# Patient Record
Sex: Male | Born: 1950 | Race: Black or African American | Hispanic: No | Marital: Married | Smoking: Former smoker
Health system: Southern US, Community
[De-identification: ages and names within clinical notes are randomized; demographics above are authoritative.]

## PROBLEM LIST (undated history)

## (undated) DIAGNOSIS — J849 Interstitial pulmonary disease, unspecified: Secondary | ICD-10-CM

## (undated) DIAGNOSIS — N529 Male erectile dysfunction, unspecified: Secondary | ICD-10-CM

## (undated) DIAGNOSIS — I10 Essential (primary) hypertension: Secondary | ICD-10-CM

## (undated) DIAGNOSIS — I255 Ischemic cardiomyopathy: Secondary | ICD-10-CM

## (undated) DIAGNOSIS — E7800 Pure hypercholesterolemia, unspecified: Secondary | ICD-10-CM

## (undated) DIAGNOSIS — K219 Gastro-esophageal reflux disease without esophagitis: Secondary | ICD-10-CM

## (undated) DIAGNOSIS — Z95810 Presence of automatic (implantable) cardiac defibrillator: Secondary | ICD-10-CM

## (undated) DIAGNOSIS — I5020 Unspecified systolic (congestive) heart failure: Secondary | ICD-10-CM

## (undated) DIAGNOSIS — I82409 Acute embolism and thrombosis of unspecified deep veins of unspecified lower extremity: Secondary | ICD-10-CM

## (undated) DIAGNOSIS — J45909 Unspecified asthma, uncomplicated: Secondary | ICD-10-CM

## (undated) DIAGNOSIS — I241 Dressler's syndrome: Secondary | ICD-10-CM

## (undated) DIAGNOSIS — I639 Cerebral infarction, unspecified: Secondary | ICD-10-CM

## (undated) DIAGNOSIS — I509 Heart failure, unspecified: Secondary | ICD-10-CM

## (undated) DIAGNOSIS — I6521 Occlusion and stenosis of right carotid artery: Secondary | ICD-10-CM

## (undated) DIAGNOSIS — R011 Cardiac murmur, unspecified: Secondary | ICD-10-CM

## (undated) DIAGNOSIS — F17200 Nicotine dependence, unspecified, uncomplicated: Secondary | ICD-10-CM

## (undated) DIAGNOSIS — I219 Acute myocardial infarction, unspecified: Secondary | ICD-10-CM

## (undated) DIAGNOSIS — I4891 Unspecified atrial fibrillation: Secondary | ICD-10-CM

## (undated) DIAGNOSIS — M1990 Unspecified osteoarthritis, unspecified site: Secondary | ICD-10-CM

## (undated) DIAGNOSIS — I739 Peripheral vascular disease, unspecified: Secondary | ICD-10-CM

## (undated) DIAGNOSIS — J189 Pneumonia, unspecified organism: Secondary | ICD-10-CM

## (undated) DIAGNOSIS — M109 Gout, unspecified: Secondary | ICD-10-CM

## (undated) DIAGNOSIS — N189 Chronic kidney disease, unspecified: Secondary | ICD-10-CM

## (undated) DIAGNOSIS — I2510 Atherosclerotic heart disease of native coronary artery without angina pectoris: Secondary | ICD-10-CM

## (undated) HISTORY — DX: Cerebral infarction, unspecified: I63.9

## (undated) HISTORY — DX: Heart failure, unspecified: I50.9

## (undated) HISTORY — DX: Nicotine dependence, unspecified, uncomplicated: F17.200

## (undated) HISTORY — DX: Unspecified atrial fibrillation: I48.91

## (undated) HISTORY — DX: Gastro-esophageal reflux disease without esophagitis: K21.9

## (undated) HISTORY — PX: CORONARY ANGIOPLASTY WITH STENT PLACEMENT: SHX49

## (undated) HISTORY — DX: Male erectile dysfunction, unspecified: N52.9

## (undated) HISTORY — DX: Acute embolism and thrombosis of unspecified deep veins of unspecified lower extremity: I82.409

## (undated) HISTORY — DX: Chronic kidney disease, unspecified: N18.9

## (undated) HISTORY — DX: Essential (primary) hypertension: I10

## (undated) HISTORY — DX: Interstitial pulmonary disease, unspecified: J84.9

## (undated) HISTORY — DX: Ischemic cardiomyopathy: I25.5

## (undated) HISTORY — DX: Atherosclerotic heart disease of native coronary artery without angina pectoris: I25.10

## (undated) HISTORY — DX: Peripheral vascular disease, unspecified: I73.9

---
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
## 1998-02-20 ENCOUNTER — Ambulatory Visit (HOSPITAL_COMMUNITY): Admission: RE | Admit: 1998-02-20 | Discharge: 1998-02-20 | Payer: Self-pay | Admitting: Cardiology

## 2001-01-05 ENCOUNTER — Ambulatory Visit (HOSPITAL_COMMUNITY): Admission: RE | Admit: 2001-01-05 | Discharge: 2001-01-05 | Payer: Self-pay | Admitting: *Deleted

## 2001-01-05 ENCOUNTER — Encounter: Payer: Self-pay | Admitting: *Deleted

## 2003-05-08 ENCOUNTER — Ambulatory Visit (HOSPITAL_COMMUNITY): Admission: RE | Admit: 2003-05-08 | Discharge: 2003-05-08 | Payer: Self-pay | Admitting: Family Medicine

## 2003-05-08 ENCOUNTER — Encounter: Payer: Self-pay | Admitting: Family Medicine

## 2003-05-15 ENCOUNTER — Ambulatory Visit (HOSPITAL_COMMUNITY): Admission: RE | Admit: 2003-05-15 | Discharge: 2003-05-15 | Payer: Self-pay | Admitting: Family Medicine

## 2003-05-15 ENCOUNTER — Encounter: Payer: Self-pay | Admitting: Family Medicine

## 2003-05-23 ENCOUNTER — Ambulatory Visit (HOSPITAL_COMMUNITY): Admission: RE | Admit: 2003-05-23 | Discharge: 2003-05-23 | Payer: Self-pay | Admitting: *Deleted

## 2003-05-30 ENCOUNTER — Encounter: Payer: Self-pay | Admitting: *Deleted

## 2005-09-12 ENCOUNTER — Ambulatory Visit (HOSPITAL_COMMUNITY): Admission: RE | Admit: 2005-09-12 | Discharge: 2005-09-12 | Payer: Self-pay | Admitting: Family Medicine

## 2005-09-15 HISTORY — PX: ANTERIOR CERVICAL DECOMP/DISCECTOMY FUSION: SHX1161

## 2005-09-22 ENCOUNTER — Ambulatory Visit (HOSPITAL_COMMUNITY): Admission: RE | Admit: 2005-09-22 | Discharge: 2005-09-22 | Payer: Self-pay | Admitting: Neurosurgery

## 2005-09-30 ENCOUNTER — Ambulatory Visit: Payer: Self-pay | Admitting: *Deleted

## 2005-10-08 ENCOUNTER — Encounter (HOSPITAL_COMMUNITY): Admission: RE | Admit: 2005-10-08 | Discharge: 2005-11-07 | Payer: Self-pay | Admitting: *Deleted

## 2005-10-08 ENCOUNTER — Ambulatory Visit: Payer: Self-pay | Admitting: *Deleted

## 2005-10-13 ENCOUNTER — Ambulatory Visit: Payer: Self-pay | Admitting: *Deleted

## 2005-10-27 ENCOUNTER — Ambulatory Visit: Payer: Self-pay | Admitting: *Deleted

## 2005-11-04 ENCOUNTER — Inpatient Hospital Stay (HOSPITAL_COMMUNITY): Admission: RE | Admit: 2005-11-04 | Discharge: 2005-11-05 | Payer: Self-pay | Admitting: Neurosurgery

## 2006-03-20 ENCOUNTER — Ambulatory Visit (HOSPITAL_COMMUNITY): Admission: RE | Admit: 2006-03-20 | Discharge: 2006-03-20 | Payer: Self-pay | Admitting: Family Medicine

## 2006-06-08 ENCOUNTER — Ambulatory Visit: Payer: Self-pay | Admitting: Internal Medicine

## 2006-06-30 ENCOUNTER — Ambulatory Visit (HOSPITAL_COMMUNITY): Admission: RE | Admit: 2006-06-30 | Discharge: 2006-06-30 | Payer: Self-pay | Admitting: Neurosurgery

## 2006-07-24 ENCOUNTER — Ambulatory Visit: Payer: Self-pay | Admitting: Internal Medicine

## 2006-08-31 ENCOUNTER — Ambulatory Visit: Payer: Self-pay | Admitting: Internal Medicine

## 2006-10-02 ENCOUNTER — Ambulatory Visit: Payer: Self-pay | Admitting: Internal Medicine

## 2006-11-05 ENCOUNTER — Ambulatory Visit: Payer: Self-pay | Admitting: Internal Medicine

## 2007-02-05 ENCOUNTER — Ambulatory Visit: Payer: Self-pay | Admitting: Internal Medicine

## 2007-04-27 ENCOUNTER — Encounter: Admission: RE | Admit: 2007-04-27 | Discharge: 2007-04-27 | Payer: Self-pay | Admitting: Neurosurgery

## 2007-05-10 ENCOUNTER — Ambulatory Visit: Payer: Self-pay | Admitting: Internal Medicine

## 2008-02-28 ENCOUNTER — Encounter: Payer: Self-pay | Admitting: Cardiology

## 2008-06-09 ENCOUNTER — Ambulatory Visit: Payer: Self-pay | Admitting: Cardiology

## 2008-07-06 ENCOUNTER — Ambulatory Visit: Payer: Self-pay | Admitting: Cardiology

## 2008-07-07 ENCOUNTER — Ambulatory Visit (HOSPITAL_COMMUNITY): Admission: RE | Admit: 2008-07-07 | Discharge: 2008-07-07 | Payer: Self-pay | Admitting: General Surgery

## 2008-08-28 ENCOUNTER — Ambulatory Visit: Payer: Self-pay | Admitting: Cardiology

## 2008-11-27 ENCOUNTER — Ambulatory Visit: Payer: Self-pay | Admitting: Cardiology

## 2009-07-12 ENCOUNTER — Encounter: Payer: Self-pay | Admitting: Cardiology

## 2009-07-12 ENCOUNTER — Telehealth (INDEPENDENT_AMBULATORY_CARE_PROVIDER_SITE_OTHER): Payer: Self-pay

## 2009-07-12 DIAGNOSIS — E785 Hyperlipidemia, unspecified: Secondary | ICD-10-CM

## 2009-07-16 ENCOUNTER — Encounter (INDEPENDENT_AMBULATORY_CARE_PROVIDER_SITE_OTHER): Payer: Self-pay | Admitting: *Deleted

## 2009-07-16 LAB — CONVERTED CEMR LAB
ALT: 16 units/L
AST: 19 units/L
Albumin: 3.3 g/dL
Alkaline Phosphatase: 97 units/L
BUN: 10 mg/dL
CO2: 27 meq/L
Calcium: 9.3 mg/dL
Chloride: 105 meq/L
Creatinine, Ser: 1.25 mg/dL
GFR calc non Af Amer: 59 mL/min
Glomerular Filtration Rate, Af Am: >60 mL/min/{1.73_m2}
Glucose, Bld: 98 mg/dL
Potassium: 3.9 meq/L
Sodium: 139 meq/L
Total Protein: 7.3 g/dL

## 2009-07-18 ENCOUNTER — Encounter (INDEPENDENT_AMBULATORY_CARE_PROVIDER_SITE_OTHER): Payer: Self-pay | Admitting: *Deleted

## 2009-09-15 HISTORY — PX: CARDIAC DEFIBRILLATOR PLACEMENT: SHX171

## 2009-09-28 DIAGNOSIS — N529 Male erectile dysfunction, unspecified: Secondary | ICD-10-CM

## 2009-09-28 DIAGNOSIS — I219 Acute myocardial infarction, unspecified: Secondary | ICD-10-CM | POA: Insufficient documentation

## 2009-09-28 DIAGNOSIS — I519 Heart disease, unspecified: Secondary | ICD-10-CM

## 2009-09-28 DIAGNOSIS — N19 Unspecified kidney failure: Secondary | ICD-10-CM | POA: Insufficient documentation

## 2009-10-01 ENCOUNTER — Ambulatory Visit: Payer: Self-pay | Admitting: Cardiology

## 2009-10-01 DIAGNOSIS — I2510 Atherosclerotic heart disease of native coronary artery without angina pectoris: Secondary | ICD-10-CM | POA: Insufficient documentation

## 2009-10-01 DIAGNOSIS — I10 Essential (primary) hypertension: Secondary | ICD-10-CM

## 2009-10-01 DIAGNOSIS — I714 Abdominal aortic aneurysm, without rupture, unspecified: Secondary | ICD-10-CM | POA: Insufficient documentation

## 2009-10-10 ENCOUNTER — Encounter: Payer: Self-pay | Admitting: Cardiology

## 2009-10-10 ENCOUNTER — Ambulatory Visit: Payer: Self-pay | Admitting: Cardiology

## 2009-10-26 ENCOUNTER — Encounter: Payer: Self-pay | Admitting: Cardiology

## 2009-10-29 ENCOUNTER — Encounter: Payer: Self-pay | Admitting: Cardiology

## 2009-10-30 ENCOUNTER — Encounter: Payer: Self-pay | Admitting: Cardiology

## 2009-11-05 ENCOUNTER — Encounter: Payer: Self-pay | Admitting: Cardiology

## 2009-11-14 ENCOUNTER — Encounter: Payer: Self-pay | Admitting: Cardiology

## 2009-12-03 ENCOUNTER — Ambulatory Visit: Payer: Self-pay | Admitting: Internal Medicine

## 2009-12-03 DIAGNOSIS — I2589 Other forms of chronic ischemic heart disease: Secondary | ICD-10-CM

## 2009-12-03 LAB — CONVERTED CEMR LAB
BUN: 11 mg/dL (ref 6–23)
CO2: 25 meq/L (ref 19–32)
Chloride: 106 meq/L (ref 96–112)
Eosinophils Relative: 2.6 % (ref 0.0–5.0)
Lymphocytes Relative: 41.1 % (ref 12.0–46.0)
Lymphs Abs: 3 10*3/uL (ref 0.7–4.0)
Monocytes Absolute: 1.3 10*3/uL — ABNORMAL HIGH (ref 0.1–1.0)
Monocytes Relative: 18.1 % — ABNORMAL HIGH (ref 3.0–12.0)
Neutro Abs: 2.7 10*3/uL (ref 1.4–7.7)
Neutrophils Relative %: 37.9 % — ABNORMAL LOW (ref 43.0–77.0)
Prothrombin Time: 10.5 s (ref 9.1–11.7)
Sodium: 138 meq/L (ref 135–145)
aPTT: 28.2 s (ref 21.7–28.8)

## 2009-12-10 ENCOUNTER — Ambulatory Visit: Payer: Self-pay | Admitting: Internal Medicine

## 2009-12-10 ENCOUNTER — Telehealth (INDEPENDENT_AMBULATORY_CARE_PROVIDER_SITE_OTHER): Payer: Self-pay | Admitting: Physician Assistant

## 2009-12-10 ENCOUNTER — Inpatient Hospital Stay (HOSPITAL_COMMUNITY): Admission: RE | Admit: 2009-12-10 | Discharge: 2009-12-11 | Payer: Self-pay | Admitting: Internal Medicine

## 2009-12-11 ENCOUNTER — Encounter: Payer: Self-pay | Admitting: Internal Medicine

## 2009-12-17 ENCOUNTER — Telehealth (INDEPENDENT_AMBULATORY_CARE_PROVIDER_SITE_OTHER): Payer: Self-pay | Admitting: *Deleted

## 2009-12-20 ENCOUNTER — Encounter: Payer: Self-pay | Admitting: Internal Medicine

## 2009-12-24 ENCOUNTER — Ambulatory Visit: Payer: Self-pay | Admitting: Internal Medicine

## 2009-12-24 ENCOUNTER — Ambulatory Visit: Payer: Self-pay

## 2009-12-24 ENCOUNTER — Encounter: Payer: Self-pay | Admitting: Internal Medicine

## 2010-01-24 ENCOUNTER — Ambulatory Visit: Payer: Self-pay | Admitting: Cardiology

## 2010-01-24 DIAGNOSIS — Z95810 Presence of automatic (implantable) cardiac defibrillator: Secondary | ICD-10-CM

## 2010-03-15 ENCOUNTER — Ambulatory Visit: Payer: Self-pay | Admitting: Internal Medicine

## 2010-03-15 DIAGNOSIS — F17200 Nicotine dependence, unspecified, uncomplicated: Secondary | ICD-10-CM

## 2010-05-17 ENCOUNTER — Telehealth (INDEPENDENT_AMBULATORY_CARE_PROVIDER_SITE_OTHER): Payer: Self-pay | Admitting: *Deleted

## 2010-07-05 ENCOUNTER — Ambulatory Visit: Payer: Self-pay | Admitting: Internal Medicine

## 2010-08-06 ENCOUNTER — Ambulatory Visit: Payer: Self-pay | Admitting: Cardiology

## 2010-09-04 ENCOUNTER — Encounter: Payer: Self-pay | Admitting: Cardiology

## 2010-09-13 ENCOUNTER — Encounter: Payer: Self-pay | Admitting: Cardiology

## 2010-10-09 ENCOUNTER — Ambulatory Visit
Admission: RE | Admit: 2010-10-09 | Discharge: 2010-10-09 | Payer: Self-pay | Source: Home / Self Care | Attending: Internal Medicine | Admitting: Internal Medicine

## 2010-10-10 ENCOUNTER — Encounter: Payer: Self-pay | Admitting: Internal Medicine

## 2010-10-15 NOTE — Assessment & Plan Note (Signed)
Summary: EPH D/C CONE 12-11-2009   Visit Type:  hospital follow-up Primary Provider:  [name], MD  CC:  hospital follow-up visit.  History of Present Illness: the patient is a 60 year old male with history of ischemic cardiomyopathy. His recent ejection fraction was measured at 30-35%. He was referred for defibrillator implant. He is status post ICD now. He had no complications. Prior to thishe was evaluated for complex renal cyst with MRI MRA of the abdomen. He reports no recurrent chest pain. He has been started on spironolactone and has not developed hyperkalemia. His creatinine is stable. His NYHA class II. He denies any chest pain shortness of breath at rest orthopnea or PND. He is doing quite well from a cardiovascular standpoint. He cut down his smoking to approximate 6 cigarettes a day.  Preventive Screening-Counseling & Management  Alcohol-Tobacco     Smoking Status: current     Smoking Cessation Counseling: yes     Packs/Day: <1PPD  Current Medications (verified): 1)  Toprol Xl 50 Mg Xr24h-Tab (Metoprolol Succinate) .... Take 1 Tablet By Mouth Once A Day 2)  Fish Oil 1000 Mg Caps (Omega-3 Fatty Acids) .... Take 1 Tablet By Mouth Once A Day 3)  Aspir-Low 81 Mg Tbec (Aspirin) .... Take 1 Tablet By Mouth Once A Day 4)  Lisinopril 20 Mg Tabs (Lisinopril) .... Take 1 Tablet By Mouth Once A Day 5)  Simvastatin 40 Mg Tabs (Simvastatin) .... Take One Tablet By Mouth Daily At Bedtime 6)  Pantoprazole Sodium 40 Mg Tbec (Pantoprazole Sodium) .... Take 1 Tablet By Mouth Once A Day 7)  Spironolactone 25 Mg Tabs (Spironolactone) .... Take 1 Tablet By Mouth Daily  Allergies (verified): 1)  ! Naprosyn  Comments:  Nurse/Medical Assistant: The patient's medications and allergies were reviewed with the patient and were updated in the Medication and Allergy Lists. Bottles reviewed.  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  Father died of MI.  Brother with CAD.  He denies FH of sudden death.  Social History: Last updated: 12/31/2009 Retired and lives in [place].  Previously worked for [employer].  Tobacco Use - 1PPD x 40+ years ETOH- rare Drugs- occasional marijuana   Risk Factors: Smoking Status: current (01/24/2010) Packs/Day: <1PPD (01/24/2010)  Social History: Packs/Day:  <1PPD  Review of Systems  The patient denies fatigue, malaise, fever, weight gain/loss, vision loss, decreased hearing, hoarseness, chest pain, palpitations, shortness of breath, prolonged cough, wheezing, sleep apnea, coughing up blood, abdominal pain, blood in stool, nausea, vomiting, diarrhea, heartburn, incontinence, blood in urine, muscle weakness, joint pain, leg swelling, rash, skin lesions, headache, fainting, dizziness, depression, anxiety, enlarged lymph nodes, easy bruising or bleeding, and environmental allergies.    Vital Signs:  Patient profile:   60 year old male Height:      69 inches Weight:      212 pounds O2 Sat:      97 % Pulse rate:   96 / minute BP sitting:   133 / 88  (left arm) Cuff size:   large  Vitals Entered By: Carlye Grippe (Jan 24, 2010 8:28 AM)  Serial Vital Signs/Assessments:  Time      Position  BP       Pulse  Resp  Temp     By 8:58 AM             126/85   98                    Hoover Brunette, LPN  CC: hospital follow-up visit   Physical Exam  Additional Exam:  General: Well-developed, well-nourished in no distress head: Normocephalic and atraumatic eyes PERRLA/EOMI intact, conjunctiva and lids normal nose: No deformity or lesions mouth  normal dentition, normal posterior pharynx neck: Supple, no JVD.  No masses, thyromegaly or abnormal cervical nodes lungs: Normal breath sounds bilaterally without wheezing.  Normal percussion heart: regular rate and rhythm with normal S1 and S2, no S3 or S4.  PMI is normal.  No pathological murmurs abdomen: Normal bowel sounds, abdomen is soft and nontender without masses, organomegaly or hernias noted.  No hepatosplenomegaly. No pulsatile mass.  musculoskeletal: Back normal, normal gait muscle strength and tone normal pulsus: Pulse is normal in all 4 extremities Extremities: No peripheral pitting edema neurologic: Alert and oriented x 3 skin: Intact without lesions or rashes cervical nodes: No significant adenopathy psychologic: Normal affect      ICD Specifications Following MD:  Hillis Range, MD     ICD Vendor:  [employer]     ICD Model Number:  100     ICD Serial Number:  [id number] ICD DOI:  12/10/2009     ICD Implanting MD:  Hillis Range, MD  Lead 1:    Location: RA     DOI: 12/10/2009     Model #: [id number]     Serial #: [id number]     Status: active Lead 2:    Location: RV     DOI: 12/10/2009     Model #: [id number]     Serial #: [id number]     Status: active  Indications::  ICM   ICD Follow Up ICD Dependent:  No      Huston Foley Parameters Mode DDI     Lower Rate Limit:  40      Tachy Zones VF:  250     VT:  200     VT1:  170     Impression & Recommendations:  Problem # 1:  CARDIOMYOPATHY, ISCHEMIC (ICD-414.8) patient has an ischemic myopathy with ejection fraction of 30-35%. He status post prior myocardial infarction. Continue current medical therapy. His updated medication list for this problem includes:    Toprol Xl 50 Mg Xr24h-tab (Metoprolol succinate) .[name]... Take 1 tablet by mouth once a day    Aspir-low 81 Mg Tbec (Aspirin) .[name]... Take 1 tablet by mouth once a day    Lisinopril 20 Mg Tabs (Lisinopril) .[name]... Take 1 tablet by mouth once a day    Spironolactone 25 Mg Tabs  (Spironolactone) .[name]... Take 1 tablet by mouth daily  Problem # 2:  LEFT VENTRICULAR FUNCTION, DECREASED (ICD-429.2) lisinopril will be increased to 20 mg p.o. q. daily  Problem # 3:  CAD (ICD-414.00) Assessment: Comment Only  His updated medication list for this problem includes:    Toprol Xl 50 Mg Xr24h-tab (Metoprolol succinate) .[name]... Take 1 tablet by mouth once a day    Aspir-low 81 Mg Tbec (Aspirin) .[name]... Take 1 tablet by mouth once a day    Lisinopril 20 Mg Tabs (Lisinopril) .[name]... Take 1 tablet by mouth once a day  Problem #  4:  RENAL FAILURE (ICD-586) the patient had an evaluation of complex renal cyst with MRI. No definite evidence of malignancy.  Problem # 5:  IMPLANTATION OF DEFIBRILLATOR, HX OF (ICD-V45.02) the patient had a recent implant of a defibrillator. This was on 4/28. No complications have been noted.  Patient Instructions: 1)  Increase Lisinopril 20mg  daily  2)  Follow up in  6 months Prescriptions: LISINOPRIL 20 MG TABS (LISINOPRIL) Take 1 tablet by mouth once a day  #30 x 6   Entered by:   Hoover Brunette, LPN   Authorized by:   Lewayne Bunting, MD, [facility]   Signed by:   Hoover Brunette, LPN on 40/98/1191   Method used:   Electronically to        [facility]* (retail)       [address]       [place], [place]  [address]       Ph: [phone]       Fax: [phone]   RxID:   [id number]

## 2010-10-15 NOTE — Miscellaneous (Signed)
Summary: Orders Update - EP Referral  Clinical Lists Changes  Orders: Added new Referral order of EP Referral (Cardiology EP Ref ) - Signed

## 2010-10-15 NOTE — Assessment & Plan Note (Signed)
Summary: nep/ discuss possible icd placement/ ef 30-35 % /  pt has med...   Visit Type:  Initial Consult Primary Provider:  [name], MD   History of Present Illness: [name] is a pleasant 60 year old male with a history of coronary artery disease, status post prior myocardial infarction 05/1997.  He reports having PCI of the LAD in 1998.  He has had no further cardiac intervention since that itme.  The patient had a Cardiolite scan done1/2007 as part of a preoperative evaluation. There was no ischemia but his ejection fraction was 33%.  By EKG he has evidence of an old inferolateral wall myocardial infarction.  He reports doing very well recently.  He denies chest pain.  He does reports dypsnea with moderate activity.  He becomes SOB when walking 1 flight of steps or upon incline.  He smokes 15-18 cigarettes per day and has smoked more than 40 years.  He denies orthopnea, PND, edema, palpitations, presyncope, or syncope.   Current Medications (verified): 1)  Toprol Xl 50 Mg Xr24h-Tab (Metoprolol Succinate) .... Take 1 Tablet By Mouth Once A Day 2)  Fish Oil 1000 Mg Caps (Omega-3 Fatty Acids) .... Take 1 Tablet By Mouth Once A Day 3)  Aspir-Low 81 Mg Tbec (Aspirin) .... Take 1 Tablet By Mouth Once A Day 4)  Lisinopril 10 Mg Tabs (Lisinopril) .... Take 1 Tablet By Mouth Once A Day 5)  Viagra 100 Mg Tabs (Sildenafil Citrate) .... Take 1 Tablet By Mouth As Directed, Only One Tab Per Day 6)  Simvastatin 40 Mg Tabs (Simvastatin) .... Take One Tablet By Mouth Daily At Bedtime 7)  Pantoprazole Sodium 40 Mg Tbec (Pantoprazole Sodium) .... Take 1 Tablet By Mouth Once A Day 8)  Spironolactone 25 Mg Tabs (Spironolactone) .... Take 1 Tablet By Mouth Every Othe Day  Allergies (verified): 1)  ! Naprosyn  Past History:  Past Medical History: CAD s/p PCI after MI 1998 NYHA Class II/III CHF Ischemic CM (EF 30-35%) CRI renal cysts, recently evaluated by MRI HTN HL ERECTILE DYSFUNCTION,  ORGANIC (ICD-607.84) benign renal cyst (MRI reviewed from 2011)  Past Surgical History: cath (6/99) cervical surgery 2007  Family History: Father and mother both died in their 60s.  Father died of MI.  Brother with CAD.  He denies FH of sudden death.  Social History: Retired and lives in [place].  Previously worked for [employer].  Tobacco Use - 1PPD x 40+ years ETOH- rare Drugs- occasional marijuana   Review of Systems       All systems are reviewed and negative except as listed in the HPI.   also prior hemorrhoids  Vital Signs:  Patient profile:   60 year old male Height:      69 inches Weight:      217 pounds BMI:     32.16 Pulse rate:   92 / minute BP sitting:   136 / 80  (left arm)  Vitals Entered By: Laurance Flatten CMA (December 03, 2009 11:25 AM)  Physical Exam  General:  Well developed, well nourished, in no acute distress. Head:  normocephalic and atraumatic Eyes:  PERRLA/EOM intact; conjunctiva and lids normal. Mouth:  Teeth, gums and palate normal. Oral mucosa normal. Neck:  Neck supple, no JVD. No masses, thyromegaly or abnormal cervical nodes. Lungs:  Clear bilaterally to auscultation and percussion. Heart:  Non-displaced PMI, chest non-tender; regular rate and rhythm, S1, S2 without murmurs, rubs or gallops. Carotid upstroke normal, no bruit. Normal abdominal aortic  size, no bruits. Femorals normal pulses, no bruits. Pedals normal pulses. No edema, no varicosities. Abdomen:  Bowel sounds positive; abdomen soft and non-tender without masses, organomegaly, or hernias noted. No hepatosplenomegaly. Msk:  Back normal, normal gait. Muscle strength and tone normal. Pulses:  pulses normal in all 4 extremities Extremities:  No clubbing or cyanosis. Neurologic:  Alert and oriented x 3. Skin:  Intact without lesions or rashes. Cervical Nodes:  no significant adenopathy Psych:  Normal affect.   EKG  Procedure date:  12/03/2009  Findings:      sinus rhythm, PR  206, QRS 90, inferior infarct, anterolateral infarct  EKG  Procedure date:  10/10/2009  Findings:      EF 30-35%,  inferior HK, posterior and apical HK.  nl LV size  Comments:      from [facility] office  Impression & Recommendations:  Problem # 1:  CARDIOMYOPATHY, ISCHEMIC (ICD-414.8) The patient has an ischemic CM (EF 30-35%), NYHA Class II/III CHF, and CAD.  He meets MADIT II/ SCD-HeFT criteria for ICD implantation for primary prevention of sudden death.  Risks, benefits, alternatives to ICD implantation were discussed in detail with the patient today.  He understands that the risks include but are not limited to bleeding, infection, pneumothorax, perforation, tamponade, vascular damage, renal failure, MI, stroke, death, inappropriate shocks, and lead dislodgement.  The patient accepts these risks and wishes to proceed.  I have asked our research division to speak with the patient about possible enrollment in MADIT RIT.  Orders: TLB-BMP (Basic Metabolic Panel-BMET) (80048-METABOL) TLB-CBC Platelet - w/Differential (85025-CBCD) TLB-PT (Protime) (85610-PTP) TLB-PTT (85730-PTTL)  Problem # 2:  ESSENTIAL HYPERTENSION, BENIGN (ICD-401.1) stable no changes today  His updated medication list for this problem includes:    Toprol Xl 50 Mg Xr24h-tab (Metoprolol succinate) .[name]... Take 1 tablet by mouth once a day    Aspir-low 81 Mg Tbec (Aspirin) .[name]... Take 1 tablet by mouth once a day    Lisinopril 10 Mg Tabs (Lisinopril) .[name]... Take 1 tablet by mouth once a day    Spironolactone 25 Mg Tabs (Spironolactone) .[name]... Take 1 tablet by mouth every othe day  Problem # 3:  CAD (ICD-414.00) no symtoms of ischemia no changes  Problem # 4:  DYSLIPIDEMIA (ICD-272.4) stable no changes  Other Orders: EKG w/ Interpretation (93000)  Patient Instructions: 1)  Your physician has recommended that you have a defibrillator inserted.  An implantable cardioverter defibrillator (ICD) is a small device that is  placed in your chest or, in rare cases, your abdomen. This device uses electrical pulses or shocks to help control life-threatening, irregular heartbeats that could lead the heart to suddenly stop beating (sudden cardiac arrest). Leads are attached to the ICD that goes into your heart. This is done in the hospital and usually requires an overnight stay. Please see the instruction sheet given to you today for more information.

## 2010-10-15 NOTE — Progress Notes (Signed)
  Phone Note Other Incoming   Caller: Pt daughter Action Taken: Systems developer Completed Summary of Call: Pt dtr wants to shave him, concerned about anticoag. Advised her baby ASA only. Initial call taken by: Park Breed PA-C,  December 10, 2009 6:13 PM

## 2010-10-15 NOTE — Progress Notes (Signed)
  Phone Note Call from Patient   Caller: Patient Reason for Call: Refill Medication Summary of Call: Pt called in to say that he was still having pain and only had 2 pain pills left.  I told pt I would let Dennis Bast, RN Dr Jenel Lucks nurse be aware.  Pt's phone number is [phone] Initial call taken by: Cyndia Diver RN   Follow-up for Phone Call        pt to try Tylenol now and will call back if this doesn't help. Dennis Bast, RN, BSN  December 17, 2009 1:25 PM

## 2010-10-15 NOTE — Procedures (Signed)
Summary: wch   Current Medications (verified): 1)  Toprol Xl 50 Mg Xr24h-Tab (Metoprolol Succinate) .... Take 1 Tablet By Mouth Once A Day 2)  Fish Oil 1000 Mg Caps (Omega-3 Fatty Acids) .... Take 1 Tablet By Mouth Once A Day 3)  Aspir-Low 81 Mg Tbec (Aspirin) .... Take 1 Tablet By Mouth Once A Day 4)  Lisinopril 10 Mg Tabs (Lisinopril) .... Take 1 Tablet By Mouth Once A Day 5)  Viagra 100 Mg Tabs (Sildenafil Citrate) .... Take 1 Tablet By Mouth As Directed, Only One Tab Per Day 6)  Simvastatin 40 Mg Tabs (Simvastatin) .... Take One Tablet By Mouth Daily At Bedtime 7)  Pantoprazole Sodium 40 Mg Tbec (Pantoprazole Sodium) .... Take 1 Tablet By Mouth Once A Day 8)  Spironolactone 25 Mg Tabs (Spironolactone) .... Take 1 Tablet By Mouth Every Othe Day  Allergies (verified): 1)  ! Naprosyn   ICD Specifications Following MD:  Hillis Range, MD     ICD Vendor:  [facility] [employer]     ICD Model Number:  100     ICD Serial Number:  [id number] ICD DOI:  12/10/2009     ICD Implanting MD:  Hillis Range, MD  Lead 1:    Location: RA     DOI: 12/10/2009     Model #: 4136     Serial #: [id number]     Status: active Lead 2:    Location: RV     DOI: 12/10/2009     Model #: 1914     Serial #: [id number]     Status: active  Indications::  ICM   ICD Follow Up Remote Check?  No Battery Voltage:  BOL V     Charge Time:  9.0 seconds     Battery Est. Longevity:  8.5 YEARS Underlying rhythm:  SR ICD Dependent:  No       ICD Device Measurements Atrium:  Amplitude: 8.2 mV, Impedance: 537 ohms, Threshold: 0.7 V at 0.4 msec Right Ventricle:  Amplitude: 25 mV, Impedance: 517 ohms, Threshold: 0.7 V at 0.4 msec Shock Impedance: 53 ohms   Episodes MS Episodes:  0     Shock:  0     ATP:  0     Nonsustained:  2     Atrial Pacing:  0%     Ventricular Pacing:  <1%  Brady Parameters Mode DDI     Lower Rate Limit:  40      Tachy Zones VF:  250     VT:  200     VT1:  170     Next Cardiology Appt Due:  03/15/2010 Tech  Comments:  Wound check appt.  Steri-strips already removed.  Wound without redness or edema.  Slight swelling in upper arm, not significant.  Pt advised if swelling worsened or if any redness occurs to call the office.  Device checked by industry as part of MADIT-RIT protocol.  ROV 3 months JA. Gypsy Balsam RN BSN  December 24, 2009 10:26 AM

## 2010-10-15 NOTE — Miscellaneous (Signed)
Summary: Device preload  Clinical Lists Changes  Observations: Added new observation of ICD INDICATN: ICM (12/20/2009 15:17) Added new observation of ICDLEADSTAT2: active (12/20/2009 15:17) Added new observation of ICDLEADSER2: 161096  (12/20/2009 15:17) Added new observation of ICDLEADMOD2: 0158  (12/20/2009 15:17) Added new observation of ICDLEADLOC2: RV  (12/20/2009 15:17) Added new observation of ICDLEADSTAT1: active  (12/20/2009 15:17) Added new observation of ICDLEADSER1: [id number]  (12/20/2009 15:17) Added new observation of ICDLEADMOD1: 4136  (12/20/2009 15:17) Added new observation of ICDLEADLOC1: RA  (12/20/2009 15:17) Added new observation of ICD IMP MD: Hillis Range, MD  (12/20/2009 15:17) Added new observation of ICDLEADDOI2: 12/10/2009  (12/20/2009 15:17) Added new observation of ICDLEADDOI1: 12/10/2009  (12/20/2009 15:17) Added new observation of ICD IMPL DTE: 12/10/2009  (12/20/2009 15:17) Added new observation of ICD SERL#: 147011  (12/20/2009 15:17) Added new observation of ICD MODL#: 100  (12/20/2009 15:17) Added new observation of ICDMANUFACTR: [employer]  (12/20/2009 15:17) Added new observation of ICD MD: Hillis Range, MD  (12/20/2009 15:17)       ICD Specifications Following MD:  Hillis Range, MD     ICD Vendor:  [facility] [employer]     ICD Model Number:  100     ICD Serial Number:  [id number] ICD DOI:  12/10/2009     ICD Implanting MD:  Hillis Range, MD  Lead 1:    Location: RA     DOI: 12/10/2009     Model #: 4136     Serial #: [id number]     Status: active Lead 2:    Location: RV     DOI: 12/10/2009     Model #: 8657     Serial #: [id number]     Status: active  Indications::  ICM

## 2010-10-15 NOTE — Assessment & Plan Note (Signed)
Summary: 6 mo fu per nov reminder- srs   Visit Type:  Follow-up Primary Provider:  Robbie Lis    History of Present Illness: the patient is a 60 year old male with history of ischemic cardiomyopathy. His recent ejection fraction was measured at 30-35%. He was referred for defibrillator implant. He is status post ICD now. No sob. No scp. ICD no diacharges.  Lipids lfts, cbc,  met.   Preventive Screening-Counseling & Management  Alcohol-Tobacco     Smoking Status: current     Smoking Cessation Counseling: yes     Packs/Day: <1/2 PPD  Current Medications (verified): 1)  Metoprolol Succinate 100 Mg Xr24h-Tab (Metoprolol Succinate) .... Take One Tablet By Mouth Daily 2)  Fish Oil 1000 Mg Caps (Omega-3 Fatty Acids) .... Take 1 Tablet By Mouth Once A Day 3)  Aspirin 325 Mg Tabs (Aspirin) .... Take 1 Tablet By Mouth Once A Day 4)  Lisinopril 20 Mg Tabs (Lisinopril) .... Take 1 Tablet By Mouth Once A Day 5)  Simvastatin 40 Mg Tabs (Simvastatin) .... Take One Tablet By Mouth Daily At Bedtime 6)  Pantoprazole Sodium 40 Mg Tbec (Pantoprazole Sodium) .... Take 1 Tablet By Mouth Once A Day 7)  Spironolactone 25 Mg Tabs (Spironolactone) .... Take 1 Tablet By Mouth Daily  Allergies (verified): 1)  ! Naprosyn  Comments:  Nurse/Medical Assistant: The patient's medication bottles and allergies were reviewed with the patient and were updated in the Medication and Allergy Lists.  Past History:  Past Medical History: Last updated: 23-Dec-2009 CAD s/p PCI after MI 1998 NYHA Class II/III CHF Ischemic CM (EF 30-35%) CRI renal cysts, recently evaluated by MRI HTN HL ERECTILE DYSFUNCTION, ORGANIC (ICD-607.84) benign renal cyst (MRI reviewed from 2011)  Past Surgical History: Last updated: 12-23-2009 cath (6/99) cervical surgery 2007  Family History: Last updated: 23-Dec-2009 Father and mother both died in their 81s.  Father died of MI.  Brother with CAD.  He denies FH of sudden  death.  Social History: Last updated: 03/15/2010 Retired and lives in [place].  Previously worked for [employer].  Tobacco Use - 1PPD x 40+ years,  has quit x 3 days ETOH- rare Drugs- occasional marijuana   Risk Factors: Smoking Status: current (08/06/2010) Packs/Day: <1/2 PPD (08/06/2010)  Social History: Packs/Day:  <1/2 PPD  Vital Signs:  Patient profile:   60 year old male Height:      69 inches Weight:      216 pounds Pulse rate:   79 / minute BP sitting:   130 / 84  (left arm) Cuff size:   regular  Vitals Entered By: Carlye Grippe (August 06, 2010 9:25 AM)  Physical Exam  Additional Exam:  General: Well-developed, well-nourished in no distress head: Normocephalic and atraumatic eyes PERRLA/EOMI intact, conjunctiva and lids normal nose: No deformity or lesions mouth normal dentition, normal posterior pharynx neck: Supple, no JVD.  No masses, thyromegaly or abnormal cervical nodes lungs: Normal breath sounds bilaterally without wheezing.  Normal percussion heart: regular rate and rhythm with normal S1 and S2, no S3 or S4.  PMI is normal.  No pathological murmurs abdomen: Normal bowel sounds, abdomen is soft and nontender without masses, organomegaly or hernias noted.  No hepatosplenomegaly. No pulsatile mass.  musculoskeletal: Back normal, normal gait muscle strength and tone normal pulsus: Pulse is normal in all 4 extremities Extremities: No peripheral pitting edema neurologic: Alert and oriented x 3 skin: Intact without lesions or rashes cervical nodes: No significant adenopathy psychologic: Normal affect  ICD Specifications Following MD:  Hillis Range, MD     ICD Vendor:  [facility] [employer]     ICD Model Number:  100     ICD Serial Number:  [id number] ICD DOI:  12/10/2009     ICD Implanting MD:  Hillis Range, MD  Lead 1:    Location: RA     DOI: 12/10/2009     Model #: 4136     Serial #: [id number]     Status: active Lead 2:    Location: RV     DOI:  12/10/2009     Model #: 1027     Serial #: [id number]     Status: active  Indications::  ICM   ICD Follow Up ICD Dependent:  No      Brady Parameters Mode DDI     Lower Rate Limit:  40      Tachy Zones VF:  250     VT:  200     VT1:  170     Impression & Recommendations:  Problem # 1:  IMPLANTATION OF DEFIBRILLATOR, HX OF (ICD-V45.02) the patient is status-post recent implantation of a defibrillator .  He reports no complications  Problem # 2:  CARDIOMYOPATHY, ISCHEMIC (ICD-414.8) the patient has no recurrent chest pain will continue with medical therapy His updated medication list for this problem includes:    Metoprolol Succinate 100 Mg Xr24h-tab (Metoprolol succinate) .[name]... Take one tablet by mouth daily    Aspirin 325 Mg Tabs (Aspirin) .[name]... Take 1 tablet by mouth once a day    Lisinopril 20 Mg Tabs (Lisinopril) .[name]... Take 1 tablet by mouth once a day    Spironolactone 25 Mg Tabs (Spironolactone) .[name]... Take 1 tablet by mouth daily  Problem # 3:  RENAL FAILURE (ICD-586) followed by the patient's primary care physician  Other Orders: T-Lipid Profile [phone]) T-Hepatic Function [phone]) T-CBC No Diff ([id number]) T-Basic Metabolic Panel ([id number])  Patient Instructions: 1)  Labs  2)  Follow up in  6 months

## 2010-10-15 NOTE — Progress Notes (Signed)
Summary: RX REFILL PROTONIX  Phone Note Call from Patient Call back at Home Phone [phone]   Caller: patient walk in Reason for Call: Refill Medication, Talk to Nurse Summary of Call: pantoprazple  40 mg  once daily        HE HAS 2 PILLS LEFT call in to [facility] pharmacy. (his wife works for [employer] and they get rx)  cell# [phone] if no one at home call this number Initial call taken by: Claudette Laws,  May 17, 2010 1:42 PM    Prescriptions: PANTOPRAZOLE SODIUM 40 MG TBEC (PANTOPRAZOLE SODIUM) Take 1 tablet by mouth once a day  #30 x 6   Entered by:   Carlye Grippe   Authorized by:   Lewayne Bunting, MD, [facility]   Signed by:   Carlye Grippe on 05/17/2010   Method used:   Faxed to ...       [facility] (retail)       [address]       [place], [place]  [address]       Ph: [phone]       Fax: [phone]   RxID:   [id number] PANTOPRAZOLE SODIUM 40 MG TBEC (PANTOPRAZOLE SODIUM) Take 1 tablet by mouth once a day  #30 x 6   Entered by:   Carlye Grippe   Authorized by:   Lewayne Bunting, MD, [facility]   Signed by:   Carlye Grippe on 05/17/2010   Method used:   Print then Give to Patient   RxID:   [id number]

## 2010-10-15 NOTE — Letter (Signed)
Summary: Implantable Device Instructions  Architectural technologist, Main Office  [address]   [place], [place] [address]   Phone: [phone]  Fax: [phone]      Implantable Device Instructions  You are scheduled for:   Implantable Cardioverter Defibrillator   on 12/10/09 with Dr. Johney Frame.  1.  Please arrive at the Short Stay Center at [facility] at 5:30am on the day of your procedure.  2.  Do not eat or drink after midnight  the night before your procedure.  3.  Complete lab work on 12/03/09.  The lab at [facility] is open from 8:30 AM to 1:30 PM and from 2:30 PM to 5:00 PM.  The lab at [facility] is open from 7:30 AM to 5:30 PM.  You do not have to be fasting.  4.  Plan for an overnight stay.  Bring your insurance cards and a list of your medications.  5.  Wash your chest and neck with antibacterial soap (any brand) the evening before and the morning of your procedure.  Rinse well.  6.  Education material received  ICD            *If you have ANY questions after you get home, please call the office [phone].  kelly Lanier,RN  *Every attempt is made to prevent procedures from being rescheduled.  Due to the nauture of Electrophysiology, rescheduling can happen.  The physician is always aware and directs the staff when this occurs.

## 2010-10-15 NOTE — Assessment & Plan Note (Signed)
Summary: 3 MO FU PER PAULA   Visit Type:  Follow-up Primary Provider:  [name], MD  CC:  no cardiology complaints.  History of Present Illness: The patient presents today for routine electrophysiology followup. He reports doing very well since last being seen in our clinic. The patient denies symptoms of palpitations, chest pain, shortness of breath, orthopnea, PND, lower extremity edema, dizziness, presyncope, syncope, or neurologic sequela. The patient is tolerating medications without difficulties and is otherwise without complaint today.   Current Medications (verified): 1)  Toprol Xl 50 Mg Xr24h-Tab (Metoprolol Succinate) .... Take 1 Tablet By Mouth Once A Day 2)  Fish Oil 1000 Mg Caps (Omega-3 Fatty Acids) .... Take 1 Tablet By Mouth Once A Day 3)  Aspir-Low 81 Mg Tbec (Aspirin) .... Take 1 Tablet By Mouth Once A Day 4)  Lisinopril 20 Mg Tabs (Lisinopril) .... Take 1 Tablet By Mouth Once A Day 5)  Simvastatin 40 Mg Tabs (Simvastatin) .... Take One Tablet By Mouth Daily At Bedtime 6)  Pantoprazole Sodium 40 Mg Tbec (Pantoprazole Sodium) .... Take 1 Tablet By Mouth Once A Day 7)  Spironolactone 25 Mg Tabs (Spironolactone) .... Take 1 Tablet By Mouth Daily 8)  Nicotine 21 Mg/24hr Pt24 (Nicotine) .... Apply 1 Patch Daily  Allergies (verified): 1)  ! Naprosyn  Past History:  Past Medical History: Reviewed history from 12/03/2009 and no changes required. 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 cath (6/99) cervical surgery 2007  Social History: Retired and lives in [place].  Previously worked for [employer].  Tobacco Use - 1PPD x 40+ years,  has quit x 3 days ETOH- rare Drugs- occasional marijuana   Vital  Signs:  Patient profile:   60 year old male Weight:      213 pounds Pulse rate:   81 / minute BP sitting:   148 / 87  (right arm)  Vitals Entered By: Dreama Saa, CNA (March 15, 2010 3:34 PM)  Physical Exam  General:  Well developed, well nourished, in no acute distress. Head:  normocephalic and atraumatic Eyes:  PERRLA/EOM intact; conjunctiva and lids normal. Mouth:  Teeth, gums and palate normal. Oral mucosa normal. Neck:  supple Chest Wall:  ICD pocket is well healed Lungs:  Clear bilaterally to auscultation and percussion. Heart:  Non-displaced PMI, chest non-tender; regular rate and rhythm, S1, S2 without murmurs, rubs or gallops. Carotid upstroke normal, no bruit. Normal abdominal aortic size, no bruits. Femorals normal pulses, no bruits. Pedals normal pulses. No edema, no varicosities. Abdomen:  Bowel sounds positive; abdomen soft and non-tender without masses, organomegaly, or hernias noted. No hepatosplenomegaly. Msk:  Back normal, normal gait. Muscle strength and tone normal. Pulses:  pulses normal in all 4 extremities Extremities:  No clubbing or cyanosis. Neurologic:  Alert and oriented x 3. Skin:  Intact without lesions or rashes. Psych:  Normal affect.    ICD Specifications Following MD:  Hillis Range, MD     ICD Vendor:  [facility] [employer]     ICD Model Number:  100     ICD Serial Number:  [id number] ICD DOI:  12/10/2009     ICD Implanting MD:  Hillis Range, MD  Lead 1:    Location: RA     DOI: 12/10/2009  Model #: [id number]     Serial #: [id number]     Status: active Lead 2:    Location: RV     DOI: 12/10/2009     Model #: [id number]     Serial #: [id number]     Status: active  Indications::  ICM   ICD Follow Up Remote Check?  No Battery Voltage:  BOL V     Charge Time:  8.5 seconds     Underlying rhythm:  SR ICD Dependent:  No       ICD Device Measurements Atrium:  Amplitude: 9.8 mV, Impedance: 589 ohms, Threshold: 0.9 V at 0.4 msec Right Ventricle:  Amplitude: 25 mV,  Impedance: 547 ohms, Threshold: 0.6 V at 0.4 msec Shock Impedance: 62 ohms   Episodes MS Episodes:  0     Shock:  0     ATP:  0     Nonsustained:  10     Atrial Pacing:  <1%     Ventricular Pacing:  <1%  Brady Parameters Mode DDI     Lower Rate Limit:  40      Tachy Zones VF:  250     VT:  200     VT1:  170     Next Cardiology Appt Due:  06/15/2010 Tech Comments:  Normal device function.  Checked by [employer].  RA and RV outputs decreased.  ROV 3 months clinic. Gypsy Balsam RN BSN  March 15, 2010 4:22 PM  MD Comments:  NSVT noted  Impression & Recommendations:  Problem # 1:  CARDIOMYOPATHY, ISCHEMIC (ICD-414.8) Chronic systolic dysfunction is stable He has NSVT on ICD interrogation today.  We will increase toprol XL to 100mg  daily. 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       [place], [place]  [address]       Ph: [phone]       Fax: [phone]   RxID:   [id number]

## 2010-10-15 NOTE — Assessment & Plan Note (Signed)
Summary: PACER CHECK   Current Medications (verified): 1)  Metoprolol Succinate 100 Mg Xr24h-Tab (Metoprolol Succinate) .... Take One Tablet By Mouth Daily 2)  Fish Oil 1000 Mg Caps (Omega-3 Fatty Acids) .... Take 1 Tablet By Mouth Once A Day 3)  Aspir-Low 81 Mg Tbec (Aspirin) .... Take 1 Tablet By Mouth Once A Day 4)  Lisinopril 20 Mg Tabs (Lisinopril) .... Take 1 Tablet By Mouth Once A Day 5)  Simvastatin 40 Mg Tabs (Simvastatin) .... Take One Tablet By Mouth Daily At Bedtime 6)  Pantoprazole Sodium 40 Mg Tbec (Pantoprazole Sodium) .... Take 1 Tablet By Mouth Once A Day 7)  Spironolactone 25 Mg Tabs (Spironolactone) .... Take 1 Tablet By Mouth Daily 8)  Nicotine 21 Mg/24hr Pt24 (Nicotine) .... Apply 1 Patch Daily  Allergies (verified): 1)  ! Naprosyn    ICD Specifications Following MD:  Hillis Range, MD     ICD Vendor:  [facility] [employer]     ICD Model Number:  100     ICD Serial Number:  [id number] ICD DOI:  12/10/2009     ICD Implanting MD:  Hillis Range, MD  Lead 1:    Location: RA     DOI: 12/10/2009     Model #: [id number]     Serial #: [id number]     Status: active Lead 2:    Location: RV     DOI: 12/10/2009     Model #: [id number]     Serial #: [id number]     Status: active  Indications::  ICM   ICD Follow Up Battery Voltage:  BOL V     Charge Time:  8.6 seconds     Battery Est. Longevity:  8.5 YRS Underlying rhythm:  SR ICD Dependent:  No       ICD Device Measurements Atrium:  Amplitude: 10.9 mV, Impedance: 575 ohms, Threshold: 0.5 V at 0.4 msec Right Ventricle:  Amplitude: 25.0 mV, Impedance: 530 ohms, Threshold: 0.7 V at 0.4 msec Shock Impedance: 64 ohms   Episodes MS Episodes:  0     Shock:  0     ATP:  0     Nonsustained:  9     Atrial Therapies:  0 Atrial Pacing:  <1%     Ventricular Pacing:  <1%  Brady Parameters Mode DDI     Lower Rate Limit:  40      Tachy Zones VF:  250     VT:  200     VT1:  170     Next Cardiology Appt Due:  09/16/2010 Tech Comments:  RESEARCH PT---9 NST  EPISODES.  PT WALKS ON AVERAGE 4 MILES PER DAY.  NORMAL DEVICE FUNCTION. NO CHANGES MADE. ROV IN 3 MTHS W/DEVICE CLINIC. Vella Kohler  July 05, 2010 10:23 AM

## 2010-10-15 NOTE — Miscellaneous (Signed)
Summary: Orders Update - BMET   Clinical Lists Changes  Orders: Added new Test order of T-Basic Metabolic Panel ([id number]) - Signed 

## 2010-10-15 NOTE — Cardiovascular Report (Signed)
Summary: Office Visit   Office Visit   Imported By: Roderic Ovens 01/03/2010 13:45:08  _____________________________________________________________________  External Attachment:    Type:   Image     Comment:   External Document

## 2010-10-15 NOTE — Miscellaneous (Signed)
Summary: Orders Update- MRI  Clinical Lists Changes  Orders: Added new Referral order of MRI with & without contrast (MRI w/& w/o contrast) - Signed

## 2010-10-15 NOTE — Cardiovascular Report (Signed)
Summary: Card Device Clinic/ [facility]  Card Device Clinic/ [facility]   Imported By: Dorise Hiss 03/20/2010 15:22:13  _____________________________________________________________________  External Attachment:    Type:   Image     Comment:   External Document

## 2010-10-15 NOTE — Cardiovascular Report (Signed)
Summary: Pre Op Orders  Pre Op Orders   Imported By: Roderic Ovens 12/20/2009 13:31:28  _____________________________________________________________________  External Attachment:    Type:   Image     Comment:   External Document

## 2010-10-15 NOTE — Assessment & Plan Note (Signed)
Summary: EST-TRANSFER FROM [place]   Visit Type:  Follow-up Primary Provider:  [name], MD  CC:  follow-up visit.  History of Present Illness: the patient is a pleasant 60 year old male with a history of coronary artery disease, status post prior myocardial infarction. The patient had a Cardiolite scan done several years ago as part of a preoperative evaluation. There was no ischemia but his ejection fraction was 33%. By EKG he has evidence of an old inferolateral wall myocardial infarction. Is currently in NYHA class II. He denies any chest pain he does have shortness of breath on moderate exertion. He has no orthopnea or PND no palpitations or syncope. He does report ongoing erectile dysfunction. 8 days ago he reports an episode of brief palpitations but has had no recurrence. The patient has remained compliance was reduced rise, aspirin and red yeast rice. Unfortunate patient could not tolerate the niacin that was prescribed previously. Of note is that the patient is status post angioplasty and stent placement of the LAD, performed in 2007. He has evidence of an inferolateral wall myocardial infarction as well as anterior wall myocardial infarction by EKG. We have no recent evaluation of ejection fraction. Based on a very abnormal EKG I suspect the patient still has significant LV dysfunction.  Clinical Review Panels:  Lab Work BUN 10 (07/16/2009) Creatinine 1.25 (07/16/2009)  Carotid Studies Carotid Doppler Results Service Report Text  ACCESSION:  [id number]  REPORT:  CLINICAL DATA:  83-YEAR-OLD WITH CAROTID BRUITS.  US CAROTID DUPLEX BILATERAL  STANDARD CAROTID DUPLEX EXAMINATION WAS PERFORMED WITH   GRAY SCALE, COLOR FLOW AND SPECTRAL DOPPLER IMAGING.   THERE IS MODERATE PLAQUE INVOLVING BOTH CAROTID SYSTEMS   BUT THE PEAK SYSTOLIC VELOCITIES ARE WITHIN THE NORMAL   RANGE WITHOUT EVIDENCE OF FLOW LIMITING STENOSIS.  THE   VERTEBRAL ARTERIES DEMONSTRATE NORMAL  ANTEGRADE FLOW.  IMPRESSION:  1.  MODERATE PLAQUE INVOLVING BOTH CAROTID   SYSTEMS BUT NO FLOW-LIMITING STENOSIS.  2.  NORMAL ANTEGRADE FLOW IN BOTH VERTEBRAL ARTERIES.  TRANSCRIBED DATE:  [facility]  AS       05/15/2003 1:51 pm                                           Read By:  Cyndie Chime, M.D.                                           Released By:  Orlene Plum, M.D. (05/15/2003)    Preventive Screening-Counseling & Management  Alcohol-Tobacco     Smoking Status: current     Packs/Day: 0.75     Year Started: 1968  Current Medications (verified): 1)  Toprol Xl 50 Mg Xr24h-Tab (Metoprolol Succinate) .... Take 1 Tablet By Mouth Once A Day 2)  Fish Oil 1000 Mg Caps (Omega-3 Fatty Acids) .... Take 1 Tablet By Mouth Once A Day 3)  Aspir-Low 81 Mg Tbec (Aspirin) .... Take 1 Tablet By Mouth Once A Day 4)  Lisinopril 10 Mg Tabs (Lisinopril) .... Take 1 Tablet By Mouth Once A Day 5)  Viagra 100 Mg Tabs (Sildenafil Citrate) .... Take 1 Tablet By Mouth As Directed, Only One Tab Per Day 6)  Simvastatin 40 Mg Tabs (Simvastatin) .... Take One Tablet By Mouth  Daily At Bedtime 7)  Red Yeast Rice 600 Mg Caps (Red Yeast Rice Extract) .... Take 1 Capsule By Mouth Once A Day 8)  Pantoprazole Sodium 40 Mg Tbec (Pantoprazole Sodium) .... Take 1 Tablet By Mouth Once A Day  Allergies: No Known Drug Allergies  Comments:  Nurse/Medical Assistant: The patient's medications were reviewed with the patient and were updated in the Medication List. Pt ran out of meds. He states he has been without all of his medications for about 8 days now.   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 Retired  Tobacco Use - Yes.  Smoking Status:  current Packs/Day:  0.75  Review of Systems  The patient denies fatigue, malaise, fever, weight gain/loss, vision loss, decreased hearing, hoarseness, chest pain, palpitations, shortness of breath, prolonged cough, wheezing, sleep apnea, coughing up blood, abdominal pain, blood in stool, nausea, vomiting, diarrhea, heartburn, incontinence, blood in urine, muscle weakness, joint pain, leg swelling, rash, skin lesions, headache, fainting, dizziness, depression, anxiety, enlarged lymph nodes, easy bruising or bleeding, and environmental allergies.    Vital Signs:  Patient profile:   60 year old male Height:      69 inches Weight:      217 pounds BMI:     32.16 Pulse rate:   83 / minute BP sitting:   151 / 98  (left arm) Cuff size:   regular  Vitals Entered By: Cyril Loosen, RN, BSN (October 01, 2009 10:07 AM)  Nutrition Counseling: Patient's BMI is greater than 25 and therefore counseled on weight management options. CC: follow-up visit Comments Pt is transferring care from [place]. Pt wants results of labs from [place] office. Pt states about 1 week ago he had an episode of heart fluttering. He took an Aspirin with some relief.   Physical Exam  Additional Exam:  General: Well-developed, well-nourished in no distress head: Normocephalic and atraumatic eyes PERRLA/EOMI intact, conjunctiva and lids normal nose: No deformity or lesions mouth normal dentition, normal posterior pharynx neck: Supple, no JVD.  No masses, thyromegaly or abnormal cervical nodes lungs: Normal breath sounds bilaterally without wheezing.  Normal percussion heart: regular rate and rhythm with normal S1 and S2, no S3 or S4.  PMI is normal.  No pathological murmurs abdomen: Normal bowel sounds, abdomen is soft and nontender without masses, organomegaly  or hernias noted.  No hepatosplenomegaly. No pulsatile mass.  musculoskeletal: Back normal, normal gait muscle strength and tone normal pulsus: Pulse is normal in all 4 extremities Extremities: No peripheral pitting edema neurologic: Alert and oriented x 3 skin: Intact without lesions or rashes cervical nodes: No significant adenopathy psychologic: Normal affect     Impression & Recommendations:  Problem # 1:  CAD (ICD-414.00) the patient has a history of coronary artery disease status post prior angioplasty. He is in NYHA class II. He denies any chest pain but has a very abnormal electrocardiogram with evidence of a prior inferolateral as well as anterior wall myocardial infarction. His last ejection fraction was measured at 33%. The patient will be scheduled for an echocardiogram to evaluate his LV function. Consideration may need to be given to ICD placement if ejection fraction less than 35%. His updated medication  list for this problem includes:    Toprol Xl 50 Mg Xr24h-tab (Metoprolol succinate) .[name]... Take 1 tablet by mouth once a day    Aspir-low 81 Mg Tbec (Aspirin) .[name]... Take 1 tablet by mouth once a day    Lisinopril 10 Mg Tabs (Lisinopril) .[name]... Take 1 tablet by mouth once a day  Problem # 2:  AAA (ICD-441.4) the patient qualifies for a screening ultrasound of the abdomen: His greater than age 86 and has a long-standing tobacco history is well-documented coronary artery disease.  Problem # 3:  ERECTILE DYSFUNCTION, ORGANIC (ICD-607.84) I have refilled the patient's Viagra prescription. He takes 100 mg daily as needed  Problem # 4:  DYSLIPIDEMIA (ICD-272.4) patient is unable to tolerate niacin. Given the lack of convincing data an HDL cholesterol, at this point I've asked the patient to continue simvastatin only his cholesterol levels are followed by his primary care physician. The following medications were removed from the medication list:    Vytorin 10-40 Mg Tabs  (Ezetimibe-simvastatin) .[name]... Take 1 tablet by mouth once a day His updated medication list for this problem includes:    Simvastatin 40 Mg Tabs (Simvastatin) .[name]... Take one tablet by mouth daily at bedtime  Problem # 5:  ESSENTIAL HYPERTENSION, BENIGN (ICD-401.1) the patient ran out of his medications. We have refilled his Toprol, and lisinopril. We'll reassess his blood pressure after he is back on his medications. He also has white coat hypertension. The following medications were removed from the medication list:    Atacand 32 Mg Tabs (Candesartan cilexetil) .[name]... Take 1 tablet by mouth once a day His updated medication list for this problem includes:    Toprol Xl 50 Mg Xr24h-tab (Metoprolol succinate) .[name]... Take 1 tablet by mouth once a day    Aspir-low 81 Mg Tbec (Aspirin) .[name]... Take 1 tablet by mouth once a day    Lisinopril 10 Mg Tabs (Lisinopril) .[name]... Take 1 tablet by mouth once a day  Problem # 6:  LEFT VENTRICULAR FUNCTION, DECREASED (ICD-429.2) the patient has a prior ejection fraction of 33%. This was by Cardiolite several years ago. Will reassess his ejection fraction with an echocardiogram. His ejection fraction is less than 30% in addition to ACE inhibitor and beta blocker the patient qualified for spironolactone, provided his creatinine is stable and his GFR is greater than 30 mL as per minute. He is an NYHA classII  Other Orders: EKG w/ Interpretation (93000) Ultrasound (Ultrasound) 2-D Echocardiogram (2D [place])  Patient Instructions: 1)  Viagra 100mg  - one dose per day  2)  [place] 3)  Abdominal ultrasound 4)  Follow up in 6 months. Prescriptions: SIMVASTATIN 40 MG TABS (SIMVASTATIN) Take one tablet by mouth daily at bedtime  #30 x 6   Entered by:   Hoover Brunette, LPN   Authorized by:   Lewayne Bunting, MD, [facility]   Signed by:   Hoover Brunette, LPN on 16/06/9603   Method used:   Electronically to        [facility]* (retail)       [address]       [place], [place]  [address]       Ph: [phone]       Fax: [phone]   RxID:   [id number] LISINOPRIL 10 MG TABS (LISINOPRIL) Take 1 tablet by mouth once a day  #30 x 6   Entered by:   Hoover Brunette, LPN  Authorized by:   Lewayne Bunting, MD, [facility]   Signed by:   Hoover Brunette, LPN on 60/45/4098   Method used:   Electronically to        [facility]* (retail)       [address]       [place], [place]  [address]       Ph: [phone]       Fax: [phone]   RxID:   [id number] TOPROL XL 50 MG XR24H-TAB (METOPROLOL SUCCINATE) Take 1 tablet by mouth once a day  #30 x 6   Entered by:   Hoover Brunette, LPN   Authorized by:   Lewayne Bunting, MD, [facility]   Signed by:   Hoover Brunette, LPN on 53/66/4403   Method used:   Electronically to        [facility]* (retail)       [address]       [place], [place]  [address]       Ph: [phone]       Fax: [phone]   RxID:   [id number] VIAGRA 100 MG TABS (SILDENAFIL CITRATE) Take 1 tablet by mouth as directed, only one tab per day  #30 x 1   Entered by:   Hoover Brunette, LPN   Authorized by:   Lewayne Bunting, MD, [facility]   Signed by:   Hoover Brunette, LPN on 73/22/0254   Method used:   Electronically to        [facility]* (retail)       [address]       [place], [place]  [address]       Ph: [phone]       Fax: [phone]   RxID:   [id number]   Appended Document: EST-TRANSFER FROM [place] Need recent BMET, if creatinine normal should be started on spironolactone with f/u BMET in one week and d/c all K- supplements while on spironolactone. If no recent BMET will have to draw first. Spironolactone would improve functional class and improve mortality eith EF= 30-35%  Appended Document: EST-TRANSFER FROM [place] Wife and pt. notified.  Will send order to [facility] for Monday.    Appended Document: EST-TRANSFER FROM [place] BMET back (see  scanned report).  Notified Dr. Andee Lineman of creatinine of 1.20.  Okay to start Spironolactone 25mg  daily 3-4 days after MRI and okay to move forward with test.    Appended Document: EST-TRANSFER FROM [place] Patient notified.   Will send med to [facility].    Clinical Lists Changes  Medications: Added new medication of SPIRONOLACTONE 25 MG TABS (SPIRONOLACTONE) Take 1 tablet by mouth once a day  (start about 3-4 days after MRI) - Signed Rx of SPIRONOLACTONE 25 MG TABS (SPIRONOLACTONE) Take 1 tablet by mouth once a day  (start about 3-4 days after MRI);  #30 x 6;  Signed;  Entered by: Hoover Brunette, LPN;  Authorized by: Lewayne Bunting, MD, [facility];  Method used: Electronically to [facility]*, [address] [place], [place], [place]  [address], Ph: [phone], Fax: [phone]    Prescriptions: SPIRONOLACTONE 25 MG TABS (SPIRONOLACTONE) Take 1 tablet by mouth once a day  (start about 3-4 days after MRI)  #30 x 6   Entered by:   Hoover Brunette, LPN   Authorized by:   Michelle Piper  Andee Lineman, MD, [facility]   Signed by:   Hoover Brunette, LPN on 16/06/9603   Method used:   Electronically to        [facility]* (retail)       [address]       [place], [place]  [address]       Ph: [phone]       Fax: [phone]   RxID:   [id number]

## 2010-10-17 NOTE — Miscellaneous (Signed)
Summary: Corrected device information  Clinical Lists Changes  Observations: Added new observation of ICD MODL#: E110 (10/10/2010 7:57)       ICD Specifications Following MD:  Hillis Range, MD     ICD Vendor:  [facility] [employer]     ICD Model Number:  E110     ICD Serial Number:  [id number] ICD DOI:  12/10/2009     ICD Implanting MD:  Hillis Range, MD  Lead 1:    Location: RA     DOI: 12/10/2009     Model #: [id number]     Serial #: [id number]     Status: active Lead 2:    Location: RV     DOI: 12/10/2009     Model #: [id number]     Serial #: [id number]     Status: active  Indications::  ICM   ICD Follow Up ICD Dependent:  No      Brady Parameters Mode DDI     Lower Rate Limit:  40      Tachy Zones VF:  250     VT:  200     VT1:  170

## 2010-10-17 NOTE — Miscellaneous (Signed)
Summary: rx - change simvastatin to lipitor  Clinical Lists Changes  Medications: Changed medication from SIMVASTATIN 40 MG TABS (SIMVASTATIN) Take one tablet by mouth daily at bedtime to LIPITOR 80 MG TABS (ATORVASTATIN CALCIUM) Take 1 tab by mouth at bedtime - Signed Rx of LIPITOR 80 MG TABS (ATORVASTATIN CALCIUM) Take 1 tab by mouth at bedtime;  #30 x 6;  Signed;  Entered by: Hoover Brunette, LPN;  Authorized by: Lewayne Bunting, MD, [facility];  Method used: Electronically to [facility]*, [address], [place], [place], [place]  [address], Ph: [phone], Fax: [phone]    Prescriptions: LIPITOR 80 MG TABS (ATORVASTATIN CALCIUM) Take 1 tab by mouth at bedtime  #30 x 6   Entered by:   Hoover Brunette, LPN   Authorized by:   Lewayne Bunting, MD, [facility]   Signed by:   Hoover Brunette, LPN on 13/04/6577   Method used:   Electronically to        [facility]* (retail)       [address]       [place], [place]  [address]       Ph: [phone]       Fax: [phone]   RxID:   [id number]

## 2010-10-23 NOTE — Assessment & Plan Note (Signed)
Summary: Research patient   Current Medications (verified): 1)  Metoprolol Succinate 100 Mg Xr24h-Tab (Metoprolol Succinate) .... Take One Tablet By Mouth Daily 2)  Fish Oil 1000 Mg Caps (Omega-3 Fatty Acids) .... Take 1 Tablet By Mouth Once A Day 3)  Aspirin 325 Mg Tabs (Aspirin) .... Take 1 Tablet By Mouth Once A Day 4)  Lisinopril 20 Mg Tabs (Lisinopril) .... Take 1 Tablet By Mouth Once A Day 5)  Lipitor 80 Mg Tabs (Atorvastatin Calcium) .... Take 1 Tab By Mouth At Bedtime 6)  Pantoprazole Sodium 40 Mg Tbec (Pantoprazole Sodium) .... Take 1 Tablet By Mouth Once A Day 7)  Spironolactone 25 Mg Tabs (Spironolactone) .... Take 1 Tablet By Mouth Daily  Allergies (verified): No Known Drug Allergies    ICD Specifications Following MD:  Hillis Range, MD     ICD Vendor:  [facility] [employer]     ICD Model Number:  100     ICD Serial Number:  [id number] ICD DOI:  12/10/2009     ICD Implanting MD:  Hillis Range, MD  Lead 1:    Location: RA     DOI: 12/10/2009     Model #: [id number]     Serial #: [id number]     Status: active Lead 2:    Location: RV     DOI: 12/10/2009     Model #: [id number]     Serial #: [id number]     Status: active  Indications::  ICM   ICD Follow Up Battery Voltage:  GOOD V     Charge Time:  8.7 seconds     Battery Est. Longevity:  10.5 YRS Underlying rhythm:  SR ICD Dependent:  No       ICD Device Measurements Atrium:  Amplitude: 8.9 mV, Impedance: 570 ohms, Threshold: 1.0 V at 0.4 msec Right Ventricle:  Amplitude: 25 mV, Impedance: 517 ohms, Threshold: 1.0 V at 0.4 msec Shock Impedance: 60 ohms   Episodes MS Episodes:  0     Shock:  0     ATP:  0     Nonsustained:  19     Atrial Therapies:  0 Atrial Pacing:  <1%     Ventricular Pacing:  <1%  Brady Parameters Mode DDI     Lower Rate Limit:  40      Tachy Zones VF:  250     VT:  200     VT1:  170     Next Cardiology Appt Due:  12/16/2010 Tech Comments:  RESEARCH PT---19 NST EPISODES--LONGEST WAS 16 SECONDS--10 BEATS LONG.  NORMAL  DEVICE FUNCTION.  NO CHANGES MADE.  WT 22.4 BP 124/78 AND HR 69.  ROV IN 3 MTHS W/DEVICE CLINIC IN [place]. Vella Kohler  October 09, 2010 10:18 AM

## 2010-11-14 DIAGNOSIS — I639 Cerebral infarction, unspecified: Secondary | ICD-10-CM

## 2010-11-14 HISTORY — DX: Cerebral infarction, unspecified: I63.9

## 2010-11-18 ENCOUNTER — Encounter: Payer: Self-pay | Admitting: Cardiology

## 2010-11-18 ENCOUNTER — Encounter (INDEPENDENT_AMBULATORY_CARE_PROVIDER_SITE_OTHER): Payer: Self-pay | Admitting: *Deleted

## 2010-11-24 DIAGNOSIS — I252 Old myocardial infarction: Secondary | ICD-10-CM

## 2010-11-24 DIAGNOSIS — I2510 Atherosclerotic heart disease of native coronary artery without angina pectoris: Secondary | ICD-10-CM

## 2010-11-24 DIAGNOSIS — I119 Hypertensive heart disease without heart failure: Secondary | ICD-10-CM

## 2010-11-25 DIAGNOSIS — I2510 Atherosclerotic heart disease of native coronary artery without angina pectoris: Secondary | ICD-10-CM

## 2010-11-25 DIAGNOSIS — I428 Other cardiomyopathies: Secondary | ICD-10-CM

## 2010-11-26 NOTE — Miscellaneous (Signed)
Summary: Orders Update- BMET,FLP,LFT  Clinical Lists Changes  Orders: Added new Test order of T-Basic Metabolic Panel [phone]) - Signed Added new Test order of T-Lipid Profile [phone]) - Signed Added new Test order of T-Hepatic Function [phone]) - Signed

## 2010-11-26 NOTE — Letter (Signed)
Summary: Generic Engineer, agricultural at [facility] [address]   [place], [place] [address]   Phone: [phone]  Fax: [phone]        November 18, 2010 MRN: [id number]    [facility] [address] [place], [place]  [address]    Dear [name],  According to our records, it is now time for your follow up lab work.  Please take the enclosed order to the [facility] at your earliest convenience.   Reminder:  Nothing to eat or drink after 12 midnight prior to labs.         Sincerely,  Hoover Brunette, LPN  This letter has been electronically signed by your physician.

## 2010-12-04 ENCOUNTER — Other Ambulatory Visit: Payer: Self-pay | Admitting: Cardiology

## 2010-12-04 DIAGNOSIS — N19 Unspecified kidney failure: Secondary | ICD-10-CM

## 2010-12-12 NOTE — Telephone Encounter (Signed)
[place] 

## 2010-12-13 ENCOUNTER — Other Ambulatory Visit: Payer: Self-pay | Admitting: Cardiology

## 2010-12-17 ENCOUNTER — Other Ambulatory Visit: Payer: Self-pay | Admitting: Cardiology

## 2011-01-13 ENCOUNTER — Encounter: Payer: Self-pay | Admitting: Cardiology

## 2011-01-13 ENCOUNTER — Telehealth: Payer: Self-pay | Admitting: *Deleted

## 2011-01-13 NOTE — Telephone Encounter (Signed)
Patient received reminder letter in mail for f/u cholesterol labs.  States he had recently been in the hospital & wants to know if those labs can be used for what he needed.  See labs scanned into EPIC from 11/25/10.  Has scheduled OV for 6/4 with GD.

## 2011-01-17 ENCOUNTER — Ambulatory Visit (INDEPENDENT_AMBULATORY_CARE_PROVIDER_SITE_OTHER): Payer: PRIVATE HEALTH INSURANCE | Admitting: Internal Medicine

## 2011-01-17 ENCOUNTER — Encounter: Payer: Self-pay | Admitting: Internal Medicine

## 2011-01-17 ENCOUNTER — Encounter (INDEPENDENT_AMBULATORY_CARE_PROVIDER_SITE_OTHER): Payer: PRIVATE HEALTH INSURANCE

## 2011-01-17 DIAGNOSIS — I2589 Other forms of chronic ischemic heart disease: Secondary | ICD-10-CM

## 2011-01-17 DIAGNOSIS — Z95810 Presence of automatic (implantable) cardiac defibrillator: Secondary | ICD-10-CM

## 2011-01-17 DIAGNOSIS — I428 Other cardiomyopathies: Secondary | ICD-10-CM

## 2011-01-17 DIAGNOSIS — I10 Essential (primary) hypertension: Secondary | ICD-10-CM

## 2011-01-17 DIAGNOSIS — R0989 Other specified symptoms and signs involving the circulatory and respiratory systems: Secondary | ICD-10-CM

## 2011-01-17 DIAGNOSIS — F17200 Nicotine dependence, unspecified, uncomplicated: Secondary | ICD-10-CM

## 2011-01-17 DIAGNOSIS — I2510 Atherosclerotic heart disease of native coronary artery without angina pectoris: Secondary | ICD-10-CM
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
## 2011-01-17 NOTE — Patient Instructions (Signed)
Follow up as scheduled. Your physician recommends that you continue on your current medications as directed. Please refer to the Current Medication list given to you today. 

## 2011-01-17 NOTE — Assessment & Plan Note (Signed)
Stable without CHF on exam Normal ICD function, no changes today See PACEART.

## 2011-01-17 NOTE — Assessment & Plan Note (Signed)
Cessation advised. 

## 2011-01-17 NOTE — Assessment & Plan Note (Signed)
Stable Salt restriction advised 

## 2011-01-17 NOTE — Assessment & Plan Note (Signed)
Muscle ache with lipitor We will consider switching to crestor.  He will discuss this further with Dr Andee Lineman upon follow-up next month.

## 2011-01-28 ENCOUNTER — Telehealth: Payer: Self-pay | Admitting: *Deleted

## 2011-01-28 DIAGNOSIS — E785 Hyperlipidemia, unspecified: Secondary | ICD-10-CM

## 2011-01-28 MED ORDER — ROSUVASTATIN CALCIUM 10 MG PO TABS: 10.0000 mg | ORAL_TABLET | Freq: Every day | ORAL | Status: DC

## 2011-01-28 NOTE — H&P (Signed)
NAME:  Adam House, Adam House            ACCOUNT NO.:  [id number]   MEDICAL RECORD NO.:  [id number]          PATIENT TYPE:  AMB   LOCATION:  DAY                           FACILITY:  [facility]   PHYSICIAN:  Dalia Heading, M.D.  DATE OF BIRTH:  1950-11-30   DATE OF ADMISSION:  DATE OF DISCHARGE:  LH                              HISTORY & PHYSICAL   CHIEF COMPLAINT:  Need for screening colonoscopy.   HISTORY OF PRESENT ILLNESS:  The patient is a 60 year old black male who  is referred for endoscopic evaluation.  He needs colonoscopy for  screening purposes.  No abdominal pain, weight loss, nausea, vomiting,  diarrhea, constipation, melena, or hematochezia have been noted.  He has  never had a colonoscopy.  There is no family history of colon carcinoma.   PAST MEDICAL HISTORY:  1. Hypertension.  2. Reflux disease.  3. Hyperlipidemia.   PAST SURGICAL HISTORY:  Unremarkable.   CURRENT MEDICATIONS:  Ciprofloxacin, Protonix, simvastatin, metoprolol,  baby aspirin.   ALLERGIES:  No known drug allergies.   REVIEW OF SYSTEMS:  The patient smokes a pack of cigarettes a day.  He  denies any significant alcohol use.  He denies any other cardiopulmonary  difficulties or bleeding disorders.   PHYSICAL EXAMINATION:  GENERAL:  The patient is a well-developed, well-  nourished black male in no acute distress.  LUNGS:  Clear to auscultation with equal breath sounds bilaterally.  HEART:  Regular rate and rhythm without S3, S4, or murmurs.  ABDOMEN:  Soft, nontender, nondistended.  No hepatosplenomegaly or  masses are noted.  RECTAL:  Deferred due to the procedure.   IMPRESSION:  Need for screening colonoscopy.   PLAN:  The patient is scheduled for colonoscopy on July 07, 2008.  The risks and benefits of the procedure including bleeding and  perforation were fully explained to the patient, gave informed consent.      Dalia Heading, M.D.     MAJ/MEDQ  D:  06/27/2008  T:  06/28/2008  Job:   [id number]   cc:   [name], M.D.  Fax: [phone]

## 2011-01-28 NOTE — H&P (Signed)
NAME:  Adam House, Adam House            ACCOUNT NO.:  [id number]   MEDICAL RECORD NO.:  [id number]           PATIENT TYPE:  AMB   LOCATION:  DAY                           FACILITY:  [facility]   PHYSICIAN:  Dalia Heading, M.D.  DATE OF BIRTH:  21-Aug-1951   DATE OF ADMISSION:  DATE OF DISCHARGE:  LH                              HISTORY & PHYSICAL   CHIEF COMPLAINT:  Need for screening colonoscopy.   HISTORY OF PRESENT ILLNESS:  The patient is a 60 year old black male who  is referred for endoscopic evaluation.  He needs colonoscopy for  screening purposes.  No abdominal pain, weight loss, nausea, vomiting,  diarrhea, constipation, melena, or hematochezia have been noted.  He has  never had a colonoscopy.  There is no family history of colon carcinoma.   PAST MEDICAL HISTORY:  1. Hypertension.  2. Reflux disease.  3. Hyperlipidemia.   PAST SURGICAL HISTORY:  Unremarkable.   CURRENT MEDICATIONS:  Ciprofloxacin, Protonix, simvastatin, metoprolol,  baby aspirin.   ALLERGIES:  No known drug allergies.   REVIEW OF SYSTEMS:  The patient smokes a pack of cigarettes a day.  He  denies any significant alcohol use.  He denies any other cardiopulmonary  difficulties or bleeding disorders.   PHYSICAL EXAMINATION:  GENERAL:  The patient is a well-developed, well-  nourished black male in no acute distress.  LUNGS:  Clear to auscultation with equal breath sounds bilaterally.  HEART:  Regular rate and rhythm without S3, S4, or murmurs.  ABDOMEN:  Soft, nontender, nondistended.  No hepatosplenomegaly or  masses are noted.  RECTAL:  Deferred due to the procedure.   IMPRESSION:  Need for screening colonoscopy.   PLAN:  The patient is scheduled for colonoscopy on July 07, 2008.  The risks and benefits of the procedure including bleeding and  perforation were fully explained to the patient, gave informed consent.      Dalia Heading, M.D.  Electronically Signed     MAJ/MEDQ  D:   06/27/2008  T:  06/28/2008  Job:  [id number]   cc:   [name], M.D.  Fax: [phone]

## 2011-01-28 NOTE — Assessment & Plan Note (Signed)
[facility]                       [place] CARDIOLOGY OFFICE NOTE   NAME:Heesch, KAVEN CUMBIE                   MRN:          [id number]  DATE:05/10/2007                            DOB:          1950-11-07    IDENTIFICATION:  Mr. Fancher is a 61 year old gentleman with a history  of coronary artery disease, LV dysfunction, dyslipidemia. I last saw him  in the clinic back in May.   At that time, I switched him to Vytorin 10/40 and he is taking this. I  also thought I increased his beta-blocker, but he has kept on the same  dose.   In the interval, he has done okay. He [place] where he got a little sweaty and had to sit down for a while.  Does not know if his blood pressure was off at that time.   He denies chest pain. Breathing  is okay.   CURRENT MEDICATIONS:  1. Co-enzyme Q10.  2. Toprol XL 50.  3. Altace 5.  4. Fish oil one gram daily.  5. Vytorin 10/40 daily.  6. Aspirin 81 mg daily.  7. Atacand, question, four daily.   PHYSICAL EXAMINATION:  The patient is in no distress. Blood pressure  132/88, pulse 88 and regular. Weight is 210.  NECK: No bruits.  LUNGS:  Clear.  CARDIAC: Regular rate and rhythm, S1, S2. No S3. No significant murmurs.  ABDOMEN: Benign.  EXTREMITIES: No edema.   IMPRESSION:  1. Coronary artery disease, status post percutaneous transluminal      coronary angioplasty (PTCA) to the left anterior descending artery      (LAD). Circumflex normal, right coronary artery (RCA) with 30-40%      stenosis (cath in 1999). Continue on medical therapy. Note, Myoview      scan done in 2007, showed a small fixed lateral defect and a      moderate fixed anterior and apical defect, ejection fraction of      33%.  2. Left ventricular dysfunction by Myoview was 33%, cath in 1999 was      40%. Would continue on current regimen and not always clear to me      if he is taking his medications all the  time. I would not push for      now.  3. Dyslipidemia. Reports he is taking the 10/40 Vytorin. Will check a      fasting lipid panel. I have given him a prescription to have this      done in [place].   I will set to see him back in about five months, sooner if problems  develop.     Pricilla Riffle, MD, [facility]  Electronically Signed    PVR/MedQ  DD: 05/10/2007  DT: 05/10/2007  Job #: [id number]   cc:   [name], M.D.

## 2011-01-28 NOTE — Telephone Encounter (Signed)
Message copied by Hoover Brunette on Tue Jan 28, 2011  4:11 PM ------      Message from: Lewayne Bunting      Created: Sat Jan 18, 2011  6:29 AM       Reviewed lipid panel obtained during hospitalization. Needs to continue statin but reported muscle pain to Dr. Johney Frame. Can switch to crestor 10mg  Po qdaily in meanwhile and recheck lipid panel in 6-8 weeks. Has appointment with me 6/4 we can discuss further therapy at that time.

## 2011-01-28 NOTE — Assessment & Plan Note (Signed)
[facility]                        CARDIOLOGY OFFICE NOTE   NAME:Cordova, JOSEHUA HAMMAR                   MRN:          [id number]  DATE:02/05/2007                            DOB:          05/02/1951    IDENTIFICATION:  Mr. Rodino is a 60 year old gentleman with a history  of a cardiomyopathy and dyslipidemia.  I last saw the patient back in  February.   When I saw him, it was unclear if he was taking his medicines fully, but  I kept him on the same regimen.   In the interval, he says he has run out of the Toprol a few weeks ago.  He is taking his other medicines.   In talking to him about his diet, he says he eats a lot of fried foods  and shrimp salads he likes a lot.   CURRENT MEDICATIONS INCLUDE:  1. Flexeril p.r.n.  2. Aspirin 81.  3. K-Dur 10 mEq every other day.  4. Fish oil daily.  5. Vytorin 10/20 daily.  6. Atacand 8 daily.   PHYSICAL EXAM:  Patient is in no distress.  Blood pressure is 140/80,  pulse is 88, weight 212, relatively stable.  LUNGS:  Clear.  NECK:  Jugular venous pressure is normal.  CARDIAC EXAM:  Regular rate and rhythm, S1, S2, no S3, no murmurs.  ABDOMEN:  Benign.   IMPRESSION:  1. Cardiomyopathy:  Volume status looks good.  I would increase his      beta blockade to 50 Lopressor b.i.d.  Continue on the Atacand.  2. Dyslipidemia:  Last lipid panel, LDL was worse at 169, HDL was 11,      triglycerides of 226.  For now, I will switch him to Vytorin 10/40.      We will need to repeat the lipids in eight weeks.  3. Coronary artery disease, again status post PTCA to LAD, EF worse      than this.  No evidence of ongoing ischemia.   I will set to see him in July, sooner if problems develop.    Pricilla Riffle, MD, [facility]  Electronically Signed   PVR/MedQ  DD: 02/05/2007  DT: 02/05/2007  Job #: [id number]

## 2011-01-28 NOTE — Telephone Encounter (Signed)
Patient notified of results and verbalized understanding. Will change to Crestor. New rx sent to [facility] at pt request.

## 2011-01-28 NOTE — Letter (Signed)
June 09, 2008    [name], M.D.  [address]  [place], [place] [address]   RE:  Adam, House  MRN:  [id number]  /  DOB:  [birth year]   Dear Loraine Leriche:   It is my pleasure reevaluating Adam House in the office today at your  request.  As you know, he has a remote history of coronary artery  disease and has done quite well over the past decade since percutaneous  intervention.  He has had mild left ventricular dysfunction with no  symptoms.  He is to have colonoscopy in the near future, and he  recommended cardiology reevaluation prior to that.  Adam House has  discontinued all of his cardiac medications except for aspirin  suggesting that they are too expensive.  He continues to smoke  cigarettes 3 to one-half pack per day.   Current medications include only aspirin 325 mg daily, Ciprofloxacin  been taken for urinary tract infection, and Protonix 40 mg b.i.d.   PHYSICAL EXAMINATION:  GENERAL:  A pleasant proportionate gentleman in  no acute distress.  VITAL SIGNS:  The weight is 211.  Blood pressure 150/95, heart rate 100  and regular, and respirations 14.  NECK:  No jugular venous distention; no carotid bruits.  LUNGS:  Few inspiratory and expiratory rhonchi.  CARDIAC:  Normal first and second heart sounds.  ABDOMEN:  Soft and nontender; no organomegaly.  EXTREMITIES:  No edema; distal pulses are intact.   EKG:  Normal sinus rhythm; left atrial abnormality; prior anteroseptal  and inferolateral myocardial infarctions.  When compared to a previous  tracing of May 15, 2003, there has been no significant change.   IMPRESSION:  Adam House continues to do well symptomatically.  A  colonoscopy represents minimal risk for him.  Of much greater concern is  the lack of appropriate treatment.  He will utilize over-the-counter  nicotine patches in attempt to discontinue cigarette smoking.  He will  start simvastatin 40 mg daily and metoprolol ER 50 mg  daily.  We will  check a lipid profile in 1 month and see him back in the office in 3  months.  He will monitor blood pressure at home.    Sincerely,      Gerrit Friends. Dietrich Pates, MD, [facility]  Electronically Signed    RMR/MedQ  DD: 06/09/2008  DT: 06/10/2008  Job #: [id number]

## 2011-01-28 NOTE — Letter (Signed)
August 28, 2008    [name], MD  [address]  [place], [place] [address]   RE:  Adam House, Adam House  MRN:  [id number]  /  DOB:  10/10/1950   Dear Loraine Leriche,   Adam House returns to the office for continued management of coronary  disease and cardiovascular risk factors.  Since his last visit, his  brother has become greatly ill.  He has spent most of this time  assisting with his needs.  As a result, he has neglected exercise and  has not been taking his medicine routinely.  He did smoke 1 cigarette a  few weeks ago, but otherwise has completely refrained from cigarette  use.  He is using chewing tobacco.   Current medications include aspirin 325 mg daily, nicotine patch p.r.n.,  simvastatin 40 mg daily, metoprolol ER 50 mg daily, fish oil daily.   PHYSICAL EXAMINATION:  GENERAL:  Pleasant gentleman in no acute  distress.  VITAL SIGNS:  The weight is 210, stable.  Blood pressure 140/90, heart  rate 68 and regular, respirations 12 and unlabored.  NECK:  No jugular venous distention; normal carotid upstrokes without  bruits.  LUNGS:  Clear.  CARDIAC:  Normal first and second heart sounds; fourth heart sound  present.  ABDOMEN:  Soft and nontender; no organomegaly.  EXTREMITIES:  No edema.   IMPRESSION:  Adam House continues to be asymptomatic, now 11 years  following acute myocardial infarction with percutaneous intervention for  single-vessel left anterior descending  disease.  I have suggested that he reduce aspirin to 81 mg daily.  We  will add lisinopril 10 mg daily because of his significant left  ventricular dysfunction.  He is encouraged to take his medications  routinely, to refrain from use of cigarettes, and to return to see me in  5 months.  A chemistry profile and lipid profile will be obtained in 1  month.    Sincerely,      Gerrit Friends. Dietrich Pates, MD, [facility]  Electronically Signed    RMR/MedQ  DD: 08/28/2008  DT: 08/29/2008  Job #:  [id number]

## 2011-01-28 NOTE — Letter (Signed)
November 27, 2008    [name], MD  [address]  [place], [place] [address]   RE:  Adam House, Adam House  MRN:  [id number]  /  DOB:  1951-03-07   Dear Adam House,   Adam House returns to the office for continued assessment and  treatment of coronary disease with previous myocardial infarction and  multiple cardiovascular risk factors.  Since his last visit, he has done  quite well.  He has increased his exercise, walks 3-4 miles in length.  He has reduced cigarette consumption to 1/5th pack per day (for  cigarettes).  His last lipid profile was suboptimal with an HDL of 10  and an LDL of 130.  He continues to experience erectile dysfunction.  He  had good results from Viagra 100 mg, but less good results from Cialis,  the most recent drug he has utilized.  His recent chemistry profile  shows mild chronic kidney disease with a creatinine of 1.4.   Current medications include aspirin 81 mg daily, simvastatin 40 mg  daily, metoprolol 50 mg daily, fish oil 1 daily, red yeast rice 1 daily.   On exam, vigorous trim-appearing gentleman in no acute distress.  The  weight is 214, 4 pounds more than last December.  Blood pressure 130/80,  heart rate 70 and regular, respirations 12 and unlabored.  Neck:  No  jugular venous distention; no carotid bruits.  Lungs:  Clear.  Cardiac:  Normal first and second heart sounds; fourth heart sound present.  Abdomen:  Soft and nontender; no bruits.  Extremities:  No edema.   IMPRESSION:  Adam House is doing generally well except for suboptimal  control of dyslipidemia.  We will add niacin and titrate to 500 mg  t.i.d.  A lipid profile and chemistry profile will be obtained in 2  months.  I will see this nice gentleman again in 9 months.  If he does  not tolerate niacin, we will consider use of Niaspan.  Blood pressure  control is good.  He has no symptoms to suggest recurrent myocardial  ischemia.    Sincerely,      Adam House.  Adam Pates, MD, [facility] [place]  Electronically Signed    RMR/MedQ  DD: 11/27/2008  DT: 11/28/2008  Job #: [id number]

## 2011-01-31 NOTE — Op Note (Signed)
NAMEYOSEPH, HAILE            ACCOUNT NO.:  [id number]   MEDICAL RECORD NO.:  [id number]          PATIENT TYPE:  INP   LOCATION:  3013                         FACILITY:  [facility]   PHYSICIAN:  Hilda Lias, M.D.   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   POSTOPERATIVE DIAGNOSIS:  C5-C6 herniated disk with a right C6  radiculopathy.   OPERATION PERFORMED:  Anterior 5-6 diskectomy, interbody fusion with  allograft and autograft, plate, microscope.   SURGEON:  Hilda Lias, M.D.   ASSISTANT:  Stefani Dama, M.D.   ANESTHESIA:  General.   INDICATIONS FOR PROCEDURE:  The patient had been complaining of neck pain  with radiation to the right upper extremity.  X-ray showed that he has a  herniated disk compromising the right side.  Surgery was advised and the  risks were explained in the history and physical.   DESCRIPTION OF PROCEDURE:  The patient was taken to the operating room and  the neck was prepped.  A transverse incision was made and dissection was  carried down to the cervical spine.  We found a large anterior osteophyte  and we did the x-ray which showed that we were at the level 4-5.  From then  on, we went down one space where the osteophyte was removed.  The patient  had quite a bit of calcification of the anterior ligament.  Resection of  anterior ligament was achieved.  Then we brought the microscope into the  area and with a curet we did a total diskectomy.  We opened the posterior  ligament and it was a combination of herniated disk and spondylosis  compromising the right side of the spinal cord as well as the C6 nerve root.  Decompression was achieved.  Same procedure was done at the level in the  left side.  At the end having a good decompression of the spinal cord on  both C6 nerve roots,  the end plate was drilled.  An allograft of 8 mm with  autograft inside was inserted followed by a plate using four screws.  Because of the size of the patient we were unable to see the upper part of  the plate which was at the level C5.  From then on, the area was irrigated.  We waited five minutes to be sure that we had accomplished good hemostasis.  Once we realized that everything was normal and dry, the wound was closed  with Vicryl and Steri-Strips.           ______________________________  Hilda Lias, M.D.     EB/MEDQ  D:  11/04/2005  T:  11/04/2005  Job:  [id number]

## 2011-01-31 NOTE — H&P (Signed)
NAMEJERRIAN, MELLS NO.:  [id number]   MEDICAL RECORD NO.:  [id number]          PATIENT TYPE:  INP   LOCATION:  3172                         FACILITY:  [facility]   PHYSICIAN:  Hilda Lias, M.D.   DATE OF BIRTH:  [birth year]   DATE OF ADMISSION:  11/04/2005  DATE OF DISCHARGE:                                HISTORY & PHYSICAL   Mr. Caspers is a patient who came to see me in my office with his wife  because of neck pain with radiation to the right upper extremity for several  months which is getting worse. At nighttime he has difficulty falling  asleep. He denies any pain in the left upper extremity and sometimes he has  difficulty with his neck. The patient had an MRI and sent to [place] for further  evaluation.   PAST MEDICAL HISTORY:  Stent in his heart.   SOCIAL HISTORY:  He drinks socially. He smokes a half a pack a day.   FAMILY HISTORY:  Unremarkable.   REVIEW OF SYSTEMS:  Positive for high cholesterol, neck pain, __________,  indigestion.   PHYSICAL EXAMINATION:  HEENT:  Normal.  NECK:  He has some difficulty with pain moving the neck. He has pain mostly  going to the right shoulder.  LUNGS:  Clear.  HEART SOUNDS:  Normal.  ABDOMEN:  Normal.  EXTREMITIES:  Normal pulses.  NEUROLOGIC:  He has weakness of both biceps under resistance. Sensation is  normal. Coordination normal. Cervical spine showed lack of the normal  lordosis. The MRI showed that he has a large herniated disc at the level 5-6  central and to the right. There is a small one seen at the level 3-4.   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   RECOMMENDATIONS:  The patient is being admitted for surgery. The procedure  will be anterior cervical discectomy with fusion. He knows about the risks  such as infection, CSF leak, stroke, damage to the vocal cords, damage to  the esophagus.           ______________________________  Hilda Lias, M.D.     EB/MEDQ  D:  11/04/2005  T:  11/04/2005  Job:  [id number]

## 2011-01-31 NOTE — Procedures (Signed)
NAME:  Adam House, WOLF NO.:  [id number]   MEDICAL RECORD NO.:  [id number]         PATIENT TYPE:  OUT   LOCATION:                                FACILITY:  [facility]   PHYSICIAN:  Vida Roller, M.D.   DATE OF BIRTH:  1951-07-21   DATE OF PROCEDURE:  DATE OF DISCHARGE:                                    STRESS TEST   HISTORY OF PRESENT ILLNESS:  Mr. Stineman is a 60 year old gentleman with a  history of anterior wall myocardial infarction status post percutaneous  coronary angioplasty in 1998 to his LAD most recently evaluated with a rest-  stress Myoview in September 2004 revealing no ischemia, positive scar and an  EF of 35%. The patient is now scheduled for back surgery secondary to  herniated cervical disk.   BASELINE DATA:  Electrocardiogram reveals a sinus rhythm at 75 beats per  minute with anterior Q-waves and inverted T-waves anterolaterally. Blood  pressure is 160/90. He does have some inferior inverted T-waves as well and  small inferior Q-waves.   Patient exercised for a total of 7 minutes 48 seconds Bruce protocol stage  III and 10.1 METS. Maximum heart rate is 144 beats per minute, 87% of  predicted maximum. Maximum blood pressure is 164/100, resolved down to  158/72, did not really have much of an increase in his blood pressure with  exercise.   Mr. Klaiber denied any chest discomfort or shortness of breath with  exercise. EKG revealed few PVCs.  Again, he had abnormal electrocardiogram  at baseline.   Final images and results are pending MD review.      Jae Dire, P.A. [facility]      Vida Roller, M.D.  Electronically Signed    AB/MEDQ  D:  10/08/2005  T:  10/08/2005  Job:  [id number]

## 2011-01-31 NOTE — Assessment & Plan Note (Signed)
[facility]                       [place] CARDIOLOGY OFFICE NOTE   NAME:House, Adam HAGMANN                   MRN:          [id number]  DATE:11/05/2006                            DOB:          1951/01/31    IDENTIFICATION:  [name] is a 60 year old gentleman. I last saw  him back in January.   When he was last seen, he had some chest discomfort. I was not sure what  it represented. It was not clear it was cardiac. Volume status looked  okay, though he had some dry crackles at the bases. I went ahead and  increased his Atacand to 8 daily.   In the interval, the patient comes back. He actually is recovering from  the flu. He has only been on his medicines for a week. Had to stop it  during the flu season. Says he is taking everything now. Is finishing a  course of antibiotics.   CURRENT MEDICATIONS:  1. Atacand reported 8 mg daily.  2. Flexeril p.r.n.  3. Aspirin 81 mg daily.  4. K-dur 10 mEq every other day.  5. Fish oil.  6. Vytorin 10/20 daily.  7. Toprol XL 50 daily.  8. Z-pak.   PHYSICAL EXAMINATION:  The patient is in no distress. Blood pressure is  144/80, pulse is 88. Weight is 210.  NECK: JVP is normal. No bruits.  LUNGS:  Clear.  CARDIAC: Regular rate and rhythm, S1, S2. No S3 and no murmurs.  EXTREMITIES: No edema.   IMPRESSION:  1. Cardiomyopathy. Volume status looked pretty good. Again, he has      only been on his medicines now about a week. I always wonder too if      he is taking everything fully, though he says he is. I would keep      him on the current regimen for now and not push things. Blood      pressure may not have reached an optimal as he has only been on it      for a short time.  2. Dyslipidemia. Would followup with a fasting lipid panel. He had an      AST/ALT done that was within normal limits. Continue Vytorin for      now.   I will set to see him back in three months, sooner if problems  develop.    Pricilla Riffle, MD, [facility]  Electronically Signed   PVR/MedQ  DD: 11/05/2006  DT: 11/05/2006  Job #: [id number]

## 2011-01-31 NOTE — Assessment & Plan Note (Signed)
[facility]                              CARDIOLOGY OFFICE NOTE   NAME:Adam House, Adam House                   MRN:          [id number]  DATE:06/08/2006                            DOB:          10-19-1950    IDENTIFICATION:  Mr. Pattison is a 60 year old gentleman with a history of  hypertension, CAD (status post PTCA of his LAD), left ventricular  dysfunction with an left ventricular function of 35%.  He was last seen in  the cardiology clinic back in January.  He comes in for return today.   On talking to him, he notes occasional fluttering of his heart that lasts  about 5 minutes.  He has not had any in the past couple weeks.  He did note  that around that time he had significant caffeine intake.   Notes occasional shortness of breath, more with fluttering, not at other  times.  Denies chest pain.   CURRENT MEDICATIONS:  1. Flexeril p.r.n.  2. K-Dur 20 mEq daily.  3. Aspirin 81 mg daily.   REVIEW OF SYSTEMS:  The patient was seen in the medicine clinic and had  lower extremity aching.  Potassium was noted to be low.  Also taken off  Lipitor.   Also of note, the patient had a Myoview scan done in January of 2007.  He  exercised 7 minutes.  The Myoview scan showed moderate fixed defect in the  anterior wall, small fixed defect in the inferior wall.  An left ventricular  function of 33%.   PHYSICAL EXAMINATION:  GENERAL:  The patient is in no distress.  VITAL SIGNS:  Blood pressure 130/96 right arm, 138/100 left arm.  Pulse 88.  Weight 214.  NECK:  JVP is normal.  LUNGS:  Without wheezes.  CARDIAC:  Regular rate and rhythm.  S1 and S2.  No S3.  No significant  murmurs.  ABDOMEN:  Benign.  EXTREMITIES:  No edema.   IMPRESSION:  1. Coronary artery disease.  Appears clinically stable.  His blood      pressure is not controlled, and he is not on an optimal regimen, given      his left ventricular dysfunction.  I would add lisinopril 10  daily to      his regimen, continue on aspirin, continue on K-Dur, check a BMET in 1      weeks time.  Will followup in 4 weeks time.  Would recommend adding a      low-dose beta blocker to his therapy.  2. Dyslipidemia.  Will add Crestor 10 mg.  I am not sure what type of      reaction he had from the Lipitor or if it was related to the potassium,      but will followup then on Crestor.  3. Palpitations.  Not sure what this represents.  If he has any      recurrence, we will set him up for an event monitor.  Question if      related to any potassium problems in the past.  Pricilla Riffle, MD, [facility]    PVR/MedQ  DD:  06/08/2006  DT:  06/10/2006  Job #:  [id number]

## 2011-01-31 NOTE — Assessment & Plan Note (Signed)
[facility]                       [place] CARDIOLOGY OFFICE NOTE   NAME:House, [name]                   MRN:          [id number]  DATE:10/02/2006                            DOB:          Apr 06, 1951    IDENTIFICATION:  [name] is a 60 year old gentleman with  hypertension, CAD, LV dysfunction with an LVF of 33% by Myoview scan.  I  last saw him in December.   When I saw him last, I added Atacand to his regimen.  The patient says  he is taking this.  Denies dizziness.   Note:  Sunday he was lying in bed when he developed some tingling in his  chest.  He took an aspirin.  The episode eased off in about 45 minutes.  Denies chest pain at other times.  This may have been similar to before  his angioplasty.   CURRENT MEDICATIONS:  By report the patient says he is taking Flexeril  p.r.n., aspirin 81 mg daily, K-Dur 10 mEq q.o.d., fish oil daily,  Vytorin 10/20 daily, Toprol XL 50 daily, Atacand 4.   PHYSICAL EXAMINATION:  On exam, the patient is in no distress.  Blood  pressure is 140/80, pulse is 78, weight 216.  LUNGS:  Clear.  NECK:  JVP is normal.  LUNGS:  Mild crackles at the bases.  CARDIAC:  Regular rate and rhythm, S1, S2, no S3, no murmurs.  ABDOMEN:  Benign.  EXTREMITIES:  No edema.   IMPRESSION:  1. Coronary artery disease, episode of discomfort, tingling.  I am not      sure what it represented.  He is not having any other spells.  I      told him to take a nitroglycerine if he ever has a recurrence.  If      it does not go away, take 2 and call 911.  2. On exam, he has some dry crackles at the bases but otherwise lungs      are clear.  I think his volume status appears pretty good, appetite      is good, breathing is okay.  Would continue on regimen and increase      Atacand to 8 and follow up in one month's time.  3. Dyslipidemia.  Given him more samples of Vytorin.  Blood work in 3      weeks, again with followup in  4.   LABORATORY DATA:  Done December 27:  Potassium of 3.9, glucose of 103.     Pricilla Riffle, MD, [facility]  Electronically Signed    PVR/MedQ  DD: 10/02/2006  DT: 10/02/2006  Job #: [id number]

## 2011-01-31 NOTE — Assessment & Plan Note (Signed)
[facility]                              CARDIOLOGY OFFICE NOTE   NAME:Marciel, RODERICK CALO                   MRN:          [id number]  DATE:07/24/2006                            DOB:          Apr 14, 1951    IDENTIFICATION:  Mr. Tanori is a 61 year old gentleman with hypertension,  CAD (status post PTCA to LAD), left ventricular function  dysfunction with  an LVF of 33% by Myoview scan.  I last saw him back in September.   The patient has not been compliant with medicines, also continues to smoke,  and when I saw him last I added lisinopril to his regimen and come back for  a BMET in one week's time.   He came for the blood work but did not start the lisinopril.   Otherwise his breathing is unchanged.  He gets some shortness of breath but  again no increased frequency, tries to stay active.  Note:  He is no longer  working because of his back, looking for a job now, applying for disability.   Continues to smoke about 10 cigarettes per day.   MEDICATIONS:  1. Flexeril p.r.n.  2. Aspirin 81 mg daily.  3. K-Dur 10 mEq q.o.d.  4. Fish oil 500 daily.  5. Crestor 10 daily.   PHYSICAL EXAMINATION:  The patient is in no distress.  Blood pressure 122/82  on arrival.  On my check 150/102.  Pulse is 76, weight 218.  NECK:  JVP is normal.  LUNGS:  Clear to auscultation.  HEART:  Regular rate and rhythm, S1, S2 no S3.  No murmurs.  ABDOMEN:  No hepatomegaly.  EXTREMITIES:  No edema.   IMPRESSION:  1. Coronary artery disease with LV dysfunction.  I discussed with him the      rationale regarding use of ACE inhibitors and beta blockers, would      strongly urge him to try lisinopril 10 mg daily.  He is agreeable to do      this.  We will give him an prescription for 20 he can cut in half to      save money.  Continue on aspirin.  2. Dyslipidemia.  Crestor 10, seems to tolerate.  We will check labs.      Note:  He should some      in in about a week  since he is on potassium, for a BMET.  I will set      followup for about 6 weeks' time.     Pricilla Riffle, MD, [facility]  Electronically Signed    PVR/MedQ  DD: 07/24/2006  DT: 07/24/2006  Job #: [id number]

## 2011-01-31 NOTE — Assessment & Plan Note (Signed)
[facility]                       [place] CARDIOLOGY OFFICE NOTE   NAME:Adam House, Adam House                   MRN:          [id number]  DATE:08/31/2006                            DOB:          12/15/1950    IDENTIFICATION:  [name] is a 60 year old gentleman with  hypertension, CAD (status post PTCA to the LAD), LV dysfunction with an  LV function of 33% by Myoview scan.  I saw him back in early November.   When I saw him last, I told him he should be on an ACE inhibitor and a  beta blocker, and recommended lisinopril 10 daily.  He was agreeable to  doing this, but wrote him a prescription of 20 he could cut in half, but  he has not filled it.  He said he just did not get to it.  It is not  clear if he wanted to pay for it.   Otherwise, he has been doing about the same.  No chest pain.  Breathing  is okay except when he walks the dogs fast, he gives out.  Denies PND.   CURRENT MEDICATIONS:  Flexeril p.r.n., aspirin 81 mg daily, K-Dur 10 mg  every other day, fish oil 500 daily, and Vytorin 10/40 daily.  Had been  on Crestor.  I recommended trying Vytorin 10/40 based on lipids from  November 20.   PHYSICAL EXAM:  The patient is in no distress.  Blood pressure 144/90, pulse 88, weight 217.  LUNGS:  Clear.  NECK:  JVP is normal.  CARDIAC:  Regular rate and rhythm.  S1, S2.  No S3.  ABDOMEN:  Benign.  EXTREMITIES:  No edema.   IMPRESSION:  1. Coronary artery disease.  I have given him samples for Atacand 4.      He will try this.  Come back for samples.  I will see him back in      about a month.  Continue on other meds.  2. Dyslipidemia.  We had samples for Vytorin 10/20, probably in 6      weeks time.  We will need to get a fasting lipid panel again.  I      wonder if he is taking this.  He says he is.  He could be on Zocor,      and that would be generic, as well as lisinopril, and we will need      to discuss this on return.  Overall,  his compliance has not been      the best.     Pricilla Riffle, MD, [facility]  Electronically Signed    PVR/MedQ  DD: 08/31/2006  DT: 08/31/2006  Job #: [id number]

## 2011-02-17 ENCOUNTER — Ambulatory Visit (INDEPENDENT_AMBULATORY_CARE_PROVIDER_SITE_OTHER): Payer: PRIVATE HEALTH INSURANCE | Admitting: Cardiology

## 2011-02-17 ENCOUNTER — Encounter: Payer: Self-pay | Admitting: Cardiology

## 2011-02-17 VITALS — BP 138/84 | HR 88 | Ht 69.0 in | Wt 215.0 lb

## 2011-02-17 DIAGNOSIS — I2589 Other forms of chronic ischemic heart disease: Secondary | ICD-10-CM

## 2011-02-17 DIAGNOSIS — I10 Essential (primary) hypertension: Secondary | ICD-10-CM

## 2011-02-17 DIAGNOSIS — I714 Abdominal aortic aneurysm, without rupture, unspecified: Secondary | ICD-10-CM

## 2011-02-17 DIAGNOSIS — E785 Hyperlipidemia, unspecified: Secondary | ICD-10-CM

## 2011-02-17 DIAGNOSIS — I2510 Atherosclerotic heart disease of native coronary artery without angina pectoris: Secondary | ICD-10-CM

## 2011-02-17 DIAGNOSIS — I219 Acute myocardial infarction, unspecified: Secondary | ICD-10-CM

## 2011-02-17 MED ORDER — CHLORTHALIDONE 25 MG PO TABS: 25.0000 mg | ORAL_TABLET | Freq: Every day | ORAL | Status: DC

## 2011-02-17 NOTE — Assessment & Plan Note (Signed)
Ischemic cardiomyopathy. NYHA class IIb. Stable symptoms. Ejection fraction reevaluated your recent hospitalization approximately 35% with significant wall motion abnormality secondary to prior myocardial infarction. Patient is on good medical therapy including ACE inhibitor, beta blocker and Aldactone.
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
## 2011-02-17 NOTE — Patient Instructions (Addendum)
   Begin Chlorthalidone 25mg  daily Your physician wants you to follow up in: 6 months.  You will receive a reminder letter in the mail one-two months in advance.  If you don't receive a letter, please call our office to schedule the follow up appointment

## 2011-02-17 NOTE — Assessment & Plan Note (Signed)
Myalgias secondary to high-dose Lipitor. Patient was changed to Crestor which seems to be tolerating. I've asked him to followup his laboratory work with his primary care physician.

## 2011-02-17 NOTE — Assessment & Plan Note (Signed)
Blood pressure poorly controlled and we will have chlorthalidone 25 mg by mouth daily to his medical regimen. The patient will followup his blood pressure readings at [facility]. Have also ordered an electrolyte panel to followup on his renal function and potassium after the addition of a diuretic. I also instructed to reduce his salt intake.

## 2011-02-17 NOTE — Assessment & Plan Note (Signed)
Ejection fraction stable at 35%.

## 2011-02-17 NOTE — Assessment & Plan Note (Signed)
No evidence of AAA by screening ultrasound.

## 2011-02-18 ENCOUNTER — Telehealth: Payer: Self-pay | Admitting: *Deleted

## 2011-02-18 ENCOUNTER — Encounter: Payer: Self-pay | Admitting: *Deleted

## 2011-02-18 DIAGNOSIS — I10 Essential (primary) hypertension: Secondary | ICD-10-CM

## 2011-02-18 DIAGNOSIS — Z79899 Other long term (current) drug therapy: Secondary | ICD-10-CM

## 2011-02-18 NOTE — Telephone Encounter (Signed)
Pt mailed letter to notify him of need for BMET in 10 days from OV.

## 2011-02-21 ENCOUNTER — Other Ambulatory Visit: Payer: Self-pay | Admitting: *Deleted

## 2011-02-21 DIAGNOSIS — I2589 Other forms of chronic ischemic heart disease: Secondary | ICD-10-CM

## 2011-02-21 DIAGNOSIS — F17200 Nicotine dependence, unspecified, uncomplicated: Secondary | ICD-10-CM

## 2011-02-21 DIAGNOSIS — I10 Essential (primary) hypertension: Secondary | ICD-10-CM

## 2011-02-21 DIAGNOSIS — I2510 Atherosclerotic heart disease of native coronary artery without angina pectoris: Secondary | ICD-10-CM

## 2011-02-21 MED ORDER — PANTOPRAZOLE SODIUM 40 MG PO TBEC: 40.0000 mg | DELAYED_RELEASE_TABLET | Freq: Every day | ORAL | Status: DC

## 2011-03-05 ENCOUNTER — Telehealth: Payer: Self-pay | Admitting: *Deleted

## 2011-03-05 NOTE — Telephone Encounter (Signed)
Message copied by Arlyss Gandy on Wed Mar 05, 2011 10:11 AM ------      Message from: Learta Codding      Created: Tue Mar 04, 2011  5:11 PM       Renal function slightly worsened. Followup labs in 3 months patient also need to discuss this with primary care physician.

## 2011-03-05 NOTE — Telephone Encounter (Signed)
Received request for chantix rx from [facility]. MD informed and advised patient to request this from PCP. Patient informed and verbalized understanding of plan.

## 2011-03-05 NOTE — Telephone Encounter (Signed)
Left message to call back on voicemail regarding lab results. (Will need reminder placed for repeat labs in 3 months and will need current lab report faxed to primary MD.)

## 2011-03-18 NOTE — Telephone Encounter (Signed)
Patient notified of below.  States he will schedule OV with [facility] for next week & will discuss with them at that time.  Will have them follow up on this.  Lab faxed to PMD.

## 2011-04-10 ENCOUNTER — Ambulatory Visit (INDEPENDENT_AMBULATORY_CARE_PROVIDER_SITE_OTHER): Payer: PRIVATE HEALTH INSURANCE | Admitting: Internal Medicine

## 2011-04-10 DIAGNOSIS — I2589 Other forms of chronic ischemic heart disease: Secondary | ICD-10-CM

## 2011-04-10 DIAGNOSIS — I519 Heart disease, unspecified: Secondary | ICD-10-CM
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
## 2011-04-10 NOTE — Assessment & Plan Note (Signed)
Normal ICD function See Arita Miss Art report No changes today  [place] checks every 3 months Return to device clinic in 12 months

## 2011-05-15 ENCOUNTER — Encounter: Payer: PRIVATE HEALTH INSURANCE | Admitting: *Deleted

## 2011-05-28 ENCOUNTER — Other Ambulatory Visit: Payer: Self-pay | Admitting: Cardiology

## 2011-07-10 ENCOUNTER — Other Ambulatory Visit: Payer: Self-pay

## 2011-07-10 ENCOUNTER — Ambulatory Visit (INDEPENDENT_AMBULATORY_CARE_PROVIDER_SITE_OTHER): Payer: PRIVATE HEALTH INSURANCE | Admitting: *Deleted

## 2011-07-10 ENCOUNTER — Encounter: Payer: Self-pay | Admitting: Internal Medicine

## 2011-07-10 DIAGNOSIS — I428 Other cardiomyopathies: Secondary | ICD-10-CM

## 2011-07-10 DIAGNOSIS — Z95810 Presence of automatic (implantable) cardiac defibrillator: Secondary | ICD-10-CM

## 2011-07-13 LAB — REMOTE ICD DEVICE
AL IMPEDENCE ICD: 633 Ohm
ATRIAL PACING ICD: 0 pct
CHARGE TIME: 8.8 s
DEVICE MODEL ICD: 147011
RV LEAD AMPLITUDE: 25 mv
RV LEAD IMPEDENCE ICD: 558 Ohm
TOT-0006: 20120726000000
TZAT-0001FASTVT: 2
TZAT-0001SLOWVT: 2
TZAT-0013FASTVT: 4
TZAT-0013SLOWVT: 4
TZAT-0018FASTVT: NEGATIVE
TZON-0003FASTVT: 300 ms
TZON-0003SLOWVT: 352.9 ms
TZST-0001FASTVT: 5
TZST-0001FASTVT: 7
TZST-0001FASTVT: 8
TZST-0001SLOWVT: 4
TZST-0001SLOWVT: 5
TZST-0001SLOWVT: 6
TZST-0003FASTVT: 41 J
TZST-0003FASTVT: 41 J
TZST-0003FASTVT: 41 J
TZST-0003SLOWVT: 41 J
TZST-0003SLOWVT: 41 J
TZST-0003SLOWVT: 41 J

## 2011-07-18 NOTE — Progress Notes (Signed)
icd remote check  

## 2011-08-25 ENCOUNTER — Other Ambulatory Visit: Payer: Self-pay | Admitting: *Deleted

## 2011-08-25 DIAGNOSIS — F17200 Nicotine dependence, unspecified, uncomplicated: Secondary | ICD-10-CM

## 2011-08-25 DIAGNOSIS — I2510 Atherosclerotic heart disease of native coronary artery without angina pectoris: Secondary | ICD-10-CM

## 2011-08-25 DIAGNOSIS — I2589 Other forms of chronic ischemic heart disease: Secondary | ICD-10-CM

## 2011-08-25 DIAGNOSIS — I10 Essential (primary) hypertension: Secondary | ICD-10-CM

## 2011-08-25 MED ORDER — LISINOPRIL 20 MG PO TABS: 20.0000 mg | ORAL_TABLET | Freq: Every day | ORAL | Status: DC

## 2011-08-25 MED ORDER — METOPROLOL SUCCINATE ER 100 MG PO TB24: 100.0000 mg | ORAL_TABLET | Freq: Every day | ORAL | Status: DC

## 2011-09-01 ENCOUNTER — Ambulatory Visit (INDEPENDENT_AMBULATORY_CARE_PROVIDER_SITE_OTHER): Payer: PRIVATE HEALTH INSURANCE | Admitting: Cardiology

## 2011-09-01 ENCOUNTER — Encounter: Payer: Self-pay | Admitting: Cardiology

## 2011-09-01 VITALS — BP 137/82 | HR 79 | Ht 69.0 in | Wt 210.0 lb

## 2011-09-01 DIAGNOSIS — Z95810 Presence of automatic (implantable) cardiac defibrillator: Secondary | ICD-10-CM

## 2011-09-01 DIAGNOSIS — F17200 Nicotine dependence, unspecified, uncomplicated: Secondary | ICD-10-CM

## 2011-09-01 DIAGNOSIS — I2589 Other forms of chronic ischemic heart disease: Secondary | ICD-10-CM

## 2011-09-01 DIAGNOSIS — I714 Abdominal aortic aneurysm, without rupture: Secondary | ICD-10-CM

## 2011-09-01 DIAGNOSIS — E785 Hyperlipidemia, unspecified: Secondary | ICD-10-CM

## 2011-09-01 DIAGNOSIS — N19 Unspecified kidney failure: Secondary | ICD-10-CM

## 2011-09-01 NOTE — Progress Notes (Signed)
Adam Bottoms, MD, [facility] ABIM Board Certified in Adult Cardiovascular Medicine,Internal Medicine and Critical Care Medicine    CC: Routine followup patient coronary artery disease  HPI:  The patient is a 60 year old male with a history of ischemic cardiomyopathy, ejection fraction 30-35% status post dual-chamber ICD implantation. The patient status post prior myocardial infarction in 1998. He had a CVA in March of 2012 with carotid Dopplers that showed moderate disease. He is  on maximal heart failure therapy. He is clinically doing quite well. He reports a relatively normal quality of life. He denies any chest pain or shortness of breath orthopnea PND. He also has no further myalgias all statins since he has been switched to Crestor. He doesn't renal dysfunction which is followed by his nephrologist Reportedly his potassium has remained stable spironolactone. He is just recovering from a bout of gout for which he received steroids. He has no further cardiovascular complaints.      PMH: reviewed and listed in Problem List in Electronic Records (and see below) Past Medical History  Diagnosis Date  . CAD (coronary artery disease)     s/p PCI after MI 1998  . Congestive heart failure, unspecified     NYHA Class II/III  . Ischemic cardiomyopathy     CM (EF 30-35%)  . CRI (chronic renal insufficiency)   . Unspecified essential hypertension   . Impotence of organic origin   . 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 Esophageal reflux   . 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 Tobacco use disorder   . Automatic implantable cardiac defibrillator in situ    Past Surgical History  Procedure Date  . Cervical disc surgery 2007    DUE TO ACCIDENT  . Icd implantation 2011    [employer]  he is a MADIT RIT study patient  . Coronary stenting      Allergies/SH/FHX :  available in Electronic Records for review  Medications: Current Outpatient Prescriptions  Medication Sig Dispense Refill  . amLODipine (NORVASC) 10 MG tablet Take 10 mg by mouth daily.        [name] aspirin 325 MG tablet Take 325 mg by mouth daily.        . chlorthalidone (HYGROTON) 25 MG tablet Take 1 tablet (25 mg total) by mouth daily.  30 tablet  6  . colchicine 0.6 MG tablet Take 0.6 mg by mouth daily.        [name] lisinopril (PRINIVIL,ZESTRIL) 20 MG tablet Take 1 tablet (20 mg total) by mouth daily.  30 tablet  6  . metoprolol (TOPROL XL) 100 MG 24 hr tablet Take 1 tablet (100 mg total) by mouth daily.  30 tablet  6  . Omega-3 Fatty Acids (FISH OIL) 1200 MG CAPS Take 1 capsule by mouth daily.       . pantoprazole (PROTONIX) 40 MG tablet Take 1 tablet (40 mg total) by mouth daily.  90 tablet  3  . predniSONE (DELTASONE) 10 MG tablet Take 10 mg by mouth as directed.        . rosuvastatin (CRESTOR) 10 MG tablet Take 1 tablet (10 mg total) by mouth at bedtime.  30 tablet  6  . spironolactone (ALDACTONE) 25 MG tablet Take 25 mg by mouth daily.         ROS: No nausea or vomiting. No fever or chills.No melena or hematochezia.No  bleeding.No claudication  Physical Exam: BP 137/82  Pulse 79  Ht 5\' 9"  (1.753 m)  Wt 210 lb (95.255 kg)  BMI 31.01 kg/m2 General:well-nourished African American male in no distress Neck: Normal carotid upstroke and no carotid bruits. No thyromegaly nonnodular thyroid. JVP is 5 cm Lungs: Clear breath sounds bilaterally no wheezing Cardiac: Regular rate and rhythm with normal S1-S2. Vascular: No edema. Normal distal pulses Skin: Warm and dry  12lead ECG: Not obtained Limited bedside ECHO:N/A   Assessment and Plan

## 2011-09-01 NOTE — Assessment & Plan Note (Signed)
Patient was again counseled during this clinic visit to stop smoking

## 2011-09-01 NOTE — Assessment & Plan Note (Signed)
Status post ICD placement with no discharges.

## 2011-09-01 NOTE — Assessment & Plan Note (Signed)
Ejection fraction stable at 30-35%. The patient is on optimal heart failure therapy with an ACE inhibitor beta blocker and spironolactone. He is in NYHA class II heart failure. He appears euvolemic. No further adjustments in medications are required.

## 2011-09-01 NOTE — Patient Instructions (Signed)
Continue all current medications. Your physician wants you to follow up in: 6 months.  You will receive a reminder letter in the mail one-two months in advance.  If you don't receive a letter, please call our office to schedule the follow up appointment   

## 2011-09-01 NOTE — Assessment & Plan Note (Signed)
Followed by nephrology with stable potassium levels on spironolactone

## 2011-09-01 NOTE — Assessment & Plan Note (Signed)
Previously screened for AAA. Within normal limits.

## 2011-09-01 NOTE — Assessment & Plan Note (Signed)
Patient tolerating Crestor.

## 2011-10-16 ENCOUNTER — Ambulatory Visit (INDEPENDENT_AMBULATORY_CARE_PROVIDER_SITE_OTHER): Payer: PRIVATE HEALTH INSURANCE | Admitting: *Deleted

## 2011-10-16 ENCOUNTER — Encounter: Payer: Self-pay | Admitting: Internal Medicine

## 2011-10-16 DIAGNOSIS — I2589 Other forms of chronic ischemic heart disease: Secondary | ICD-10-CM

## 2011-10-16 DIAGNOSIS — Z95810 Presence of automatic (implantable) cardiac defibrillator: Secondary | ICD-10-CM

## 2011-10-18 LAB — REMOTE ICD DEVICE
DEVICE MODEL ICD: 147011
FVT: 0
HV IMPEDENCE: 59 Ohm
PACEART VT: 0
RV LEAD IMPEDENCE ICD: 511 Ohm
TZAT-0001FASTVT: 1
TZAT-0001SLOWVT: 1
TZAT-0013FASTVT: 4
TZAT-0013SLOWVT: 4
TZAT-0013SLOWVT: 4
TZAT-0018FASTVT: NEGATIVE
TZAT-0018SLOWVT: NEGATIVE
TZON-0003FASTVT: 300 ms
TZON-0003SLOWVT: 352.9 ms
TZST-0001FASTVT: 5
TZST-0001FASTVT: 6
TZST-0001SLOWVT: 3
TZST-0001SLOWVT: 4
TZST-0001SLOWVT: 6
TZST-0003FASTVT: 41 J
TZST-0003FASTVT: 41 J
TZST-0003SLOWVT: 41 J
TZST-0003SLOWVT: 41 J

## 2011-10-22 ENCOUNTER — Encounter: Payer: Self-pay | Admitting: *Deleted

## 2011-10-22 NOTE — Progress Notes (Signed)
Remote defib check  

## 2012-01-15 ENCOUNTER — Encounter: Payer: Self-pay | Admitting: Internal Medicine

## 2012-01-15 ENCOUNTER — Ambulatory Visit (INDEPENDENT_AMBULATORY_CARE_PROVIDER_SITE_OTHER): Payer: PRIVATE HEALTH INSURANCE | Admitting: *Deleted

## 2012-01-15 DIAGNOSIS — I519 Heart disease, unspecified: Secondary | ICD-10-CM

## 2012-01-15 DIAGNOSIS — I2589 Other forms of chronic ischemic heart disease: Secondary | ICD-10-CM

## 2012-01-20 LAB — REMOTE ICD DEVICE
AL AMPLITUDE: 6.9 mv
ATRIAL PACING ICD: 0 pct
CHARGE TIME: 8.8 s
DEV-0020ICD: NEGATIVE
PACEART VT: 0
RV LEAD AMPLITUDE: 25 mv
TZAT-0001SLOWVT: 2
TZAT-0013FASTVT: 4
TZAT-0013SLOWVT: 4
TZAT-0018FASTVT: NEGATIVE
TZAT-0018SLOWVT: NEGATIVE
TZST-0001FASTVT: 3
TZST-0001FASTVT: 4
TZST-0001FASTVT: 5
TZST-0001FASTVT: 8
TZST-0001SLOWVT: 3
TZST-0001SLOWVT: 4
TZST-0001SLOWVT: 7
TZST-0003FASTVT: 41 J
TZST-0003FASTVT: 41 J
TZST-0003FASTVT: 41 J
TZST-0003SLOWVT: 41 J
TZST-0003SLOWVT: 41 J
TZST-0003SLOWVT: 41 J
TZST-0003SLOWVT: 41 J
TZST-0003SLOWVT: 41 J
VENTRICULAR PACING ICD: 0 pct

## 2012-01-23 NOTE — Progress Notes (Signed)
ICD remote 

## 2012-01-30 ENCOUNTER — Encounter: Payer: Self-pay | Admitting: *Deleted

## 2012-03-10 ENCOUNTER — Ambulatory Visit (INDEPENDENT_AMBULATORY_CARE_PROVIDER_SITE_OTHER): Payer: PRIVATE HEALTH INSURANCE | Admitting: Cardiology

## 2012-03-10 ENCOUNTER — Encounter: Payer: Self-pay | Admitting: Cardiology

## 2012-03-10 VITALS — BP 144/88 | HR 79 | Resp 16 | Ht 69.0 in | Wt 211.0 lb

## 2012-03-10 DIAGNOSIS — I714 Abdominal aortic aneurysm, without rupture: Secondary | ICD-10-CM

## 2012-03-10 DIAGNOSIS — N19 Unspecified kidney failure: Secondary | ICD-10-CM

## 2012-03-10 DIAGNOSIS — I2510 Atherosclerotic heart disease of native coronary artery without angina pectoris: Secondary | ICD-10-CM

## 2012-03-10 DIAGNOSIS — I509 Heart failure, unspecified: Secondary | ICD-10-CM

## 2012-03-10 DIAGNOSIS — N289 Disorder of kidney and ureter, unspecified: Secondary | ICD-10-CM

## 2012-03-10 DIAGNOSIS — Z95810 Presence of automatic (implantable) cardiac defibrillator: Secondary | ICD-10-CM

## 2012-03-10 DIAGNOSIS — I519 Heart disease, unspecified: Secondary | ICD-10-CM

## 2012-03-10 DIAGNOSIS — I2589 Other forms of chronic ischemic heart disease: Secondary | ICD-10-CM

## 2012-03-10 DIAGNOSIS — N529 Male erectile dysfunction, unspecified: Secondary | ICD-10-CM

## 2012-03-10 MED ORDER — SILDENAFIL CITRATE 25 MG PO TABS: ORAL_TABLET | ORAL | Status: DC

## 2012-03-10 NOTE — Assessment & Plan Note (Signed)
No evidence on prior imaging.

## 2012-03-10 NOTE — Assessment & Plan Note (Signed)
I gave the patient a prescription of Viagra. He is aware not to use nitrates in conjunction

## 2012-03-10 NOTE — Patient Instructions (Signed)
   Labs:  CBC, BMET, & BNP  Office will contact with results Your physician wants you to follow up in: 6 months.  You will receive a reminder letter in the mail one-two months in advance.  If you don't receive a letter, please call our office to schedule the follow up appointment

## 2012-03-10 NOTE — Assessment & Plan Note (Signed)
Followed by Dr. Allred. 

## 2012-03-10 NOTE — Progress Notes (Signed)
Adam Bottoms, MD, [facility] ABIM Board Certified in Adult Cardiovascular Medicine,Internal Medicine and Critical Care Medicine    CC: Followup patient ischemic cardiomyopathy.  HPI:  The patient is a 61 year old male with ischemic cardiomyopathy, ejection fraction 30-35% status post ICD implantation for primary prevention of sudden death. From a cardiac standpoint has been doing well. He required no admissions for heart failure since his last office visit. He denies any chest pain or shortness of breath. Is able to walk up a flight is stairs without too much difficulty. His in NYHA class II. He reports no orthopnea PND. He has no palpitations. He reports no defibrillator discharges  PMH: reviewed and listed in Problem List in Electronic Records (and see below) Past Medical History  Diagnosis Date  . CAD (coronary artery disease)     s/p PCI after MI 1998  . Congestive heart failure, unspecified     NYHA Class II/III  . Ischemic cardiomyopathy     CM (EF 30-35%)  . CRI (chronic renal insufficiency)   . Unspecified essential hypertension   . Impotence of organic origin   . 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 Esophageal reflux   . 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 Tobacco use disorder   . Automatic implantable cardiac defibrillator in situ    Past Surgical History  Procedure Date  . Cervical disc surgery 2007    DUE TO ACCIDENT  . Icd implantation 2011    [employer]  he is a MADIT RIT study patient  . Coronary stenting     Allergies/SH/FHX : available in Electronic Records for review  Allergies  Allergen Reactions  . Naproxen    History   Social History  . Marital Status: Married    Spouse Name: N/A    Number of Children: N/A  . Years of Education: N/A   Occupational History  . RETIRED    Social History Main Topics  . Smoking status:  Current Everyday Smoker -- 0.8 packs/day for 40 years    Types: Cigarettes  . Smokeless tobacco: Never Used   Comment: cessation advised, he will consider stopping.  . Alcohol Use: Yes     RARE  . Drug Use: Yes      occasional marijuana   . Sexually Active: Not on file   Other Topics Concern  . Not on file   Social History Narrative   Last updated: 07/01/2011Retired and lives in [place].  Previously worked for [employer]. Tobacco Use - 1PPD x 40+ years,  has quit x 3 daysETOH- rareDrugs- occasional marijuana    No family history on file.  Medications: Current Outpatient Prescriptions  Medication Sig Dispense Refill  . albuterol (PROVENTIL HFA;VENTOLIN HFA) 108 (90 BASE) MCG/ACT inhaler Inhale 2 puffs into the lungs every 6 (six) hours as needed.      [name] allopurinol (ZYLOPRIM) 100 MG tablet Take 100 mg by mouth daily.      [name] amLODipine (NORVASC) 10 MG tablet Take 10 mg by mouth daily.        [name] aspirin 325 MG tablet Take 325 mg by mouth daily.        . chlorthalidone (HYGROTON) 25 MG tablet Take 25 mg by mouth daily.      . colchicine 0.6 MG tablet Take 0.6 mg by mouth daily.        [name] lisinopril (  PRINIVIL,ZESTRIL) 20 MG tablet Take 1 tablet (20 mg total) by mouth daily.  30 tablet  6  . metoprolol (TOPROL XL) 100 MG 24 hr tablet Take 1 tablet (100 mg total) by mouth daily.  30 tablet  6  . NON FORMULARY Nicotine lozenges      . Omega-3 Fatty Acids (FISH OIL) 1200 MG CAPS Take 1 capsule by mouth daily.       . pantoprazole (PROTONIX) 40 MG tablet Take 1 tablet (40 mg total) by mouth daily.  90 tablet  3  . predniSONE (DELTASONE) 10 MG tablet Take 10 mg by mouth as directed.        . rosuvastatin (CRESTOR) 10 MG tablet Take 10 mg by mouth daily.      [name] spironolactone (ALDACTONE) 25 MG tablet Take 25 mg by mouth daily.       . sildenafil (VIAGRA) 25 MG tablet May take one tab as needed in 24 hours (max)  10 tablet  2    ROS: No nausea or vomiting. No fever or chills.No melena or  hematochezia.No bleeding.No claudication  Physical Exam: BP 144/88  Pulse 79  Resp 16  Ht 5\' 9"  (1.753 m)  Wt 211 lb (95.709 kg)  BMI 31.16 kg/m2 General: Well-nourished African American male in no distress. Neck: Normal carotid upstroke no carotid bruits. No thyromegaly nonnodular thyroid. JVP is 6-7 cm Lungs: Clear breath sounds bilaterally no wheezing. Cardiac: Regular rate and rhythm with normal S1-S2 no murmurs rubs or gallops. Vascular: No edema. Normal distal pulses Skin: Warm and dry Physcologic: Normal affect  12lead ECG: Normal sinus rhythm inferolateral Q waves no changes compared to prior EKGs. Limited bedside ECHO:N/A No images are attached to the encounter.   Assessment and Plan  AAA No evidence on prior imaging.  CARDIOMYOPATHY, ISCHEMIC Stable ischemic cardiomyopathy. The patient reports no chest pain. His in NYHA class II.  ERECTILE DYSFUNCTION, ORGANIC I gave the patient a prescription of Viagra. He is aware not to use nitrates in conjunction  Chronic systolic dysfunction of left ventricle The patient has remained euvolemic. He has had no hospitalizations since his last visit for congestive heart failure.  IMPLANTATION OF DEFIBRILLATOR, HX OF Followed by Dr. Johney Frame.  RENAL FAILURE Patient needs followup blood work. I ordered a CBC, BMET and BNP level. His weight has remained stable.   Patient Active Problem List  Diagnosis  . Other and unspecified hyperlipidemia  . NICOTINE ADDICTION  . ESSENTIAL HYPERTENSION, BENIGN  . AMI  . CAD  . CARDIOMYOPATHY, ISCHEMIC  . LEFT VENTRICULAR FUNCTION, DECREASED  . AAA  . RENAL FAILURE  . ERECTILE DYSFUNCTION, ORGANIC  . Chronic systolic dysfunction of left ventricle  . IMPLANTATION OF DEFIBRILLATOR, HX OF  . Hypertension

## 2012-03-10 NOTE — Assessment & Plan Note (Signed)
The patient has remained euvolemic. He has had no hospitalizations since his last visit for congestive heart failure.

## 2012-03-10 NOTE — Assessment & Plan Note (Signed)
Patient needs followup blood work. I ordered a CBC, BMET and BNP level. His weight has remained stable.

## 2012-03-10 NOTE — Assessment & Plan Note (Signed)
Stable ischemic cardiomyopathy. The patient reports no chest pain. His in NYHA class II.

## 2012-03-15 ENCOUNTER — Ambulatory Visit (INDEPENDENT_AMBULATORY_CARE_PROVIDER_SITE_OTHER): Payer: PRIVATE HEALTH INSURANCE | Admitting: Internal Medicine

## 2012-03-15 ENCOUNTER — Encounter: Payer: Self-pay | Admitting: Internal Medicine

## 2012-03-15 VITALS — BP 133/84 | HR 93 | Ht 69.5 in | Wt 211.8 lb

## 2012-03-15 DIAGNOSIS — I428 Other cardiomyopathies: Secondary | ICD-10-CM

## 2012-03-15 DIAGNOSIS — I2589 Other forms of chronic ischemic heart disease: Secondary | ICD-10-CM

## 2012-03-15 DIAGNOSIS — I2510 Atherosclerotic heart disease of native coronary artery without angina pectoris: Secondary | ICD-10-CM

## 2012-03-15 LAB — ICD DEVICE OBSERVATION
AL AMPLITUDE: 10.1 mv
DEVICE MODEL ICD: 147011
HV IMPEDENCE: 62 Ohm
RV LEAD AMPLITUDE: 25 mv
RV LEAD IMPEDENCE ICD: 534 Ohm
TZAT-0001FASTVT: 2
TZAT-0001SLOWVT: 1
TZAT-0013FASTVT: 4
TZAT-0013SLOWVT: 4
TZAT-0013SLOWVT: 4
TZAT-0018FASTVT: NEGATIVE
TZAT-0018SLOWVT: NEGATIVE
TZON-0003SLOWVT: 352.9 ms
TZST-0001FASTVT: 3
TZST-0001FASTVT: 6
TZST-0001FASTVT: 7
TZST-0001SLOWVT: 3
TZST-0001SLOWVT: 4
TZST-0001SLOWVT: 5
TZST-0001SLOWVT: 6
TZST-0001SLOWVT: 7
TZST-0003FASTVT: 41 J
TZST-0003FASTVT: 41 J
TZST-0003FASTVT: 41 J
TZST-0003FASTVT: 41 J
TZST-0003SLOWVT: 41 J
TZST-0003SLOWVT: 41 J
TZST-0003SLOWVT: 41 J
VENTRICULAR PACING ICD: 1 pct

## 2012-03-15 NOTE — Assessment & Plan Note (Signed)
Normal ICD function See Arita Miss Art report No changes today   [place] checks every 3 months Return to see me in 1 year

## 2012-03-15 NOTE — Assessment & Plan Note (Signed)
No ischemic symptoms No medicine changes today

## 2012-03-15 NOTE — Patient Instructions (Signed)
Continue all current medications. Your physician wants you to follow up in:  1 year.  You will receive a reminder letter in the mail one-two months in advance.  If you don't receive a letter, please call our office to schedule the follow up appointment   

## 2012-03-15 NOTE — Progress Notes (Signed)
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  Since last being seen in our clinic, the patient reports doing very well.   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  He is very pleased with this. Today, he denies symptoms of palpitations, chest pain, shortness of breath,  lower extremity edema, dizziness, presyncope, syncope, or ICD shocks.  The patient is otherwise without complaint today.   Past Medical History  Diagnosis Date  . CAD (coronary artery disease)     s/p PCI after MI 1998  . Congestive heart failure, unspecified     NYHA Class II/III  . Ischemic cardiomyopathy     CM (EF 30-35%)  . CRI (chronic renal insufficiency)   . Unspecified essential hypertension   . Impotence of organic origin   . 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 Esophageal reflux   . 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 Tobacco use disorder   . Automatic implantable cardiac defibrillator in situ    Past Surgical History  Procedure Date  . Cervical disc surgery 2007    DUE TO ACCIDENT  . Icd implantation 2011    [employer]  he is a MADIT RIT study patient  . Coronary stenting     Current Outpatient Prescriptions  Medication Sig Dispense Refill  . albuterol (PROVENTIL HFA;VENTOLIN HFA) 108 (90 BASE) MCG/ACT inhaler Inhale 2 puffs into the lungs every 6 (six) hours as needed.      [name] allopurinol (ZYLOPRIM) 100 MG tablet Take 100 mg by mouth daily.      [name] amLODipine (NORVASC) 10 MG tablet Take 10 mg by mouth daily.        [name] aspirin 325 MG tablet Take 325 mg by mouth daily.        . chlorthalidone (HYGROTON) 25 MG tablet Take 25 mg by mouth daily.      . colchicine 0.6 MG tablet Take  0.6 mg by mouth daily.        [name] lisinopril (PRINIVIL,ZESTRIL) 20 MG tablet Take 1 tablet (20 mg total) by mouth daily.  30 tablet  6  . metoprolol (TOPROL XL) 100 MG 24 hr tablet Take 1 tablet (100 mg total) by mouth daily.  30 tablet  6  . NON FORMULARY Nicotine lozenges      . Omega-3 Fatty Acids (FISH OIL) 1200 MG CAPS Take 1 capsule by mouth daily.       . pantoprazole (PROTONIX) 40 MG tablet Take 1 tablet (40 mg total) by mouth daily.  90 tablet  3  . predniSONE (DELTASONE) 10 MG tablet Take 10 mg by mouth as directed.        . rosuvastatin (CRESTOR) 10 MG tablet Take 10 mg by mouth daily.      . sildenafil (VIAGRA) 25 MG tablet May take one tab as needed in 24 hours (max)  10 tablet  2  . spironolactone (ALDACTONE) 25 MG tablet Take 25 mg by mouth daily.         Physical Exam: Filed Vitals:   03/15/12 0934  BP: 133/84  Pulse: 93  Height: 5' 9.5" (1.765 m)  Weight: 211 lb 12.8 oz (96.072 kg)  GEN- The patient is well appearing, alert and oriented x 3 today.   Head- normocephalic, atraumatic Eyes-  Sclera clear, conjunctiva pink Ears- hearing intact Oropharynx- clear Lungs- Clear to ausculation bilaterally, normal work of breathing Chest- ICD pocket is well healed Heart- Regular rate and rhythm, no murmurs, rubs or gallops, PMI not laterally displaced GI- soft, NT, ND, + BS Extremities- no clubbing, cyanosis, or edema  ICD interrogation- reviewed in detail today,  See PACEART report  Assessment and Plan:

## 2012-04-12 ENCOUNTER — Telehealth: Payer: Self-pay | Admitting: *Deleted

## 2012-04-12 NOTE — Telephone Encounter (Signed)
Message copied by Lesle Chris on Mon Apr 12, 2012  3:14 PM ------      Message from: Prescott Parma C      Created: Mon Apr 12, 2012 12:15 PM       Stable labs

## 2012-04-12 NOTE — Telephone Encounter (Signed)
Notes Recorded by Lesle Chris, LPN on 1/61/0960 at 3:14 PM Patient notified via voice mail.

## 2012-04-21 DIAGNOSIS — R0602 Shortness of breath: Secondary | ICD-10-CM

## 2012-04-22 ENCOUNTER — Other Ambulatory Visit: Payer: Self-pay

## 2012-04-22 ENCOUNTER — Encounter (HOSPITAL_COMMUNITY)
Admission: AD | Disposition: A | Discharge status: Home / Self Care | Payer: Self-pay | Source: Other Acute Inpatient Hospital | Attending: Cardiology

## 2012-04-22 ENCOUNTER — Encounter: Payer: Self-pay | Admitting: Cardiology

## 2012-04-22 ENCOUNTER — Ambulatory Visit (HOSPITAL_COMMUNITY): Admit: 2012-04-22 | Payer: Self-pay | Admitting: Cardiology

## 2012-04-22 ENCOUNTER — Inpatient Hospital Stay (HOSPITAL_COMMUNITY)
Admission: AD | Admit: 2012-04-22 | Discharge: 2012-04-27 | DRG: 246 | Disposition: A | Discharge status: Home / Self Care | Payer: PRIVATE HEALTH INSURANCE | Source: Other Acute Inpatient Hospital | Attending: Cardiology | Admitting: Cardiology

## 2012-04-22 DIAGNOSIS — F17200 Nicotine dependence, unspecified, uncomplicated: Secondary | ICD-10-CM

## 2012-04-22 DIAGNOSIS — I2589 Other forms of chronic ischemic heart disease: Secondary | ICD-10-CM | POA: Diagnosis present

## 2012-04-22 DIAGNOSIS — I5043 Acute on chronic combined systolic (congestive) and diastolic (congestive) heart failure: Secondary | ICD-10-CM | POA: Diagnosis present

## 2012-04-22 DIAGNOSIS — Y9210 Unspecified residential institution as the place of occurrence of the external cause: Secondary | ICD-10-CM | POA: Diagnosis not present

## 2012-04-22 DIAGNOSIS — N182 Chronic kidney disease, stage 2 (mild): Secondary | ICD-10-CM | POA: Diagnosis present

## 2012-04-22 DIAGNOSIS — R220 Localized swelling, mass and lump, head: Secondary | ICD-10-CM | POA: Diagnosis not present

## 2012-04-22 DIAGNOSIS — I5023 Acute on chronic systolic (congestive) heart failure: Secondary | ICD-10-CM

## 2012-04-22 DIAGNOSIS — I509 Heart failure, unspecified: Secondary | ICD-10-CM | POA: Diagnosis present

## 2012-04-22 DIAGNOSIS — Z955 Presence of coronary angioplasty implant and graft: Secondary | ICD-10-CM

## 2012-04-22 DIAGNOSIS — Z4502 Encounter for adjustment and management of automatic implantable cardiac defibrillator: Secondary | ICD-10-CM

## 2012-04-22 DIAGNOSIS — I319 Disease of pericardium, unspecified: Secondary | ICD-10-CM | POA: Diagnosis present

## 2012-04-22 DIAGNOSIS — I219 Acute myocardial infarction, unspecified: Secondary | ICD-10-CM

## 2012-04-22 DIAGNOSIS — I10 Essential (primary) hypertension: Secondary | ICD-10-CM | POA: Diagnosis present

## 2012-04-22 DIAGNOSIS — I129 Hypertensive chronic kidney disease with stage 1 through stage 4 chronic kidney disease, or unspecified chronic kidney disease: Secondary | ICD-10-CM | POA: Diagnosis present

## 2012-04-22 DIAGNOSIS — T46905A Adverse effect of unspecified agents primarily affecting the cardiovascular system, initial encounter: Secondary | ICD-10-CM | POA: Diagnosis not present

## 2012-04-22 DIAGNOSIS — K219 Gastro-esophageal reflux disease without esophagitis: Secondary | ICD-10-CM | POA: Diagnosis present

## 2012-04-22 DIAGNOSIS — I213 ST elevation (STEMI) myocardial infarction of unspecified site: Secondary | ICD-10-CM

## 2012-04-22 DIAGNOSIS — I517 Cardiomegaly: Secondary | ICD-10-CM

## 2012-04-22 DIAGNOSIS — I2510 Atherosclerotic heart disease of native coronary artery without angina pectoris: Secondary | ICD-10-CM | POA: Diagnosis present

## 2012-04-22 DIAGNOSIS — Z9861 Coronary angioplasty status: Secondary | ICD-10-CM

## 2012-04-22 DIAGNOSIS — N179 Acute kidney failure, unspecified: Secondary | ICD-10-CM | POA: Diagnosis present

## 2012-04-22 DIAGNOSIS — I2109 ST elevation (STEMI) myocardial infarction involving other coronary artery of anterior wall: Principal | ICD-10-CM | POA: Diagnosis present

## 2012-04-22 DIAGNOSIS — Z95810 Presence of automatic (implantable) cardiac defibrillator: Secondary | ICD-10-CM

## 2012-04-22 HISTORY — PX: PERCUTANEOUS CORONARY STENT INTERVENTION (PCI-S): SHX5485

## 2012-04-22 HISTORY — PX: LEFT HEART CATHETERIZATION WITH CORONARY ANGIOGRAM: SHX5451

## 2012-04-22 LAB — CBC
Hemoglobin: 16.5 g/dL (ref 13.0–17.0)
MCH: 28.4 pg (ref 26.0–34.0)
MCV: 78.9 fL (ref 78.0–100.0)
RBC: 5.82 MIL/uL — ABNORMAL HIGH (ref 4.22–5.81)

## 2012-04-22 LAB — BASIC METABOLIC PANEL
BUN: 9 mg/dL (ref 6–23)
CO2: 24 mEq/L (ref 19–32)
Chloride: 98 mEq/L (ref 96–112)
Creatinine, Ser: 1.13 mg/dL (ref 0.50–1.35)
Glucose, Bld: 122 mg/dL — ABNORMAL HIGH (ref 70–99)
Potassium: 3.6 mEq/L (ref 3.5–5.1)

## 2012-04-22 LAB — CARDIAC PANEL(CRET KIN+CKTOT+MB+TROPI)
Relative Index: 15.3 — ABNORMAL HIGH (ref 0.0–2.5)
Relative Index: 14.1 — ABNORMAL HIGH (ref 0.0–2.5)
Relative Index: 12.9 — ABNORMAL HIGH (ref 0.0–2.5)
Total CK: 2702 U/L — ABNORMAL HIGH (ref 7–232)
Troponin I: >20 ng/mL (ref <0.30)
Troponin I: >20 ng/mL (ref <0.30)

## 2012-04-22 SURGERY — LEFT HEART CATHETERIZATION WITH CORONARY ANGIOGRAM
Anesthesia: LOCAL

## 2012-04-22 MED ORDER — ACETAMINOPHEN 325 MG PO TABS: 650.0000 mg | ORAL_TABLET | ORAL | Status: DC | PRN

## 2012-04-22 MED ORDER — MIDAZOLAM HCL 2 MG/2ML IJ SOLN
INTRAMUSCULAR | Status: AC
  Filled 2012-04-22: qty 2

## 2012-04-22 MED ORDER — METOPROLOL TARTRATE 25 MG PO TABS
25.0000 mg | ORAL_TABLET | Freq: Once | ORAL | Status: AC
  Administered 2012-04-22: 25 mg via ORAL
  Filled 2012-04-22: qty 1

## 2012-04-22 MED ORDER — COLCHICINE 0.6 MG PO TABS
0.6000 mg | ORAL_TABLET | Freq: Every day | ORAL | Status: DC
  Administered 2012-04-22 – 2012-04-27 (×6): 0.6 mg via ORAL
  Filled 2012-04-22 (×6): qty 1

## 2012-04-22 MED ORDER — SODIUM CHLORIDE 0.9 % IJ SOLN
3.0000 mL | INTRAMUSCULAR | Status: DC | PRN
  Administered 2012-04-23: 3 mL via INTRAVENOUS

## 2012-04-22 MED ORDER — SPIRONOLACTONE 25 MG PO TABS
25.0000 mg | ORAL_TABLET | Freq: Every day | ORAL | Status: DC
  Administered 2012-04-22 – 2012-04-25 (×4): 25 mg via ORAL
  Filled 2012-04-22 (×5): qty 1

## 2012-04-22 MED ORDER — NICOTINE 7 MG/24HR TD PT24
7.0000 mg | MEDICATED_PATCH | Freq: Every day | TRANSDERMAL | Status: DC
  Administered 2012-04-22 – 2012-04-27 (×6): 7 mg via TRANSDERMAL
  Filled 2012-04-22 (×6): qty 1

## 2012-04-22 MED ORDER — NITROGLYCERIN 0.2 MG/ML ON CALL CATH LAB
INTRAVENOUS | Status: AC
  Filled 2012-04-22: qty 1

## 2012-04-22 MED ORDER — PANTOPRAZOLE SODIUM 40 MG PO TBEC
40.0000 mg | DELAYED_RELEASE_TABLET | Freq: Every day | ORAL | Status: DC
  Administered 2012-04-22 – 2012-04-26 (×5): 40 mg via ORAL
  Filled 2012-04-22 (×5): qty 1

## 2012-04-22 MED ORDER — ATORVASTATIN CALCIUM 40 MG PO TABS
40.0000 mg | ORAL_TABLET | Freq: Every day | ORAL | Status: DC
  Administered 2012-04-22 – 2012-04-26 (×5): 40 mg via ORAL
  Filled 2012-04-22 (×6): qty 1

## 2012-04-22 MED ORDER — BIVALIRUDIN 250 MG IV SOLR
INTRAVENOUS | Status: AC
  Filled 2012-04-22: qty 250

## 2012-04-22 MED ORDER — FUROSEMIDE 10 MG/ML IJ SOLN
INTRAMUSCULAR | Status: AC
  Filled 2012-04-22: qty 4

## 2012-04-22 MED ORDER — ALLOPURINOL 100 MG PO TABS
100.0000 mg | ORAL_TABLET | Freq: Every day | ORAL | Status: DC
  Administered 2012-04-22 – 2012-04-27 (×6): 100 mg via ORAL
  Filled 2012-04-22 (×6): qty 1

## 2012-04-22 MED ORDER — FENTANYL CITRATE 0.05 MG/ML IJ SOLN
INTRAMUSCULAR | Status: AC
  Filled 2012-04-22: qty 2

## 2012-04-22 MED ORDER — CLOPIDOGREL BISULFATE 75 MG PO TABS
75.0000 mg | ORAL_TABLET | Freq: Every day | ORAL | Status: DC
  Administered 2012-04-22 – 2012-04-27 (×6): 75 mg via ORAL
  Filled 2012-04-22 (×7): qty 1

## 2012-04-22 MED ORDER — SODIUM CHLORIDE 0.9 % IV SOLN: INTRAVENOUS | Status: AC

## 2012-04-22 MED ORDER — LIDOCAINE HCL (PF) 1 % IJ SOLN
INTRAMUSCULAR | Status: AC
  Filled 2012-04-22: qty 30

## 2012-04-22 MED ORDER — POTASSIUM CHLORIDE CRYS ER 20 MEQ PO TBCR
20.0000 meq | EXTENDED_RELEASE_TABLET | Freq: Once | ORAL | Status: AC
  Administered 2012-04-22: 20 meq via ORAL
  Filled 2012-04-22: qty 1

## 2012-04-22 MED ORDER — AMLODIPINE BESYLATE 10 MG PO TABS
10.0000 mg | ORAL_TABLET | Freq: Every day | ORAL | Status: DC
  Filled 2012-04-22: qty 1

## 2012-04-22 MED ORDER — CHLORTHALIDONE 25 MG PO TABS
25.0000 mg | ORAL_TABLET | Freq: Every day | ORAL | Status: DC
  Administered 2012-04-23 – 2012-04-25 (×3): 25 mg via ORAL
  Filled 2012-04-22 (×4): qty 1

## 2012-04-22 MED ORDER — SODIUM CHLORIDE 0.9 % IV SOLN: 250.0000 mL | INTRAVENOUS | Status: DC | PRN

## 2012-04-22 MED ORDER — ASPIRIN 325 MG PO TABS
325.0000 mg | ORAL_TABLET | Freq: Every day | ORAL | Status: DC
  Administered 2012-04-22 – 2012-04-27 (×6): 325 mg via ORAL
  Filled 2012-04-22 (×6): qty 1

## 2012-04-22 MED ORDER — SODIUM CHLORIDE 0.9 % IJ SOLN
3.0000 mL | Freq: Two times a day (BID) | INTRAMUSCULAR | Status: DC
  Administered 2012-04-22 – 2012-04-26 (×11): 3 mL via INTRAVENOUS

## 2012-04-22 MED ORDER — HEPARIN (PORCINE) IN NACL 2-0.9 UNIT/ML-% IJ SOLN
INTRAMUSCULAR | Status: AC
  Filled 2012-04-22: qty 2000

## 2012-04-22 MED ORDER — METOPROLOL SUCCINATE ER 100 MG PO TB24
100.0000 mg | ORAL_TABLET | Freq: Every day | ORAL | Status: DC
  Administered 2012-04-22 – 2012-04-27 (×6): 100 mg via ORAL
  Filled 2012-04-22 (×6): qty 1

## 2012-04-22 MED ORDER — ONDANSETRON HCL 4 MG/2ML IJ SOLN: 4.0000 mg | Freq: Four times a day (QID) | INTRAMUSCULAR | Status: DC | PRN

## 2012-04-22 MED ORDER — MORPHINE SULFATE 2 MG/ML IJ SOLN
2.0000 mg | INTRAMUSCULAR | Status: DC | PRN
  Administered 2012-04-23 (×3): 2 mg via INTRAVENOUS
  Filled 2012-04-22 (×3): qty 1

## 2012-04-22 MED ORDER — LISINOPRIL 20 MG PO TABS
20.0000 mg | ORAL_TABLET | Freq: Every day | ORAL | Status: DC
  Administered 2012-04-22 – 2012-04-25 (×4): 20 mg via ORAL
  Filled 2012-04-22 (×5): qty 1

## 2012-04-22 MED ORDER — ALBUTEROL SULFATE HFA 108 (90 BASE) MCG/ACT IN AERS: 2.0000 | INHALATION_SPRAY | Freq: Four times a day (QID) | RESPIRATORY_TRACT | Status: DC | PRN

## 2012-04-22 MED ORDER — SODIUM CHLORIDE 0.9 % IV SOLN
0.2500 mg/kg/h | INTRAVENOUS | Status: DC
  Administered 2012-04-22: 0.25 mg/kg/h via INTRAVENOUS
  Filled 2012-04-22: qty 250

## 2012-04-22 MED FILL — Perflutren Lipid Microsphere IV Susp 6.52 MG/ML: INTRAVENOUS | Qty: 2 | Status: AC

## 2012-04-22 NOTE — Progress Notes (Signed)
34fr sheath aspirated and removed from rfa, manual pressure applied for 20 minutes, bp 101/60 hr 82. No s+s of hematoma , groin level 0. Bedrest instructions given. Distal pulses-bilateral dp palpable. Gauze/tape dressing applied.

## 2012-04-22 NOTE — H&P (Signed)
History and Physical Interval Note:  NAME:  Adam House   MRN: [id number] DOB:  [birth year]   ADMIT DATE: 04/22/2012   04/22/2012 2:24 AM  Adam House is a 61 y.o. male with a PHM of Anterior MI in 1998 - reported stent vs. PTCA of LAD at that time, Ischemic CM s/p AICD in 2011, who continues to smoke.  He is a patient of Dr. Andee Lineman of [facility].   He was in his USOH until ~1PM on 8/7 when he began having substernal chest pressure with shortness of breath.  It initially resolved, but recurred off & on until it came on & persisted up to ~8-10/10 by ~10-11PM.  He went to [facility] ER where his ECG was noted to have Anterior-Lateral STE (~100mm in V3-V5 and I, ~30mm in aVL and V6).  He has known diffuse Anterior and Inferior Q waves.  Code STEMI was called by the ED MD, and he was emergently transported to [facility] for Emergent cardiac catheterization.  He was bolused with IV Heparin and loaded with 600 mg Plavix.  No NTG was administered as he has taken Viagra within ~24 hrs of arrival.   Past Medical History  Diagnosis Date  . CAD (coronary artery disease)     s/p PCI after MI 1998  . Congestive heart failure, unspecified     NYHA Class II/III  . Ischemic cardiomyopathy     CM (EF 30-35%); s/p AICD 2011  . CRI (chronic renal insufficiency)   . Unspecified essential hypertension   . Impotence of organic origin   . 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 Esophageal reflux   . 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 Tobacco use disorder   . Automatic implantable cardiac defibrillator in situ    Past Surgical History  Procedure Date  . Cervical disc surgery 2007    DUE TO ACCIDENT  . Icd implantation 2011    [employer]  he is a MADIT RIT study patient  . Coronary stenting 05/1997    PCI of LAD    FAMHx: No family history on  file.  SOCHx:  reports that he has been smoking Cigarettes.  He has a 16 pack-year smoking history. He has never used smokeless tobacco. He reports that he drinks alcohol. He reports that he uses illicit drugs.  ALLERGIES: Allergies  Allergen Reactions  . Naproxen     HOME MEDICATIONS: Prescriptions prior to admission  Medication Sig Dispense Refill  . albuterol (PROVENTIL HFA;VENTOLIN HFA) 108 (90 BASE) MCG/ACT inhaler Inhale 2 puffs into the lungs every 6 (six) hours as needed.      [name] allopurinol (ZYLOPRIM) 100 MG tablet Take 100 mg by mouth daily.      [name] amLODipine (NORVASC) 10 MG tablet Take 10 mg by mouth daily.        [name] aspirin 325 MG tablet Take 325 mg by mouth daily.        . chlorthalidone (HYGROTON) 25 MG tablet Take 25 mg by mouth daily.      . colchicine 0.6 MG tablet Take 0.6 mg by mouth daily.        [name] lisinopril (PRINIVIL,ZESTRIL) 20 MG tablet Take 1 tablet (20 mg total) by mouth daily.  30 tablet  6  . metoprolol (TOPROL XL) 100 MG 24 hr tablet Take 1 tablet (100 mg total)  by mouth daily.  30 tablet  6  . NON FORMULARY Nicotine lozenges      . Omega-3 Fatty Acids (FISH OIL) 1200 MG CAPS Take 1 capsule by mouth daily.       . pantoprazole (PROTONIX) 40 MG tablet Take 1 tablet (40 mg total) by mouth daily.  90 tablet  3  . predniSONE (DELTASONE) 10 MG tablet Take 10 mg by mouth as directed.        . rosuvastatin (CRESTOR) 10 MG tablet Take 10 mg by mouth daily.      . sildenafil (VIAGRA) 25 MG tablet May take one tab as needed in 24 hours (max)  10 tablet  2  . spironolactone (ALDACTONE) 25 MG tablet Take 25 mg by mouth daily.         PHYSICAL EXAM:There were no vitals taken for this visit. 117/70 mmHg with HR ~70 on arrival to cath lab.  PE performed by admitting Fellow for [place] -- please see his note.  IMPRESSION & PLAN [name] has presented for Emergent Cardiac Catheterization with the diagnosis of Anterior STEMI.  I have reviewed the patients' chart  and labs. The various methods of treatment have been discussed with the patient and family. Questions were answered to the patient's satisfaction  Risks / Complications include, but not limited to: Death, MI, CVA/TIA, VF/VT (with defibrillation), Bradycardia (need for temporary pacer placement), contrast induced nephropathy, bleeding / bruising / hematoma / pseudoaneurysm, vascular or coronary injury (with possible emergent CT or Vascular Surgery), adverse medication reactions, infection.     After consideration of risks, benefits and other options for treatment, the patient has consented to Procedure(s):  LEFT HEART CATHETERIZATION AND CORONARY ANGIOGRAPHY +/- AD HOC PERCUTANEOUS CORONARY INTERVENTION  This procedure has been fully reviewed with the patient and verbal informed consent has been obtained due the Emergent Procedure.  This was witness by Cath Lab staff.   We will proceed with the planned procedure.   HARDING,DAVID W [facility] [address] [place], [place]  [address]  [phone]  04/22/2012 2:24 AM

## 2012-04-22 NOTE — Progress Notes (Signed)
Echocardiogram 2D Echocardiogram with Definity has been performed.  Adam House 04/22/2012, 11:26 AM

## 2012-04-22 NOTE — Care Management Note (Unsigned)
    Page 1 of 1   04/22/2012     2:17:06 PM   CARE MANAGEMENT NOTE 04/22/2012  Patient:  Adam House, Adam House   Account Number:  [id number]  Date Initiated:  04/22/2012  Documentation initiated by:  Flor Whitacre  Subjective/Objective Assessment:   PT S/P ANTERIOR MI WITH URGENT PCI; PTA, PT INDEPENDENT, LIVES WITH SPOUSE.     Action/Plan:   MET WITH PT TO DISCUSS DC PLANS.  WIFE TO PROVIDE CARE AT DC.  WILL FOLLOW FOR HOME NEEDS AS PT PROGRESSES.   Anticipated DC Date:  04/26/2012   Anticipated DC Plan:  HOME W HOME HEALTH SERVICES      DC Planning Services  CM consult      Choice offered to / List presented to:             Status of service:  In process, will continue to follow Medicare Important Message given?   (If response is "NO", the following Medicare IM given date fields will be blank) Date Medicare IM given:   Date Additional Medicare IM given:    Discharge Disposition:    Per UR Regulation:    If discussed at Long Length of Stay Meetings, dates discussed:    Comments:

## 2012-04-22 NOTE — Progress Notes (Signed)
   SUBJECTIVE:  Admitted early this am with anterior MI with urgent PCI.   PHYSICAL EXAM Filed Vitals:   04/22/12 0500 04/22/12 0600 04/22/12 0700 04/22/12 0739  BP: 150/88 137/84 113/71   Pulse: 98 88 85   Temp:    98.8 F (37.1 C)  TempSrc:    Oral  Resp: 30 22 25    Height:      Weight:      SpO2: 98% 96% 93%    General:  No distress Lungs:  Clear Heart:  RRR, no rub, no murmur Abdomen:  Positive bowel sounds, no rebound no guarding Extremities:  Sheath in place, good distal pulses.  LABS: Lab Results  Component Value Date   CKTOTAL 3074* 04/22/2012   CKMB 469.0* 04/22/2012   TROPONINI >20.00* 04/22/2012   Results for orders placed during the hospital encounter of 04/22/12 (from the past 24 hour(s))  MRSA PCR SCREENING     Status: Normal   Collection Time   04/22/12  2:36 AM      Component Value Range   MRSA by PCR NEGATIVE  NEGATIVE  CBC     Status: Abnormal   Collection Time   04/22/12  2:51 AM      Component Value Range   WBC 8.5  4.0 - 10.5 K/uL   RBC 5.82 (*) 4.22 - 5.81 MIL/uL   Hemoglobin 16.5  13.0 - 17.0 g/dL   HCT 40.3  47.4 - 25.9 %   MCV 78.9  78.0 - 100.0 fL   MCH 28.4  26.0 - 34.0 pg   MCHC 35.9  30.0 - 36.0 g/dL   RDW 56.3  87.5 - 64.3 %   Platelets 282  150 - 400 K/uL  BASIC METABOLIC PANEL     Status: Abnormal   Collection Time   04/22/12  2:51 AM      Component Value Range   Sodium 134 (*) 135 - 145 mEq/L   Potassium 3.6  3.5 - 5.1 mEq/L   Chloride 98  96 - 112 mEq/L   CO2 24  19 - 32 mEq/L   Glucose, Bld 122 (*) 70 - 99 mg/dL   BUN 9  6 - 23 mg/dL   Creatinine, Ser 3.29  0.50 - 1.35 mg/dL   Calcium 9.2  8.4 - 51.8 mg/dL   GFR calc non Af Amer 68 (*) >90 mL/min   GFR calc Af Amer 79 (*) >90 mL/min  CARDIAC PANEL(CRET KIN+CKTOT+MB+TROPI)     Status: Abnormal   Collection Time   04/22/12  3:05 AM      Component Value Range   Total CK 3074 (*) 7 - 232 U/L   CK, MB 469.0 (*) 0.3 - 4.0 ng/mL   Troponin I >20.00 (*) <0.30 ng/mL   Relative Index  15.3 (*) 0.0 - 2.5    Intake/Output Summary (Last 24 hours) at 04/22/12 0838 Last data filed at 04/22/12 0700  Gross per 24 hour  Intake  266.8 ml  Output      0 ml  Net  266.8 ml    EKG:  NSR, recent anterior MI with persistent ST elevation.  ASSESSMENT AND PLAN:  Acute anterior MI:  Sheath out this am.  Check echocardiogram.  Up in chair later today  Tobacco abuse:  Educate  HTN:  BP is somewhat labile. I will hold the Norvasc for now.  Rollene Rotunda 04/22/2012 8:38 AM

## 2012-04-22 NOTE — CV Procedure (Signed)
[facility] PERCUTANEOUS CORONARY INTERVENTION REPORT  NAME:  Adam House   MRN: [id number] DOB:  09/19/1950   ADMIT DATE: 04/22/2012  INTERVENTIONAL CARDIOLOGIST: Marykay Lex, M.D., MS PRIMARY CARE PROVIDER: Avon Gully, MD PRIMARY CARDIOLOGIST: Dr. Andee Lineman  PATIENT:  Adam House is a 61 y.o. male with a PHM of Anterior MI in 1998 - reported stent vs. PTCA of LAD at that time, Ischemic CM s/p AICD in 2011, who continues to smoke.  He was in his USOH until ~1PM on 8/7 when he began having substernal chest pressure with shortness of breath. It initially resolved, but recurred off & on until it came on & persisted up to ~8-10/10 by ~10-11PM. He went to [facility] ER (arriving at roughly 2324 hours on 04/21/12) where his ECG was noted to have Anterior-Lateral STE (~57mm in V3-V5 and I, ~41mm in aVL and V6). He has known diffuse Anterior and Inferior Q waves.  Code STEMI was called by the ED MD, and he was emergently transported to [facility] for Emergent cardiac catheterization. He was bolused with IV Heparin and loaded with 600 mg Plavix. No NTG was administered as he has taken Viagra within ~24 hrs of arrival -- his chest pain was rated as 5/10 upon arrival to the Cath Lab at 0056 hours 04/22/12.   PRE-OPERATIVE DIAGNOSIS:  Anterior ST elevation MI; Known Ischemic Cardiomyopathy since 1998 following anterior MI with LAD PCI; status/post AICD in 2011  PROCEDURES PERFORMED:  Left Heart Catheterization via 6 Fr Right Common Femoral Artery access  Native Coronary Angiography  Percutaneous Coronary Artery Intervention on the Mid and Distal LAD 100% occlusion with 2 overlapping Promus Elememt DES 2.47mm x 12 mm (distal) and a 2.75 mm x 28 mm (proximal); final diameter 2.85 mm distal, 2.9 mm at the stent overlap and 3.0 mm in the proximal stent.  PROCEDURE: Consent: The procedure with Risks/Benefits/Alternatives and Indications was reviewed with the patient.   All questions were answered.    Risks / Complications include, but not limited to: Death, MI, CVA/TIA, VF/VT (with defibrillation), Bradycardia (need for temporary pacer placement), contrast induced nephropathy, bleeding / bruising / hematoma / pseudoaneurysm, vascular or coronary injury (with possible emergent CT or Vascular Surgery), adverse medication reactions, infection.    The patient voiced understanding and agree to proceed with Cath Lab RN witnessing.  Risks of procedure as well as the alternatives and risks of each were explained to the (patient/caregiver).  Verbal Consent for procedure obtained.  Unable to obtain written consent because of emergent medical necessity.  PROCEDURE: The patient was brought to the 2nd Floor [facility] in the fasting state and prepped and draped in the usual sterile fashion for Right groin access. Sterile technique was used including antiseptics, cap, gloves, gown, hand hygiene, mask and sheet.  Skin prep: Chlorhexidine;  Time Out: Verified patient identification, verified procedure, site/side was marked, verified correct patient position, special equipment/implants available, medications/allergies/relevent history reviewed, required imaging and test results available.  Performed. The patient was sedated with IV Versed 2mg  & Fentanyl .  Access: Rigth Common Femoral Artery; 6 Fr Sheath. Fluroscopically Guided, Modified Seldinger technique.  On the initial attempt at access, the patient had a severe coughing spell at the point of wire insertion causing the wire to kink, and access was lost.  Manual pressure was held for 4 minutes to ensure hemostasis and to allow the coughing episode to subside.  The artery was then successfully  cannulated using the Modified Seldinger technique.   Diagnostic Angiography:  Catheters were advanced and exchanged over the standard J wire with the sheath aspirated an flushed with each exchange.  The  Pigtail catheter was used following completion of PCI.  Right Coronary Artery Angiography: 6Fr JR4.  Left Coronary Artery Angiogrpaphy: 6Fr XBLAD 3.5  LV Hemodynamics (LV Gram): 6Fr Angled Pigtail.  Hemodynamics:  Central Aortic / Mean Pressures: 140/86 mmHg; 107 mmHg  Left Ventricular Pressures / EDP: 138/32 mmHg; 46 mmHg  Left Ventriculography: Not performed due to elevated LVEDP, frequent catheter related ectopy and known diffuse wall motion abnormalities.  Coronary Anatomy:  Left Main: Large-caliber vessel that trifurcates into the LAD, Ramus Intermedius, Left Circumflex. There is mild distal tapering, but otherwise angiographically normal LAD: Initially begins as a large caliber vessel the very large first septal perforator trunk repaired with a small-caliber first diagonal branch. The arteries in 100% occluded at the next paired Septal trunk and very small second diagonal.  First Diagonal: Small-caliber vessel, diffuse mild luminal irregularities  Following PCI of the initial lesion in the mid LAD a second 70-80% lesion was found following placement of the initial stent. This was considered Lesion #2. Following this there is a more distal 40% lesion in the apical roughly 50% lesion just before the vessel gives off apical diagonal branches and wraps around the LAD covering about one third in to the RPDA territory. The vessel tapers into a relatively small caliber vessel around the apex with diffuse mild luminal irregularities. Left Circumflex: This begins as a moderate to large caliber vessel that bifurcates in the AV groove into 2 small caliber Left Posterior Lateral Branches each bifurcating. Ramus intermedius: Large caliber vessel covering essentially a high OM distribution although was appears him of the LAD it bifurcates distally into a 2 obtuse marginal distributions; minimal luminal irregularites  RCA: The vessel begins a large caliber vessel but has a proximal 40-50% lesion at  the first bend and then continues on as a moderate caliber vessel with several RV marginal branches and is diffusely diseased at 20-30% lesions throughout the mid to distal portion. Then bifurcates into a very small Right Posterior Lateral System and a small-caliber/short Right Posterior Descending Artery both of which are diffusely diseased with mild to moderate luminal irregularities.  Percutaneous Coronary Intervention:  The mid LAD occlusion was identified as the culprit lesion for the Anterior ST Elevation on ECG. Decision was made to proceed with percutaneous intervention.  Please note the PCI was somewhat delayed do to access difficulty resulting from the patient's coughing spell. A weight based bolus of IV Angiomax was administered and the drip was continued until completion of the procedure -- with the plan to continue for 4 hours at the reduced rate of 0.25 mg/Kg per hour.  An ACT of >200 sec was assured prior to advancing the Guidewire.  Guide: 6 Fr   XB LAD 3.5  Guidewire:  BMW  Predilation Balloon:  Trek 2.5 mm x  12 mm -- 8 Atm x  46 Sec -- at the initial occlusion,  0126 hours; Door-to-Balloon time of 30 minutes  8 Atm x  30 Sec - downstream from the initial occlusion,   8 Atm x 30 Sec - mid lesion  Following post inflation angiography, the initially restored TIMI-3 flow was lost resulting in TIMI 0 flow initial occlusion site which was then redilated at 8 Atm x 30 Sec resulting in restoration of TIMI-3 flow distally.  Stent 1:  Promus Element DES 2.75 mm x  28 mm --  12 Atm x  45 Sec After the initial stent was deployed, what appeared to be a 50% lesion just distal to the normal segment where the distal stent was landed now appeared to be a 70-80% lesion that required intervention.  Stent 2: Promus Element DES 2.5 mm x 12 mm -- 14 Atm x 45 Sec,   The balloon was then pulled back to the overlap segment and inflated to 18 Atm x 45 Sec resulting in a diameter of 2.8 mm at the  overlap of stents.   Post-dilation Balloon:  [place] Quantum Apex 3.0 mm x  20 mm;    10 Atm x  60 Sec at the overlap segment going into the distal stent -- final diameter 2.9 mm,   12 Atm x  60 Sec in the proximal stent -- final diameter 3.0 mm  8 Atm x 30 Sec in the distal stent -- final diameter 2.85 mm  Post-deployment cineangiography with and without guidewire in place was performed in multiple orthogonal views demonstrating excellent stent deployment and did not reveal evidence of dissection or perforation.  The patient was transported to the 2300 surgical ICU in a chest pain-free, hemodynamically stable condition.   The patient  was stable before, during and following the procedure.   Patient did tolerate procedure well.  ANESTHESIA:   Local Lidocaine 16 ml SEDATION:  2 mg IV Versed, 50 mcg IV fentanyl ;  Premedication: IV Heparin bolus and drip performed at [facility] continued by EMS, 600 mg oral Plavix at [facility] ER (chosen due to the patient's history of TIA) MEDICATIONS:  Angiomax bolus and drip -- 0.75 mg/kg bolus, 1.75 mg/kg/hour drip reduced to 0.25 mg/kg/hour to continue for post PCI  EBL:   < 20 ml  PATIENT DISPOSITION:    The patient was transferred to the [facility] Surgical ICU in a hemodynamicaly stable, chest pain free condition.  The patient tolerated the procedure well, and there were no complications.  The patient was stable before, during, and after the procedure.  POST-OPERATIVE DIAGNOSIS:    Severe single-vessel disease with 100% mid LAD occlusion followed by a 70% lesion.  Successful to site PCI of the mid and distal LAD with 2 overlapping Promus drug-eluting stents as described resulting in restoration of TIMI-3 flow downstream and resolution of chest pain an ECG changes.  Significantly elevated LVEDP with notable catheter related ectopy.  PLAN OF CARE:  Admit to the ICU under [facility] Cardiology care. Dr. Charm Barges, Cardiology Fellow on call is aware and seen  the patient.  Continue Angiomax for 4 hours post PCI to allow for full effect of of Plavix.  He was given 40 mg IV Lasix in the Cath Lab for his elevated LV EDP.  I have ordered a T. echocardiogram to better assess his cardiac function and wall motion abnormalities as well as left ventricular pressures. I did dissipate he may need oral Lasix and therefore did not start his chlorthalidone.  Continue most other medications, but have switched from Crestor to Atorvastatin for formulary compliance with plan to revert back to Crestor discharge.  Smoking Cessation Counseling recommended and provided and the cath    Marykay Lex, M.D., M.S. [facility] [address] [place], [place]  [address]  [phone]  04/22/2012 3:37 AM

## 2012-04-22 NOTE — Progress Notes (Signed)
CRITICAL VALUE ALERT  Critical value received:  CKMB/Trop 469/>20 Date of notification:  8/8  Time of notification: 0415  Critical value read back:yes  Nurse who received alert:  Sharlet Salina  MD notified (1st page):  Dr Charm Barges  Time of first page: 0545 MD notified (2nd page):n/a  Time of second page:n/a  Responding MD: Theodore Demark PA  Time MD responded:  [phone]
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
## 2012-04-23 DIAGNOSIS — I219 Acute myocardial infarction, unspecified: Secondary | ICD-10-CM

## 2012-04-23 DIAGNOSIS — I519 Heart disease, unspecified: Secondary | ICD-10-CM

## 2012-04-23 LAB — CBC
Platelets: 224 10*3/uL (ref 150–400)
RDW: 15.6 % — ABNORMAL HIGH (ref 11.5–15.5)
WBC: 8.9 10*3/uL (ref 4.0–10.5)

## 2012-04-23 LAB — BASIC METABOLIC PANEL
BUN: 15 mg/dL (ref 6–23)
Chloride: 105 mEq/L (ref 96–112)
GFR calc Af Amer: 62 mL/min — ABNORMAL LOW (ref >90)
GFR calc non Af Amer: 53 mL/min — ABNORMAL LOW (ref >90)
Glucose, Bld: 101 mg/dL — ABNORMAL HIGH (ref 70–99)
Potassium: 3.6 mEq/L (ref 3.5–5.1)
Sodium: 139 mEq/L (ref 135–145)

## 2012-04-23 LAB — POCT I-STAT, CHEM 8
Chloride: 102 mEq/L (ref 96–112)
Creatinine, Ser: 1.1 mg/dL (ref 0.50–1.35)
Glucose, Bld: 121 mg/dL — ABNORMAL HIGH (ref 70–99)
HCT: 44 % (ref 39.0–52.0)
Potassium: 3.4 mEq/L — ABNORMAL LOW (ref 3.5–5.1)
Sodium: 138 mEq/L (ref 135–145)

## 2012-04-23 LAB — CARDIAC PANEL(CRET KIN+CKTOT+MB+TROPI)
CK, MB: 30.3 ng/mL (ref 0.3–4.0)
Total CK: 659 U/L — ABNORMAL HIGH (ref 7–232)
Troponin I: >20 ng/mL (ref <0.30)

## 2012-04-23 MED FILL — Dextrose Inj 5%: INTRAVENOUS | Qty: 50 | Status: AC

## 2012-04-23 NOTE — Progress Notes (Signed)
Cardiology Progress Note Patient Name: Adam House Date of Encounter: 04/23/2012, 7:56 AM     Subjective  Patient complains of 6/10 chest pain this morning, initially resolved with morphine, but now returned. Able to ambulate yesterday without chest pain.    Objective   Telemetry: Sinus rhythm 1st degree AVB, 80s  Medications: . allopurinol  100 mg Oral Daily  . aspirin  325 mg Oral Daily  . atorvastatin  40 mg Oral q1800  . chlorthalidone  25 mg Oral Daily  . clopidogrel  75 mg Oral Q breakfast  . colchicine  0.6 mg Oral Daily  . lisinopril  20 mg Oral Daily  . metoprolol succinate  100 mg Oral Daily  . nicotine  7 mg Transdermal Daily  . pantoprazole  40 mg Oral Q1200  . sodium chloride  3 mL Intravenous Q12H  . spironolactone  25 mg Oral Daily  . DISCONTD: amLODipine  10 mg Oral Daily   . sodium chloride 50 mL/hr at 04/22/12 0700  . DISCONTD: bivalirudin (ANGIOMAX) infusion 5 mg/mL (Cath Lab,ACS,PCI indication) Stopped (04/22/12 0630)    Physical Exam: Temp:  [97.8 F (36.6 C)-99.7 F (37.6 C)] 98.8 F (37.1 C) (08/09 0743) Pulse Rate:  [72-89] 81  (08/09 0700) Resp:  [15-29] 23  (08/09 0700) BP: (80-149)/(44-72) 99/64 mmHg (08/09 0700) SpO2:  [91 %-100 %] 99 % (08/09 0700)  General: Pleasant black male, in no acute distress. Head: Normocephalic, atraumatic, sclera non-icteric, nares are without discharge.  Neck: Supple. No JVD Lungs: Bibasilar rales. No wheezes or rhonchi. Breathing is unlabored. Heart: RRR S1 S2. 2/6 systolic murmur LLSB. No rubs or gallops.  Abdomen: Soft, non-tender, non-distended with normoactive bowel sounds. No rebound/guarding. No obvious abdominal masses. Msk:  Strength and tone appear normal for age. Extremities: Right groin without hematoma. No edema. No clubbing or cyanosis. Distal pedal pulses are intact and equal bilaterally. Neuro: Alert and oriented X 3. Moves all extremities spontaneously. Psych:  Responds to questions  appropriately with a normal affect.   Intake/Output Summary (Last 24 hours) at 04/23/12 0756 Last data filed at 04/23/12 0600  Gross per 24 hour  Intake   2200 ml  Output      0 ml  Net   2200 ml    Labs:  [facility] 04/23/12 0550 04/22/12 0251  NA 139 134*  K 3.6 3.6  CL 105 98  CO2 24 24  GLUCOSE 101* 122*  BUN 15 9  CREATININE 1.39* 1.13  CALCIUM 8.6 9.2   Basename 04/23/12 0550 04/22/12 0251  WBC 8.9 8.5  HGB 13.7 16.5  HCT 39.4 45.9  MCV 79.8 78.9  PLT 224 282   Basename 04/22/12 1344 04/22/12 0848 04/22/12 0305  CKTOTAL 1919* 2702* 3074*  CKMB 248.5* 380.7* 469.0*  TROPONINI >20.00* >20.00* >20.00*   Radiology/Studies:   04/22/12 - 2D Echo Study Conclusions: - Left ventricle: The cavity size was normal. Wall thickness was increased in a pattern of moderate LVH. Systolic function was severely reduced. The estimated ejection fraction was in the range of 25% to 30%. There is akinesis of the apical myocardium. There is akinesis of the mid-distalanteroseptal myocardium. There is akinesis of the mid-distalinferior myocardium. Features are consistent with a pseudonormal left ventricular filling pattern, with concomitant abnormal relaxation and increased filling pressure (grade 2 diastolic dysfunction). - Aortic valve: Trivial regurgitation. - Left atrium: The atrium was moderately dilated.  04/22/12 - Cardiac Cath Hemodynamics:  Central Aortic / Mean  Pressures: 140/86 mmHg; 107 mmHg  Left Ventricular Pressures / EDP: 138/32 mmHg; 46 mmHg Left Ventriculography: Not performed due to elevated LVEDP, frequent catheter related ectopy and known diffuse wall motion abnormalities.  Coronary Anatomy:  Left Main: Large-caliber vessel that trifurcates into the LAD, Ramus Intermedius, Left Circumflex. There is mild distal tapering, but otherwise angiographically normal LAD: Initially begins as a large caliber vessel the very large first septal perforator trunk repaired with a  small-caliber first diagonal branch. The arteries in 100% occluded at the next paired Septal trunk and very small second diagonal.  First Diagonal: Small-caliber vessel, diffuse mild luminal irregularities  Following PCI of the initial lesion in the mid LAD a second 70-80% lesion was found following placement of the initial stent. This was considered Lesion #2. Following this there is a more distal 40% lesion in the apical roughly 50% lesion just before the vessel gives off apical diagonal branches and wraps around the LAD covering about one third in to the RPDA territory. The vessel tapers into a relatively small caliber vessel around the apex with diffuse mild luminal irregularities. Left Circumflex: This begins as a moderate to large caliber vessel that bifurcates in the AV groove into 2 small caliber Left Posterior Lateral Branches each bifurcating.  Ramus intermedius: Large caliber vessel covering essentially a high OM distribution although was appears him of the LAD it bifurcates distally into a 2 obtuse marginal distributions; minimal luminal irregularites  RCA: The vessel begins a large caliber vessel but has a proximal 40-50% lesion at the first bend and then continues on as a moderate caliber vessel with several RV marginal branches and is diffusely diseased at 20-30% lesions throughout the mid to distal portion. Then bifurcates into a very small Right Posterior Lateral System and a small-caliber/short Right Posterior Descending Artery both of which are diffusely diseased with mild to moderate luminal irregularities  POST-OPERATIVE DIAGNOSIS:  Severe single-vessel disease with 100% mid LAD occlusion followed by a 70% lesion.  Successful to site PCI of the mid and distal LAD with 2 overlapping Promus drug-eluting stents as described resulting in restoration of TIMI-3 flow downstream and resolution of chest pain an ECG changes.  Significantly elevated LVEDP with notable catheter related  ectopy.    Assessment and Plan  61 y.o. male w/ PMHx significant for CAD (MI s/p PCI to LAD '98), Chronic Systolic/Diastolic CHF, ICM (s/p ICD '11), HTN, tobacco abuse, CVA, and CKD, Stage 2 who presented to [facility] with complaints of chest pain and subsequently transferred to [facility] on 04/22/12 with Anterolateral ST elevation.  1. Acute Anterior STEMI: s/p 2 overlapping DES to LAD. EKG with persistent ST elevation, slight improvement. Patient reports chest pain this morning initially relieved with Morphine, but reoccurred. Will check cardiac enzymes. Cont ASA (?Change to 81mg ), Plavix, BB, ACEI, and statin.  2. Acute on Chronic systolic/diastolic CHF: EF 16-10% (previously 30-35%) by echo. Elevated LV EDP during cath, received IV lasix. I/Os (+) 2466. Weight not yet recorded this am. Chlorthalidone, Spironolactone, Lisinopril continued after cath. Bibasilar rales on exam, but no overt volume overload or respiratory distress. Further diuresis per MD recs.  3. 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 A couple episodes of 3-4 beats NSVT on tele. Cont BB.   4. Hypertension: SBPs 80s-100s. Norvasc on hold.   5. CKD Stage 2: Crt 1.13 --> 1.39. Cont to monitor  6. Tobacco Abuse: Patient reports desire to quit smoking.   Signed, HOPE, JESSICA PA-C  History  and all data above reviewed.  Chest pain this am as recorded.  No acute change in EKG.   Patient examined.  I agree with the findings as above.  The patient exam reveals COR:RRR, loud rub  ,  Lungs: Bilateral basilar crackles  ,  Abd: Positive bowel sounds, no rebound no guarding, Ext No edema  .  All available labs, radiology testing, previous records reviewed. Agree with documented assessment and plan. I agree with cycling enzymes.  I will hold off on further diuresis given increased creat.  Check BMET in am.  He has a new rub and obvious pericarditis.  I am concerned about this and I will give colchicine (which  he is already on) but avoid steroids and NSAIDs with his recent MI.  I will repeat a limited echo to rule out significant effusion.      Fayrene Fearing [facility]  9:26 AM  04/23/2012

## 2012-04-23 NOTE — Progress Notes (Signed)
  Echocardiogram 2D Echocardiogram (Limited) has been performed.  Adam House 04/23/2012, 4:55 PM

## 2012-04-24 DIAGNOSIS — I213 ST elevation (STEMI) myocardial infarction of unspecified site: Secondary | ICD-10-CM | POA: Diagnosis present

## 2012-04-24 DIAGNOSIS — N179 Acute kidney failure, unspecified: Secondary | ICD-10-CM

## 2012-04-24 DIAGNOSIS — I5023 Acute on chronic systolic (congestive) heart failure: Secondary | ICD-10-CM | POA: Diagnosis present

## 2012-04-24 DIAGNOSIS — N182 Chronic kidney disease, stage 2 (mild): Secondary | ICD-10-CM | POA: Diagnosis present

## 2012-04-24 DIAGNOSIS — Z4502 Encounter for adjustment and management of automatic implantable cardiac defibrillator: Secondary | ICD-10-CM

## 2012-04-24 DIAGNOSIS — I319 Disease of pericardium, unspecified: Secondary | ICD-10-CM | POA: Diagnosis not present

## 2012-04-24 LAB — BASIC METABOLIC PANEL
BUN: 18 mg/dL (ref 6–23)
Chloride: 101 mEq/L (ref 96–112)
Glucose, Bld: 92 mg/dL (ref 70–99)
Potassium: 3.8 mEq/L (ref 3.5–5.1)

## 2012-04-24 NOTE — Progress Notes (Addendum)
Patient ID: Adam House, male   DOB: 1951/06/23, 61 y.o.   MRN: [id number]   SUBJECTIVE:  The patient is feeling better. He was felt that he has pericarditis as an additional issue. He was started on colchicine. He did not have any further pain later in the day yesterday her during the night. The I/O recorded so far the computer is not complete. He had 900cc in and 3 L out.  He had 2D echo to be sure that he did not have a significant effusion. It was felt that his EF was 25-30%. There is moderate right ventricular dysfunction. There is no significant pericardial effusion.  This morning I have spent greater than 30 minutes reassessing all this data in making decisions. Filed Vitals:   04/24/12 0600 04/24/12 0700 04/24/12 0800 04/24/12 0802  BP: 98/58 94/41 101/65   Pulse: 77 79 75   Temp:    97.7 F (36.5 C)  TempSrc:    Oral  Resp: 26 22 22    Height:      Weight:      SpO2: 100% 98% 99%     Intake/Output Summary (Last 24 hours) at 04/24/12 1016 Last data filed at 04/24/12 0800  Gross per 24 hour  Intake    720 ml  Output      0 ml  Net    720 ml    LABS: Basic Metabolic Panel:  Basename 04/24/12 0415 04/23/12 0550  NA 138 139  K 3.8 3.6  CL 101 105  CO2 26 24  GLUCOSE 92 101*  BUN 18 15  CREATININE 1.40* 1.39*  CALCIUM 9.2 8.6  MG -- --  PHOS -- --   Liver Function Tests: No results found for this basename: AST:2,ALT:2,ALKPHOS:2,BILITOT:2,PROT:2,ALBUMIN:2 in the last 72 hours No results found for this basename: LIPASE:2,AMYLASE:2 in the last 72 hours CBC:  Basename 04/23/12 0550 04/22/12 0251  WBC 8.9 8.5  NEUTROABS -- --  HGB 13.7 16.5  HCT 39.4 45.9  MCV 79.8 78.9  PLT 224 282   Cardiac Enzymes:  Basename 04/23/12 1708 04/23/12 0831 04/22/12 1344  CKTOTAL 454* 659* 1919*  CKMB 16.1* 30.3* 248.5*  CKMBINDEX -- -- --  TROPONINI 10.13* >20.00* >20.00*   BNP: No components found with this basename: POCBNP:3 D-Dimer: No results found for this  basename: DDIMER:2 in the last 72 hours Hemoglobin A1C: No results found for this basename: HGBA1C in the last 72 hours Fasting Lipid Panel: No results found for this basename: CHOL,HDL,LDLCALC,TRIG,CHOLHDL,LDLDIRECT in the last 72 hours Thyroid Function Tests: No results found for this basename: TSH,T4TOTAL,FREET3,T3FREE,THYROIDAB in the last 72 hours  RADIOLOGY: No results found.  PHYSICAL EXAM  patient is oriented to person time and place. Affect is normal. There is no jugular venous distention. Lungs reveal bilateral basilar rales. Cardiac exam reveals S1-S2. I do not hear significant rub at this time. The abdomen is soft. There is no significant peripheral edema.   TELEMETRY:    I have personally reviewed telemetry. There is normal sinus rhythm.  ASSESSMENT AND PLAN:   CARDIOMYOPATHY, ISCHEMIC      Hypertension    Blood pressure is on the low side. Medications are being adjusted carefully.   STEMI (ST elevation myocardial infarction)    Patient continues to recover from his acute infarct. Enzymes drawn yesterday showed continuing decrease in his troponins and MB. This is post MI. This suggest that his recurring pain did not represent further ischemia.   Acute on chronic systolic CHF (congestive  heart failure)    The patient did diurese yesterday. He still has rales. We will need to diurese further. His creatinine is 1.4 today. I am hopeful that with careful gentle diuresis this will in fact improve. The patient did not receive Lasix yesterday. He diuresis on chlorthalidone and spironolactone. I will leave these to medicines in place.   ICD (implantable cardiac defibrillator) battery depletion      Chronic kidney disease (CKD), stage II (mild)    Creatinine is 1.4. This is higher than his baseline. We'll follow this carefully.   Acute renal failure    Creatinine did go up from 1.1-1.4. I am hopeful that this will improve with careful diuresis.   Pericarditis    The patient  is receiving colchicine. He feels better. Echo yesterday showed no significant effusion. We will continue colchicine.   Willa Rough 04/24/2012 10:16 AM

## 2012-04-25 LAB — BASIC METABOLIC PANEL
BUN: 23 mg/dL (ref 6–23)
CO2: 23 mEq/L (ref 19–32)
Calcium: 9.5 mg/dL (ref 8.4–10.5)
Chloride: 98 mEq/L (ref 96–112)
Creatinine, Ser: 1.42 mg/dL — ABNORMAL HIGH (ref 0.50–1.35)
GFR calc Af Amer: 60 mL/min — ABNORMAL LOW (ref >90)
GFR calc non Af Amer: 52 mL/min — ABNORMAL LOW (ref >90)
Glucose, Bld: 96 mg/dL (ref 70–99)
Potassium: 3.8 mEq/L (ref 3.5–5.1)
Sodium: 134 mEq/L — ABNORMAL LOW (ref 135–145)

## 2012-04-25 MED ORDER — FUROSEMIDE 10 MG/ML IJ SOLN
40.0000 mg | Freq: Once | INTRAMUSCULAR | Status: AC
  Administered 2012-04-25: 40 mg via INTRAVENOUS
  Filled 2012-04-25: qty 4

## 2012-04-25 MED ORDER — POTASSIUM CHLORIDE CRYS ER 20 MEQ PO TBCR
40.0000 meq | EXTENDED_RELEASE_TABLET | Freq: Once | ORAL | Status: AC
  Administered 2012-04-25: 40 meq via ORAL
  Filled 2012-04-25: qty 2

## 2012-04-25 NOTE — Progress Notes (Signed)
Patient ID: Adam House, male   DOB: 06-21-1951, 61 y.o.   MRN: [id number]   SUBJECTIVE: Patient is feeling better. His input and output were equal yesterday. He still has rales. He does not feel short of breath.   Filed Vitals:   04/25/12 0626 04/25/12 0700 04/25/12 0745 04/25/12 1040  BP: 98/61   96/65  Pulse:    90  Temp:   97.8 F (36.6 C)   TempSrc:   Oral   Resp: 19     Height:      Weight:  199 lb 4.7 oz (90.4 kg)    SpO2: 98%  98%     Intake/Output Summary (Last 24 hours) at 04/25/12 1055 Last data filed at 04/25/12 0400  Gross per 24 hour  Intake   1179 ml  Output    950 ml  Net    229 ml    LABS: Basic Metabolic Panel:  Basename 04/25/12 0521 04/24/12 0415  NA 134* 138  K 3.8 3.8  CL 98 101  CO2 23 26  GLUCOSE 96 92  BUN 23 18  CREATININE 1.42* 1.40*  CALCIUM 9.5 9.2  MG -- --  PHOS -- --   Liver Function Tests: No results found for this basename: AST:2,ALT:2,ALKPHOS:2,BILITOT:2,PROT:2,ALBUMIN:2 in the last 72 hours No results found for this basename: LIPASE:2,AMYLASE:2 in the last 72 hours CBC:  Basename 04/23/12 0550  WBC 8.9  NEUTROABS --  HGB 13.7  HCT 39.4  MCV 79.8  PLT 224   Cardiac Enzymes:  Basename 04/23/12 1708 04/23/12 0831 04/22/12 1344  CKTOTAL 454* 659* 1919*  CKMB 16.1* 30.3* 248.5*  CKMBINDEX -- -- --  TROPONINI 10.13* >20.00* >20.00*   BNP: No components found with this basename: POCBNP:3 D-Dimer: No results found for this basename: DDIMER:2 in the last 72 hours Hemoglobin A1C: No results found for this basename: HGBA1C in the last 72 hours Fasting Lipid Panel: No results found for this basename: CHOL,HDL,LDLCALC,TRIG,CHOLHDL,LDLDIRECT in the last 72 hours Thyroid Function Tests: No results found for this basename: TSH,T4TOTAL,FREET3,T3FREE,THYROIDAB in the last 72 hours  RADIOLOGY: No results found.  PHYSICAL EXAM  Patient is oriented to person time and place. Affect is normal. There is no jugulovenous  distention. Lungs reveal bilateral basilar rales. Cardiac exam reveals S1 and S2. There no clicks or significant murmurs. The abdomen is soft. There is no significant peripheral edema.   TELEMETRY: I have reviewed telemetry today April 25, 2012. There is sinus rhythm. There has not been any significant ectopy.   ASSESSMENT AND PLAN:    CARDIOMYOPATHY, ISCHEMIC      Hypertension    Blood pressure remains on the relatively low side. I will not be changing his medications.   STEMI (ST elevation myocardial infarction)      Patient continues to improve post MI. Continue therapy.   Acute on chronic systolic CHF (congestive heart failure)   There is no significant diuresis yesterday. I have not given him Lasix as he was on some other medications in addition. I will give Lasix today.   ICD (implantable cardiac defibrillator) battery depletion      Chronic kidney disease (CKD), stage II (mild)    Creatinine remains at 1.4.. This is higher than his baseline. We'll follow this carefully.   Acute renal failure    Creatinine did go up from 1.1-1.4. I am hopeful that this will improve with careful diuresis.   Pericarditis    The patient is receiving colchicine. He feels better. Echo  showed no significant effusion. We will continue colchicine.  The patient is ready to be transferred to telemetry.   Willa Rough 04/25/2012 10:55 AM

## 2012-04-25 NOTE — Progress Notes (Signed)
Pt ambulated around the unit HR 94 B/P 98/61 RR18. Pt tolerated activity well. Pt denies CP, or SOB.

## 2012-04-25 NOTE — Progress Notes (Signed)
Report given to [facility] on 2000, to transfer to 2004 via wheelchair, Berle Mull RN

## 2012-04-25 NOTE — Progress Notes (Signed)
Patient ambulated independently 200 ft with a steady gait and steady pace. Patient tolerated ambulation well. Patient returned to room and sitting in chair. Will continue to monitor.

## 2012-04-26 ENCOUNTER — Inpatient Hospital Stay (HOSPITAL_COMMUNITY): Payer: PRIVATE HEALTH INSURANCE

## 2012-04-26 ENCOUNTER — Encounter (HOSPITAL_COMMUNITY): Payer: Self-pay | Admitting: Family Medicine

## 2012-04-26 DIAGNOSIS — R220 Localized swelling, mass and lump, head: Secondary | ICD-10-CM | POA: Diagnosis not present

## 2012-04-26 LAB — BASIC METABOLIC PANEL
CO2: 23 mEq/L (ref 19–32)
Calcium: 9.3 mg/dL (ref 8.4–10.5)
Chloride: 96 mEq/L (ref 96–112)
Glucose, Bld: 115 mg/dL — ABNORMAL HIGH (ref 70–99)
Sodium: 132 mEq/L — ABNORMAL LOW (ref 135–145)

## 2012-04-26 MED ORDER — DIPHENHYDRAMINE HCL 50 MG PO CAPS
50.0000 mg | ORAL_CAPSULE | Freq: Once | ORAL | Status: AC
  Administered 2012-04-26: 50 mg via ORAL
  Filled 2012-04-26: qty 1

## 2012-04-26 MED ORDER — DIPHENHYDRAMINE HCL 50 MG/ML IJ SOLN
50.0000 mg | Freq: Once | INTRAMUSCULAR | Status: AC
  Administered 2012-04-26: 50 mg via INTRAVENOUS
  Filled 2012-04-26: qty 1

## 2012-04-26 NOTE — Progress Notes (Signed)
UR Completed.  Adam House [name] [phone] 04/26/2012  

## 2012-04-26 NOTE — Progress Notes (Signed)
Patient ID: Adam House, male   DOB: 03-12-1951, 61 y.o.   MRN: [id number]   SUBJECTIVE: Patient feels great today. I have increased his diuretics yesterday. He did not have any significant diuresis. In addition his renal function is slightly worse. He is not short of breath. He continues to have fine basilar crackles. This may not be heart failure.  In addition he is just noticed some swelling of his lower lip this morning. There is no injury. This is probably perioral edema.   Filed Vitals:   04/25/12 1300 04/25/12 1449 04/25/12 1942 04/26/12 0500  BP: 101/63 98/58 108/68 99/64  Pulse:  75 61 77  Temp:  97.8 F (36.6 C) 96.4 F (35.8 C) 97.8 F (36.6 C)  TempSrc:   Oral Oral  Resp: 25 18 20 20   Height:      Weight:    197 lb 12.8 oz (89.721 kg)  SpO2:  97% 95% 100%    Intake/Output Summary (Last 24 hours) at 04/26/12 0830 Last data filed at 04/25/12 1700  Gross per 24 hour  Intake    730 ml  Output    925 ml  Net   -195 ml    LABS: Basic Metabolic Panel:  Basename 04/26/12 0630 04/25/12 0521  NA 132* 134*  K 3.7 3.8  CL 96 98  CO2 23 23  GLUCOSE 115* 96  BUN 32* 23  CREATININE 1.73* 1.42*  CALCIUM 9.3 9.5  MG -- --  PHOS -- --   Liver Function Tests: No results found for this basename: AST:2,ALT:2,ALKPHOS:2,BILITOT:2,PROT:2,ALBUMIN:2 in the last 72 hours No results found for this basename: LIPASE:2,AMYLASE:2 in the last 72 hours CBC: No results found for this basename: WBC:2,NEUTROABS:2,HGB:2,HCT:2,MCV:2,PLT:2 in the last 72 hours Cardiac Enzymes:  Basename 04/23/12 1708 04/23/12 0831  CKTOTAL 454* 659*  CKMB 16.1* 30.3*  CKMBINDEX -- --  TROPONINI 10.13* >20.00*   BNP: No components found with this basename: POCBNP:3 D-Dimer: No results found for this basename: DDIMER:2 in the last 72 hours Hemoglobin A1C: No results found for this basename: HGBA1C in the last 72 hours Fasting Lipid Panel: No results found for this basename:  CHOL,HDL,LDLCALC,TRIG,CHOLHDL,LDLDIRECT in the last 72 hours Thyroid Function Tests: No results found for this basename: TSH,T4TOTAL,FREET3,T3FREE,THYROIDAB in the last 72 hours  RADIOLOGY: No results found.  PHYSICAL EXAM Patient is oriented to person time and place. Affect is normal. There is no jugulovenous distention. There are fine crackles at both bases. Cardiac exam reveals S1 and S2. There no clicks or significant murmurs. The abdomen is soft. There is no peripheral edema.   TELEMETRY: I have reviewed telemetry today April 26, 2012. There is normal sinus rhythm.   ASSESSMENT AND PLAN:   CARDIOMYOPATHY, ISCHEMIC    The patient has lip swelling this morning. His ACE inhibitor must be stopped. He did not receive at this morning.   Hypertension    Patient continues to have blood pressure on the low side. His ACE inhibitors being stopped today for his lip swelling.   STEMI (ST elevation myocardial infarction)    No recurrent chest pain.   Acute on chronic systolic CHF (congestive heart failure)     The patient seemed to have congestive heart failure after his MI. He continues to have fine crackles at the bases. However, with attempts to diuresis him his renal function is worse. His creatinine is now up to 1.7. I had added Lasix to his other oral medications yesterday. Today I will stop his chlorthalidone,  spironolactone,. Also I will not give any Lasix. Chemistry will be checked tomorrow. Chest x-ray will be done to get a further idea of the basis of his fine crackles at his bases.   ICD (implantable cardiac defibrillator) battery depletion   Chronic kidney disease (CKD), stage II (mild)    Renal function is worse today. As noted above his diuretics will be stopped. Also ACE inhibitor is being held for his lip swelling.   Acute renal failure     Plans are outlined above.   Pericarditis    Clinically he has pericarditis that is now stable on colchicine. Continue  colchicine.  Lip swelling    Lip swelling is new in concerning. It is a localized finding at this point. He has no difficulty breathing. He will not receive an ACE inhibitor this morning. He is being given Benadryl 50 mg orally at this time. We'll follow him carefully over the next few hours.  Willa Rough 04/26/2012 8:30 AM

## 2012-04-26 NOTE — Progress Notes (Signed)
CARDIAC REHAB PHASE I   PRE:  Rate/Rhythm: 80SR  BP:  Supine:   Sitting: 98/60  Standing:    SaO2: 96%RA  MODE:  Ambulation: 550 ft   POST:  Rate/Rhythem: 85  BP:  Supine:   Sitting: 102/60  Standing:    SaO2: 99%RA [id number] Pt walked 550 ft independently without SOB or CP. Tolerated well. Education completed. Gave pt CHF booklet and discussed zones and importance of daily weights and watching sodium. Discussed smoking cessation and gave handouts. Pt states he is ready to stop smoking. Discussed CRP 2 and permission given to refer to [facility] Phase 2.  Adam House

## 2012-04-26 NOTE — Progress Notes (Signed)
Patient ambulated independently 200 ft with a steady gait and steady pace. Patient tolerated ambulation well. Patient returned to bed. Will continue to monitor.

## 2012-04-27 LAB — BASIC METABOLIC PANEL
BUN: 32 mg/dL — ABNORMAL HIGH (ref 6–23)
CO2: 23 mEq/L (ref 19–32)
GFR calc non Af Amer: 46 mL/min — ABNORMAL LOW (ref >90)
Glucose, Bld: 90 mg/dL (ref 70–99)
Potassium: 4.5 mEq/L (ref 3.5–5.1)
Sodium: 135 mEq/L (ref 135–145)

## 2012-04-27 MED ORDER — FUROSEMIDE 20 MG PO TABS: 20.0000 mg | ORAL_TABLET | Freq: Two times a day (BID) | ORAL | Status: DC

## 2012-04-27 MED ORDER — NICOTINE 7 MG/24HR TD PT24: 1.0000 | MEDICATED_PATCH | Freq: Every day | TRANSDERMAL | Status: DC

## 2012-04-27 MED ORDER — COLCHICINE 0.6 MG PO TABS: 0.6000 mg | ORAL_TABLET | Freq: Every day | ORAL | Status: DC | PRN

## 2012-04-27 MED ORDER — FUROSEMIDE 20 MG PO TABS: 20.0000 mg | ORAL_TABLET | Freq: Every day | ORAL | Status: DC

## 2012-04-27 MED ORDER — CLOPIDOGREL BISULFATE 75 MG PO TABS: 75.0000 mg | ORAL_TABLET | Freq: Every day | ORAL | Status: DC

## 2012-04-27 MED ORDER — NICOTINE 7 MG/24HR TD PT24: 1.0000 | MEDICATED_PATCH | Freq: Every day | TRANSDERMAL | Status: AC

## 2012-04-27 NOTE — Discharge Summary (Signed)
CARDIOLOGY DISCHARGE SUMMARY   Patient ID: [name] MRN: [id number] DOB/AGE: 1951-08-27 61 y.o.  Admit date: 04/22/2012 Discharge date: 04/27/2012  Primary Discharge Diagnosis:  STEMI - s/p 2 overlapping Promus Elememt DES 2.68mm x 12 mm (distal LAD) and a 2.75 mm x 28 mm (proximal LAD); final diameter 2.85 mm distal, 2.9 mm at the stent overlap and 3.0 mm in the proximal stent; Post-MI Pericarditis  Secondary Discharge Diagnosis:    Diagnosis Date    Lip swelling - felt secondary to lisinopril      Acute on chronic systolic CHF (congestive heart failure) - improved at discharge      Hypertension   . CAD (coronary artery disease)     s/p PCI after MI 1998  . Congestive heart failure, unspecified     NYHA Class II/III  . Ischemic cardiomyopathy     CM (EF 30-35%); s/p AICD 2011  . CRI (chronic renal insufficiency)   . Unspecified essential hypertension   . Impotence of organic origin   . 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 Esophageal reflux   . 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 Tobacco use disorder   . Automatic implantable cardiac defibrillator in situ    Procedures: 2D Echocardiogram, Left Heart Catheterization via 6 Fr Right Common Femoral Artery access, Native Coronary Angiography,Percutaneous Coronary Artery Intervention on the Mid and Distal LAD 100% occlusion with 2 overlapping Promus Elememt DES 2.68mm x 12 mm (distal) and a 2.75 mm x 28 mm (proximal); final diameter 2.85 mm distal, 2.9 mm at the stent overlap and 3.0 mm in the proximal stent.  Hospital Course: Mr. Robitaille is a 61 year old male with a history of coronary artery disease. He went to [facility] and he and with chest pain and shortness of breath. His ECG was consistent with an anterolateral ST elevation MI. He was transferred emergently to [facility] and taken directly to the  cath lab.  The full cardiac catheterization results are listed below. He had drug-eluting stents to his LAD and his symptoms improved. He was admitted to the CCU. The cath lab, he was given IV Lasix because of elevated left ventricular end-diastolic pressure. He also had some pulmonary edema on his chest x-ray. He required some additional Lasix during his hospital stay, so his chlorthalidone was discontinued and he will be on Lasix at discharge. Initially, he was on Aldactone as well but his blood pressure would not tolerate both. The Aldactone is discontinued at this time. Consideration can be given to resuming it as an outpatient. He lost a total of 15 pounds during his hospital stay. His weight at discharge was 196 pounds. By discharge, his O2 saturation was 97% or greater on room air.  He was counseled on smoking cessation and seen by cardiac rehabilitation. His cardiac enzymes were cycled and his initial CK-MB was 469. It trended down after that. A 2-D echocardiogram showed an EF of 25-30% with wall motion abnormalities and grade 2 diastolic dysfunction. His Norvasc was held because his blood pressure was low at times. He had been on a beta blocker prior to admission and this was continued. He was on a statin as well. His ACE inhibitor was initially continued but he developed perioral edema and the lisinopril was discontinued permanently. The edema responded to Benadryl and did not return.  On 04/23/2012, he developed chest pain. He  had a loud pericardial rub on exam. A repeat limited echocardiogram did not show a pericardial effusion. He was already on allopurinol for gout and was started on colchicine daily. His chest pain improved. His cardiac enzymes continued to trend down and his ECG did not show anterolateral ST elevation so repeat cath was not indicated. He was felt to have post MI pericarditis. 04/25/2012, he was stable enough for transfer to telemetry. He continued to improve and was gradually  able to increase his activity. His chest pain remained controlled by the colchicine.  04/27/2012, Mr. Rembold is much improved. He was evaluated by Dr. Eden Emms and considered stable for discharge, to follow up in [place].  Labs:  Lab Results  Component Value Date   WBC 8.9 04/23/2012   HGB 13.7 04/23/2012   HCT 39.4 04/23/2012   MCV 79.8 04/23/2012   PLT 224 04/23/2012    Lab 04/27/12 0630  NA 135  K 4.5  CL 99  CO2 23  BUN 32*  CREATININE 1.57*  CALCIUM 9.8  PROT --  BILITOT --  ALKPHOS --  ALT --  AST --  GLUCOSE 90   Lab Results  Component Value Date   CKTOTAL 454* 04/23/2012   CKMB 16.1* 04/23/2012   TROPONINI 10.13* 04/23/2012    Chest 2 View 04/26/2012  *RADIOLOGY REPORT*  Clinical Data: Recent myocardial infarction.  Continued abnormal breath sounds.  CHEST - 2 VIEW  Comparison: 04/21/2012.  Findings: Trachea is midline.  Heart size normal.  Pacemaker lead tip projects over the right atrium and AICD lead tip projects over the right ventricle.  There has been interval improvement in bibasilar dependent air space disease, without complete resolution. Question mild coarsening of the lung bases.  No pleural fluid.  IMPRESSION: Improving pulmonary edema.  Question underlying fibrotic changes at the lung bases.  Original Report Authenticated By: Reyes Ivan, M.D.   Cardiac Cath: 04/22/2012 Coronary Anatomy:  Left Main: Large-caliber vessel that trifurcates into the LAD, Ramus Intermedius, Left Circumflex. There is mild distal tapering, but otherwise angiographically normal LAD: Initially begins as a large caliber vessel the very large first septal perforator trunk repaired with a small-caliber first diagonal branch. The arteries in 100% occluded at the next paired Septal trunk and very small second diagonal.  First Diagonal: Small-caliber vessel, diffuse mild luminal irregularities  Following PCI of the initial lesion in the mid LAD a second 70-80% lesion was found following placement of  the initial stent. This was considered Lesion #2. Following this there is a more distal 40% lesion in the apical roughly 50% lesion just before the vessel gives off apical diagonal branches and wraps around the LAD covering about one third in to the RPDA territory. The vessel tapers into a relatively small caliber vessel around the apex with diffuse mild luminal irregularities. Left Circumflex: This begins as a moderate to large caliber vessel that bifurcates in the AV groove into 2 small caliber Left Posterior Lateral Branches each bifurcating.  Ramus intermedius: Large caliber vessel covering essentially a high OM distribution although was appears him of the LAD it bifurcates distally into a 2 obtuse marginal distributions; minimal luminal irregularites  RCA: The vessel begins a large caliber vessel but has a proximal 40-50% lesion at the first bend and then continues on as a moderate caliber vessel with several RV marginal branches and is diffusely diseased at 20-30% lesions throughout the mid to distal portion. Then bifurcates into a very small Right Posterior Lateral System and  a small-caliber/short Right Posterior Descending Artery both of which are diffusely diseased with mild to moderate luminal irregularities. Stent 1: Promus Element DES 2.75 mm x 28 mm -- 12 Atm x 45 Sec After the initial stent was deployed, what appeared to be a 50% lesion just distal to the normal segment where the distal stent was landed now appeared to be a 70-80% lesion that required intervention.  Stent 2: Promus Element DES 2.5 mm x 12 mm -- 14 Atm x 45 Sec,  The balloon was then pulled back to the overlap segment and inflated to 18 Atm x 45 Sec resulting in a diameter of 2.8 mm at the overlap of stents.    EKG: 23-Apr-2012 08:10:41 Sinus rhythm with 1st degree A-V block Inferior infarct , age undetermined Anterolateral infarct , age undetermined No significant change since last tracing Vent. rate 82 BPM PR interval  232 ms QRS duration 94 ms QT/QTc 378/441 ms P-R-T axes 32 175 126  Echo: 04/22/2012 Study Conclusions - Left ventricle: The cavity size was normal. Wall thickness was increased in a pattern of moderate LVH. Systolic function was severely reduced. The estimated ejection fraction was in the range of 25% to 30%. There is akinesis of the apical myocardium. There is akinesis of the mid-distalanteroseptal myocardium. There is akinesis of the mid-distalinferior myocardium. Features are consistent with a pseudonormal left ventricular filling pattern, with concomitant abnormal relaxation and increased filling pressure (grade 2 diastolic dysfunction). - Aortic valve: Trivial regurgitation. - Left atrium: The atrium was moderately dilated.   04/23/2012 Study Conclusions - Left ventricle: The cavity size was mildly dilated. Wall thickness was normal. Systolic function was severely reduced. The estimated ejection fraction was in the range of 25% to 30%. Diffuse hypokinesis. There is akinesis of the apical myocardium. - Left atrium: The atrium was mildly dilated. - Right ventricle: Systolic function was moderately reduced. Impressions: - Limited study to R/O pericardial effusion; full doppler study not performed.   FOLLOW UP PLANS AND APPOINTMENTS Allergies  Allergen Reactions  . Lisinopril Swelling    angioedema  . Naproxen Hives   Medication List  As of 04/27/2012  9:38 AM   STOP taking these medications         amLODipine 10 MG tablet      lisinopril 20 MG tablet      NON FORMULARY      predniSONE 10 MG tablet      spironolactone 25 MG tablet         TAKE these medications         albuterol 108 (90 BASE) MCG/ACT inhaler   Commonly known as: PROVENTIL HFA;VENTOLIN HFA   Inhale 2 puffs into the lungs every 6 (six) hours as needed. For shortness of breath      allopurinol 100 MG tablet   Commonly known as: ZYLOPRIM   Take 100 mg by mouth daily.      aspirin 325 MG  tablet   Take 325 mg by mouth daily.      chlorthalidone 25 MG tablet   Commonly known as: HYGROTON   Take 25 mg by mouth daily.      colchicine 0.6 MG tablet   Take 1 tablet (0.6 mg total) by mouth daily as needed. For gout      Fish Oil 1200 MG Caps   Take 1 capsule by mouth daily.      furosemide 20 MG tablet   Commonly known as: LASIX   Take 1 tablet (20  mg total) by mouth 2 (two) times daily.      metoprolol succinate 100 MG 24 hr tablet   Commonly known as: TOPROL-XL   Take 1 tablet (100 mg total) by mouth daily.      nicotine 7 mg/24hr patch   Commonly known as: NICODERM CQ - dosed in mg/24 hr   Place 1 patch onto the skin daily. OR use lozenges      OVER THE COUNTER MEDICATION   Take 1 lozenge by mouth every 4 (four) hours as needed. To curb smoking cravings - ONLY if not using patch      pantoprazole 40 MG tablet   Commonly known as: PROTONIX   Take 1 tablet (40 mg total) by mouth daily.      rosuvastatin 10 MG tablet   Commonly known as: CRESTOR   Take 10 mg by mouth daily.      sildenafil 50 MG tablet   Commonly known as: VIAGRA   Take 25 mg by mouth daily as needed. For erectile dysfunction           Discharge Orders    Future Appointments: Provider: Department: Dept Phone: Center:   05/13/2012 1:40 PM Prescott Parma, PA [facility] [phone] [email or web address]   06/17/2012 8:20 AM [facility] [phone] [email or web address]     Future Orders Please Complete By Expires   Amb Referral to Cardiac Rehabilitation      Comments:   Referring to [facility] Phase 2      BRING ALL MEDICATIONS WITH YOU TO FOLLOW UP APPOINTMENTS  Time spent with patient to include physician time: 46 min Signed: Theodore Demark 04/27/2012, 9:01 AM Co-Sign MD

## 2012-04-27 NOTE — Progress Notes (Signed)
Discharge orders received. Pt stable and education completed. Pt to be discharged home with home health orders.

## 2012-04-27 NOTE — Progress Notes (Signed)
  Patient ID: Adam House, male   DOB: 1951-06-06, 61 y.o.   MRN: [id number]   SUBJECTIVE: no chest pain dyspnea or palpitations  Walked this am No lip swelling ACE held   Filed Vitals:   04/26/12 0500 04/26/12 1327 04/26/12 2107 04/27/12 0425  BP: 99/64 103/62 110/74 96/65  Pulse: 77 74 80 78  Temp: 97.8 F (36.6 C) 97.9 F (36.6 C) 98.1 F (36.7 C) 97.8 F (36.6 C)  TempSrc: Oral Oral Oral Oral  Resp: 20 20 20 20   Height:      Weight: 197 lb 12.8 oz (89.721 kg)   196 lb 3.4 oz (89 kg)  SpO2: 100% 97% 99% 100%   No intake or output data in the 24 hours ending 04/27/12 0818  LABS: Basic Metabolic Panel:  Basename 04/27/12 0630 04/26/12 0630  NA 135 132*  K 4.5 3.7  CL 99 96  CO2 23 23  GLUCOSE 90 115*  BUN 32* 32*  CREATININE 1.57* 1.73*  CALCIUM 9.8 9.3  MG -- --  PHOS -- --    RADIOLOGY: No results found.  PHYSICAL EXAM Patient is oriented to person time and place. Affect is normal. There is no jugulovenous distention. There are fine crackles at both bases. Cardiac exam reveals S1 and S2. There no clicks or significant murmurs. The abdomen is soft. There is no peripheral edema.   TELEMETRY: I have reviewed telemetry today April 27, 2012. There is normal sinus rhythm. No VT   ASSESSMENT AND PLAN:   CARDIOMYOPATHY, ISCHEMIC S/P anterior MI no ACE due to angioedema.  Low dose beta blocker   Hypertension Stable on low side post MI   STEMI (ST elevation myocardial infarction)    No recurrent chest pain. DES x2 to mid LAD   Acute on chronic systolic CHF (congestive heart failure)    Stable lungs clear and Cr stable   ICD (implantable cardiac defibrillator) battery depletion   Chronic kidney disease (CKD), stage II (mild) STable ACE held     Pericarditis    Clinically he has pericarditis no rub on exam that is now stable on colchicine. Continue colchicine.  D/C home with Toprol Lasix 20mg  Statin Colchicine Rechallenge with ARB as  outpatient D/C calcium blocker No aldactone F/U [facility] in [place]??  Adam House 04/27/2012 8:18 AM

## 2012-05-03 ENCOUNTER — Other Ambulatory Visit: Payer: Self-pay | Admitting: *Deleted

## 2012-05-03 DIAGNOSIS — I2510 Atherosclerotic heart disease of native coronary artery without angina pectoris: Secondary | ICD-10-CM

## 2012-05-03 DIAGNOSIS — I10 Essential (primary) hypertension: Secondary | ICD-10-CM

## 2012-05-03 DIAGNOSIS — I2589 Other forms of chronic ischemic heart disease: Secondary | ICD-10-CM

## 2012-05-03 DIAGNOSIS — F17200 Nicotine dependence, unspecified, uncomplicated: Secondary | ICD-10-CM

## 2012-05-03 MED ORDER — PANTOPRAZOLE SODIUM 40 MG PO TBEC: 40.0000 mg | DELAYED_RELEASE_TABLET | Freq: Every day | ORAL | Status: DC

## 2012-05-13 ENCOUNTER — Encounter: Payer: Self-pay | Admitting: Physician Assistant

## 2012-05-13 ENCOUNTER — Ambulatory Visit (INDEPENDENT_AMBULATORY_CARE_PROVIDER_SITE_OTHER): Payer: PRIVATE HEALTH INSURANCE | Admitting: Physician Assistant

## 2012-05-13 VITALS — BP 131/85 | HR 104 | Ht 69.5 in | Wt 206.0 lb

## 2012-05-13 DIAGNOSIS — Z79899 Other long term (current) drug therapy: Secondary | ICD-10-CM

## 2012-05-13 DIAGNOSIS — I2510 Atherosclerotic heart disease of native coronary artery without angina pectoris: Secondary | ICD-10-CM

## 2012-05-13 DIAGNOSIS — N182 Chronic kidney disease, stage 2 (mild): Secondary | ICD-10-CM

## 2012-05-13 DIAGNOSIS — I10 Essential (primary) hypertension: Secondary | ICD-10-CM

## 2012-05-13 DIAGNOSIS — Z95810 Presence of automatic (implantable) cardiac defibrillator: Secondary | ICD-10-CM

## 2012-05-13 DIAGNOSIS — I2589 Other forms of chronic ischemic heart disease: Secondary | ICD-10-CM

## 2012-05-13 DIAGNOSIS — E785 Hyperlipidemia, unspecified: Secondary | ICD-10-CM | POA: Insufficient documentation

## 2012-05-13 MED ORDER — NITROGLYCERIN 0.4 MG SL SUBL: 0.4000 mg | SUBLINGUAL_TABLET | SUBLINGUAL | Status: DC | PRN

## 2012-05-13 MED ORDER — ASPIRIN EC 81 MG PO TBEC: 81.0000 mg | DELAYED_RELEASE_TABLET | Freq: Every day | ORAL | Status: AC

## 2012-05-13 NOTE — Assessment & Plan Note (Signed)
Patient has been doing extremely well, following recent acute STEMI. He is ambulating on his own, with no associated symptoms. He also denies any symptoms suggestive of decompensated CHF. He is compliant with his medications, refrains from added salt, and weighs himself daily. Regarding current medication regimen, I will decide as to whether or not to resume of the ACE inhibitor and Aldactone, pending review of a followup metabolic profile from today. I will also schedule him for an RN visit here in one week for reassessment of  vital signs. If he remains tachycardic, then I will increase his current beta blocker dose. Patient is to remain on Plavix for at least one year, and I will decrease ASA to 81 mg daily. Will schedule a return visit with Dr. Andee Lineman in 3 months.
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
## 2012-05-13 NOTE — Assessment & Plan Note (Signed)
We'll order followup metabolic profile, as well as a BNP level. If renal function is stable, then we'll consider resuming ACE inhibitor and/or Aldactone. Of note, he was on both these medications, when last seen here in clinic back in June.

## 2012-05-13 NOTE — Assessment & Plan Note (Signed)
Continue current dose Crestor. Reassess lipid status in 12 weeks. Target LDL 70 or less, if feasible.

## 2012-05-13 NOTE — Assessment & Plan Note (Signed)
Followed by Dr. James Allred 

## 2012-05-13 NOTE — Patient Instructions (Addendum)
   Decrease Aspirin to 81mg  daily  Labs:  BMET, BNP  Labs:  Due in 3 months for fasting lipid and liver panel  Office will contact with results  Nitroglycerin as needed for severe chest pain  Nurse visit in 7-10 days for vitals

## 2012-05-18 ENCOUNTER — Telehealth: Payer: Self-pay | Admitting: *Deleted

## 2012-05-18 DIAGNOSIS — Z79899 Other long term (current) drug therapy: Secondary | ICD-10-CM

## 2012-05-18 DIAGNOSIS — I2510 Atherosclerotic heart disease of native coronary artery without angina pectoris: Secondary | ICD-10-CM

## 2012-05-18 DIAGNOSIS — I10 Essential (primary) hypertension: Secondary | ICD-10-CM

## 2012-05-18 MED ORDER — SPIRONOLACTONE 25 MG PO TABS: 25.0000 mg | ORAL_TABLET | Freq: Every day | ORAL | Status: DC

## 2012-05-18 NOTE — Telephone Encounter (Signed)
Message copied by Lesle Chris on Tue May 18, 2012  3:23 PM ------      Message from: Rande Brunt      Created: Fri May 14, 2012  4:09 PM       Resume Aldactone 25 mg daily. Repeat BMET in 1 week.

## 2012-05-18 NOTE — Telephone Encounter (Signed)
Notes Recorded by Lesle Chris, LPN on 10/19/4008 at 3:22 PM Patient notified. Will give patient lab order tomorrow at nurse visit for blood pressure check. Notes Recorded by Lesle Chris, LPN on 10/22/2534 at 2:20 PM Left message to return call.

## 2012-05-19 ENCOUNTER — Ambulatory Visit (INDEPENDENT_AMBULATORY_CARE_PROVIDER_SITE_OTHER): Payer: PRIVATE HEALTH INSURANCE | Admitting: *Deleted

## 2012-05-19 VITALS — BP 143/81 | HR 96 | Ht 69.0 in | Wt 205.0 lb

## 2012-05-19 DIAGNOSIS — I10 Essential (primary) hypertension: Secondary | ICD-10-CM

## 2012-05-19 NOTE — Progress Notes (Signed)
Recent OV reviewed. Will increase Toprol XL to total of 150 mg daily, secondary to elevated resting basal HR.

## 2012-05-19 NOTE — Progress Notes (Signed)
Presents to office for nurse visit for vitals. Patient has taken all doses of medications without side effects noted. Patient denies dizziness, chest pain or shortness of breath.

## 2012-05-20 NOTE — Progress Notes (Signed)
Left message to return call 

## 2012-05-21 MED ORDER — METOPROLOL SUCCINATE ER 100 MG PO TB24: ORAL_TABLET | ORAL | Status: DC

## 2012-05-21 MED ORDER — NITROGLYCERIN 0.4 MG SL SUBL: 0.4000 mg | SUBLINGUAL_TABLET | SUBLINGUAL | Status: DC | PRN

## 2012-05-21 NOTE — Progress Notes (Signed)
Patient informed of below.  New rx sent to [facility] at pt request along with refill for Nitroglycerin.

## 2012-06-01 ENCOUNTER — Encounter: Payer: Self-pay | Admitting: Cardiology

## 2012-06-10 ENCOUNTER — Encounter: Payer: Self-pay | Admitting: *Deleted

## 2012-06-10 ENCOUNTER — Telehealth: Payer: Self-pay | Admitting: *Deleted

## 2012-06-10 NOTE — Telephone Encounter (Signed)
Labs stable. Continue current medication regimen.   ----- Message -----  From: Lesle Chris, LPN  Sent: 10/29/863 11:29 AM To: Prescott Parma, PA  Patient notified of results by letter.

## 2012-06-17 ENCOUNTER — Ambulatory Visit (INDEPENDENT_AMBULATORY_CARE_PROVIDER_SITE_OTHER): Payer: PRIVATE HEALTH INSURANCE | Admitting: *Deleted

## 2012-06-17 DIAGNOSIS — I519 Heart disease, unspecified: Secondary | ICD-10-CM

## 2012-06-17 DIAGNOSIS — Z95810 Presence of automatic (implantable) cardiac defibrillator: Secondary | ICD-10-CM

## 2012-06-24 ENCOUNTER — Encounter: Payer: Self-pay | Admitting: Internal Medicine

## 2012-06-24 ENCOUNTER — Other Ambulatory Visit: Payer: Self-pay | Admitting: Internal Medicine

## 2012-06-24 DIAGNOSIS — I428 Other cardiomyopathies: Secondary | ICD-10-CM

## 2012-06-24 LAB — REMOTE ICD DEVICE
ATRIAL PACING ICD: 0 pct
DEV-0020ICD: NEGATIVE
DEVICE MODEL ICD: 147011
FVT: 0
PACEART VT: 0
TZAT-0001SLOWVT: 2
TZAT-0013FASTVT: 4
TZAT-0013SLOWVT: 4
TZAT-0018FASTVT: NEGATIVE
TZAT-0018FASTVT: NEGATIVE
TZAT-0018SLOWVT: NEGATIVE
TZON-0003SLOWVT: 352.9 ms
TZST-0001FASTVT: 3
TZST-0001FASTVT: 4
TZST-0001FASTVT: 7
TZST-0001FASTVT: 8
TZST-0001SLOWVT: 3
TZST-0001SLOWVT: 6
TZST-0001SLOWVT: 7
TZST-0003FASTVT: 41 J
TZST-0003FASTVT: 41 J
TZST-0003FASTVT: 41 J
TZST-0003SLOWVT: 41 J
TZST-0003SLOWVT: 41 J
TZST-0003SLOWVT: 41 J
TZST-0003SLOWVT: 41 J
VENTRICULAR PACING ICD: 0 pct
VF: 0

## 2012-07-01 ENCOUNTER — Encounter: Payer: Self-pay | Admitting: *Deleted

## 2012-07-05 ENCOUNTER — Telehealth: Payer: Self-pay | Admitting: *Deleted

## 2012-07-05 MED ORDER — ROSUVASTATIN CALCIUM 20 MG PO TABS: 20.0000 mg | ORAL_TABLET | Freq: Every day | ORAL | Status: DC

## 2012-07-05 NOTE — Telephone Encounter (Signed)
Message copied by Eustace Moore on Mon Jul 05, 2012  8:51 AM ------      Message from: Rollene Rotunda      Created: Sun Jul 04, 2012  8:11 PM       Hypertensive BP response.  Schedule follow up at next available appt.

## 2012-07-05 NOTE — Telephone Encounter (Signed)
Notes Recorded by Lesle Chris, LPN on 04/54/0981 at 2:34 PM Patient notified. Will send 90 day supply to [facility] at pt request. Will send reminder in mail when time to repeat labs.

## 2012-07-05 NOTE — Telephone Encounter (Signed)
Message copied by Lesle Chris on Mon Jul 05, 2012  2:34 PM ------      Message from: Adam House      Created: Thu Jul 01, 2012  1:04 PM       LDL 125. Increase Crestor to 20 mg daily. FLP/LFT profile in 12 weeks

## 2012-07-05 NOTE — Telephone Encounter (Signed)
Patient informed and scheduled 07/09/12 w/[facility].

## 2012-07-09 ENCOUNTER — Ambulatory Visit (INDEPENDENT_AMBULATORY_CARE_PROVIDER_SITE_OTHER): Payer: PRIVATE HEALTH INSURANCE | Admitting: Cardiology

## 2012-07-09 ENCOUNTER — Encounter: Payer: Self-pay | Admitting: Cardiology

## 2012-07-09 VITALS — BP 128/80 | HR 83 | Ht 69.0 in | Wt 208.8 lb

## 2012-07-09 DIAGNOSIS — I2510 Atherosclerotic heart disease of native coronary artery without angina pectoris: Secondary | ICD-10-CM

## 2012-07-09 DIAGNOSIS — N189 Chronic kidney disease, unspecified: Secondary | ICD-10-CM

## 2012-07-09 DIAGNOSIS — I10 Essential (primary) hypertension: Secondary | ICD-10-CM

## 2012-07-09 DIAGNOSIS — I519 Heart disease, unspecified: Secondary | ICD-10-CM

## 2012-07-09 DIAGNOSIS — E785 Hyperlipidemia, unspecified: Secondary | ICD-10-CM

## 2012-07-09 DIAGNOSIS — F17200 Nicotine dependence, unspecified, uncomplicated: Secondary | ICD-10-CM

## 2012-07-09 MED ORDER — ISOSORB DINITRATE-HYDRALAZINE 20-37.5 MG PO TABS: ORAL_TABLET | ORAL | Status: DC

## 2012-07-09 NOTE — Patient Instructions (Addendum)
   Begin Bidil 20/37.5mg  - 1/2 tab three times per day  Continue all other current medications.  Labs - BMET, FLP, LFT - do a few days prior to next office visit Follow up in  1 month

## 2012-07-11 NOTE — Progress Notes (Signed)
Patient ID: Adam House, male   DOB: 1951/04/11, 61 y.o.   MRN: [id number] PCP: Dr. Felecia Shelling  61 yo with history of CAD and ischemic cardiomyopathy presents for cardiology followup.  He had an anterolateral MI in 8/13 treated with overlapping Promus stents in the LAD.  EF 25-30% on last echo.  He had post-MI pericarditis as well as acute on chronic systolic CHF during 8/13 admission and was diuresed 15 lbs.  He is now doing cardiac rehab at [facility].  He has some dyspnea: this will occur after he walks about 15 minutes on the treadmill or goes up a flight of steps.  No lightheadedness, no chest pain.  No orthopnea or PND.   He is still smoking 4-6 cigs/day.  He is not taking an ACEI due to angioedema.  Crestor was recently increased to 20 mg daily.  He is not on Lasix.   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 CKD 3. GERD 4. CAD: Anterior MI in 1998 with PCI to LAD.  Anterolateral MI in 8/13 with 100% mLAD, 70% dLAD treated with overlapping Promus DES.   5. Post-MI pericarditis in 8/13.  6. Ischemic cardiomyopathy: Last echo (8/13) with EF 25-30%, grade II diastolic dysfunction, apical akinesis, mildly dilated RV and moderately dilated LV, no significant valvular abnormalities.  He has a Environmental manager ICD.  7. Hyperlipidemia 8. Active smoker.  9. H/o angioedema with ACEI.   SH: Married, retired, lives in [place].  Rare ETOH, 4-6 cigarettes/day.   FH: CAD  ROS: All systems reviewed and negative except as per HPI.   Current Outpatient Prescriptions  Medication Sig Dispense Refill  . albuterol (PROVENTIL HFA;VENTOLIN HFA) 108 (90 BASE) MCG/ACT inhaler Inhale 2 puffs into the lungs every 6 (six) hours as needed. For shortness of breath      . allopurinol (ZYLOPRIM) 100 MG tablet Take 100 mg by mouth daily.      [name] aspirin EC 81 MG tablet Take 1 tablet (81 mg total) by mouth daily.      . Cholecalciferol (VITAMIN D-3) 5000 UNITS TABS Take  5,000 Units by mouth daily.      . clopidogrel (PLAVIX) 75 MG tablet Take 1 tablet (75 mg total) by mouth daily with breakfast.  30 tablet  11  . colchicine 0.6 MG tablet Take 1 tablet (0.6 mg total) by mouth daily as needed. For gout  30 tablet  11  . metoprolol succinate (TOPROL-XL) 100 MG 24 hr tablet Take 1 1/2 tabs (150mg ) by mouth daily  45 tablet  6  . nitroGLYCERIN (NITROSTAT) 0.4 MG SL tablet Place 1 tablet (0.4 mg total) under the tongue every 5 (five) minutes as needed for chest pain.  25 tablet  3  . pantoprazole (PROTONIX) 40 MG tablet Take 1 tablet (40 mg total) by mouth daily.  90 tablet  3  . rosuvastatin (CRESTOR) 20 MG tablet Take 1 tablet (20 mg total) by mouth daily.  90 tablet  3  . sildenafil (VIAGRA) 50 MG tablet Take 25 mg by mouth daily as needed. For erectile dysfunction      . spironolactone (ALDACTONE) 25 MG tablet Take 25 mg by mouth daily.      . isosorbide-hydrALAZINE (BIDIL) 20-37.5 MG per tablet Take 1/2 tab by mouth three times a day  45 tablet  6    BP 128/80  Pulse 83  Ht 5\' 9"  (1.753 m)  Wt 208  lb 12.8 oz (94.711 kg)  BMI 30.83 kg/m2  SpO2 99% General: NAD Neck: No JVD, no thyromegaly or thyroid nodule.  Lungs: Clear to auscultation bilaterally with normal respiratory effort. CV: Lateral PMI.  Heart regular S1/S2, no S3/S4, no murmur.  No peripheral edema.  No carotid bruit.  Normal pedal pulses.  Abdomen: Soft, nontender, no hepatosplenomegaly, no distention.  Neurologic: Alert and oriented x 3.  Psych: Normal affect. Extremities: No clubbing or cyanosis.   Assessment/Plan:  1. CAD: Recent anterolateral STEMI with overlapping DES to LAD.  No chest pain.  He is doing cardiac rehab.  Continue ASA 81, Plavix, statin, Toprol XL.  2. Chronic systolic CHF: Mr Abel looks euvolemic on exam.  He has NYHA class II-III symptoms.  He is not on ACEI due to history of angioedema.   - Continue Toprol XL 150 mg daily and spironolactone 25.  - I do not think  he needs Lasix at this time.  - Add Bidil 1/2 tab tid to be titrated up as tolerated.  - BMET in 1 month.  3. Hyperlipidemia: Crestor recently increased (LDL goal < 70, he was above goal and HDL also very low).  Lipids/LFTs in 1 month.  4. Smoking: I encouraged him to try nicotine patches to quit.   [name] 07/11/2012 10:39 AM

## 2012-07-13 ENCOUNTER — Telehealth: Payer: Self-pay | Admitting: Physician Assistant

## 2012-07-13 NOTE — Telephone Encounter (Signed)
Cant make rehab due to having symptoms from flu shot

## 2012-07-13 NOTE — Telephone Encounter (Signed)
Noted.  Left message on voice mail that he should notify [facility] - cardiac rehab.

## 2012-07-17 ENCOUNTER — Other Ambulatory Visit: Payer: Self-pay | Admitting: Cardiology

## 2012-08-11 ENCOUNTER — Encounter: Payer: Self-pay | Admitting: Cardiology

## 2012-08-11 ENCOUNTER — Ambulatory Visit (INDEPENDENT_AMBULATORY_CARE_PROVIDER_SITE_OTHER): Payer: PRIVATE HEALTH INSURANCE | Admitting: Cardiology

## 2012-08-11 VITALS — BP 128/84 | HR 68 | Ht 69.5 in | Wt 209.0 lb

## 2012-08-11 DIAGNOSIS — I10 Essential (primary) hypertension: Secondary | ICD-10-CM

## 2012-08-11 DIAGNOSIS — I519 Heart disease, unspecified: Secondary | ICD-10-CM

## 2012-08-11 DIAGNOSIS — E785 Hyperlipidemia, unspecified: Secondary | ICD-10-CM

## 2012-08-11 DIAGNOSIS — Z79899 Other long term (current) drug therapy: Secondary | ICD-10-CM

## 2012-08-11 DIAGNOSIS — I2510 Atherosclerotic heart disease of native coronary artery without angina pectoris: Secondary | ICD-10-CM

## 2012-08-11 MED ORDER — ROSUVASTATIN CALCIUM 40 MG PO TABS: 40.0000 mg | ORAL_TABLET | Freq: Every day | ORAL | Status: DC

## 2012-08-11 MED ORDER — ISOSORB DINITRATE-HYDRALAZINE 20-37.5 MG PO TABS: 1.0000 | ORAL_TABLET | Freq: Three times a day (TID) | ORAL | Status: DC

## 2012-08-11 NOTE — Patient Instructions (Signed)
   Increase Bidil to whole tablet three times per day (new prescription sent to pharmacy)  Increase Crestor to 40mg  every evening (may take 2 of your 20mg  tabs every evening till finish current supply) - new prescription sent to pharmacy Continue all other current medications.  Labs - due in 2 months (around October 11, 2012) for fasting lipid and liver panel, BMET  Office will contact with results Follow up in  3 months

## 2012-08-11 NOTE — Progress Notes (Signed)
Patient ID: Adam House, male   DOB: Jan 29, 1951, 61 y.o.   MRN: [id number] PCP: Dr. Felecia Shelling  60 yo with history of CAD and ischemic cardiomyopathy presents for cardiology followup.  He had an anterolateral MI in 8/13 treated with overlapping Promus stents in the LAD.  EF 25-30% on last echo.  He had post-MI pericarditis as well as acute on chronic systolic CHF during 8/13 admission and was diuresed 15 lbs.  He is now doing cardiac rehab at [facility].  He has some dyspnea: this will occur when walking up a hill.  He is doing ok walking on flat ground and using the exercise machines at cardiac rehab.  No lightheadedness, no chest pain.  No orthopnea or PND.   He is still smoking 5-6 cigs/day.  He is not taking an ACEI due to angioedema.    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 CKD 3. GERD 4. CAD: Anterior MI in 1998 with PCI to LAD.  Anterolateral MI in 8/13 with 100% mLAD, 70% dLAD treated with overlapping Promus DES.   5. Post-MI pericarditis in 8/13.  6. Ischemic cardiomyopathy: Last echo (8/13) with EF 25-30%, grade II diastolic dysfunction, apical akinesis, mildly dilated RV and moderately dilated LV, no significant valvular abnormalities.  He has a Environmental manager ICD.  7. Hyperlipidemia 8. Active smoker.  9. H/o angioedema with ACEI.   SH: Married, retired, lives in [place].  Rare ETOH, 4-6 cigarettes/day.   FH: CAD  ROS: All systems reviewed and negative except as per HPI.   Current Outpatient Prescriptions  Medication Sig Dispense Refill  . albuterol (PROVENTIL HFA;VENTOLIN HFA) 108 (90 BASE) MCG/ACT inhaler Inhale 2 puffs into the lungs every 6 (six) hours as needed. For shortness of breath      . ALDACTONE 25 MG tablet TAKE (1) TABLET BY MOUTH ONCE DAILY.  30 tablet  3  . allopurinol (ZYLOPRIM) 100 MG tablet Take 100 mg by mouth daily.      [name] aspirin EC 81 MG tablet Take 1  tablet (81 mg total) by mouth daily.      . clopidogrel (PLAVIX) 75 MG tablet Take 1 tablet (75 mg total) by mouth daily with breakfast.  30 tablet  11  . colchicine 0.6 MG tablet Take 1 tablet (0.6 mg total) by mouth daily as needed. For gout  30 tablet  11  . fish oil-omega-3 fatty acids 1000 MG capsule Take 2 g by mouth daily.      . metoprolol succinate (TOPROL-XL) 100 MG 24 hr tablet Take 1 1/2 tabs (150mg ) by mouth daily  45 tablet  6  . nitroGLYCERIN (NITROSTAT) 0.4 MG SL tablet Place 1 tablet (0.4 mg total) under the tongue every 5 (five) minutes as needed for chest pain.  25 tablet  3  . pantoprazole (PROTONIX) 40 MG tablet Take 1 tablet (40 mg total) by mouth daily.  90 tablet  3  . rosuvastatin (CRESTOR) 40 MG tablet Take 1 tablet (40 mg total) by mouth daily.  30 tablet  6  . isosorbide-hydrALAZINE (BIDIL) 20-37.5 MG per tablet Take 1 tablet by mouth 3 (three) times daily. Dose increased 08/11/2012.  90 tablet  6  . sildenafil (VIAGRA) 50 MG tablet Take 25 mg by mouth daily as needed. For erectile dysfunction        BP 128/84  Pulse 68  Ht  5' 9.5" (1.765 m)  Wt 209 lb (94.802 kg)  BMI 30.42 kg/m2  SpO2 92% General: NAD Neck: No JVD, no thyromegaly or thyroid nodule.  Lungs: Clear to auscultation bilaterally with normal respiratory effort. CV: Lateral PMI.  Heart regular S1/S2, no S3/S4, no murmur.  No peripheral edema.  No carotid bruit.  Normal pedal pulses.  Abdomen: Soft, nontender, no hepatosplenomegaly, no distention.  Neurologic: Alert and oriented x 3.  Psych: Normal affect. Extremities: No clubbing or cyanosis.   Assessment/Plan:  1. CAD: Recent anterolateral STEMI with overlapping DES to LAD.  No chest pain.  He is doing cardiac rehab.  Continue ASA 81, Plavix, statin, Toprol XL.  2. Chronic systolic CHF: [name] looks euvolemic on exam.  He has NYHA class II-III symptoms.  He is not on ACEI due to history of angioedema.   - Continue Toprol XL 150 mg daily and  spironolactone 25.  - I do not think he needs Lasix at this time.  - Increase Bidil to 1 tab tid.  - BMET/BNP in 2 months.  3. Hyperlipidemia: With LDL still 89 and HDL low, increase Crestor to 40 mg daily.  Will get lipids/LFTs in 2 mos. 4. Smoking: I again encouraged him to try nicotine patches to quit.   [name] 08/11/2012 2:21 PM

## 2012-08-18 ENCOUNTER — Ambulatory Visit: Payer: PRIVATE HEALTH INSURANCE | Admitting: Physician Assistant

## 2012-08-24 ENCOUNTER — Inpatient Hospital Stay (HOSPITAL_COMMUNITY)
Admission: AD | Admit: 2012-08-24 | Discharge: 2012-08-27 | DRG: 280 | Disposition: A | Discharge status: Home / Self Care | Payer: PRIVATE HEALTH INSURANCE | Source: Other Acute Inpatient Hospital | Attending: Cardiology | Admitting: Cardiology

## 2012-08-24 ENCOUNTER — Encounter (HOSPITAL_COMMUNITY): Payer: Self-pay | Admitting: *Deleted

## 2012-08-24 ENCOUNTER — Other Ambulatory Visit: Payer: Self-pay | Admitting: Physician Assistant

## 2012-08-24 ENCOUNTER — Inpatient Hospital Stay (HOSPITAL_COMMUNITY): Payer: PRIVATE HEALTH INSURANCE

## 2012-08-24 DIAGNOSIS — R0989 Other specified symptoms and signs involving the circulatory and respiratory systems: Secondary | ICD-10-CM | POA: Diagnosis present

## 2012-08-24 DIAGNOSIS — N189 Chronic kidney disease, unspecified: Secondary | ICD-10-CM | POA: Diagnosis present

## 2012-08-24 DIAGNOSIS — E876 Hypokalemia: Secondary | ICD-10-CM | POA: Diagnosis present

## 2012-08-24 DIAGNOSIS — K219 Gastro-esophageal reflux disease without esophagitis: Secondary | ICD-10-CM | POA: Diagnosis present

## 2012-08-24 DIAGNOSIS — I714 Abdominal aortic aneurysm, without rupture, unspecified: Secondary | ICD-10-CM | POA: Diagnosis present

## 2012-08-24 DIAGNOSIS — Z7982 Long term (current) use of aspirin: Secondary | ICD-10-CM

## 2012-08-24 DIAGNOSIS — I214 Non-ST elevation (NSTEMI) myocardial infarction: Secondary | ICD-10-CM

## 2012-08-24 DIAGNOSIS — E785 Hyperlipidemia, unspecified: Secondary | ICD-10-CM | POA: Diagnosis present

## 2012-08-24 DIAGNOSIS — Z9861 Coronary angioplasty status: Secondary | ICD-10-CM

## 2012-08-24 DIAGNOSIS — I10 Essential (primary) hypertension: Secondary | ICD-10-CM | POA: Diagnosis present

## 2012-08-24 DIAGNOSIS — F17200 Nicotine dependence, unspecified, uncomplicated: Secondary | ICD-10-CM | POA: Diagnosis present

## 2012-08-24 DIAGNOSIS — N182 Chronic kidney disease, stage 2 (mild): Secondary | ICD-10-CM | POA: Diagnosis present

## 2012-08-24 DIAGNOSIS — I2589 Other forms of chronic ischemic heart disease: Secondary | ICD-10-CM | POA: Diagnosis present

## 2012-08-24 DIAGNOSIS — I5023 Acute on chronic systolic (congestive) heart failure: Secondary | ICD-10-CM | POA: Diagnosis present

## 2012-08-24 DIAGNOSIS — Z79899 Other long term (current) drug therapy: Secondary | ICD-10-CM

## 2012-08-24 DIAGNOSIS — I2510 Atherosclerotic heart disease of native coronary artery without angina pectoris: Secondary | ICD-10-CM | POA: Diagnosis present

## 2012-08-24 DIAGNOSIS — Z7902 Long term (current) use of antithrombotics/antiplatelets: Secondary | ICD-10-CM

## 2012-08-24 DIAGNOSIS — I252 Old myocardial infarction: Secondary | ICD-10-CM

## 2012-08-24 DIAGNOSIS — I5022 Chronic systolic (congestive) heart failure: Secondary | ICD-10-CM

## 2012-08-24 HISTORY — DX: Dressler's syndrome: I24.1

## 2012-08-24 HISTORY — DX: Pure hypercholesterolemia, unspecified: E78.00

## 2012-08-24 HISTORY — DX: Unspecified systolic (congestive) heart failure: I50.20

## 2012-08-24 HISTORY — DX: Presence of automatic (implantable) cardiac defibrillator: Z95.810

## 2012-08-24 HISTORY — DX: Acute myocardial infarction, unspecified: I21.9

## 2012-08-24 HISTORY — DX: Cardiac murmur, unspecified: R01.1

## 2012-08-24 HISTORY — DX: Cerebral infarction, unspecified: I63.9

## 2012-08-24 HISTORY — DX: Gout, unspecified: M10.9

## 2012-08-24 LAB — MRSA PCR SCREENING: MRSA by PCR: NEGATIVE

## 2012-08-24 LAB — TROPONIN I
Troponin I: 1.45 ng/mL (ref <0.30)
Troponin I: 1.63 ng/mL (ref <0.30)

## 2012-08-24 LAB — HEPARIN LEVEL (UNFRACTIONATED): Heparin Unfractionated: <0.1 IU/mL — ABNORMAL LOW (ref 0.30–0.70)

## 2012-08-24 MED ORDER — SODIUM CHLORIDE 0.9 % IJ SOLN
3.0000 mL | Freq: Two times a day (BID) | INTRAMUSCULAR | Status: DC
  Administered 2012-08-24 – 2012-08-26 (×3): 3 mL via INTRAVENOUS

## 2012-08-24 MED ORDER — SODIUM CHLORIDE 0.9 % IJ SOLN
3.0000 mL | Freq: Two times a day (BID) | INTRAMUSCULAR | Status: DC
  Administered 2012-08-24 – 2012-08-26 (×5): 3 mL via INTRAVENOUS

## 2012-08-24 MED ORDER — HEPARIN (PORCINE) IN NACL 100-0.45 UNIT/ML-% IJ SOLN
2000.0000 [IU]/h | INTRAMUSCULAR | Status: DC
  Administered 2012-08-24: 1000 [IU]/h via INTRAVENOUS
  Administered 2012-08-25 (×2): 1600 [IU]/h via INTRAVENOUS
  Administered 2012-08-25: 1750 [IU]/h via INTRAVENOUS
  Filled 2012-08-24 (×6): qty 250

## 2012-08-24 MED ORDER — ONDANSETRON HCL 4 MG/2ML IJ SOLN: 4.0000 mg | Freq: Four times a day (QID) | INTRAMUSCULAR | Status: DC | PRN

## 2012-08-24 MED ORDER — SODIUM CHLORIDE 0.9 % IV SOLN: 250.0000 mL | INTRAVENOUS | Status: DC | PRN

## 2012-08-24 MED ORDER — SODIUM CHLORIDE 0.9 % IV SOLN
250.0000 mL | INTRAVENOUS | Status: DC | PRN
  Administered 2012-08-25: 250 mL via INTRAVENOUS

## 2012-08-24 MED ORDER — XENON XE 133 GAS
19.0000 | GAS_FOR_INHALATION | Freq: Once | RESPIRATORY_TRACT | Status: AC | PRN
  Administered 2012-08-24: 19 via RESPIRATORY_TRACT

## 2012-08-24 MED ORDER — SPIRONOLACTONE 25 MG PO TABS
25.0000 mg | ORAL_TABLET | Freq: Every day | ORAL | Status: DC
  Administered 2012-08-24 – 2012-08-27 (×4): 25 mg via ORAL
  Filled 2012-08-24 (×4): qty 1

## 2012-08-24 MED ORDER — ASPIRIN EC 81 MG PO TBEC
81.0000 mg | DELAYED_RELEASE_TABLET | Freq: Every day | ORAL | Status: DC
  Administered 2012-08-24 – 2012-08-27 (×4): 81 mg via ORAL
  Filled 2012-08-24 (×4): qty 1

## 2012-08-24 MED ORDER — METOPROLOL SUCCINATE ER 100 MG PO TB24
100.0000 mg | ORAL_TABLET | Freq: Every day | ORAL | Status: DC
  Administered 2012-08-24: 100 mg via ORAL
  Filled 2012-08-24 (×2): qty 1

## 2012-08-24 MED ORDER — SODIUM CHLORIDE 0.9 % IJ SOLN: 3.0000 mL | INTRAMUSCULAR | Status: DC | PRN

## 2012-08-24 MED ORDER — ATORVASTATIN CALCIUM 80 MG PO TABS
80.0000 mg | ORAL_TABLET | Freq: Every day | ORAL | Status: DC
  Administered 2012-08-24 – 2012-08-26 (×3): 80 mg via ORAL
  Filled 2012-08-24 (×4): qty 1

## 2012-08-24 MED ORDER — ASPIRIN 81 MG PO CHEW: 324.0000 mg | CHEWABLE_TABLET | ORAL | Status: DC

## 2012-08-24 MED ORDER — FUROSEMIDE 10 MG/ML IJ SOLN
40.0000 mg | Freq: Three times a day (TID) | INTRAMUSCULAR | Status: DC
  Administered 2012-08-24 (×2): 40 mg via INTRAVENOUS
  Filled 2012-08-24 (×5): qty 4

## 2012-08-24 MED ORDER — ALLOPURINOL 100 MG PO TABS
100.0000 mg | ORAL_TABLET | Freq: Every day | ORAL | Status: DC
  Administered 2012-08-24 – 2012-08-27 (×4): 100 mg via ORAL
  Filled 2012-08-24 (×4): qty 1

## 2012-08-24 MED ORDER — ACETAMINOPHEN 325 MG PO TABS: 650.0000 mg | ORAL_TABLET | ORAL | Status: DC | PRN

## 2012-08-24 MED ORDER — TECHNETIUM TO 99M ALBUMIN AGGREGATED
3.0000 | Freq: Once | INTRAVENOUS | Status: AC | PRN
  Administered 2012-08-24: 3 via INTRAVENOUS

## 2012-08-24 MED ORDER — COLCHICINE 0.6 MG PO TABS
0.6000 mg | ORAL_TABLET | ORAL | Status: DC | PRN
  Filled 2012-08-24: qty 1

## 2012-08-24 MED ORDER — HEPARIN BOLUS VIA INFUSION
3000.0000 [IU] | Freq: Once | INTRAVENOUS | Status: AC
  Administered 2012-08-24: 3000 [IU] via INTRAVENOUS
  Filled 2012-08-24: qty 3000

## 2012-08-24 MED ORDER — PANTOPRAZOLE SODIUM 40 MG PO TBEC
40.0000 mg | DELAYED_RELEASE_TABLET | Freq: Every day | ORAL | Status: DC
  Administered 2012-08-24 – 2012-08-26 (×3): 40 mg via ORAL
  Filled 2012-08-24 (×3): qty 1

## 2012-08-24 MED ORDER — CLOPIDOGREL BISULFATE 75 MG PO TABS
75.0000 mg | ORAL_TABLET | Freq: Every day | ORAL | Status: DC
  Administered 2012-08-25 – 2012-08-27 (×3): 75 mg via ORAL
  Filled 2012-08-24 (×4): qty 1

## 2012-08-24 MED ORDER — NITROGLYCERIN 0.4 MG SL SUBL: 0.4000 mg | SUBLINGUAL_TABLET | SUBLINGUAL | Status: DC | PRN

## 2012-08-24 MED ORDER — ISOSORB DINITRATE-HYDRALAZINE 20-37.5 MG PO TABS
1.0000 | ORAL_TABLET | Freq: Three times a day (TID) | ORAL | Status: DC
  Administered 2012-08-24 (×2): 1 via ORAL
  Filled 2012-08-24 (×5): qty 1

## 2012-08-24 NOTE — Progress Notes (Signed)
ANTICOAGULATION CONSULT NOTE - Follow-Up Consult  Pharmacy Consult for  Heparin Indication: ACS/STEMI  Allergies  Allergen Reactions  . Lisinopril Anaphylaxis and Swelling    angioedema  . Naproxen Hives   Patient Measurements: Height: 5' 9.5" (176.5 cm) Weight: 211 lb 6.7 oz (95.9 kg) IBW/kg (Calculated) : 71.85  Heparin Dosing Weight: 79 kg  Labs:  Basename 08/24/12 1740 08/24/12 1643  HGB -- --  HCT -- --  PLT -- --  APTT -- --  LABPROT -- --  INR -- --  HEPARINUNFRC <0.10* --  CREATININE -- --  CKTOTAL -- --  CKMB -- --  TROPONINI -- 1.45*    Estimated Creatinine Clearance: 57 ml/min (by C-G formula based on Cr of 1.57).  Medical History: Past Medical History  Diagnosis Date  . CAD (coronary artery disease)     s/p PCI after MI 1998  . Congestive heart failure, unspecified     NYHA Class II/III  . Ischemic cardiomyopathy     CM (EF 30-35%); s/p AICD 2011  . CRI (chronic renal insufficiency)   . Unspecified essential hypertension   . Impotence of organic origin   . 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 Esophageal reflux   . 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 Tobacco use disorder   . Automatic implantable cardiac defibrillator in situ    Medications:  Scheduled:     . allopurinol  100 mg Oral Daily  . aspirin  324 mg Oral Pre-Cath  . aspirin EC  81 mg Oral Daily  . atorvastatin  80 mg Oral q1800  . clopidogrel  75 mg Oral Q breakfast  . furosemide  40 mg Intravenous TID  . isosorbide-hydrALAZINE  1 tablet Oral TID  . metoprolol succinate  100 mg Oral Daily  . pantoprazole  40 mg Oral Q1200  . sodium chloride  3 mL Intravenous Q12H  . sodium chloride  3 mL Intravenous Q12H  . spironolactone  25 mg Oral Daily    Assessment: 61yo male admitted as a transfer from [place].  He has a history of CAD with MI, PCI with stent  to the LAD.  He also has some cardiomyopathy with EF of 25-30% on his last Echo.  He was started on IV Heparin with a 4000 unit bolus and an infusion at 1000 units/hr at ~ 11AM today.  Heparin undetectable at < 0.10  Goal of Therapy:  Heparin level 0.3-0.7 units/ml Monitor platelets by anticoagulation protocol: Yes   Plan:  1) Heparin 3000 units iv bolus x 1 2) Increase heparin to 1300 units / hr 3) 6 hour heparin level  Thank you. Okey Regal, PharmD [phone]  08/24/2012,6:28 PM

## 2012-08-24 NOTE — Progress Notes (Signed)
ANTICOAGULATION CONSULT NOTE - Initial Consult  Pharmacy Consult for  Heparin Indication: ACS/STEMI  Allergies  Allergen Reactions  . Lisinopril Swelling    angioedema  . Naproxen Hives   Patient Measurements: Height: 5' 9.5" (176.5 cm) Weight: 211 lb 6.7 oz (95.9 kg) IBW/kg (Calculated) : 71.85  Heparin Dosing Weight: 79 kg  Vital Signs: Temp: 98.1 F (36.7 C) (12/10 1415) Temp src: Oral (12/10 1415) BP: 100/64 mmHg (12/10 1415) Pulse Rate: 80  (12/10 1415)  Labs: No results found for this basename: HGB:2,HCT:3,PLT:3,APTT:3,LABPROT:3,INR:3,HEPARINUNFRC:3,CREATININE:3,CKTOTAL:3,CKMB:3,TROPONINI:3 in the last 72 hours  Estimated Creatinine Clearance: 57 ml/min (by C-G formula based on Cr of 1.57).  Medical History: Past Medical History  Diagnosis Date  . CAD (coronary artery disease)     s/p PCI after MI 1998  . Congestive heart failure, unspecified     NYHA Class II/III  . Ischemic cardiomyopathy     CM (EF 30-35%); s/p AICD 2011  . CRI (chronic renal insufficiency)   . Unspecified essential hypertension   . Impotence of organic origin   . 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 Esophageal reflux   . 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 Tobacco use disorder   . Automatic implantable cardiac defibrillator in situ    Medications:  Scheduled:    . allopurinol  100 mg Oral Daily  . aspirin  324 mg Oral Pre-Cath  . aspirin EC  81 mg Oral Daily  . atorvastatin  80 mg Oral q1800  . clopidogrel  75 mg Oral Q breakfast  . furosemide  40 mg Intravenous TID  . isosorbide-hydrALAZINE  1 tablet Oral TID  . metoprolol succinate  100 mg Oral Daily  . pantoprazole  40 mg Oral Q1200  . sodium chloride  3 mL Intravenous Q12H  . sodium chloride  3 mL Intravenous Q12H  . spironolactone  25 mg Oral Daily    Assessment: 61yo male admitted as a transfer  from [place].  He has a history of CAD with MI, PCI with stent to the LAD.  He also has some cardiomyopathy with EF of 25-30% on his last Echo.  He was started on IV Heparin with a 4000 unit bolus and an infusion at 1000 units/hr at ~ 11AM today.  Goal of Therapy:  Heparin level 0.3-0.7 units/ml Monitor platelets by anticoagulation protocol: Yes   Plan:   Will continue current Heparin rate of 1000 units/hr  Check a heparin level in 6 hours and adjust therapy as needed.  Nadara Mustard, PharmD., MS Clinical Pharmacist Pager:  [phone] Thank you for allowing pharmacy to be part of this patients care team. 08/24/2012,2:47 PM

## 2012-08-25 ENCOUNTER — Ambulatory Visit (HOSPITAL_COMMUNITY): Admit: 2012-08-25 | Payer: Self-pay | Admitting: Cardiovascular Disease

## 2012-08-25 ENCOUNTER — Encounter (HOSPITAL_COMMUNITY)
Admission: AD | Disposition: A | Discharge status: Home / Self Care | Payer: Self-pay | Source: Other Acute Inpatient Hospital | Attending: Cardiology

## 2012-08-25 DIAGNOSIS — I509 Heart failure, unspecified: Secondary | ICD-10-CM

## 2012-08-25 DIAGNOSIS — I5023 Acute on chronic systolic (congestive) heart failure: Secondary | ICD-10-CM

## 2012-08-25 DIAGNOSIS — I5022 Chronic systolic (congestive) heart failure: Secondary | ICD-10-CM

## 2012-08-25 DIAGNOSIS — I214 Non-ST elevation (NSTEMI) myocardial infarction: Secondary | ICD-10-CM

## 2012-08-25 LAB — CBC
HCT: 36.5 % — ABNORMAL LOW (ref 39.0–52.0)
Hemoglobin: 12.3 g/dL — ABNORMAL LOW (ref 13.0–17.0)
MCH: 28.3 pg (ref 26.0–34.0)
MCHC: 33.7 g/dL (ref 30.0–36.0)
MCV: 83.9 fL (ref 78.0–100.0)

## 2012-08-25 LAB — BASIC METABOLIC PANEL
BUN: 15 mg/dL (ref 6–23)
CO2: 23 mEq/L (ref 19–32)
Calcium: 9.1 mg/dL (ref 8.4–10.5)
Chloride: 103 mEq/L (ref 96–112)
Creatinine, Ser: 1.69 mg/dL — ABNORMAL HIGH (ref 0.50–1.35)
GFR calc Af Amer: 49 mL/min — ABNORMAL LOW (ref >90)

## 2012-08-25 LAB — LIPID PANEL
HDL: 23 mg/dL — ABNORMAL LOW (ref >39)
LDL Cholesterol: 103 mg/dL — ABNORMAL HIGH (ref 0–99)
Triglycerides: 112 mg/dL (ref <150)
VLDL: 22 mg/dL (ref 0–40)

## 2012-08-25 SURGERY — LEFT AND RIGHT HEART CATHETERIZATION WITH CORONARY ANGIOGRAM
Anesthesia: LOCAL

## 2012-08-25 MED ORDER — ISOSORB DINITRATE-HYDRALAZINE 20-37.5 MG PO TABS
0.5000 | ORAL_TABLET | Freq: Three times a day (TID) | ORAL | Status: DC
  Administered 2012-08-25 (×3): 0.5 via ORAL
  Filled 2012-08-25 (×6): qty 0.5

## 2012-08-25 MED ORDER — FUROSEMIDE 10 MG/ML IJ SOLN
40.0000 mg | Freq: Three times a day (TID) | INTRAMUSCULAR | Status: DC
  Administered 2012-08-25 – 2012-08-26 (×4): 40 mg via INTRAVENOUS
  Filled 2012-08-25 (×4): qty 4

## 2012-08-25 MED ORDER — METOPROLOL SUCCINATE ER 50 MG PO TB24: 50.0000 mg | ORAL_TABLET | Freq: Every day | ORAL | Status: DC

## 2012-08-25 MED ORDER — METOPROLOL SUCCINATE ER 50 MG PO TB24
75.0000 mg | ORAL_TABLET | Freq: Every day | ORAL | Status: DC
  Administered 2012-08-25 – 2012-08-26 (×2): 75 mg via ORAL
  Filled 2012-08-25 (×3): qty 1

## 2012-08-25 MED ORDER — HEPARIN BOLUS VIA INFUSION
2000.0000 [IU] | Freq: Once | INTRAVENOUS | Status: AC
  Administered 2012-08-25: 2000 [IU] via INTRAVENOUS
  Filled 2012-08-25: qty 2000

## 2012-08-25 NOTE — Progress Notes (Signed)
ANTICOAGULATION CONSULT NOTE - Follow-Up Consult  Pharmacy Consult for  Heparin Indication: ACS/STEMI  Allergies  Allergen Reactions  . Lisinopril Anaphylaxis and Swelling    angioedema  . Naproxen Hives   Patient Measurements: Height: 5' 9.5" (176.5 cm) Weight: 211 lb 6.7 oz (95.9 kg) IBW/kg (Calculated) : 71.85   Labs:  Basename 08/25/12 0207 08/24/12 2011 08/24/12 1740 08/24/12 1643  HGB -- -- -- --  HCT -- -- -- --  PLT -- -- -- --  APTT -- -- -- --  LABPROT -- -- -- --  INR -- -- -- --  HEPARINUNFRC <0.10* -- <0.10* --  CREATININE -- -- -- --  CKTOTAL -- -- -- --  CKMB -- -- -- --  TROPONINI -- 1.63* -- 1.45*    Estimated Creatinine Clearance: 57 ml/min (by C-G formula based on Cr of 1.57).  Medical History: Past Medical History  Diagnosis Date  . CAD (coronary artery disease)     s/p PCI after MI 1998  . Congestive heart failure, unspecified     NYHA Class II/III  . Ischemic cardiomyopathy     CM (EF 30-35%); s/p AICD 2011  . CRI (chronic renal insufficiency)   . Unspecified essential hypertension   . Impotence of organic origin   . Esophageal reflux   . 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 Tobacco use disorder   . ICD (implantable cardiac defibrillator) in place   . Hypercholesteremia   . Heart murmur   . Myocardial infarction 1998; 04/2012  . Exertional dyspnea   . Unspecified cerebral artery occlusion with cerebral infarction 11/2010    denies residual (08/24/2012)  . Gout    Medications:  Scheduled:     . allopurinol  100 mg Oral Daily  . aspirin  324 mg Oral Pre-Cath  . aspirin EC  81 mg Oral Daily  . atorvastatin  80 mg Oral q1800  . clopidogrel  75 mg Oral Q breakfast  . furosemide  40 mg Intravenous TID  . [COMPLETED] heparin  3,000 Units Intravenous Once  . isosorbide-hydrALAZINE  1 tablet Oral TID  . metoprolol succinate  100 mg Oral Daily  .  pantoprazole  40 mg Oral Q1200  . sodium chloride  3 mL Intravenous Q12H  . sodium chloride  3 mL Intravenous Q12H  . spironolactone  25 mg Oral Daily    Assessment: 61yo male admitted as a transfer from [place].  He has a history of CAD with MI, PCI with stent to the LAD on IV heparin. Plan for possible cath today. Heparin level (<0.1) is below-goal on 1300 units/hr. No problem with line / infusion per RN.  Goal of Therapy:  Heparin level 0.3-0.7 units/ml Monitor platelets by anticoagulation protocol: Yes   Plan:  1. Heparin IV bolus of 2000 units x 1, then increase IV heparin to 1600 units/hr. 2. Heparin level in 6 hours.   Lorre Munroe, PharmD 08/25/2012,2:54 AM

## 2012-08-25 NOTE — Progress Notes (Addendum)
ANTICOAGULATION CONSULT NOTE - Follow-Up Consult  Pharmacy Consult for  Heparin Indication: ACS/STEMI  Allergies  Allergen Reactions  . Lisinopril Anaphylaxis and Swelling    angioedema  . Naproxen Hives   Patient Measurements: Height: 5' 9.5" (176.5 cm) Weight: 204 lb 2.3 oz (92.6 kg) IBW/kg (Calculated) : 71.85  Heparin Dosing Weight: 79 kg  Labs:  Basename 08/25/12 0843 08/25/12 0207 08/25/12 0204 08/25/12 0200 08/24/12 2011 08/24/12 1740 08/24/12 1643  HGB 12.3* -- -- -- -- -- --  HCT 36.5* -- -- -- -- -- --  PLT 274 -- -- -- -- -- --  APTT -- -- -- -- -- -- --  LABPROT -- -- -- -- -- -- --  INR -- -- -- -- -- -- --  HEPARINUNFRC 0.19* <0.10* -- -- -- <0.10* --  CREATININE -- -- 1.69* -- -- -- --  CKTOTAL -- -- -- -- -- -- --  CKMB -- -- -- -- -- -- --  TROPONINI -- -- -- 1.27* 1.63* -- 1.45*    Estimated Creatinine Clearance: 52.1 ml/min (by C-G formula based on Cr of 1.69).  Medical History: Past Medical History  Diagnosis Date  . CAD (coronary artery disease)     s/p PCI after MI 1998  . Congestive heart failure, unspecified     NYHA Class II/III  . Ischemic cardiomyopathy     CM (EF 30-35%); s/p AICD 2011  . CRI (chronic renal insufficiency)   . Unspecified essential hypertension   . Impotence of organic origin   . Esophageal reflux   . 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 Tobacco use disorder   . ICD (implantable cardiac defibrillator) in place   . Hypercholesteremia   . Heart murmur   . Myocardial infarction 1998; 04/2012  . Exertional dyspnea   . Unspecified cerebral artery occlusion with cerebral infarction 11/2010    denies residual (08/24/2012)  . Gout    Medications:  Scheduled:     . allopurinol  100 mg Oral Daily  . aspirin  324 mg Oral Pre-Cath  . aspirin EC  81 mg Oral Daily  . atorvastatin  80 mg Oral q1800  . clopidogrel  75 mg Oral Q breakfast  .  furosemide  40 mg Intravenous Q8H  . [COMPLETED] heparin  2,000 Units Intravenous Once  . [COMPLETED] heparin  3,000 Units Intravenous Once  . isosorbide-hydrALAZINE  0.5 tablet Oral TID  . metoprolol succinate  75 mg Oral Daily  . pantoprazole  40 mg Oral Q1200  . sodium chloride  3 mL Intravenous Q12H  . sodium chloride  3 mL Intravenous Q12H  . spironolactone  25 mg Oral Daily  . [DISCONTINUED] furosemide  40 mg Intravenous TID  . [DISCONTINUED] isosorbide-hydrALAZINE  1 tablet Oral TID  . [DISCONTINUED] metoprolol succinate  100 mg Oral Daily  . [DISCONTINUED] metoprolol succinate  50 mg Oral Daily    Assessment: 61yo male admitted as a transfer from [place].  He has a history of CAD with MI, PCI with stent to the LAD.  He also has some cardiomyopathy with EF of 25-30% on his last Echo.  He was started on IV Heparin with initial level undetectable.  Rate increased to 1600 units last evening.  Heparin level at  0.19 after rate increase.  He is without  Goal of Therapy:  Heparin level 0.3-0.7 units/ml Monitor platelets by anticoagulation protocol: Yes   Plan:  1)  Increase heparin to 1750 units / hr 3) Recheck 8 hour heparin level  Nadara Mustard, PharmD., MS Clinical Pharmacist Pager:  [phone] Thank you for allowing pharmacy to be part of this patients care team. 08/25/2012,10:23 AM

## 2012-08-25 NOTE — Progress Notes (Signed)
Pt ordered 0.5 tab of Bidil at 2200. RN called pharmacy to verify that breaking the whole tab on hand in half and giving to the pt was acceptable. Other half tab wasted in sharps box.

## 2012-08-25 NOTE — Progress Notes (Signed)
Pt ambulated in hallway x2 lapsPt denies chest pain but was short of breath. Pt placed back on 2L [place] and sats were 95-96. Pt resting in chair

## 2012-08-25 NOTE — Progress Notes (Signed)
Patient ID: Adam House, male   DOB: 05/15/1951, 61 y.o.   MRN: [id number]    SUBJECTIVE: Patient is breathing better this morning.  He has not had chest pain.  States that dyspnea significantly worsened 2 days ago but there has been some gradual onset to exertional dyspnea.  TnI peaked at 1.63.      [name] allopurinol  100 mg Oral Daily  . aspirin  324 mg Oral Pre-Cath  . aspirin EC  81 mg Oral Daily  . atorvastatin  80 mg Oral q1800  . clopidogrel  75 mg Oral Q breakfast  . furosemide  40 mg Intravenous Q8H  . [COMPLETED] heparin  2,000 Units Intravenous Once  . [COMPLETED] heparin  3,000 Units Intravenous Once  . isosorbide-hydrALAZINE  0.5 tablet Oral TID  . metoprolol succinate  75 mg Oral Daily  . pantoprazole  40 mg Oral Q1200  . sodium chloride  3 mL Intravenous Q12H  . sodium chloride  3 mL Intravenous Q12H  . spironolactone  25 mg Oral Daily  . [DISCONTINUED] furosemide  40 mg Intravenous TID  . [DISCONTINUED] isosorbide-hydrALAZINE  1 tablet Oral TID  . [DISCONTINUED] metoprolol succinate  100 mg Oral Daily  . [DISCONTINUED] metoprolol succinate  50 mg Oral Daily      Filed Vitals:   08/25/12 0000 08/25/12 0030 08/25/12 0349 08/25/12 0623  BP:   98/61   Pulse: 85 90 83   Temp:   98.8 F (37.1 C)   TempSrc:   Oral   Resp: 26 25 13    Height:      Weight:    204 lb 2.3 oz (92.6 kg)  SpO2: 96% 97% 94%     Intake/Output Summary (Last 24 hours) at 08/25/12 0656 Last data filed at 08/25/12 0500  Gross per 24 hour  Intake    840 ml  Output   2220 ml  Net  -1380 ml    LABS: Basic Metabolic Panel:  Basename 08/25/12 0204  NA 139  K 3.8  CL 103  CO2 23  GLUCOSE 97  BUN 15  CREATININE 1.69*  CALCIUM 9.1  MG 1.7  PHOS --   Liver Function Tests: No results found for this basename: AST:2,ALT:2,ALKPHOS:2,BILITOT:2,PROT:2,ALBUMIN:2 in the last 72 hours No results found for this basename: LIPASE:2,AMYLASE:2 in the last 72 hours CBC: No results found for  this basename: WBC:2,NEUTROABS:2,HGB:2,HCT:2,MCV:2,PLT:2 in the last 72 hours Cardiac Enzymes:  Basename 08/25/12 0200 08/24/12 2011 08/24/12 1643  CKTOTAL -- -- --  CKMB -- -- --  CKMBINDEX -- -- --  TROPONINI 1.27* 1.63* 1.45*   BNP: No components found with this basename: POCBNP:3 D-Dimer: No results found for this basename: DDIMER:2 in the last 72 hours Hemoglobin A1C: No results found for this basename: HGBA1C in the last 72 hours Fasting Lipid Panel:  Basename 08/25/12 0204  CHOL 148  HDL 23*  LDLCALC 103*  TRIG 112  CHOLHDL 6.4  LDLDIRECT --   Thyroid Function Tests: No results found for this basename: TSH,T4TOTAL,FREET3,T3FREE,THYROIDAB in the last 72 hours Anemia Panel: No results found for this basename: VITAMINB12,FOLATE,FERRITIN,TIBC,IRON,RETICCTPCT in the last 72 hours  RADIOLOGY: Nm Pulmonary Perf And Vent  08/24/2012  *RADIOLOGY REPORT*  Clinical Data: Shortness of breath, elevated D-dimer, chronic renal insufficiency.  Companion chest radiograph shows bilateral alveolar pulmonary edema.  VENTILATION PERFUSION SCINTIGRAPHY  Findings: Ventilation imaging with Xe-133 inhaled shows homogeneous distribution throughout both lungs with no focal retention on the washout images. Perfusion imaging with 3.40mCi  Tc71m MAA IV shows a defect from the overlying left subclavian AICD device.  There is a somewhat heterogeneous distribution throughout the lungs with no discrete segmental or subsegmental perfusion defects.  IMPRESSION 1.  Low likelihood ratio for pulmonary embolism.   Original Report Authenticated By: D. Andria Rhein, MD     PHYSICAL EXAM General: NAD Neck: JVP 8-9 cm, +HJR, no thyromegaly or thyroid nodule.  Lungs: Crackles at bases bilaterally CV: Nondisplaced PMI.  Heart regular S1/S2, no S3/S4, no murmur.  No peripheral edema.  No carotid bruit.  Normal pedal pulses.  Abdomen: Soft, nontender, no hepatosplenomegaly, no distention.  Neurologic: Alert and  oriented x 3.  Psych: Normal affect. Extremities: No clubbing or cyanosis.   TELEMETRY: Reviewed telemetry pt in NSR  ASSESSMENT AND PLAN:  61 yo with history of CAD s/p anterior MI this summer treated with DES as well as ischemic cardiomyopathy and CKD.  1. CHF: Suspect acute on chronic systolic CHF with volume retention.  CXR in ER with pulmonary edema, lungs wet on exam.  JVP more difficult to assess.  Subjectively, he feels better with diuresis.  Good urine output.  Creatinine is around his baseline which seems like about 1.6.  V/Q scan was low probability.  - BP on the low side so will cut Toprol XL back to 1/2 his home dose while treated acute CHF (75 mg qd) and will decrease Bidil to 1/2 tab tid.  - Add digoxin 0.125 mg daily. - Continue current IV Lasix.   - h/o angioedema with ACEI.  2. CAD: No chest pain, presented with dyspnea.  ECG showed old inferior and old anterior MIs which is likely consistent with prior disease.  TnI peaked at 1.6.  Cardiac enzyme elevation may be due to demand ischemia.  He had 2 Promus DES to LAD this summer, if had stent thrombosis would certainly expect more marked enzyme rise.  For now, continue heparin gtt, ASA, Plavix, statin but would hold off on cath.  Will see how he feels symptomatically with diuresis.   3. CKD: Follow closely with diuresis.  Creatinine was 1.6 in 11/13.   [name] 08/25/2012 7:03 AM

## 2012-08-26 LAB — BASIC METABOLIC PANEL
BUN: 25 mg/dL — ABNORMAL HIGH (ref 6–23)
CO2: 27 mEq/L (ref 19–32)
Calcium: 9.6 mg/dL (ref 8.4–10.5)
Calcium: 9.9 mg/dL (ref 8.4–10.5)
Chloride: 95 mEq/L — ABNORMAL LOW (ref 96–112)
Creatinine, Ser: 1.73 mg/dL — ABNORMAL HIGH (ref 0.50–1.35)
Creatinine, Ser: 1.78 mg/dL — ABNORMAL HIGH (ref 0.50–1.35)
GFR calc Af Amer: 47 mL/min — ABNORMAL LOW (ref >90)

## 2012-08-26 LAB — CBC
HCT: 37.6 % — ABNORMAL LOW (ref 39.0–52.0)
MCH: 28.4 pg (ref 26.0–34.0)
MCHC: 34.3 g/dL (ref 30.0–36.0)
MCV: 82.8 fL (ref 78.0–100.0)
RDW: 15.4 % (ref 11.5–15.5)
WBC: 6.6 10*3/uL (ref 4.0–10.5)

## 2012-08-26 MED ORDER — FUROSEMIDE 40 MG PO TABS
40.0000 mg | ORAL_TABLET | Freq: Every day | ORAL | Status: DC
  Administered 2012-08-27: 40 mg via ORAL
  Filled 2012-08-26: qty 1

## 2012-08-26 MED ORDER — POTASSIUM CHLORIDE 20 MEQ PO PACK: 40.0000 meq | PACK | Freq: Once | ORAL | Status: DC

## 2012-08-26 MED ORDER — ISOSORB DINITRATE-HYDRALAZINE 20-37.5 MG PO TABS
1.0000 | ORAL_TABLET | Freq: Three times a day (TID) | ORAL | Status: DC
  Administered 2012-08-26 – 2012-08-27 (×4): 1 via ORAL
  Filled 2012-08-26 (×6): qty 1

## 2012-08-26 MED ORDER — DIGOXIN 125 MCG PO TABS: 0.1250 mg | ORAL_TABLET | Freq: Every day | ORAL | Status: DC

## 2012-08-26 MED ORDER — POTASSIUM CHLORIDE CRYS ER 20 MEQ PO TBCR
40.0000 meq | EXTENDED_RELEASE_TABLET | Freq: Once | ORAL | Status: AC
  Administered 2012-08-26: 40 meq via ORAL
  Filled 2012-08-26: qty 2

## 2012-08-26 MED ORDER — DIGOXIN 125 MCG PO TABS
0.1250 mg | ORAL_TABLET | Freq: Every day | ORAL | Status: DC
  Administered 2012-08-26 – 2012-08-27 (×2): 0.125 mg via ORAL
  Filled 2012-08-26 (×2): qty 1

## 2012-08-26 MED ORDER — FUROSEMIDE 40 MG PO TABS: 40.0000 mg | ORAL_TABLET | Freq: Every day | ORAL | Status: DC

## 2012-08-26 NOTE — Progress Notes (Signed)
ANTICOAGULATION CONSULT NOTE - Follow Up Consult  Pharmacy Consult for heparin Indication: chest pain/ACS  Labs:  Basename 08/26/12 0502 08/25/12 0843 08/25/12 0207 08/25/12 0204 08/25/12 0200 08/24/12 2011 08/24/12 1643  HGB 12.9* 12.3* -- -- -- -- --  HCT 37.6* 36.5* -- -- -- -- --  PLT 285 274 -- -- -- -- --  APTT -- -- -- -- -- -- --  LABPROT -- -- -- -- -- -- --  INR -- -- -- -- -- -- --  HEPARINUNFRC 0.20* 0.19* <0.10* -- -- -- --  CREATININE -- -- -- 1.69* -- -- --  CKTOTAL -- -- -- -- -- -- --  CKMB -- -- -- -- -- -- --  TROPONINI -- -- -- -- 1.27* 1.63* 1.45*    Assessment: 61yo male remains subtherapeutic on heparin with very little change in level despite rate increase; per RN no gtt issues.  Goal of Therapy:  Heparin level 0.3-0.7 units/ml   Plan:  Will increase heparin gtt by 2-3 units/kg/hr to 2000 units/hr and check level in 6-8hr.  Colleen Can PharmD BCPS 08/26/2012,6:27 AM

## 2012-08-26 NOTE — Progress Notes (Signed)
Patient ID: Adam House, male   DOB: 03-31-1951, 61 y.o.   MRN: [id number]     SUBJECTIVE: Doing much better.  He has walked laps around the unit and dyspnea is gone.  Weight is down 11 lbs.  He has not had chest pain. TnI peaked at 1.63.      [name] allopurinol  100 mg Oral Daily  . [EXPIRED] aspirin  324 mg Oral Pre-Cath  . aspirin EC  81 mg Oral Daily  . atorvastatin  80 mg Oral q1800  . clopidogrel  75 mg Oral Q breakfast  . digoxin  0.125 mg Oral Daily  . furosemide  40 mg Oral Daily  . isosorbide-hydrALAZINE  1 tablet Oral TID  . metoprolol succinate  75 mg Oral Daily  . pantoprazole  40 mg Oral Q1200  . potassium chloride  40 mEq Oral Once  . sodium chloride  3 mL Intravenous Q12H  . sodium chloride  3 mL Intravenous Q12H  . spironolactone  25 mg Oral Daily  . [DISCONTINUED] furosemide  40 mg Intravenous Q8H  . [DISCONTINUED] isosorbide-hydrALAZINE  0.5 tablet Oral TID  . [DISCONTINUED] potassium chloride  40 mEq Oral Once      Filed Vitals:   08/25/12 2211 08/26/12 0000 08/26/12 0400 08/26/12 0500  BP: 109/73 107/67 110/71   Pulse:  88 98   Temp:  97.9 F (36.6 C) 97.4 F (36.3 C)   TempSrc:  Oral Oral   Resp:  16 18   Height:      Weight:    200 lb 2.8 oz (90.8 kg)  SpO2:  95% 94%     Intake/Output Summary (Last 24 hours) at 08/26/12 0727 Last data filed at 08/26/12 0700  Gross per 24 hour  Intake 1180.5 ml  Output   3300 ml  Net -2119.5 ml    LABS: Basic Metabolic Panel:  Basename 08/26/12 0502 08/25/12 0204  NA 136 139  K 3.5 3.8  CL 95* 103  CO2 27 23  GLUCOSE 106* 97  BUN 25* 15  CREATININE 1.73* 1.69*  CALCIUM 9.6 9.1  MG -- 1.7  PHOS -- --   Liver Function Tests: No results found for this basename: AST:2,ALT:2,ALKPHOS:2,BILITOT:2,PROT:2,ALBUMIN:2 in the last 72 hours No results found for this basename: LIPASE:2,AMYLASE:2 in the last 72 hours CBC:  Basename 08/26/12 0502 08/25/12 0843  WBC 6.6 8.7  NEUTROABS -- --  HGB 12.9*  12.3*  HCT 37.6* 36.5*  MCV 82.8 83.9  PLT 285 274   Cardiac Enzymes:  Basename 08/25/12 0200 08/24/12 2011 08/24/12 1643  CKTOTAL -- -- --  CKMB -- -- --  CKMBINDEX -- -- --  TROPONINI 1.27* 1.63* 1.45*   BNP: No components found with this basename: POCBNP:3 D-Dimer: No results found for this basename: DDIMER:2 in the last 72 hours Hemoglobin A1C: No results found for this basename: HGBA1C in the last 72 hours Fasting Lipid Panel:  Basename 08/25/12 0204  CHOL 148  HDL 23*  LDLCALC 103*  TRIG 112  CHOLHDL 6.4  LDLDIRECT --   Thyroid Function Tests: No results found for this basename: TSH,T4TOTAL,FREET3,T3FREE,THYROIDAB in the last 72 hours Anemia Panel: No results found for this basename: VITAMINB12,FOLATE,FERRITIN,TIBC,IRON,RETICCTPCT in the last 72 hours  RADIOLOGY: Nm Pulmonary Perf And Vent  08/24/2012  *RADIOLOGY REPORT*  Clinical Data: Shortness of breath, elevated D-dimer, chronic renal insufficiency.  Companion chest radiograph shows bilateral alveolar pulmonary edema.  VENTILATION PERFUSION SCINTIGRAPHY  Findings: Ventilation imaging with Xe-133 inhaled shows  homogeneous distribution throughout both lungs with no focal retention on the washout images. Perfusion imaging with 3. Tc46m MAA IV shows a defect from the overlying left subclavian AICD device.  There is a somewhat heterogeneous distribution throughout the lungs with no discrete segmental or subsegmental perfusion defects.  IMPRESSION 1.  Low likelihood ratio for pulmonary embolism.   Original Report Authenticated By: D. Andria Rhein, MD     PHYSICAL EXAM General: NAD Neck: JVP not elevated, no thyromegaly or thyroid nodule.  Lungs: Slight crackles at bases bilaterally CV: Nondisplaced PMI.  Heart regular S1/S2, no S3/S4, no murmur.  No peripheral edema.  No carotid bruit.  Normal pedal pulses.  Abdomen: Soft, nontender, no hepatosplenomegaly, no distention.  Neurologic: Alert and oriented x 3.    Psych: Normal affect. Extremities: No clubbing or cyanosis.   TELEMETRY: Reviewed telemetry pt in NSR  ASSESSMENT AND PLAN:  61 yo with history of CAD s/p anterior MI this summer treated with DES as well as ischemic cardiomyopathy and CKD.  1. CHF: Suspect acute on chronic systolic CHF with volume retention.  CXR in ER with pulmonary edema, lungs wet on exam.  Subjectively, he feels much better with diuresis and weight is down 11 lbs.  He feels like he had gradually gained about 10 lbs at home.  Good urine output.  V/Q scan was low probability.  - Increase Bidil back to 1 tab tid and continue current Toprol XL dose. Will increase Toprol XL back to home dose tomorrow if BP stable.   - Add digoxin 0.125 mg daily. - Change Lasix to 40 mg po daily.    - h/o angioedema with ACEI.  2. CAD: No chest pain, presented with dyspnea/weight gain.  Different than symptoms with MI this summer.  ECG showed old inferior and old anterior MIs which is likely consistent with prior disease.  TnI peaked at 1.6.  I think that cardiac enzyme elevation may be due to demand ischemia.  He had 2 Promus DES to LAD this summer, if had stent thrombosis would certainly expect more marked enzyme rise.  Continue ASA, Plavix, statin but would hold off on cath.  Much better symptomatically with diuresis.  Can stop IV heparin.  3. CKD: Creatinine up to 1.7, changing Lasix over to po.  4. Transfer to floor, ambulate, likely home in am.   Marca House 08/26/2012 7:27 AM

## 2012-08-26 NOTE — Progress Notes (Signed)
Report called to 4700 RN. Pt and family aware of new room assignment. Pt denies SOB, CP, or any other complaints.

## 2012-08-26 NOTE — Care Management Note (Signed)
    Page 1 of 1   08/26/2012     11:05:26 AM   CARE MANAGEMENT NOTE 08/26/2012  Patient:  GAREN, WOOLBRIGHT   Account Number:  [id number]  Date Initiated:  08/24/2012  Documentation initiated by:  Junius Creamer  Subjective/Objective Assessment:   adm w mi     Action/Plan:   lives w wife, pcp dr Cristine Polio f[place]   Anticipated DC Date:     Anticipated DC Plan:  HOME/SELF CARE      DC Planning Services  CM consult      Choice offered to / List presented to:             Status of service:   Medicare Important Message given?   (If response is "NO", the following Medicare IM given date fields will be blank) Date Medicare IM given:   Date Additional Medicare IM given:    Discharge Disposition:  HOME/SELF CARE  Per UR Regulation:  Reviewed for med. necessity/level of care/duration of stay  If discussed at Long Length of Stay Meetings, dates discussed:    Comments:  12/12 11:00 a debbie Haisley Arens rn,bsn pt act w outpt card rehab. he has visitis. pt not homebound and loves his card rehab program. he will follow up w card rehab and md in office.  12/10 15:15P DEBBIE Dayleen Beske RN,BNS [phone]

## 2012-08-27 ENCOUNTER — Encounter (HOSPITAL_COMMUNITY): Payer: Self-pay | Admitting: Physician Assistant

## 2012-08-27 LAB — CBC
MCH: 28.1 pg (ref 26.0–34.0)
MCV: 80.3 fL (ref 78.0–100.0)
Platelets: 337 10*3/uL (ref 150–400)
RDW: 14.9 % (ref 11.5–15.5)
WBC: 7.4 10*3/uL (ref 4.0–10.5)

## 2012-08-27 LAB — BASIC METABOLIC PANEL
Calcium: 10 mg/dL (ref 8.4–10.5)
Creatinine, Ser: 1.63 mg/dL — ABNORMAL HIGH (ref 0.50–1.35)
GFR calc Af Amer: 51 mL/min — ABNORMAL LOW (ref >90)

## 2012-08-27 MED ORDER — ATORVASTATIN CALCIUM 80 MG PO TABS: 80.0000 mg | ORAL_TABLET | Freq: Every evening | ORAL | Status: DC

## 2012-08-27 MED ORDER — FUROSEMIDE 40 MG PO TABS: 40.0000 mg | ORAL_TABLET | Freq: Every day | ORAL | Status: DC

## 2012-08-27 MED ORDER — NITROGLYCERIN 0.4 MG SL SUBL: 0.4000 mg | SUBLINGUAL_TABLET | SUBLINGUAL | Status: DC | PRN

## 2012-08-27 MED ORDER — METOPROLOL SUCCINATE ER 50 MG PO TB24
150.0000 mg | ORAL_TABLET | Freq: Every day | ORAL | Status: DC
  Administered 2012-08-27: 150 mg via ORAL
  Filled 2012-08-27 (×2): qty 1

## 2012-08-27 MED ORDER — DIGOXIN 125 MCG PO TABS: 0.1250 mg | ORAL_TABLET | Freq: Every day | ORAL | Status: DC

## 2012-08-27 MED ORDER — SILDENAFIL CITRATE 50 MG PO TABS: 25.0000 mg | ORAL_TABLET | Freq: Every day | ORAL | Status: DC | PRN

## 2012-08-27 NOTE — Progress Notes (Signed)
Patient is being discharged home. Lorretta Harp RN

## 2012-08-27 NOTE — Progress Notes (Signed)
Patient ID: Adam House, male   DOB: 11-Jan-1951, 61 y.o.   MRN: [id number]    SUBJECTIVE: No dyspnea, feels well.  Weight is down 13 lbs.  He has not had chest pain. TnI peaked at 1.63. Creatinine trending back down.      . allopurinol  100 mg Oral Daily  . aspirin EC  81 mg Oral Daily  . atorvastatin  80 mg Oral q1800  . clopidogrel  75 mg Oral Q breakfast  . digoxin  0.125 mg Oral Daily  . furosemide  40 mg Oral Daily  . isosorbide-hydrALAZINE  1 tablet Oral TID  . metoprolol succinate  150 mg Oral Daily  . pantoprazole  40 mg Oral Q1200  . spironolactone  25 mg Oral Daily      Filed Vitals:   08/26/12 1722 08/26/12 2020 08/27/12 0146 08/27/12 0631  BP: 125/77 106/65 103/47 105/53  Pulse: 91 91 83 80  Temp:  97.8 F (36.6 C) 98.1 F (36.7 C) 97.5 F (36.4 C)  TempSrc:  Oral Oral Oral  Resp: 18 20 18 18   Height:      Weight:    198 lb 11.2 oz (90.13 kg)  SpO2: 100% 100% 97% 97%    Intake/Output Summary (Last 24 hours) at 08/27/12 0726 Last data filed at 08/27/12 4098  Gross per 24 hour  Intake    600 ml  Output    150 ml  Net    450 ml    LABS: Basic Metabolic Panel:  Basename 08/27/12 0558 08/26/12 1613 08/25/12 0204  NA 136 137 --  K 4.2 3.7 --  CL 98 96 --  CO2 24 27 --  GLUCOSE 111* 96 --  BUN 27* 26* --  CREATININE 1.63* 1.78* --  CALCIUM 10.0 9.9 --  MG -- -- 1.7  PHOS -- -- --   Liver Function Tests: No results found for this basename: AST:2,ALT:2,ALKPHOS:2,BILITOT:2,PROT:2,ALBUMIN:2 in the last 72 hours No results found for this basename: LIPASE:2,AMYLASE:2 in the last 72 hours CBC:  Basename 08/27/12 0558 08/26/12 0502  WBC 7.4 6.6  NEUTROABS -- --  HGB 13.7 12.9*  HCT 39.1 37.6*  MCV 80.3 82.8  PLT 337 285   Cardiac Enzymes:  Basename 08/25/12 0200 08/24/12 2011 08/24/12 1643  CKTOTAL -- -- --  CKMB -- -- --  CKMBINDEX -- -- --  TROPONINI 1.27* 1.63* 1.45*   BNP: No components found with this basename:  POCBNP:3 D-Dimer: No results found for this basename: DDIMER:2 in the last 72 hours Hemoglobin A1C: No results found for this basename: HGBA1C in the last 72 hours Fasting Lipid Panel:  Basename 08/25/12 0204  CHOL 148  HDL 23*  LDLCALC 103*  TRIG 112  CHOLHDL 6.4  LDLDIRECT --   Thyroid Function Tests: No results found for this basename: TSH,T4TOTAL,FREET3,T3FREE,THYROIDAB in the last 72 hours Anemia Panel: No results found for this basename: VITAMINB12,FOLATE,FERRITIN,TIBC,IRON,RETICCTPCT in the last 72 hours  RADIOLOGY: Nm Pulmonary Perf And Vent  08/24/2012  *RADIOLOGY REPORT*  Clinical Data: Shortness of breath, elevated D-dimer, chronic renal insufficiency.  Companion chest radiograph shows bilateral alveolar pulmonary edema.  VENTILATION PERFUSION SCINTIGRAPHY  Findings: Ventilation imaging with Xe-133 inhaled shows homogeneous distribution throughout both lungs with no focal retention on the washout images. Perfusion imaging with 3. Tc64m MAA IV shows a defect from the overlying left subclavian AICD device.  There is a somewhat heterogeneous distribution throughout the lungs with no discrete segmental or subsegmental perfusion defects.  IMPRESSION  1.  Low likelihood ratio for pulmonary embolism.   Original Report Authenticated By: D. Andria Rhein, MD     PHYSICAL EXAM General: NAD Neck: JVP not elevated, no thyromegaly or thyroid nodule.  Lungs: Slight crackles at bases bilaterally CV: Nondisplaced PMI.  Heart regular S1/S2, no S3/S4, no murmur.  No peripheral edema.  No carotid bruit.  Normal pedal pulses.  Abdomen: Soft, nontender, no hepatosplenomegaly, no distention.  Neurologic: Alert and oriented x 3.  Psych: Normal affect. Extremities: No clubbing or cyanosis.   TELEMETRY: Reviewed telemetry pt in NSR  ASSESSMENT AND PLAN:  61 yo with history of CAD s/p anterior MI this summer treated with DES as well as ischemic cardiomyopathy and CKD.  1. CHF:  Suspect acute on chronic systolic CHF with volume retention.  CXR in ER with pulmonary edema, lungs wet on exam.  Subjectively, he feels much better with diuresis and weight is down 13 lbs.  He feels like he had gradually gained about 10 lbs at home.  Good urine output.  V/Q scan was low probability.  - Stable for discharge today.  Will increase his Toprol XL back to home dose 150 mg daily.   - h/o angioedema with ACEI.  2. CAD: No chest pain, presented with dyspnea/weight gain.  Different than symptoms with MI this summer.  ECG showed old inferior and old anterior MIs which is likely consistent with prior disease.  TnI peaked at 1.6.  I think that cardiac enzyme elevation may be due to demand ischemia.  He had 2 Promus DES to LAD this summer, if had stent thrombosis would certainly expect more marked enzyme rise.  Continue ASA, Plavix, statin but would hold off on cath.  Much better symptomatically with diuresis.  .  3. CKD: Creatinine down to 1.6.  4. Home today.  Can followup with Adam House in [place] in 10 days or with me in [place].  Needs BMET/BNP/digoxin level at [facility] office in 1 week.  Meds for home: digoxin 0.125 mg daily, Lasix 40 mg po daily, Bidil 1 tab tid, Toprol XL 150 mg daily, spironolactone 25 mg daily, atorvastatin 80, ASA 81, Plavix 75.   [name] 08/27/2012 7:26 AM

## 2012-08-27 NOTE — Progress Notes (Signed)
Patient does not want keep telemetry or IV in; patient is upset because discharge papers are not ready; MD and PA made aware and discharge papers will be available in 1-2 hours because of other patient emergencies; patient made aware and is not happy and does not want to keep telemetry on; will continue to monitor patient.  Lorretta Harp RN

## 2012-08-27 NOTE — Discharge Summary (Signed)
Discharge Summary   Patient ID: Adam House MRN: [id number], DOB/AGE: 61-Jul-1952 61 y.o. Admit date: 08/24/2012 D/C date:     08/27/2012  Primary Cardiologist: Shirlee Latch  Primary Discharge Diagnoses:  1. Acute on chronic systolic CHF - digoxin added - h/o angioedema with ACEI - maintained on BiDil - ICM by echo 04/2012 -EF 25-30%, grade II diastolic dysfunction, apical akinesis, mildly dilated RV and moderately dilated LV, no significant valvular abnormalities, history of [employer] ICD 2. CAD  - NSTEMI this admission felt secondary to demand ischemia in setting of #1 - history: Anterior MI in 1998 with PCI to LAD, anterolateral MI in 8/13 with 100% mLAD, 70% dLAD tx with overlapping Promus DES  3. Chronic kidney disease stage III  Secondary Discharge Diagnoses:  1. 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 GERD  3. Post-MI pericarditis in 8/13 4. Hyperlipidemia  5. Tobacco abuse 6. Gout   Hospital Course: 61 yo with history of CAD with ant-lat MI 04/2012 treated with overlapping Promus stents to LAD, ICM EF 25-30%, post-MI pericarditis, and h/o CVA. He presented to [facility] with 3-day history of significant exertional dyspnea with minimal exertion, persistent cough and PND. He denied any exertional chest discomfort, orthopnea, but did note LL chest discomfort with inspiration. He also had had weight gain. He denied any ICD firing or tachy-palpitations. Admit labs showed elevated troponin of 1.6 and CXR suggestion of pulm edema on CXR. His symptoms were felt concerning for NSTEMI/CHF so he was transferred to [facility] for evaluation. He was placed on heparin. VQ scan showed low likelihood ratio for pulmonary embolism. He was diuresed with IV Lasix and medications were adjusted including addition of digoxin. Troponin peaked at 1.63 here. Since his symptoms improved with diuresis and this was a different presentation than his prior MI, his NSTEMI was felt secondary to demand ischemia in the  setting of heart failure. One would expect a more marked enzyme rise if he had stent thrombosis. Thus cath was not undertaken. He was able to ambulate without symptoms. Pulse ox today is 99% on RA. Dr. Shirlee Latch has seen/examined him today and feels he is stable for discharge. The schedulers have been made aware he will be a transition of care patient. He will f/u in [place] next Friday 12/20 with plans for BMET/BNP/digoxin level just prior to this appointment.  Discharge Vitals: Blood pressure 113/71, pulse 88, temperature 97.8 F (36.6 C), temperature source Oral, resp. rate 18, height 5' 9.5" (1.765 m), weight 198 lb 11.2 oz (90.13 kg), SpO2 99.00%.  Labs: Lab Results  Component Value Date   WBC 7.4 08/27/2012   HGB 13.7 08/27/2012   HCT 39.1 08/27/2012   MCV 80.3 08/27/2012   PLT 337 08/27/2012     Lab 08/27/12 0558  NA 136  K 4.2  CL 98  CO2 24  BUN 27*  CREATININE 1.63*  CALCIUM 10.0  PROT --  BILITOT --  ALKPHOS --  ALT --  AST --  GLUCOSE 111*    Basename 08/25/12 0200 08/24/12 2011 08/24/12 1643  CKTOTAL -- -- --  CKMB -- -- --  TROPONINI 1.27* 1.63* 1.45*   Lab Results  Component Value Date   CHOL 148 08/25/2012   HDL 23* 08/25/2012   LDLCALC 103* 08/25/2012   TRIG 112 08/25/2012   D-dimer 2.31  Diagnostic Studies/Procedures   Nm Pulmonary Perf And Vent 08/24/2012  *RADIOLOGY REPORT*  Clinical Data: Shortness of breath, elevated D-dimer, chronic renal insufficiency.  Companion chest  radiograph shows bilateral alveolar pulmonary edema.  VENTILATION PERFUSION SCINTIGRAPHY  Findings: Ventilation imaging with Xe-133 inhaled shows homogeneous distribution throughout both lungs with no focal retention on the washout images. Perfusion imaging with 3. Tc76m MAA IV shows a defect from the overlying left subclavian AICD device.  There is a somewhat heterogeneous distribution throughout the lungs with no discrete segmental or subsegmental perfusion defects.   IMPRESSION 1.  Low likelihood ratio for pulmonary embolism.   Original Report Authenticated By: D. Andria Rhein, MD    12/10 [facility] CXR: overall suggestive of alveolar pulm edema, similar to 04/2012 exam.  Discharge Medications   Current Discharge Medication List    START taking these medications   Details  atorvastatin (LIPITOR) 80 MG tablet Take 1 tablet (80 mg total) by mouth every evening. Qty: 30 tablet, Refills: 3   Associated Diagnoses: NSTEMI (non-ST elevated myocardial infarction)    digoxin (LANOXIN) 0.125 MG tablet Take 1 tablet (0.125 mg total) by mouth daily. Qty: 30 tablet, Refills: 3      CONTINUE these medications which have CHANGED   Details  furosemide (LASIX) 40 MG tablet Take 1 tablet (40 mg total) by mouth daily. Qty: 30 tablet, Refills: 3    nitroGLYCERIN (NITROSTAT) 0.4 MG SL tablet Place 1 tablet (0.4 mg total) under the tongue every 5 (five) minutes as needed for chest pain (up to 3 doses).      CONTINUE these medications which have NOT CHANGED   Details  albuterol (PROVENTIL HFA;VENTOLIN HFA) 108 (90 BASE) MCG/ACT inhaler Inhale 2 puffs into the lungs every 6 (six) hours as needed. For shortness of breath    allopurinol (ZYLOPRIM) 100 MG tablet Take 100 mg by mouth daily.    aspirin EC 81 MG tablet Take 1 tablet (81 mg total) by mouth daily.    clopidogrel (PLAVIX) 75 MG tablet Take 1 tablet (75 mg total) by mouth daily with breakfast.     colchicine 0.6 MG tablet Take 1 tablet (0.6 mg total) by mouth daily as needed. For gout     fish oil-omega-3 fatty acids 1000 MG capsule Take 2 g by mouth daily.    isosorbide-hydrALAZINE (BIDIL) 20-37.5 MG per tablet Take 1 tablet by mouth 3 (three) times daily. Dose increased 08/11/2012.     metoprolol succinate (TOPROL-XL) 100 MG 24 hr tablet Take 1 1/2 tabs (150mg ) by mouth daily    Comments: Dose increased 05/21/2012.    pantoprazole (PROTONIX) 40 MG tablet Take 1 tablet (40 mg total) by mouth daily.      Associated Diagnoses: Tobacco use disorder; Essential hypertension, benign; Other specified forms of chronic ischemic heart disease; Coronary atherosclerosis of unspecified type of vessel, native or graft    spironolactone (ALDACTONE) 25 MG tablet Take 25 mg by mouth daily.      STOP taking these medications     Aspirin-Acetaminophen-Caffeine (EXCEDRIN EXTRA STRENGTH PO) Comments:  Reason for Stopping:       rosuvastatin (CRESTOR) 40 MG tablet Comments:  Reason for Stopping: changed to lipitor      sildenafil (VIAGRA) 50 MG tablet Comments:  Reason for Stopping: contraindicated with nitrates         Disposition   The patient will be discharged in stable condition to home. Discharge Orders    Future Appointments: Provider: Department: Dept Phone: Center:   09/03/2012 2:20 PM Prescott Parma, PA [address] [place] [phone] [facility]   09/23/2012 8:20 AM [facility] ([place])  [phone] [facility]   11/01/2012 8:30 AM Laurey Morale, MD [phone] [facility]     Future Orders Please Complete By Expires   Diet - low sodium heart healthy      Increase activity slowly      Comments:   For patients with heart failure, we give them these special instructions:  1. Follow a low-salt diet and watch your fluid intake. In general, you should not be taking in more than 2 liters of fluid per day. Some patients are restricted to less than 1.5 liters. This includes sources of water in foods like soup. 2. Weigh yourself on the same scale at same time of day and keep a log. 3. Call your doctor: (Anytime you feel any of the following symptoms)  - 3-4 pound weight gain in 1-2 days or 2 pounds overnight  - Shortness of breath, with or without a dry hacking cough  - Swelling in the hands, feet or stomach  - If you have to sleep on extra pillows at night in order to breathe   Discharge  instructions      Comments:   Your Viagra was discontinued because you are currently taking nitrates (Bidil) for your heart failure (Viagra can cause an unsafe drop in blood pressure).     Follow-up Information    Follow up with [facility], MD. (As scheduled)    Contact information:   [address] [place] [address] [phone]       Follow up with Labwork at [facility]. (Please take lab slip to the lab at [facility] on 09/02/12 to have bloodwork.)       Follow up with Prescott Parma, PA. (09/03/12 at 2:20pm)    Contact information:   [facility] [address] [place], [address] [place] [address] [phone]            Duration of Discharge Encounter: Greater than 30 minutes including physician and PA time.  Signed, Ronie Spies PA-C 08/27/2012, 12:20 PM

## 2012-08-27 NOTE — Progress Notes (Signed)
Pt had 5 beats of vtach. Pt asymptomatic . Dr Shirlee Latch made aware no orders received. Continued to monitor pt closely.

## 2012-08-30 NOTE — H&P (Signed)
NAME:  Adam House, Adam House ROOM:   UNIT NUMBER:  [id number] LOCATION: ER ADM/VISIT DATE:  08/24/2012   ADM Adam House:  [id number] DOB: 1951/06/30   PRIMARY CARDIOLOGY:  [name], M.D.  PRIMARY PHYSICIAN:  Avon Gully, M.D.  REFERRING PHYSICIAN:  Su Monks, M.D. [facility] ED.  REASON FOR CONSULTATION:  Adam House is a 61 year old male, well known to [place], with ischemic cardiomyopathy status post anterolateral MI in August of this year, treated with overlapping drug-eluting stents of the LAD.  Hospital course complicated by post MI pericarditis as well as acute/chronic CHF.  Ejection fraction assessed at 25/30%, by most recent echocardiogram.  He was most recently seen in follow up here in our [facility], by Dr. Shirlee Latch, on November 27.  At that time, medications were adjusted with up titration of Bidil to 1 whole tablet t.i.d., and increasing Crestor to 40 mg daily.  The patient presented to the ED earlier this morning with complaint of a 3 day history of significant exertional dyspnea with minimal exertion, persistent cough, as well as PND.  Of note, however, he denies any exertional chest discomfort or orthopnea, but did note some left lower chest discomfort with inspiration.  The patient also denied any firing of his defibrillator or tachy-palpitations.  Admission EKG indicated no significant change since previous study in August.  Laboratory data, however, notable for elevated CPK/MB and troponin of 1.6.  BNP is mildly elevated at 340, and there was evidence of mild pulmonary edema on chest x-ray.  ALLERGIES:  ACE INHIBITORS (angioedema).  HOME MEDICATIONS: 1. Proventil 2 puffs q. 6 hours p.r.n. 2. Aldactone 25 mg daily. 3. Allopurinol 100 mg daily. 4. Aspirin 81 mg daily. 5. Plavix 75 mg daily. 6. Colchicine 0.6 mg p.r.n. 7. Omega-3 fish oil 2 grams daily. 8. Metoprolol XL 150 mg daily. 9. Protonix 40 mg daily. 10. Crestor 40 mg daily. 11. Bidil 20/37.5  t.i.d. 12. Nitrostat 0.4 mg p.r.n. 13. Viagra 25 mg p.r.n.  PAST MEDICAL HISTORY: 1. Ischemic cardiomyopathy. a. Status post-acute anterolateral MI, 08/13, treated with DES LAD b. Status post anterior MI/PCI LAD, 1998. c. Post MI pericarditis, 04/2012. d. EF 25-30%, grade 2 diastolic dysfunction, apical AK, 2D echocardiogram, 04/2012. e. Status post ICD implantation [facility]). 2. History of angioedema (ACE inhibitor). 3. Status post stroke, 2012. 4. CKD. 5. HLD. 6. GERD. 7. Tobacco.  SOCIAL HISTORY: The patient is married, retired and on total disability.  He quit smoking 4 days ago, but was previously smoking half a pack a day.  Denies alcohol use.  FAMILY HISTORY:  Noncontributory.  REVIEW OF SYSTEMS:  As outlined per HPI.  The remaining systems reviewed are negative.  PHYSICAL EXAMINATION:  Vital signs:  Blood pressure currently 115/72, pulse 90, regular, temperature afebrile, sats 95% on room air, weight 208 pounds.  General:  A 61 year old male lying supine in no distress.  HEENT:  Normocephalic and atraumatic.  PERRLA.  EOMI.  Neck:  Palpable bilateral carotid pulses with left-sided bruit; dilated right external jugular vein.  Lungs:  Bibasilar crackles, no wheezes.  Heart:  Regular rate and rhythm.  No significant murmurs.  No rubs or gallops.  Abdomen:  Soft, intact bowel sounds.  Extremities:  Palpable bilateral femoral pulses without bruits; no peripheral edema, intact distal pulses.  Skin: Warm and dry.  Musculoskeletal:  No deformity.  Neurological:  No focal deficits.  LABORATORY AND RADIOLOGICAL DATA:  Admission chest x-ray:  Pulse edema.  Admission EKG:  Normal sinus  rhythm at 90 BPM with first degree AV block; no acute changes with persistent slight J point elevation in the anterolateral leads.  Laboratory data:  CPK 275/10, troponin 1.7.  INR 1.1.  D-dimer 2.3.  BNP 340.  Sodium 134, potassium 3.3, BUN 10, creatinine 1.3 (GFR greater than 60), glucose 124.   WBC 10,500, hemoglobin 12, hematocrit 37 and platelets 272,000.  IMPRESSION: 1. Non-ST elevation myocardial infarction. 2. Ischemic cardiomyopathy. a. Ejection fraction 25-30%. b. Status post-acute anterolateral myocardial infarction/drug-eluting stents left anterior descending, 04/2012. c. Acute/chronic SHF. 3. Chronic kidney disease. 4. Hypokalemia. 5. Left carotid bruits.  PLAN:  Recommendations are to transfer the patient directly to [facility] for continued close monitoring and management.  The plan is to then proceed with cardiac catheterization either later today, if troponins trend upward or in the morning.  In the meanwhile, the patient will be treated with IV heparin, which has been started here in the ED, in addition to aspirin, Plavix, beta blockers and statin.  Regarding his beta blocker therapy, we will decrease Toprol XL to 100 mg daily, in light of low normal blood pressure.  The patient will also be treated aggressively with IV Lasix at 40 mg IV q. 8 hours, with close monitoring of volume status with strict I/O, daily weights.  We will check follow up labs in the a.m., and replete mild hypokalemia prior to transfer.  Also, the patient presents with elevated D-dimer and we will further evaluate with a ventilation/perfusion scan at [facility].  In summary, the patient's salient complaint is that of sudden onset dyspnea over the past 3 days, with no associated chest pain.  Although the troponin is significantly elevated, and the patient has known underlying coronary artery disease, his presentation is most consistent with mild congestive heart failure.  This is noted by chest x-ray, mildly elevated BNP level, although it was difficult to assess jugular venous distension.  The patient was seen and examined in conjunction with Dr. Shirlee Latch.  An extensive review of the patients most recent office records was done, as well.   __________________________    Adam House,  P.A. Adam House D: 08/24/2012 1136 T: 08/24/2012 1235 P: Jefm Petty

## 2012-09-03 ENCOUNTER — Ambulatory Visit (INDEPENDENT_AMBULATORY_CARE_PROVIDER_SITE_OTHER): Payer: PRIVATE HEALTH INSURANCE | Admitting: Physician Assistant

## 2012-09-03 ENCOUNTER — Encounter: Payer: Self-pay | Admitting: Physician Assistant

## 2012-09-03 VITALS — BP 142/85 | HR 92 | Ht 69.0 in | Wt 207.1 lb

## 2012-09-03 DIAGNOSIS — E785 Hyperlipidemia, unspecified: Secondary | ICD-10-CM

## 2012-09-03 DIAGNOSIS — N182 Chronic kidney disease, stage 2 (mild): Secondary | ICD-10-CM

## 2012-09-03 DIAGNOSIS — Z720 Tobacco use: Secondary | ICD-10-CM | POA: Insufficient documentation

## 2012-09-03 DIAGNOSIS — I5022 Chronic systolic (congestive) heart failure: Secondary | ICD-10-CM

## 2012-09-03 DIAGNOSIS — R0989 Other specified symptoms and signs involving the circulatory and respiratory systems: Secondary | ICD-10-CM | POA: Insufficient documentation

## 2012-09-03 DIAGNOSIS — I509 Heart failure, unspecified: Secondary | ICD-10-CM

## 2012-09-03 DIAGNOSIS — I2589 Other forms of chronic ischemic heart disease: Secondary | ICD-10-CM

## 2012-09-03 DIAGNOSIS — F17200 Nicotine dependence, unspecified, uncomplicated: Secondary | ICD-10-CM

## 2012-09-03 MED ORDER — FUROSEMIDE 40 MG PO TABS: 40.0000 mg | ORAL_TABLET | Freq: Two times a day (BID) | ORAL | Status: DC

## 2012-09-03 NOTE — Patient Instructions (Signed)
   Increase Lasix to 40mg  twice a day (new sent to pharm)  Carotid Doppler  Labs - BMET, BNP - due in 5 days  Office will contact with results Continue all other current medications. Follow up in  1 month

## 2012-09-03 NOTE — Assessment & Plan Note (Signed)
Has cut back significantly to current level of 3-4 cigarettes daily, and appears motivated to stop completely on his own.

## 2012-09-03 NOTE — Assessment & Plan Note (Addendum)
Recent post hospital labs showed stable renal function with creatinine 1.5. We'll continue to closely monitor with followup labs in the next several days, particularly in light of recent addition of digoxin.

## 2012-09-03 NOTE — Assessment & Plan Note (Signed)
Quiescent on current medication regimen, which includes Plavix, status post recent NST EMI type II, secondary to demand ischemia in setting of A/C SHF.

## 2012-09-03 NOTE — Progress Notes (Signed)
Primary Cardiologist: [name], MD   HPI: Post hospital followup from [facility] (12/10-12/13/2013), following direct transfer from [facility] ED to [facility] for further evaluation and management of A/C SHF and NSTEMI.  Initial troponin 1.7, with subsequent downward trending at [facility]. Patient's clinical presentation was most consistent with CHF, and his abnormal troponins were attributed to demand ischemia. He was treated medically, and digoxin was added to his previous regimen. VQ scan low probability for PE. Discharge creatinine level I.6. Post hospital followup labs, as follows:   - BUN 17, creatinine 1.5, potassium 4.0. Digoxin < 0.2. BNP 190  Clinically, he reports significant improvement since his recent hospitalization. He has been weighing himself daily, noting an increase of perhaps 2 pounds since his release. He denies orthopnea, PND, and did not have any peripheral edema when he initially presented. He also denies any CP or firing of his ICD.  Allergies  Allergen Reactions  . Lisinopril Anaphylaxis and Swelling    angioedema  . Naproxen Hives    Current Outpatient Prescriptions  Medication Sig Dispense Refill  . albuterol (PROVENTIL HFA;VENTOLIN HFA) 108 (90 BASE) MCG/ACT inhaler Inhale 2 puffs into the lungs every 6 (six) hours as needed. For shortness of breath      . allopurinol (ZYLOPRIM) 100 MG tablet Take 100 mg by mouth daily.      [name] aspirin EC 81 MG tablet Take 1 tablet (81 mg total) by mouth daily.      [name] atorvastatin (LIPITOR) 80 MG tablet Take 1 tablet (80 mg total) by mouth every evening.  30 tablet  3  . clopidogrel (PLAVIX) 75 MG tablet Take 1 tablet (75 mg total) by mouth daily with breakfast.  30 tablet  11  . colchicine 0.6 MG tablet Take 1 tablet (0.6 mg total) by mouth daily as needed. For gout  30 tablet  11  . digoxin (LANOXIN) 0.125 MG tablet Take 1 tablet (0.125 mg total) by mouth daily.  30 tablet  3  . fish oil-omega-3 fatty acids 1000 MG capsule Take 2 g by mouth  daily.      . furosemide (LASIX) 40 MG tablet Take 1 tablet (40 mg total) by mouth daily.  30 tablet  3  . isosorbide-hydrALAZINE (BIDIL) 20-37.5 MG per tablet Take 1 tablet by mouth 3 (three) times daily. Dose increased 08/11/2012.  90 tablet  6  . metoprolol succinate (TOPROL-XL) 100 MG 24 hr tablet Take 1 1/2 tabs (150mg ) by mouth daily  45 tablet  6  . nitroGLYCERIN (NITROSTAT) 0.4 MG SL tablet Place 1 tablet (0.4 mg total) under the tongue every 5 (five) minutes as needed for chest pain (up to 3 doses).      . pantoprazole (PROTONIX) 40 MG tablet Take 1 tablet (40 mg total) by mouth daily.  90 tablet  3  . spironolactone (ALDACTONE) 25 MG tablet Take 25 mg by mouth daily.        Past Medical History  Diagnosis Date  . CAD (coronary artery disease)     a. ant MI 1998 with PCI to LAD. b. Ant-lat MI 04/2012 tx with overlapping DES to LAD.  [name] Systolic CHF     NYHA Class II/III  . Ischemic cardiomyopathy     EF 25-30% by echo 04/2012; [employer] ICD  . CRI (chronic renal insufficiency)   . Unspecified essential hypertension   . Impotence of organic origin   . Esophageal reflux   . Tobacco use disorder   . ICD (implantable  cardiac defibrillator) in place   . Hypercholesteremia   . Heart murmur   . CVA (cerebral infarction) 11/2010  . Gout   . Post-MI pericarditis     04/2012    Past Surgical History  Procedure Date  . Anterior cervical decomp/discectomy fusion 2007    "put in 4 screws to hold my head on; ruptured C5; job related injury" (08/24/2012)  . Cardiac defibrillator placement 2011    [employer]  he is a MADIT RIT study patient  . Coronary angioplasty with stent placement 05/1997; 04/2012    "1 + 2; total of 3" (08/24/2012)    History   Social History  . Marital Status: Married    Spouse Name: N/A    Number of Children: N/A  . Years of Education: N/A   Occupational History  . RETIRED    Social History Main Topics  . Smoking status: Former Smoker --  0.5 packs/day for 40 years    Types: Cigarettes    Quit date: 08/19/2012  . Smokeless tobacco: Never Used     Comment: may smoke one here or there depends on stress  . Alcohol Use: Yes     Comment: 08/24/2012 "used to drink; not anymore; stopped > 63yrs ago; never had any problems w/it"  . Drug Use: Yes    Special: Marijuana     Comment: 08/24/2012 "last drug use >4-5 months ago"  . Sexually Active: Yes   Other Topics Concern  . Not on file   Social History Narrative   Last updated: 07/01/2011Retired and lives in [place].  Previously worked for [employer]. Tobacco Use - 1PPD x 40+ years,  has quit x 3 daysETOH- rareDrugs- occasional marijuana     No family history on file.  ROS: no nausea, vomiting; no fever, chills; no melena, hematochezia; no claudication  PHYSICAL EXAM: BP 142/85  Pulse 92  Ht 5\' 9"  (1.753 m)  Wt 207 lb 1.9 oz (93.949 kg)  BMI 30.59 kg/m2  SpO2 99% GENERAL: 61 year old male; NAD HEENT: NCAT, PERRLA, EOMI; sclera clear; no xanthelasma NECK: palpable bilateral carotid pulses, no bruits; no JVD; no TM LUNGS: Bibasilar crackles, no wheezes CARDIAC: RRR (S1, S2); no significant murmurs; no rubs or gallops ABDOMEN: soft, non-tender; intact BS EXTREMETIES: no significant peripheral edema SKIN: warm/dry; no obvious rash/lesions MUSCULOSKELETAL: no joint deformity NEURO: no focal deficit; NL affect   EKG:    ASSESSMENT & PLAN:  Chronic systolic CHF (congestive heart failure) Patient is down 2 pounds since last OV in late November, but still appears mildly volume overloaded, by exam. Therefore, will increase Lasix to 40 twice a day and check followup BMET/BNP level in 5 days. Will schedule early followup in one month with Dr. Shirlee Latch.  CARDIOMYOPATHY, ISCHEMIC Quiescent on current medication regimen, which includes Plavix, status post recent NST EMI type II, secondary to demand ischemia in setting of A/C SHF.  Chronic kidney disease (CKD), stage II  (mild) Recent post hospital labs showed stable renal function with creatinine 1.5. We'll continue to closely monitor with followup labs in the next several days, particularly in light of recent addition of digoxin.  Tobacco abuse Has cut back significantly to current level of 3-4 cigarettes daily, and appears motivated to stop completely on his own.  Carotid bruit We'll order carotid Dopplers to rule out significant ICA disease, following recent finding of left sided bruit. Previous study, 11/2010, indicated no significant bilateral ICA disease.  Hyperlipidemia Continue high dose Lipitor. Recent FLP notable  for LDL 103. Recommended reassessment in 3 months, with LDL target 70 or less, if feasible.    Gene Margarett Viti, PAC

## 2012-09-03 NOTE — Assessment & Plan Note (Signed)
We'll order carotid Dopplers to rule out significant ICA disease, following recent finding of left sided bruit. Previous study, 11/2010, indicated no significant bilateral ICA disease.

## 2012-09-03 NOTE — Assessment & Plan Note (Signed)
Patient is down 2 pounds since last OV in late November, but still appears mildly volume overloaded, by exam. Therefore, will increase Lasix to 40 twice a day and check followup BMET/BNP level in 5 days. Will schedule early followup in one month with Dr. Shirlee Latch.

## 2012-09-03 NOTE — Assessment & Plan Note (Signed)
Continue high dose Lipitor. Recent FLP notable for LDL 103. Recommended reassessment in 3 months, with LDL target 70 or less, if feasible.

## 2012-09-16 ENCOUNTER — Encounter (INDEPENDENT_AMBULATORY_CARE_PROVIDER_SITE_OTHER): Payer: PRIVATE HEALTH INSURANCE

## 2012-09-16 DIAGNOSIS — I6509 Occlusion and stenosis of unspecified vertebral artery: Secondary | ICD-10-CM

## 2012-09-16 DIAGNOSIS — R0989 Other specified symptoms and signs involving the circulatory and respiratory systems: Secondary | ICD-10-CM

## 2012-09-16 DIAGNOSIS — I6529 Occlusion and stenosis of unspecified carotid artery: Secondary | ICD-10-CM

## 2012-09-17 ENCOUNTER — Telehealth: Payer: Self-pay | Admitting: Physician Assistant

## 2012-09-17 DIAGNOSIS — I10 Essential (primary) hypertension: Secondary | ICD-10-CM

## 2012-09-17 DIAGNOSIS — I2510 Atherosclerotic heart disease of native coronary artery without angina pectoris: Secondary | ICD-10-CM

## 2012-09-17 DIAGNOSIS — I2589 Other forms of chronic ischemic heart disease: Secondary | ICD-10-CM

## 2012-09-17 DIAGNOSIS — F17200 Nicotine dependence, unspecified, uncomplicated: Secondary | ICD-10-CM

## 2012-09-17 MED ORDER — PANTOPRAZOLE SODIUM 40 MG PO TBEC: 40.0000 mg | DELAYED_RELEASE_TABLET | Freq: Every day | ORAL | Status: DC

## 2012-09-17 NOTE — Telephone Encounter (Signed)
SEND TO [facility] PLEASE PANTOPRAZOLE 40 MG I TABLET DAILY  PATIENT SAID HE IS OUT

## 2012-09-23 ENCOUNTER — Encounter: Payer: Self-pay | Admitting: Internal Medicine

## 2012-09-23 ENCOUNTER — Ambulatory Visit (INDEPENDENT_AMBULATORY_CARE_PROVIDER_SITE_OTHER): Payer: PRIVATE HEALTH INSURANCE | Admitting: *Deleted

## 2012-09-23 DIAGNOSIS — I428 Other cardiomyopathies: Secondary | ICD-10-CM

## 2012-09-23 DIAGNOSIS — Z95810 Presence of automatic (implantable) cardiac defibrillator: Secondary | ICD-10-CM

## 2012-09-27 LAB — REMOTE ICD DEVICE
DEV-0020ICD: NEGATIVE
DEVICE MODEL ICD: 147011
FVT: 0
HV IMPEDENCE: 56 Ohm
PACEART VT: 0
RV LEAD IMPEDENCE ICD: 490 Ohm
TZAT-0001FASTVT: 1
TZAT-0001SLOWVT: 1
TZAT-0013FASTVT: 4
TZAT-0013FASTVT: 4
TZAT-0013SLOWVT: 4
TZAT-0018FASTVT: NEGATIVE
TZAT-0018SLOWVT: NEGATIVE
TZON-0003FASTVT: 300 ms
TZST-0001FASTVT: 3
TZST-0001FASTVT: 5
TZST-0001FASTVT: 6
TZST-0001FASTVT: 7
TZST-0001SLOWVT: 3
TZST-0001SLOWVT: 4
TZST-0003FASTVT: 41 J
TZST-0003FASTVT: 41 J
TZST-0003FASTVT: 41 J
TZST-0003SLOWVT: 41 J
TZST-0003SLOWVT: 41 J
TZST-0003SLOWVT: 41 J
VENTRICULAR PACING ICD: 0 pct

## 2012-09-28 ENCOUNTER — Telehealth: Payer: Self-pay | Admitting: *Deleted

## 2012-09-28 DIAGNOSIS — Z79899 Other long term (current) drug therapy: Secondary | ICD-10-CM

## 2012-09-28 DIAGNOSIS — I10 Essential (primary) hypertension: Secondary | ICD-10-CM

## 2012-09-28 DIAGNOSIS — R7989 Other specified abnormal findings of blood chemistry: Secondary | ICD-10-CM

## 2012-09-28 MED ORDER — FUROSEMIDE 40 MG PO TABS: 60.0000 mg | ORAL_TABLET | Freq: Two times a day (BID) | ORAL | Status: DC

## 2012-09-28 NOTE — Telephone Encounter (Signed)
Notes Recorded by Lesle Chris, LPN on 1/61/0960 at 10:05 AM Patient notified. Patient has OV scheduled for 1/22 with Dr. Shirlee Latch. He will do labs on 1/20. Will fax lab order now to [facility]. Will do new rx for Lasix if no further changes at upcoming OV. States he just had them filled and verbalized understanding of med change.

## 2012-09-28 NOTE — Telephone Encounter (Signed)
Message copied by Lesle Chris on Tue Sep 28, 2012 10:05 AM ------      Message from: Rande Brunt      Created: Fri Sep 24, 2012 11:48 AM       BNP remains persistently elevated at 210. Recommend further increasing Lasix to 60 bid, and check f/u BMET/BNP in 5 days.

## 2012-09-28 NOTE — Telephone Encounter (Signed)
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 Recommend referral to [facility] for further w/u and recommendations.

## 2012-09-28 NOTE — Telephone Encounter (Signed)
Notes Recorded by Lesle Chris, LPN on 1/61/0960 at 10:02 AM Patient notified. Will hold off on referral to [facility] until see Dr. Shirlee Latch on 1/22. He would like to discuss further with MD before moving forward with appointment. ------  Notes Recorded by Lesle Chris, LPN on 12/18/4096 at 10:47 AM Left message to return call.  ------  Notes Recorded by Lesle Chris, LPN on 09/15/9145 at 4:18 PM Left message to return call.

## 2012-10-06 ENCOUNTER — Encounter: Payer: Self-pay | Admitting: Cardiology

## 2012-10-06 ENCOUNTER — Ambulatory Visit (INDEPENDENT_AMBULATORY_CARE_PROVIDER_SITE_OTHER): Payer: PRIVATE HEALTH INSURANCE | Admitting: Cardiology

## 2012-10-06 VITALS — BP 118/79 | HR 75 | Ht 69.5 in | Wt 214.4 lb

## 2012-10-06 DIAGNOSIS — I5022 Chronic systolic (congestive) heart failure: Secondary | ICD-10-CM

## 2012-10-06 DIAGNOSIS — I509 Heart failure, unspecified: Secondary | ICD-10-CM

## 2012-10-06 DIAGNOSIS — I2510 Atherosclerotic heart disease of native coronary artery without angina pectoris: Secondary | ICD-10-CM

## 2012-10-06 DIAGNOSIS — E785 Hyperlipidemia, unspecified: Secondary | ICD-10-CM

## 2012-10-06 DIAGNOSIS — I10 Essential (primary) hypertension: Secondary | ICD-10-CM

## 2012-10-06 DIAGNOSIS — Z720 Tobacco use: Secondary | ICD-10-CM

## 2012-10-06 DIAGNOSIS — R0989 Other specified symptoms and signs involving the circulatory and respiratory systems: Secondary | ICD-10-CM

## 2012-10-06 DIAGNOSIS — F17200 Nicotine dependence, unspecified, uncomplicated: Secondary | ICD-10-CM

## 2012-10-06 DIAGNOSIS — Z79899 Other long term (current) drug therapy: Secondary | ICD-10-CM

## 2012-10-06 MED ORDER — POTASSIUM CHLORIDE CRYS ER 20 MEQ PO TBCR: 40.0000 meq | EXTENDED_RELEASE_TABLET | Freq: Every day | ORAL | Status: DC

## 2012-10-06 MED ORDER — ISOSORBIDE DINITRATE ER 40 MG PO CPCR: 40.0000 mg | ORAL_CAPSULE | Freq: Three times a day (TID) | ORAL | Status: DC

## 2012-10-06 MED ORDER — HYDRALAZINE HCL 25 MG PO TABS: 75.0000 mg | ORAL_TABLET | Freq: Three times a day (TID) | ORAL | Status: DC

## 2012-10-06 NOTE — Progress Notes (Signed)
Patient ID: Adam House, male   DOB: September 12, 1951, 62 y.o.   MRN: [id number] PCP: Dr. Felecia Shelling  62 yo with history of CAD and ischemic cardiomyopathy presents for cardiology followup.  He had an anterolateral MI in 8/13 treated with overlapping Promus stents in the LAD.  EF 25-30% on last echo.  He had post-MI pericarditis as well as acute on chronic systolic CHF during 8/13 admission and was diuresed 15 lbs.  He was re-admitted in 12/13 with acute on chronic systolic CHF and diuresed.  He felt much better with diuresis.  Of note, TnI was 1.7 in the hospital in 12/13 but he had no chest pain.  This was attributed to demand ischemia in the setting of systolic CHF/volume overload + CKD.    Since discharge, he has been breathing better overall.  Given persistent increase in BNP, his Lasix was recently increased to 60 mg bid.  He has been taking Bidil but it is getting too expensive for him.  He walks about 3/4 mile several times a week.  No exertional dyspnea and he is able to walk up a hill with no problems.  No chest pain, orthopnea, or PND.  He has gained weight since last appointment and attributes this to eating more and drinking a lot of [facility].   He is still smoking 5-6 cigs/day.  He is not taking an ACEI due to angioedema.  Finally, recent carotid dopplers showed occluded RICA (chronic).   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  2. CKD 3. GERD 4. CAD: Anterior MI in 1998 with PCI to LAD.  Anterolateral MI in 8/13 with 100% mLAD, 70% dLAD treated with overlapping Promus DES.   5. Post-MI pericarditis in 8/13.  6. Ischemic cardiomyopathy: Last echo (8/13) with EF 25-30%, grade II diastolic dysfunction, apical akinesis, mildly dilated RV and moderately  dilated LV, no significant valvular abnormalities.  He has a Environmental manager ICD.  7. Hyperlipidemia 8. Active smoker.  9. H/o angioedema with ACEI.   SH: Married, retired, lives in [place].  Rare ETOH, 4-6 cigarettes/day.   FH: CAD  ROS: All systems reviewed and negative except as per HPI.   Current Outpatient Prescriptions  Medication Sig Dispense Refill  . albuterol (PROVENTIL HFA;VENTOLIN HFA) 108 (90 BASE) MCG/ACT inhaler Inhale 2 puffs into the lungs every 6 (six) hours as needed. For shortness of breath      . allopurinol (ZYLOPRIM) 100 MG tablet Take 100 mg by mouth daily.      [name] aspirin EC 81 MG tablet Take 1 tablet (81 mg total) by mouth daily.      [name] atorvastatin (LIPITOR) 80 MG tablet Take 1 tablet (80 mg total) by mouth every evening.  30 tablet  3  . clopidogrel (PLAVIX) 75 MG tablet Take 1 tablet (75 mg total) by mouth daily with breakfast.  30 tablet  11  . colchicine 0.6 MG tablet Take 1 tablet (0.6 mg total) by mouth daily as needed. For gout  30 tablet  11  . digoxin (LANOXIN) 0.125 MG tablet Take 1 tablet (0.125 mg total) by mouth daily.  30 tablet  3  . fish oil-omega-3 fatty acids 1000 MG capsule Take 2 g by mouth daily.      [name]  furosemide (LASIX) 40 MG tablet Take 1.5 tablets (60 mg total) by mouth 2 (two) times daily.      . isosorbide-hydrALAZINE (BIDIL) 20-37.5 MG per tablet Take 1.5 tablets by mouth 3 (three) times daily. Dose increased 08/11/2012.      . metoprolol succinate (TOPROL-XL) 100 MG 24 hr tablet Take 1 1/2 tabs (150mg ) by mouth daily  45 tablet  6  . nitroGLYCERIN (NITROSTAT) 0.4 MG SL tablet Place 1 tablet (0.4 mg total) under the tongue every 5 (five) minutes as needed for chest pain (up to 3 doses).      . pantoprazole (PROTONIX) 40 MG tablet Take 1 tablet (40 mg total) by mouth daily.  90 tablet  1  . spironolactone (ALDACTONE) 25 MG tablet Take 25 mg by mouth daily.      . hydrALAZINE (APRESOLINE) 25 MG tablet Take 3 tablets (75 mg total) by mouth  3 (three) times daily.  270 tablet  6  . isosorbide dinitrate (DILATRATE-SR) 40 MG CR capsule Take 1 capsule (40 mg total) by mouth 3 (three) times daily.  90 capsule  6  . potassium chloride SA (K-DUR,KLOR-CON) 20 MEQ tablet Take 2 tablets (40 mEq total) by mouth daily.  60 tablet  6    BP 118/79  Pulse 75  Ht 5' 9.5" (1.765 m)  Wt 214 lb 6.4 oz (97.251 kg)  BMI 31.21 kg/m2 General: NAD Neck: No JVD, no thyromegaly or thyroid nodule.  Lungs: Clear to auscultation bilaterally with normal respiratory effort. CV: Lateral PMI.  Heart regular S1/S2, no S3/S4, no murmur.  No peripheral edema.  Right carotid bruit.  Normal pedal pulses.  Abdomen: Soft, nontender, no hepatosplenomegaly, no distention.  Neurologic: Alert and oriented x 3.  Psych: Normal affect. Extremities: No clubbing or cyanosis.   Assessment/Plan:  1. CAD: Anterolateral STEMI in 8/13 with overlapping DES to LAD.  No chest pain.  He will continue cardiac rehab.  During recent CHF admission in 12/13, troponin was elevated to 1.7 with no chest pain.  Suspect this was demand ischemia due to volume overload.  Continue ASA 81, Plavix, statin, Toprol XL.  2. Chronic systolic CHF: Mr Letts looks near-euvolemic on exam.  He has NYHA class II symptoms (improved).  He is not on ACEI due to history of angioedema.   - Continue Toprol XL 150 mg daily and spironolactone 25.  - Continue Lasix 60 mg bid.   - Increase Bidil to 2 tabs tid.  Alternatively, he can take hydralazine 75 mg tid + isorsorbide dinitrate 40 mg tid (he will price to see which way will be least expensive).   - Add KCl 40 given low K on Lasix.  - BMET/BNP in 10 days.  - I think that we can add an ARB next as the current data suggests that the risk of angioedema with ARB in someone who had angioedema with ACEI is not very high overall.  3. Hyperlipidemia: Continue high dose statin, check lipids today.  4. Smoking: I again encouraged him to try nicotine patches to quit.    5. Carotid stenosis: Chronic RICA occlusion with minimal LICA disease.  Needs to stay on statin, ASA, Plavix and will repeat carotids in 1 year in 1/15.  6. CKD: Follow BMET closely with med adjustments.   [name] 10/06/2012 11:25 AM

## 2012-10-06 NOTE — Patient Instructions (Addendum)
   Bidil - 2 tabs three times per day  OR  Hydralazine 75mg  three times per day & Isosorbide Dinitrate 40mg  three times per day  Begin K-Dur (potassium) daily Continue all other current medications.  Follow up in  6 weeks

## 2012-10-11 ENCOUNTER — Encounter: Payer: Self-pay | Admitting: *Deleted

## 2012-11-01 ENCOUNTER — Ambulatory Visit: Payer: PRIVATE HEALTH INSURANCE | Admitting: Cardiology

## 2012-11-02 ENCOUNTER — Telehealth: Payer: Self-pay | Admitting: *Deleted

## 2012-11-02 NOTE — Telephone Encounter (Signed)
Message copied by Lesle Chris on Tue Nov 02, 2012  2:01 PM ------      Message from: Laurey Morale      Created: Thu Oct 28, 2012  9:27 PM       Creatinine better.  LDL higher than expected.  Is he taking atorvastatin 80 mg daily as prescribed? ------

## 2012-11-02 NOTE — Telephone Encounter (Signed)
Notes Recorded by Lesle Chris, LPN on 1/61/0960 at 2:00 PM Patient notified. Patient confirmed that he is taking Atorvastatin 80mg  every evening as prescribed. Patient has OV scheduled for 3/11 with Dr. Shirlee Latch. He will discuss further at upcoming visit.

## 2012-11-23 ENCOUNTER — Ambulatory Visit (INDEPENDENT_AMBULATORY_CARE_PROVIDER_SITE_OTHER): Payer: PRIVATE HEALTH INSURANCE | Admitting: Cardiology

## 2012-11-23 ENCOUNTER — Encounter: Payer: Self-pay | Admitting: Cardiology

## 2012-11-23 VITALS — BP 122/79 | HR 75 | Ht 69.5 in | Wt 212.0 lb

## 2012-11-23 DIAGNOSIS — R0989 Other specified symptoms and signs involving the circulatory and respiratory systems: Secondary | ICD-10-CM

## 2012-11-23 DIAGNOSIS — I25810 Atherosclerosis of coronary artery bypass graft(s) without angina pectoris: Secondary | ICD-10-CM

## 2012-11-23 DIAGNOSIS — F17200 Nicotine dependence, unspecified, uncomplicated: Secondary | ICD-10-CM

## 2012-11-23 DIAGNOSIS — I5022 Chronic systolic (congestive) heart failure: Secondary | ICD-10-CM

## 2012-11-23 DIAGNOSIS — I509 Heart failure, unspecified: Secondary | ICD-10-CM

## 2012-11-23 DIAGNOSIS — I10 Essential (primary) hypertension: Secondary | ICD-10-CM

## 2012-11-23 DIAGNOSIS — I2510 Atherosclerotic heart disease of native coronary artery without angina pectoris: Secondary | ICD-10-CM

## 2012-11-23 DIAGNOSIS — E785 Hyperlipidemia, unspecified: Secondary | ICD-10-CM

## 2012-11-23 DIAGNOSIS — Z720 Tobacco use: Secondary | ICD-10-CM

## 2012-11-23 DIAGNOSIS — Z79899 Other long term (current) drug therapy: Secondary | ICD-10-CM

## 2012-11-23 MED ORDER — LOSARTAN POTASSIUM 25 MG PO TABS: 25.0000 mg | ORAL_TABLET | Freq: Every day | ORAL | Status: DC

## 2012-11-23 MED ORDER — ROSUVASTATIN CALCIUM 40 MG PO TABS: 40.0000 mg | ORAL_TABLET | Freq: Every evening | ORAL | Status: DC

## 2012-11-23 NOTE — Progress Notes (Signed)
Patient ID: Adam House, male   DOB: 1950/09/28, 62 y.o.   MRN: [id number] PCP: Dr. Felecia Shelling  62 yo with history of CAD and ischemic cardiomyopathy presents for cardiology followup.  He had an anterolateral MI in 8/13 treated with overlapping Promus stents in the LAD.  EF 25-30% on last echo.  He had post-MI pericarditis as well as acute on chronic systolic CHF during 8/13 admission and was diuresed 15 lbs.  He was re-admitted in 12/13 with acute on chronic systolic CHF and diuresed.  He felt much better with diuresis.  Of note, TnI was 1.7 in the hospital in 12/13 but he had no chest pain.  This was attributed to demand ischemia in the setting of systolic CHF/volume overload + CKD.    Since last appointment, he has been stable.  No dyspnea walking on flat ground.  Mild dyspnea walking up a flight of steps.  No orthopnea or PND.  No chest pain.  Weight is down 2 lbs.  He continues to smoke around 1/2 ppd.  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  2. CKD 3. GERD 4. CAD: Anterior MI in 1998 with PCI to LAD.  Anterolateral MI in 8/13 with 100% mLAD, 70% dLAD treated with overlapping Promus DES.   5. Post-MI pericarditis in 8/13.  6. Ischemic cardiomyopathy: Last echo (8/13) with EF 25-30%, grade II diastolic dysfunction, apical akinesis, mildly dilated RV and moderately dilated LV, no significant valvular abnormalities.  He has a Environmental manager ICD.  7. Hyperlipidemia 8. Active smoker.  9. H/o angioedema with ACEI.   SH: Married, retired, lives in [place].  Rare ETOH, 4-6 cigarettes/day.   FH: CAD  ROS: All systems reviewed and negative except as per HPI.   Current Outpatient  Prescriptions  Medication Sig Dispense Refill  . albuterol (PROVENTIL HFA;VENTOLIN HFA) 108 (90 BASE) MCG/ACT inhaler Inhale 2 puffs into the lungs every 6 (six) hours as needed. For shortness of breath      . allopurinol (ZYLOPRIM) 100 MG tablet Take 100 mg by mouth daily.      [name] aspirin EC 81 MG tablet Take 1 tablet (81 mg total) by mouth daily.      . clopidogrel (PLAVIX) 75 MG tablet Take 1 tablet (75 mg total) by mouth daily with breakfast.  30 tablet  11  . colchicine 0.6 MG tablet Take 1 tablet (0.6 mg total) by mouth daily as needed. For gout  30 tablet  11  . digoxin (LANOXIN) 0.125 MG tablet Take 1 tablet (0.125 mg total) by mouth daily.  30 tablet  3  . fish oil-omega-3 fatty acids 1000 MG capsule Take 2 g by mouth daily.      . furosemide (LASIX) 40 MG tablet Take 1.5 tablets (60 mg total) by mouth 2 (two) times daily.      . hydrALAZINE (APRESOLINE) 25 MG tablet Take 3 tablets (75 mg total) by mouth 3 (three) times daily.  270 tablet  6  . isosorbide dinitrate (DILATRATE-SR) 40 MG CR capsule Take 1 capsule (40 mg total) by mouth 3 (three) times daily.  90 capsule  6  . metoprolol succinate (  TOPROL-XL) 100 MG 24 hr tablet Take 1 1/2 tabs (150mg ) by mouth daily  45 tablet  6  . multivitamin (ONE-A-DAY MEN'S) TABS Take 1 tablet by mouth daily.      . nitroGLYCERIN (NITROSTAT) 0.4 MG SL tablet Place 1 tablet (0.4 mg total) under the tongue every 5 (five) minutes as needed for chest pain (up to 3 doses).      . pantoprazole (PROTONIX) 40 MG tablet Take 1 tablet (40 mg total) by mouth daily.  90 tablet  1  . potassium chloride SA (K-DUR,KLOR-CON) 20 MEQ tablet Take 2 tablets (40 mEq total) by mouth daily.  60 tablet  6  . spironolactone (ALDACTONE) 25 MG tablet Take 25 mg by mouth daily.      [name] losartan (COZAAR) 25 MG tablet Take 1 tablet (25 mg total) by mouth daily.  30 tablet  6  . rosuvastatin (CRESTOR) 40 MG tablet Take 1 tablet (40 mg total) by mouth every evening.  30 tablet  6    No current facility-administered medications for this visit.    BP 122/79  Pulse 75  Ht 5' 9.5" (1.765 m)  Wt 212 lb (96.163 kg)  BMI 30.87 kg/m2 General: NAD Neck: No JVD, no thyromegaly or thyroid nodule.  Lungs: Clear to auscultation bilaterally with normal respiratory effort. CV: Lateral PMI.  Heart regular S1/S2, no S3/S4, no murmur.  No peripheral edema.  Right carotid bruit.  Normal pedal pulses.  Abdomen: Soft, nontender, no hepatosplenomegaly, no distention.  Neurologic: Alert and oriented x 3.  Psych: Normal affect. Extremities: No clubbing or cyanosis.   Assessment/Plan:  1. CAD: Anterolateral STEMI in 8/13 with overlapping DES to LAD.  No chest pain.  During recent CHF admission in 12/13, troponin was elevated to 1.7 with no chest pain.  Suspect this was demand ischemia due to volume overload.  Continue ASA 81, Plavix, statin, Toprol XL.  2. Chronic systolic CHF: Mr Jowett looks near-euvolemic on exam.  He has NYHA class II symptoms.  He is not on ACEI due to history of angioedema.   - Continue Toprol XL 150 mg daily, hydralazine/isordil, and spironolactone 25.  - Continue Lasix 60 mg bid.   - I am going to start him on losartan 25 mg daily as the current data suggests that the risk of angioedema with ARB in someone who had angioedema with ACEI is not very high overall.  Will monitor closely.  He will need BMET/BNP in 10 days.  3. Hyperlipidemia: LDL higher than I would like on atorvastatin.  He was on Crestor in the past with no problems.  I will have him stop atorvastatin and start Crestor 40 mg daily.  Check lipids/LFTs in 3 months.  4. Smoking: I again encouraged him to try nicotine patches to quit.  5. Carotid stenosis: Chronic RICA occlusion with minimal LICA disease.  Needs to stay on statin, ASA, Plavix and will repeat carotids in 1/15.  6. CKD: Follow BMET closely with med adjustments.   [name] 11/23/2012 3:26 PM

## 2012-11-23 NOTE — Patient Instructions (Signed)
   Begin Losartan 25mg  daily - new sent to [facility] current supply of Lipitor, then change to Crestor 40mg  every evening - new sent to pharm  Labs due in 10 days for BMET, BNP  Labs due in 3 months for FLP, LFT   Office will notify of results  Follow up in  4 months

## 2012-12-09 ENCOUNTER — Encounter: Payer: Self-pay | Admitting: *Deleted

## 2012-12-23 ENCOUNTER — Ambulatory Visit (INDEPENDENT_AMBULATORY_CARE_PROVIDER_SITE_OTHER): Payer: PRIVATE HEALTH INSURANCE | Admitting: *Deleted

## 2012-12-23 ENCOUNTER — Encounter: Payer: Self-pay | Admitting: Internal Medicine

## 2012-12-23 ENCOUNTER — Other Ambulatory Visit: Payer: Self-pay | Admitting: Internal Medicine

## 2012-12-23 DIAGNOSIS — I428 Other cardiomyopathies: Secondary | ICD-10-CM

## 2012-12-23 DIAGNOSIS — Z95810 Presence of automatic (implantable) cardiac defibrillator: Secondary | ICD-10-CM

## 2012-12-30 LAB — REMOTE ICD DEVICE
AL AMPLITUDE: 9.9 mv
ATRIAL PACING ICD: 0 pct
CHARGE TIME: 8.9 s
DEV-0020ICD: NEGATIVE
RV LEAD AMPLITUDE: 12 mv
TZAT-0001SLOWVT: 2
TZAT-0013FASTVT: 4
TZAT-0013SLOWVT: 4
TZAT-0018FASTVT: NEGATIVE
TZAT-0018SLOWVT: NEGATIVE
TZST-0001FASTVT: 3
TZST-0001FASTVT: 4
TZST-0001FASTVT: 5
TZST-0001FASTVT: 8
TZST-0001SLOWVT: 4
TZST-0001SLOWVT: 7
TZST-0003FASTVT: 41 J
TZST-0003FASTVT: 41 J
TZST-0003SLOWVT: 41 J
TZST-0003SLOWVT: 41 J
TZST-0003SLOWVT: 41 J
TZST-0003SLOWVT: 41 J
VENTRICULAR PACING ICD: 0 pct

## 2013-01-02 ENCOUNTER — Encounter: Payer: Self-pay | Admitting: Internal Medicine

## 2013-01-10 ENCOUNTER — Telehealth: Payer: Self-pay | Admitting: Cardiology

## 2013-01-10 NOTE — Telephone Encounter (Signed)
ASKING IF LABS ARE DUE MID June - please call patient

## 2013-01-11 NOTE — Telephone Encounter (Signed)
Left message on machine advising that he is due mid June for labs.  Will mail orders when time.

## 2013-01-28 ENCOUNTER — Encounter: Payer: Self-pay | Admitting: *Deleted

## 2013-03-01 ENCOUNTER — Telehealth: Payer: Self-pay | Admitting: Physician Assistant

## 2013-03-01 NOTE — Telephone Encounter (Signed)
Message copied by Lesle Chris on Tue Mar 01, 2013  3:32 PM ------      Message from: Laurey Morale      Created: Sun Feb 27, 2013 11:27 PM       LDL higher than I would like it.  Make sure he is taking Crestor daily and stress dietary compliance. ------

## 2013-03-01 NOTE — Telephone Encounter (Signed)
Returned your call.

## 2013-03-01 NOTE — Telephone Encounter (Signed)
Notes Recorded by Lesle Chris, LPN on 1/61/0960 at 3:32 PM Patient states he has been back on Crestor since 3/11 & is watching his diet very carefully. Has follow up scheduled for 7/1 with Dr. Shirlee Latch & can discuss further at that time.  Notes Recorded by Lesle Chris, LPN on 4/54/0981 at 10:06 AM Left message to return call.

## 2013-03-15 ENCOUNTER — Ambulatory Visit (INDEPENDENT_AMBULATORY_CARE_PROVIDER_SITE_OTHER): Payer: PRIVATE HEALTH INSURANCE | Admitting: Cardiology

## 2013-03-15 ENCOUNTER — Encounter: Payer: Self-pay | Admitting: Cardiology

## 2013-03-15 VITALS — BP 140/88 | HR 97 | Ht 69.0 in | Wt 206.0 lb

## 2013-03-15 DIAGNOSIS — I10 Essential (primary) hypertension: Secondary | ICD-10-CM

## 2013-03-15 DIAGNOSIS — N19 Unspecified kidney failure: Secondary | ICD-10-CM

## 2013-03-15 DIAGNOSIS — F17200 Nicotine dependence, unspecified, uncomplicated: Secondary | ICD-10-CM

## 2013-03-15 DIAGNOSIS — I2510 Atherosclerotic heart disease of native coronary artery without angina pectoris: Secondary | ICD-10-CM

## 2013-03-15 DIAGNOSIS — Z79899 Other long term (current) drug therapy: Secondary | ICD-10-CM

## 2013-03-15 DIAGNOSIS — Z720 Tobacco use: Secondary | ICD-10-CM

## 2013-03-15 DIAGNOSIS — I519 Heart disease, unspecified: Secondary | ICD-10-CM

## 2013-03-15 DIAGNOSIS — R0989 Other specified symptoms and signs involving the circulatory and respiratory systems: Secondary | ICD-10-CM

## 2013-03-15 MED ORDER — METOPROLOL SUCCINATE ER 200 MG PO TB24: 200.0000 mg | ORAL_TABLET | Freq: Every day | ORAL | Status: DC

## 2013-03-15 NOTE — Patient Instructions (Signed)
   Increase Toprol XL to 200mg  daily - new sent to pharm  Lab for BMET today Office will contact with results via phone or letter.   Continue all other current medications. Follow up in  3 months

## 2013-03-15 NOTE — Progress Notes (Signed)
Patient ID: JACAI KIPP, male   DOB: 04-Feb-1951, 62 y.o.   MRN: [id number] PCP: Dr. Felecia Shelling  62 yo with history of CAD and ischemic cardiomyopathy presents for cardiology followup.  He had an anterolateral MI in 8/13 treated with overlapping Promus stents in the LAD.  EF 25-30% on last echo.  He had post-MI pericarditis as well as acute on chronic systolic CHF during 8/13 admission and was diuresed 15 lbs.  He was re-admitted in 12/13 with acute on chronic systolic CHF and diuresed.  He felt much better with diuresis.  Of note, TnI was 1.7 in the hospital in 12/13 but he had no chest pain.  This was attributed to demand ischemia in the setting of systolic CHF/volume overload + CKD.    Since last appointment, he has been stable.  No dyspnea walking on flat ground.  He is walking 1/4-3/4 miles daily.  Mild dyspnea walking up a flight of steps.  No orthopnea or PND.  No chest pain.  Weight is down 6 lbs.  He continues to smoke around 1/2 ppd but has started using the nicotine patch.    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  2. CKD 3. GERD 4. CAD: Anterior MI in 1998 with PCI to LAD.  Anterolateral MI in 8/13 with 100% mLAD, 70% dLAD treated with overlapping Promus DES.   5. Post-MI pericarditis in 8/13.  6. Ischemic cardiomyopathy: Last echo (8/13) with EF 25-30%, grade II diastolic dysfunction, apical akinesis, mildly dilated RV and moderately dilated LV, no significant valvular abnormalities.  He has a Environmental manager ICD.  7. Hyperlipidemia 8. Active smoker.  9. H/o angioedema with ACEI.   SH: Married, retired, lives in [place].  Rare ETOH,  4-6 cigarettes/day.   FH: CAD  ROS: All systems reviewed and negative except as per HPI.   Current Outpatient Prescriptions  Medication Sig Dispense Refill  . albuterol (PROVENTIL HFA;VENTOLIN HFA) 108 (90 BASE) MCG/ACT inhaler Inhale 2 puffs into the lungs every 6 (six) hours as needed. For shortness of breath      . allopurinol (ZYLOPRIM) 100 MG tablet Take 100 mg by mouth daily.      [name] aspirin EC 81 MG tablet Take 1 tablet (81 mg total) by mouth daily.      . clopidogrel (PLAVIX) 75 MG tablet Take 1 tablet (75 mg total) by mouth daily with breakfast.  30 tablet  11  . colchicine 0.6 MG tablet Take 1 tablet (0.6 mg total) by mouth daily as needed. For gout  30 tablet  11  . digoxin (LANOXIN) 0.125 MG tablet Take 1 tablet (0.125 mg total) by mouth daily.  30 tablet  3  . fish oil-omega-3 fatty acids 1000 MG capsule Take 2 g by mouth daily.      . furosemide (LASIX) 40 MG tablet Take 1.5 tablets (60 mg total) by mouth 2 (two) times daily.      . hydrALAZINE (APRESOLINE) 25 MG tablet Take 3 tablets (75 mg total) by mouth 3 (three) times daily.  270 tablet  6  . isosorbide dinitrate (DILATRATE-SR) 40 MG CR capsule Take  1 capsule (40 mg total) by mouth 3 (three) times daily.  90 capsule  6  . losartan (COZAAR) 25 MG tablet Take 1 tablet (25 mg total) by mouth daily.  30 tablet  6  . metoprolol succinate (TOPROL-XL) 200 MG 24 hr tablet Take 1 tablet (200 mg total) by mouth daily.  30 tablet  6  . multivitamin (ONE-A-DAY MEN'S) TABS Take 1 tablet by mouth daily.      . nicotine (NICODERM CQ - DOSED IN MG/24 HOURS) 14 mg/24hr patch Place 1 patch onto the skin daily.      . nitroGLYCERIN (NITROSTAT) 0.4 MG SL tablet Place 1 tablet (0.4 mg total) under the tongue every 5 (five) minutes as needed for chest pain (up to 3 doses).      . pantoprazole (PROTONIX) 40 MG tablet Take 40 mg by mouth as needed.      . potassium chloride SA (K-DUR,KLOR-CON) 20 MEQ tablet Take 2 tablets (40 mEq total) by mouth  daily.  60 tablet  6  . rosuvastatin (CRESTOR) 40 MG tablet Take 1 tablet (40 mg total) by mouth every evening.  30 tablet  6  . spironolactone (ALDACTONE) 25 MG tablet Take 25 mg by mouth daily.       No current facility-administered medications for this visit.    BP 140/88  Pulse 97  Ht 5\' 9"  (1.753 m)  Wt 206 lb (93.441 kg)  BMI 30.41 kg/m2 General: NAD Neck: No JVD, no thyromegaly or thyroid nodule.  Lungs: Clear to auscultation bilaterally with normal respiratory effort. CV: Lateral PMI.  Heart regular S1/S2, no S3/S4, no murmur.  No peripheral edema.  Right carotid bruit.  Normal pedal pulses.  Abdomen: Soft, nontender, no hepatosplenomegaly, no distention.  Neurologic: Alert and oriented x 3.  Psych: Normal affect. Extremities: No clubbing or cyanosis.   Assessment/Plan:  1. CAD: Anterolateral STEMI in 8/13 with overlapping DES to LAD.  No chest pain.  During recent CHF admission in 12/13, troponin was elevated to 1.7 with no chest pain.  Suspect this was demand ischemia due to volume overload.  Continue ASA 81, Plavix, statin, Toprol XL.  2. Chronic systolic CHF: Mr Tamburrino looks near-euvolemic on exam.  He has NYHA class II symptoms.  He is not on ACEI due to history of angioedema.   - Continue losartan, hydralazine/isordil, and spironolactone 25.  - Continue Lasix 60 mg bid.   - Increase Toprol XL to 200 mg daily.  - BMET today - Followup 3 months.  3. Hyperlipidemia: Continue Crestor.  Lipids not ideal but he is on highest dose of Crestor.  4. Smoking: I again encouraged him to quit.  He has nicotine patches.  5. Carotid stenosis: Chronic RICA occlusion with minimal LICA disease.  Needs to stay on statin, ASA, Plavix and will repeat carotids in 1/15.  6. CKD: BMET today.   [name] 03/15/2013 11:02 PM

## 2013-03-25 ENCOUNTER — Encounter: Payer: Self-pay | Admitting: Internal Medicine

## 2013-03-25 ENCOUNTER — Ambulatory Visit (INDEPENDENT_AMBULATORY_CARE_PROVIDER_SITE_OTHER): Payer: PRIVATE HEALTH INSURANCE | Admitting: Internal Medicine

## 2013-03-25 VITALS — BP 99/66 | HR 66 | Ht 69.0 in | Wt 206.8 lb

## 2013-03-25 DIAGNOSIS — Z4502 Encounter for adjustment and management of automatic implantable cardiac defibrillator: Secondary | ICD-10-CM

## 2013-03-25 DIAGNOSIS — I428 Other cardiomyopathies: Secondary | ICD-10-CM

## 2013-03-25 DIAGNOSIS — I10 Essential (primary) hypertension: Secondary | ICD-10-CM

## 2013-03-25 DIAGNOSIS — I2589 Other forms of chronic ischemic heart disease: Secondary | ICD-10-CM

## 2013-03-25 LAB — ICD DEVICE OBSERVATION
AL AMPLITUDE: 8.3 mv
AL IMPEDENCE ICD: 533 Ohm
ATRIAL PACING ICD: 1 pct
DEVICE MODEL ICD: 147011
HV IMPEDENCE: 55 Ohm
RV LEAD IMPEDENCE ICD: 491 Ohm
TZAT-0001FASTVT: 1
TZAT-0001SLOWVT: 2
TZAT-0013FASTVT: 4
TZAT-0013SLOWVT: 4
TZAT-0018FASTVT: NEGATIVE
TZAT-0018FASTVT: NEGATIVE
TZAT-0018SLOWVT: NEGATIVE
TZON-0003SLOWVT: 352.9 ms
TZST-0001FASTVT: 3
TZST-0001FASTVT: 4
TZST-0001FASTVT: 7
TZST-0001FASTVT: 8
TZST-0001SLOWVT: 3
TZST-0001SLOWVT: 6
TZST-0001SLOWVT: 7
TZST-0003FASTVT: 41 J
TZST-0003FASTVT: 41 J
TZST-0003SLOWVT: 41 J
TZST-0003SLOWVT: 41 J
TZST-0003SLOWVT: 41 J
VENTRICULAR PACING ICD: 1 pct

## 2013-03-25 NOTE — Patient Instructions (Addendum)
   Remote check 06/23/2013  Dr. Johney Frame - 1 year  Continue all current medications.

## 2013-03-25 NOTE — Progress Notes (Signed)
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 male who presents today for routine electrophysiology followup.  Since last being seen in our clinic, the patient reports doing very well.   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  He is hoping to buy a camaro. Today, he denies symptoms of palpitations, chest pain, shortness of breath,  lower extremity edema, dizziness, presyncope, syncope, or ICD shocks.  The patient is otherwise without complaint today.   Past Medical History  Diagnosis Date  . CAD (coronary artery disease)     a. ant MI 1998 with PCI to LAD. b. Ant-lat MI 04/2012 tx with overlapping DES to LAD.  [name] Systolic CHF     NYHA Class II/III  . Ischemic cardiomyopathy     EF 25-30% by echo 04/2012; [employer] ICD  . CRI (chronic renal insufficiency)   . Unspecified essential hypertension   . Impotence of organic origin   . Esophageal reflux   . Tobacco use disorder   . ICD (implantable cardiac defibrillator) in place   . Hypercholesteremia   . Heart murmur   . CVA (cerebral infarction) 11/2010  . Gout   . Post-MI pericarditis     04/2012   Past Surgical History  Procedure Laterality Date  . Anterior cervical decomp/discectomy fusion  2007    "put in 4 screws to hold my head on; ruptured C5; job related injury" (08/24/2012)  . Cardiac defibrillator placement  2011    [employer]  he is a [facility] R[facility] study patient  . Coronary angioplasty with stent placement  05/1997; 04/2012    "1 + 2; total of 3" (08/24/2012)    Current Outpatient Prescriptions  Medication Sig Dispense Refill  . albuterol (PROVENTIL HFA;VENTOLIN HFA) 108 (90 BASE) MCG/ACT inhaler Inhale 2 puffs into the lungs every 6 (six) hours as needed. For shortness of breath      . allopurinol (ZYLOPRIM) 100 MG tablet Take 100 mg by mouth daily.      [name] aspirin EC 81 MG tablet Take 1 tablet (81 mg total) by mouth daily.      . clopidogrel (PLAVIX) 75 MG tablet Take 1  tablet (75 mg total) by mouth daily with breakfast.  30 tablet  11  . colchicine 0.6 MG tablet Take 1 tablet (0.6 mg total) by mouth daily as needed. For gout  30 tablet  11  . digoxin (LANOXIN) 0.125 MG tablet Take 1 tablet (0.125 mg total) by mouth daily.  30 tablet  3  . fish oil-omega-3 fatty acids 1000 MG capsule Take 2 g by mouth daily.      . furosemide (LASIX) 40 MG tablet Take 1.5 tablets (60 mg total) by mouth 2 (two) times daily.      . hydrALAZINE (APRESOLINE) 25 MG tablet Take 3 tablets (75 mg total) by mouth 3 (three) times daily.  270 tablet  6  . isosorbide dinitrate (DILATRATE-SR) 40 MG CR capsule Take 1 capsule (40 mg total) by mouth 3 (three) times daily.  90 capsule  6  . losartan (COZAAR) 25 MG tablet Take 1 tablet (25 mg total) by mouth daily.  30 tablet  6  . metoprolol succinate (TOPROL-XL) 200 MG 24 hr tablet Take 1 tablet (200 mg total) by mouth daily.  30 tablet  6  . multivitamin (ONE-A-DAY MEN'S) TABS Take 1 tablet by mouth daily.      . nicotine (NICODERM CQ - DOSED IN  MG/24 HOURS) 14 mg/24hr patch Place 1 patch onto the skin daily.      . nitroGLYCERIN (NITROSTAT) 0.4 MG SL tablet Place 1 tablet (0.4 mg total) under the tongue every 5 (five) minutes as needed for chest pain (up to 3 doses).      . pantoprazole (PROTONIX) 40 MG tablet Take 40 mg by mouth as needed.      . potassium chloride SA (K-DUR,KLOR-CON) 20 MEQ tablet Take 2 tablets (40 mEq total) by mouth daily.  60 tablet  6  . rosuvastatin (CRESTOR) 40 MG tablet Take 1 tablet (40 mg total) by mouth every evening.  30 tablet  6  . spironolactone (ALDACTONE) 25 MG tablet Take 25 mg by mouth daily.       No current facility-administered medications for this visit.    Physical Exam: Filed Vitals:   03/25/13 0931  BP: 99/66  Pulse: 66  Height: 5\' 9"  (1.753 m)  Weight: 206 lb 12.8 oz (93.804 kg)    GEN- The patient is well appearing, alert and oriented x 3 today.   Head- normocephalic, atraumatic Eyes-   Sclera clear, conjunctiva pink Ears- hearing intact Oropharynx- clear Lungs- Clear to ausculation bilaterally, normal work of breathing Chest- ICD pocket is well healed Heart- Regular rate and rhythm, no murmurs, rubs or gallops, PMI not laterally displaced GI- soft, NT, ND, + BS Extremities- no clubbing, cyanosis, or edema  ICD interrogation- reviewed in detail today,  See PACEART report  Assessment and Plan:  1. Ischemic CM/ CAD/ chronic systolic dysfunction Stable, no ischemic symptoms, euvolemic  Normal ICD function See Pace Art report No changes today  2. HTN Stable No change required today  [facility] Return in 1 year

## 2013-03-29 ENCOUNTER — Telehealth: Payer: Self-pay | Admitting: *Deleted

## 2013-03-29 DIAGNOSIS — E875 Hyperkalemia: Secondary | ICD-10-CM

## 2013-03-29 DIAGNOSIS — Z79899 Other long term (current) drug therapy: Secondary | ICD-10-CM

## 2013-03-29 NOTE — Telephone Encounter (Signed)
Message copied by Lesle Chris on Tue Mar 29, 2013  9:48 AM ------      Message from: [facility], [place]      Created: Tue Mar 29, 2013 12:00 AM       Creatinine is basically stable for him but K is high.  Stop KCl supplement and repeat BMET in 7 days. ------

## 2013-03-29 NOTE — Telephone Encounter (Signed)
Notes Recorded by Lesle Chris, LPN on 1/47/8295 at 9:47 AM Patient notified. Will fax lab order to [facility] now, he will go around July 22.

## 2013-04-12 ENCOUNTER — Encounter: Payer: Self-pay | Admitting: *Deleted

## 2013-06-15 ENCOUNTER — Ambulatory Visit: Payer: PRIVATE HEALTH INSURANCE | Admitting: Cardiovascular Disease

## 2013-06-16 ENCOUNTER — Encounter: Payer: Self-pay | Admitting: Cardiovascular Disease

## 2013-06-16 ENCOUNTER — Ambulatory Visit (INDEPENDENT_AMBULATORY_CARE_PROVIDER_SITE_OTHER): Payer: PRIVATE HEALTH INSURANCE | Admitting: Cardiovascular Disease

## 2013-06-16 VITALS — BP 106/66 | HR 71 | Ht 71.0 in | Wt 202.0 lb

## 2013-06-16 DIAGNOSIS — Z95810 Presence of automatic (implantable) cardiac defibrillator: Secondary | ICD-10-CM

## 2013-06-16 DIAGNOSIS — I5023 Acute on chronic systolic (congestive) heart failure: Secondary | ICD-10-CM

## 2013-06-16 DIAGNOSIS — N182 Chronic kidney disease, stage 2 (mild): Secondary | ICD-10-CM

## 2013-06-16 DIAGNOSIS — E785 Hyperlipidemia, unspecified: Secondary | ICD-10-CM

## 2013-06-16 DIAGNOSIS — I428 Other cardiomyopathies: Secondary | ICD-10-CM

## 2013-06-16 DIAGNOSIS — I2510 Atherosclerotic heart disease of native coronary artery without angina pectoris: Secondary | ICD-10-CM

## 2013-06-16 DIAGNOSIS — I6529 Occlusion and stenosis of unspecified carotid artery: Secondary | ICD-10-CM

## 2013-06-16 DIAGNOSIS — I519 Heart disease, unspecified: Secondary | ICD-10-CM

## 2013-06-16 DIAGNOSIS — I2589 Other forms of chronic ischemic heart disease: Secondary | ICD-10-CM

## 2013-06-16 DIAGNOSIS — I509 Heart failure, unspecified: Secondary | ICD-10-CM

## 2013-06-16 DIAGNOSIS — Z79899 Other long term (current) drug therapy: Secondary | ICD-10-CM

## 2013-06-16 DIAGNOSIS — I10 Essential (primary) hypertension: Secondary | ICD-10-CM

## 2013-06-16 DIAGNOSIS — I5022 Chronic systolic (congestive) heart failure: Secondary | ICD-10-CM

## 2013-06-16 NOTE — Progress Notes (Signed)
Patient ID: Adam House, male   DOB: [birth year], 62 y.o.   MRN: [id number]      SUBJECTIVE: Adam House is a 62 yo with a history of CAD and ischemic cardiomyopathy who presents for cardiology followup. I take care of his father as well. He had an anterolateral MI in 8/13 treated with overlapping Promus stents in the LAD. EF 25-30% on last echo. He had post-MI pericarditis as well as acute on chronic systolic CHF during 8/13 admission and was diuresed 15 lbs. He was re-admitted in 12/13 with acute on chronic systolic CHF and diuresed. He felt much better with diuresis. Of note, TnI was 1.7 in the hospital in 12/13 but he had no chest pain. This was attributed to demand ischemia in the setting of systolic CHF/volume overload + CKD.   Since his last appointment with Adam House in 03/15/2013, he has been stable and feels a bit better. No dyspnea walking on flat ground with his dog. He has mild dyspnea walking up an incline. No orthopnea or PND. No chest pain. Weight is down 4 lbs. He stopped smoking cigarettes and now uses snuff, and has started using the nicotine patch.   Labs on 04-11-13: BUN/creatinine: 10/1.37 K: 3.5  He eats baked foods, avoids fried and salty foods altogether, and rarely eats red meat.  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   Allergies  Allergen Reactions  . Lisinopril Anaphylaxis and Swelling    angioedema  . Naproxen Hives    Current Outpatient Prescriptions  Medication Sig Dispense Refill  . albuterol (PROVENTIL HFA;VENTOLIN HFA) 108 (90 BASE) MCG/ACT inhaler Inhale 2 puffs into the lungs every 6 (six) hours as needed. For shortness of breath      . allopurinol (ZYLOPRIM) 100 MG tablet Take 100 mg by mouth daily.      [name] aspirin 81 MG tablet Take 81 mg by mouth daily.      . clopidogrel (PLAVIX) 75 MG tablet Take 75 mg by mouth daily.      . colchicine 0.6 MG tablet Take 1 tablet (0.6 mg total) by mouth  daily as needed. For gout  30 tablet  11  . digoxin (LANOXIN) 0.125 MG tablet Take 1 tablet (0.125 mg total) by mouth daily.  30 tablet  3  . fish oil-omega-3 fatty acids 1000 MG capsule Take 2 g by mouth daily.      . furosemide (LASIX) 40 MG tablet Take 1.5 tablets (60 mg total) by mouth 2 (two) times daily.      . hydrALAZINE (APRESOLINE) 25 MG tablet Take 3 tablets (75 mg total) by mouth 3 (three) times daily.  270 tablet  6  . isosorbide dinitrate (DILATRATE-SR) 40 MG CR capsule Take 1 capsule (40 mg total) by mouth 3 (three) times daily.  90 capsule  6  . losartan (COZAAR) 25 MG tablet Take 1 tablet (25 mg total) by mouth daily.  30 tablet  6  . metoprolol succinate (TOPROL-XL) 200 MG 24 hr tablet Take 1 tablet (200 mg total) by mouth daily.  30 tablet  6  . multivitamin (ONE-A-DAY MEN'S) TABS Take 1 tablet by mouth daily.      . nicotine (NICODERM CQ - DOSED IN MG/24 HOURS) 14 mg/24hr patch Place 1 patch onto the skin daily.      . nitroGLYCERIN (NITROSTAT) 0.4 MG SL tablet Place 1 tablet (0.4 mg total) under the tongue every  5 (five) minutes as needed for chest pain (up to 3 doses).      . pantoprazole (PROTONIX) 40 MG tablet Take 40 mg by mouth as needed.      . rosuvastatin (CRESTOR) 40 MG tablet Take 1 tablet (40 mg total) by mouth every evening.  30 tablet  6  . spironolactone (ALDACTONE) 25 MG tablet Take 25 mg by mouth daily.       No current facility-administered medications for this visit.    Past Medical History  Diagnosis Date  . CAD (coronary artery disease)     a. ant MI 1998 with PCI to LAD. b. Ant-lat MI 04/2012 tx with overlapping DES to LAD.  [name] Systolic CHF     NYHA Class II/III  . Ischemic cardiomyopathy     EF 25-30% by echo 04/2012; [employer] ICD  . CRI (chronic renal insufficiency)   . Unspecified essential hypertension   . Impotence of organic origin   . Esophageal reflux   . Tobacco use disorder   . ICD (implantable cardiac defibrillator) in place     . Hypercholesteremia   . Heart murmur   . CVA (cerebral infarction) 11/2010  . Gout   . 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 Anterior cervical decomp/discectomy fusion  2007    "put in 4 screws to hold my head on; ruptured C5; job related injury" (08/24/2012)  . Cardiac defibrillator placement  2011    [employer]  he is a [facility] study patient  . Coronary angioplasty with stent placement  05/1997; 04/2012    "1 + 2; total of 3" (08/24/2012)    History   Social History  . Marital Status: Married    Spouse Name: N/A    Number of Children: N/A  . Years of Education: N/A   Occupational History  . RETIRED    Social History Main Topics  . Smoking status: Former Smoker -- 0.50 packs/day for 40 years    Types: Cigarettes  . Smokeless tobacco: Current User    Types: Snuff     Comment: may smoke one here or there depends on stress; started nicotine patch on 04-29-13, and uses snuff also  . Alcohol Use: Yes     Comment: 08/24/2012 "used to drink; not anymore; stopped > 50yrs ago; never had any problems w/it"  . Drug Use: Yes    Special: Marijuana     Comment: 08/24/2012 "last drug use >4-5 months ago"  . Sexual Activity: Yes   Other Topics Concern  . Not on file   Social History Narrative   Last updated: 03/15/2010   Retired and lives in [place].  Previously worked for [employer].    Tobacco Use - 1PPD x 40+ years,  has quit x 3 days   ETOH- rare   Drugs- occasional marijuana            Filed Vitals:   06/16/13 1050  BP: 106/66  Pulse: 71  Height: 5\' 11"  (1.803 m)  Weight: 202 lb (91.627 kg)    PHYSICAL EXAM General: NAD Neck: No JVD, no thyromegaly or thyroid nodule.  Lungs: Clear to auscultation bilaterally with normal respiratory effort. CV: Nondisplaced PMI.  Heart regular S1/S2, no S3/S4, no murmur.  No peripheral edema.  No carotid bruit.  Normal pedal pulses.  Abdomen: Soft, nontender, no  hepatosplenomegaly, no distention.  Neurologic: Alert and oriented x 3.  Psych: Normal affect. Extremities: No clubbing or cyanosis.   ECG: reviewed and available in electronic records.      ASSESSMENT AND PLAN: 1. CAD: Anterolateral STEMI in 8/13 with overlapping DES to LAD. No chest pain. During CHF admission in 12/13, troponin was elevated to 1.7 with no chest pain. Suspect this was demand ischemia due to volume overload. Continue ASA 81, Plavix, statin, Toprol XL.  2. Chronic systolic CHF: Adam House looks near-euvolemic on exam. He has NYHA class II symptoms. He is not on ACEI due to history of angioedema.  - Continue losartan, digoxin, hydralazine/isordil, and spironolactone 25.  - Continue Lasix 60 mg bid.  - Continue Toprol XL to 200 mg daily.  - BMET within a week - Followup 4 months.  3. Hyperlipidemia: Continue Crestor. Lipids not ideal but he is on highest dose of Crestor.  4. Smoking: he has now quit and uses nicotine patches, albeit he is using snuff. 5. Carotid stenosis: Chronic RICA occlusion with minimal LICA disease. Needs to stay on statin, ASA, Plavix and will repeat carotids in 1/15.  6. CKD: BMET within the week.     Prentice Docker, M.D., F.A.C.C.

## 2013-06-16 NOTE — Patient Instructions (Signed)
Continue all current medications. Lab for [facility] will contact with results via phone or letter.   Your physician wants you to follow up in:  4 months.  You will receive a reminder letter in the mail one-two months in advance.  If you don't receive a letter, please call our office to schedule the follow up appointment

## 2013-06-23 ENCOUNTER — Encounter: Payer: Self-pay | Admitting: Internal Medicine

## 2013-06-23 ENCOUNTER — Ambulatory Visit (INDEPENDENT_AMBULATORY_CARE_PROVIDER_SITE_OTHER): Payer: PRIVATE HEALTH INSURANCE | Admitting: *Deleted

## 2013-06-23 DIAGNOSIS — I428 Other cardiomyopathies: Secondary | ICD-10-CM

## 2013-06-23 DIAGNOSIS — Z95810 Presence of automatic (implantable) cardiac defibrillator: Secondary | ICD-10-CM

## 2013-06-23 LAB — REMOTE ICD DEVICE
AL AMPLITUDE: 7.1 mv
AL IMPEDENCE ICD: 532 Ohm
ATRIAL PACING ICD: 0 pct
DEV-0020ICD: NEGATIVE
DEVICE MODEL ICD: 147011
HV IMPEDENCE: 53 Ohm
RV LEAD IMPEDENCE ICD: 474 Ohm
TZAT-0001FASTVT: 2
TZAT-0001SLOWVT: 1
TZAT-0013FASTVT: 4
TZAT-0013SLOWVT: 4
TZAT-0013SLOWVT: 4
TZAT-0018FASTVT: NEGATIVE
TZAT-0018SLOWVT: NEGATIVE
TZON-0003FASTVT: 300 ms
TZST-0001FASTVT: 3
TZST-0001FASTVT: 6
TZST-0001FASTVT: 7
TZST-0001FASTVT: 8
TZST-0001SLOWVT: 3
TZST-0001SLOWVT: 4
TZST-0001SLOWVT: 5
TZST-0001SLOWVT: 6
TZST-0001SLOWVT: 7
TZST-0003FASTVT: 41 J
TZST-0003FASTVT: 41 J
TZST-0003FASTVT: 41 J
TZST-0003SLOWVT: 41 J
TZST-0003SLOWVT: 41 J
TZST-0003SLOWVT: 41 J
VENTRICULAR PACING ICD: 0 pct

## 2013-07-04 ENCOUNTER — Other Ambulatory Visit: Payer: Self-pay | Admitting: Cardiovascular Disease

## 2013-07-04 LAB — BASIC METABOLIC PANEL WITH GFR
BUN: 14 mg/dL (ref 6–23)
CO2: 26 meq/L (ref 19–32)
Calcium: 9.4 mg/dL (ref 8.4–10.5)
Chloride: 101 meq/L (ref 96–112)
Creat: 1.66 mg/dL — ABNORMAL HIGH (ref 0.50–1.35)
Glucose, Bld: 91 mg/dL (ref 70–99)
Potassium: 3.7 meq/L (ref 3.5–5.3)
Sodium: 136 meq/L (ref 135–145)

## 2013-07-05 ENCOUNTER — Encounter: Payer: Self-pay | Admitting: *Deleted

## 2013-07-07 ENCOUNTER — Encounter: Payer: Self-pay | Admitting: Internal Medicine

## 2013-07-07 ENCOUNTER — Ambulatory Visit (INDEPENDENT_AMBULATORY_CARE_PROVIDER_SITE_OTHER): Payer: PRIVATE HEALTH INSURANCE | Admitting: Internal Medicine

## 2013-07-07 VITALS — BP 132/82 | HR 86 | Ht 69.0 in | Wt 204.0 lb

## 2013-07-07 DIAGNOSIS — I519 Heart disease, unspecified: Secondary | ICD-10-CM

## 2013-07-07 DIAGNOSIS — I472 Ventricular tachycardia, unspecified: Secondary | ICD-10-CM

## 2013-07-07 DIAGNOSIS — Z79899 Other long term (current) drug therapy: Secondary | ICD-10-CM

## 2013-07-07 DIAGNOSIS — I2589 Other forms of chronic ischemic heart disease: Secondary | ICD-10-CM

## 2013-07-07 DIAGNOSIS — I10 Essential (primary) hypertension: Secondary | ICD-10-CM

## 2013-07-07 DIAGNOSIS — I428 Other cardiomyopathies: Secondary | ICD-10-CM

## 2013-07-07 DIAGNOSIS — I2510 Atherosclerotic heart disease of native coronary artery without angina pectoris: Secondary | ICD-10-CM

## 2013-07-07 MED ORDER — POTASSIUM CHLORIDE CRYS ER 20 MEQ PO TBCR: 20.0000 meq | EXTENDED_RELEASE_TABLET | Freq: Every day | ORAL | Status: DC

## 2013-07-07 NOTE — Progress Notes (Signed)
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  Since last being seen in our clinic, the patient reports doing very well.  Recent remote check revealed multiple episodes of NSVT which is new.  He reports brief tachypalpitations at times with presyncope. Today, he denies symptoms of palpitations, chest pain, shortness of breath,  lower extremity edema, dizziness,  syncope, or ICD shocks.  The patient is otherwise without complaint today.   Past Medical History  Diagnosis Date  . CAD (coronary artery disease)     a. ant MI 1998 with PCI to LAD. b. Ant-lat MI 04/2012 tx with overlapping DES to LAD.  [name] Systolic CHF     NYHA Class II/III  . Ischemic cardiomyopathy     EF 25-30% by echo 04/2012; [employer] ICD  . CRI (chronic renal insufficiency)   . Unspecified essential hypertension   . Impotence of organic origin   . Esophageal reflux   . Tobacco use disorder   . ICD (implantable cardiac defibrillator) in place   . Hypercholesteremia   . Heart murmur   . CVA (cerebral infarction) 11/2010  . Gout   . Post-MI pericarditis     04/2012   Past Surgical History  Procedure Laterality Date  . Anterior cervical decomp/discectomy fusion  2007    "put in 4 screws to hold my head on; ruptured C5; job related injury" (08/24/2012)  . Cardiac defibrillator placement  2011    [employer]  he is a [facility] R[facility] study patient  . Coronary angioplasty with stent placement  05/1997; 04/2012    "1 + 2; total of 3" (08/24/2012)    Current Outpatient Prescriptions  Medication Sig Dispense Refill  . albuterol (PROVENTIL HFA;VENTOLIN HFA) 108 (90 BASE) MCG/ACT inhaler Inhale 2 puffs into the lungs every 6 (six) hours as needed. For shortness of breath      . allopurinol (ZYLOPRIM) 100 MG tablet Take 100 mg by mouth daily.      [name] aspirin 81 MG tablet Take 81 mg by mouth daily.      . clopidogrel (PLAVIX)  75 MG tablet Take 75 mg by mouth daily.      . colchicine 0.6 MG tablet Take 1 tablet (0.6 mg total) by mouth daily as needed. For gout  30 tablet  11  . digoxin (LANOXIN) 0.125 MG tablet Take 1 tablet (0.125 mg total) by mouth daily.  30 tablet  3  . fish oil-omega-3 fatty acids 1000 MG capsule Take 2 g by mouth daily.      . furosemide (LASIX) 40 MG tablet Take 1.5 tablets (60 mg total) by mouth 2 (two) times daily.      . hydrALAZINE (APRESOLINE) 25 MG tablet Take 3 tablets (75 mg total) by mouth 3 (three) times daily.  270 tablet  6  . isosorbide dinitrate (DILATRATE-SR) 40 MG CR capsule Take 1 capsule (40 mg total) by mouth 3 (three) times daily.  90 capsule  6  . losartan (COZAAR) 25 MG tablet Take 1 tablet (25 mg total) by mouth daily.  30 tablet  6  . metoprolol succinate (TOPROL-XL) 200 MG 24 hr tablet Take 1 tablet (200 mg total) by mouth daily.  30 tablet  6  . multivitamin (ONE-A-DAY MEN'S) TABS Take 1 tablet by mouth daily.      . nicotine (NICODERM CQ - DOSED IN MG/24 HOURS) 14 mg/24hr patch Place 1 patch  onto the skin daily.      . nitroGLYCERIN (NITROSTAT) 0.4 MG SL tablet Place 1 tablet (0.4 mg total) under the tongue every 5 (five) minutes as needed for chest pain (up to 3 doses).      . pantoprazole (PROTONIX) 40 MG tablet Take 40 mg by mouth as needed.      . rosuvastatin (CRESTOR) 40 MG tablet Take 1 tablet (40 mg total) by mouth every evening.  30 tablet  6  . spironolactone (ALDACTONE) 25 MG tablet Take 25 mg by mouth daily.       No current facility-administered medications for this visit.    Physical Exam: Filed Vitals:   07/07/13 0924  BP: 132/82  Pulse: 86  Height: 5\' 9"  (1.753 m)  Weight: 204 lb (92.534 kg)    GEN- The patient is well appearing, alert and oriented x 3 today.   Head- normocephalic, atraumatic Eyes-  Sclera clear, conjunctiva pink Ears- hearing intact Oropharynx- clear Lungs- Clear to ausculation bilaterally, normal work of breathing Chest-  ICD pocket is well healed Heart- Regular rate and rhythm, no murmurs, rubs or gallops, PMI not laterally displaced GI- soft, NT, ND, + BS Extremities- no clubbing, cyanosis, or edema  ICD interrogation- reviewed in detail today,  See PACEART report  Assessment and Plan:  1. Ischemic CM/ CAD/ chronic systolic dysfunction Stable, no ischemic symptoms, euvolemic  Normal ICD function See Pace Art report No changes today  2. VT He has had multiple episodes of NSVT and a single episode of sustained VT for which he received an appropriate ATP treatment.  The VT has a variable cycle length from 400-->270 msec.   He already takes toprol 200mg  daily and reports compliance I will obtain an echo to evaluate for structural changes bmet last week revealed K 3.7 Start KDur 20 meq daily bmet, mg, tfts, and dig level in 10 days No driving (pt instructed today)  3. HTN Stable No change required today  [facility] Return in 2 months

## 2013-07-07 NOTE — Patient Instructions (Signed)
Your physician recommends that you schedule a follow-up appointment in: 2 months with Dr. Johney Frame. This appointment will be scheduled today before you leave.  Your physician recommends that you return for lab work in: 10 days (Around 07-18-13)  for BMET, TSH, T4, Digoxin level, and Magnesium.  Your physician has recommended you make the following change in your medication:  Start: Klor Con 20 MEQ once daily. A prescription has been sent to your pharmacy.  Continue all other medications the same.

## 2013-07-08 LAB — ICD DEVICE OBSERVATION
AL IMPEDENCE ICD: 545 Ohm
AL THRESHOLD: 1 V
ATRIAL PACING ICD: 1 pct
CHARGE TIME: 9 s
DEV-0020ICD: NEGATIVE
DEVICE MODEL ICD: 147011
HV IMPEDENCE: 58 Ohm
RV LEAD IMPEDENCE ICD: 483 Ohm
TZAT-0001FASTVT: 1
TZAT-0001SLOWVT: 1
TZAT-0013FASTVT: 4
TZAT-0013FASTVT: 4
TZAT-0013SLOWVT: 4
TZAT-0018FASTVT: NEGATIVE
TZAT-0018FASTVT: NEGATIVE
TZAT-0018SLOWVT: NEGATIVE
TZON-0003FASTVT: 300 ms
TZON-0003SLOWVT: 352.9 ms
TZST-0001FASTVT: 4
TZST-0001FASTVT: 5
TZST-0001FASTVT: 6
TZST-0001FASTVT: 8
TZST-0001SLOWVT: 3
TZST-0001SLOWVT: 6
TZST-0001SLOWVT: 7
TZST-0003FASTVT: 41 J
TZST-0003FASTVT: 41 J
TZST-0003FASTVT: 41 J
TZST-0003SLOWVT: 41 J
TZST-0003SLOWVT: 41 J
VENTRICULAR PACING ICD: 1 pct

## 2013-07-21 ENCOUNTER — Other Ambulatory Visit: Payer: Self-pay | Admitting: Internal Medicine

## 2013-07-21 LAB — BASIC METABOLIC PANEL
BUN: 13 mg/dL (ref 6–23)
CO2: 23 mEq/L (ref 19–32)
Calcium: 9.3 mg/dL (ref 8.4–10.5)
Creat: 1.34 mg/dL (ref 0.50–1.35)
Glucose, Bld: 105 mg/dL — ABNORMAL HIGH (ref 70–99)

## 2013-07-21 LAB — MAGNESIUM: Magnesium: 1.8 mg/dL (ref 1.5–2.5)

## 2013-07-22 LAB — DIGOXIN LEVEL: Digoxin Level: 0.4 ng/mL — ABNORMAL LOW (ref 0.8–2.0)

## 2013-08-17 ENCOUNTER — Telehealth: Payer: Self-pay | Admitting: Cardiovascular Disease

## 2013-08-17 NOTE — Telephone Encounter (Signed)
pantoprazole 40mg  call into [facility]

## 2013-08-19 ENCOUNTER — Other Ambulatory Visit: Payer: Self-pay | Admitting: Cardiology

## 2013-08-19 MED ORDER — PANTOPRAZOLE SODIUM 40 MG PO TBEC: 40.0000 mg | DELAYED_RELEASE_TABLET | ORAL | Status: DC | PRN

## 2013-08-19 NOTE — Telephone Encounter (Signed)
Patient calling due to RX not being called into [facility] and needs medication today.

## 2013-08-19 NOTE — Telephone Encounter (Signed)
Already done

## 2013-08-25 ENCOUNTER — Other Ambulatory Visit: Payer: Self-pay | Admitting: Cardiology

## 2013-08-25 ENCOUNTER — Encounter: Payer: Self-pay | Admitting: Internal Medicine

## 2013-08-25 ENCOUNTER — Ambulatory Visit (INDEPENDENT_AMBULATORY_CARE_PROVIDER_SITE_OTHER): Payer: PRIVATE HEALTH INSURANCE | Admitting: Internal Medicine

## 2013-08-25 VITALS — BP 85/45 | HR 117 | Ht 69.0 in | Wt 204.0 lb

## 2013-08-25 DIAGNOSIS — I472 Ventricular tachycardia: Secondary | ICD-10-CM

## 2013-08-25 DIAGNOSIS — I428 Other cardiomyopathies: Secondary | ICD-10-CM

## 2013-08-25 DIAGNOSIS — I10 Essential (primary) hypertension: Secondary | ICD-10-CM

## 2013-08-25 DIAGNOSIS — I2510 Atherosclerotic heart disease of native coronary artery without angina pectoris: Secondary | ICD-10-CM

## 2013-08-25 LAB — MDC_IDC_ENUM_SESS_TYPE_INCLINIC
Date Time Interrogation Session: 20141211050000
HighPow Impedance: 63 Ohm
Implantable Pulse Generator Serial Number: 147011
Lead Channel Impedance Value: 533 Ohm
Lead Channel Impedance Value: 579 Ohm
Lead Channel Pacing Threshold Amplitude: 0.5 V
Lead Channel Pacing Threshold Amplitude: 0.7 V
Lead Channel Pacing Threshold Pulse Width: 0.4 ms
Lead Channel Pacing Threshold Pulse Width: 0.4 ms
Lead Channel Sensing Intrinsic Amplitude: 18.1 mV
Lead Channel Setting Pacing Amplitude: 2 V
Lead Channel Setting Pacing Amplitude: 2.4 V
Lead Channel Setting Pacing Pulse Width: 0.4 ms
Zone Setting Detection Interval: 240 ms
Zone Setting Detection Interval: 353 ms

## 2013-08-25 MED ORDER — FUROSEMIDE 40 MG PO TABS: 60.0000 mg | ORAL_TABLET | Freq: Two times a day (BID) | ORAL | Status: DC

## 2013-08-25 NOTE — Patient Instructions (Signed)
Your physician recommends that you schedule a follow-up appointment in: 6 months with Dr. Johney Frame. You should receive a letter in the mail in 4 months. If you do not receive this letter by April 2015 call our office to schedule this appointment.   Home Transmission Due in March 2015.  Your physician recommends that you continue on your current medications as directed. Please refer to the Current Medication list given to you today.

## 2013-08-25 NOTE — Progress Notes (Signed)
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  Since last being seen in our clinic, the patient reports doing very well.  His NSVT has improved with increased toprol.  He appears to be tolerating this medicine. Today, he denies symptoms of palpitations, chest pain, shortness of breath,  lower extremity edema, dizziness,  syncope, or ICD shocks.  The patient is otherwise without complaint today.   Past Medical History  Diagnosis Date  . CAD (coronary artery disease)     a. ant MI 1998 with PCI to LAD. b. Ant-lat MI 04/2012 tx with overlapping DES to LAD.  [name] Systolic CHF     NYHA Class II/III  . Ischemic cardiomyopathy     EF 25-30% by echo 04/2012; [employer] ICD  . CRI (chronic renal insufficiency)   . Unspecified essential hypertension   . Impotence of organic origin   . Esophageal reflux   . Tobacco use disorder   . ICD (implantable cardiac defibrillator) in place   . Hypercholesteremia   . Heart murmur   . CVA (cerebral infarction) 11/2010  . Gout   . Post-MI pericarditis     04/2012   Past Surgical History  Procedure Laterality Date  . Anterior cervical decomp/discectomy fusion  2007    "put in 4 screws to hold my head on; ruptured C5; job related injury" (08/24/2012)  . Cardiac defibrillator placement  2011    [employer]  he is a M[facility] RIT study patient  . Coronary angioplasty with stent placement  05/1997; 04/2012    "1 + 2; total of 3" (08/24/2012)    Current Outpatient Prescriptions  Medication Sig Dispense Refill  . albuterol (PROVENTIL HFA;VENTOLIN HFA) 108 (90 BASE) MCG/ACT inhaler Inhale 2 puffs into the lungs every 6 (six) hours as needed. For shortness of breath      . allopurinol (ZYLOPRIM) 100 MG tablet Take 100 mg by mouth daily.      [name] aspirin 81 MG tablet Take 81 mg by mouth daily.      . clopidogrel (PLAVIX) 75 MG tablet Take 75 mg by mouth daily.       . colchicine 0.6 MG tablet Take 1 tablet (0.6 mg total) by mouth daily as needed. For gout  30 tablet  11  . digoxin (LANOXIN) 0.125 MG tablet Take 1 tablet (0.125 mg total) by mouth daily.  30 tablet  3  . fish oil-omega-3 fatty acids 1000 MG capsule Take 2 g by mouth daily.      . furosemide (LASIX) 40 MG tablet Take 1.5 tablets (60 mg total) by mouth 2 (two) times daily.  90 tablet  6  . hydrALAZINE (APRESOLINE) 25 MG tablet Take 3 tablets (75 mg total) by mouth 3 (three) times daily.  270 tablet  6  . isosorbide dinitrate (DILATRATE-SR) 40 MG CR capsule Take 1 capsule (40 mg total) by mouth 3 (three) times daily.  90 capsule  6  . losartan (COZAAR) 25 MG tablet Take 1 tablet (25 mg total) by mouth daily.  30 tablet  6  . metoprolol succinate (TOPROL-XL) 200 MG 24 hr tablet Take 1 tablet (200 mg total) by mouth daily.  30 tablet  6  . multivitamin (ONE-A-DAY MEN'S) TABS Take 1 tablet by mouth daily.      . nicotine (NICODERM CQ - DOSED IN MG/24 HOURS) 14 mg/24hr patch Place 1 patch onto the skin daily.      [name]  nitroGLYCERIN (NITROSTAT) 0.4 MG SL tablet Place 1 tablet (0.4 mg total) under the tongue every 5 (five) minutes as needed for chest pain (up to 3 doses).      . pantoprazole (PROTONIX) 40 MG tablet Take 1 tablet (40 mg total) by mouth as needed.  30 tablet  6  . potassium chloride SA (KLOR-CON M20) 20 MEQ tablet Take 1 tablet (20 mEq total) by mouth daily.  30 tablet  6  . rosuvastatin (CRESTOR) 40 MG tablet Take 1 tablet (40 mg total) by mouth every evening.  30 tablet  6  . spironolactone (ALDACTONE) 25 MG tablet Take 25 mg by mouth daily.       No current facility-administered medications for this visit.    Physical Exam: Filed Vitals:   08/25/13 1151 08/25/13 1205  BP: 91/61 85/45  Pulse: 116 117  Height: 5\' 9"  (1.753 m)   Weight: 204 lb (92.534 kg)   SpO2: 97% 98%    GEN- The patient is well appearing, alert and oriented x 3 today.   Head- normocephalic,  atraumatic Eyes-  Sclera clear, conjunctiva pink Ears- hearing intact Oropharynx- clear Lungs- Clear to ausculation bilaterally, normal work of breathing Chest- ICD pocket is well healed Heart- Regular rate and rhythm, no murmurs, rubs or gallops, PMI not laterally displaced GI- soft, NT, ND, + BS Extremities- no clubbing, cyanosis, or edema  ICD interrogation- reviewed in detail today,  See PACEART report  Assessment and Plan:  1. Ischemic CM/ CAD/ chronic systolic dysfunction Stable, no ischemic symptoms, euvolemic  Normal ICD function See Pace Art report No changes today  2. VT Improved with increased toprol No changes today  3. HTN Stable No change required today  [place] Return in 6 months He hopes to drive his [id number] when he returns for me to see.

## 2013-09-06 ENCOUNTER — Other Ambulatory Visit (HOSPITAL_COMMUNITY): Payer: Self-pay | Admitting: Physician Assistant

## 2013-09-06 NOTE — Telephone Encounter (Signed)
Does Dr Johney Frame fill this medication for this patient? Please advise. Thanks, MI

## 2013-09-20 ENCOUNTER — Other Ambulatory Visit (HOSPITAL_COMMUNITY): Payer: Self-pay | Admitting: Physician Assistant

## 2013-09-20 ENCOUNTER — Other Ambulatory Visit (HOSPITAL_COMMUNITY): Payer: Self-pay | Admitting: Internal Medicine

## 2013-09-20 ENCOUNTER — Other Ambulatory Visit: Payer: Self-pay | Admitting: Physician Assistant

## 2013-09-29 ENCOUNTER — Telehealth: Payer: Self-pay | Admitting: Cardiovascular Disease

## 2013-09-29 ENCOUNTER — Encounter: Payer: Self-pay | Admitting: Cardiovascular Disease

## 2013-09-29 DIAGNOSIS — R0989 Other specified symptoms and signs involving the circulatory and respiratory systems: Secondary | ICD-10-CM

## 2013-09-29 DIAGNOSIS — I6529 Occlusion and stenosis of unspecified carotid artery: Secondary | ICD-10-CM

## 2013-09-29 NOTE — Telephone Encounter (Signed)
Adam House had cartoid dopplers on 09-16-12. He has appt at end of month. Does he Need to have cartoid's repeated before office visit.

## 2013-09-29 NOTE — Telephone Encounter (Signed)
Yes, he is due 09/2013.  I will put order in for you to schedule.

## 2013-10-12 ENCOUNTER — Ambulatory Visit (INDEPENDENT_AMBULATORY_CARE_PROVIDER_SITE_OTHER): Payer: PRIVATE HEALTH INSURANCE | Admitting: Cardiovascular Disease

## 2013-10-12 ENCOUNTER — Encounter: Payer: Self-pay | Admitting: Cardiovascular Disease

## 2013-10-12 VITALS — BP 124/77 | HR 99 | Ht 69.0 in | Wt 205.0 lb

## 2013-10-12 DIAGNOSIS — F17200 Nicotine dependence, unspecified, uncomplicated: Secondary | ICD-10-CM

## 2013-10-12 DIAGNOSIS — I2510 Atherosclerotic heart disease of native coronary artery without angina pectoris: Secondary | ICD-10-CM

## 2013-10-12 DIAGNOSIS — I4729 Other ventricular tachycardia: Secondary | ICD-10-CM

## 2013-10-12 DIAGNOSIS — N182 Chronic kidney disease, stage 2 (mild): Secondary | ICD-10-CM

## 2013-10-12 DIAGNOSIS — Z79899 Other long term (current) drug therapy: Secondary | ICD-10-CM

## 2013-10-12 DIAGNOSIS — I509 Heart failure, unspecified: Secondary | ICD-10-CM

## 2013-10-12 DIAGNOSIS — I472 Ventricular tachycardia, unspecified: Secondary | ICD-10-CM

## 2013-10-12 DIAGNOSIS — Z95810 Presence of automatic (implantable) cardiac defibrillator: Secondary | ICD-10-CM

## 2013-10-12 DIAGNOSIS — I6529 Occlusion and stenosis of unspecified carotid artery: Secondary | ICD-10-CM

## 2013-10-12 DIAGNOSIS — E785 Hyperlipidemia, unspecified: Secondary | ICD-10-CM

## 2013-10-12 DIAGNOSIS — I5022 Chronic systolic (congestive) heart failure: Secondary | ICD-10-CM

## 2013-10-12 DIAGNOSIS — Z720 Tobacco use: Secondary | ICD-10-CM

## 2013-10-12 DIAGNOSIS — I2589 Other forms of chronic ischemic heart disease: Secondary | ICD-10-CM

## 2013-10-12 DIAGNOSIS — I10 Essential (primary) hypertension: Secondary | ICD-10-CM

## 2013-10-12 NOTE — Patient Instructions (Signed)
Continue all current medications. Your physician wants you to follow up in: 6 months.  You will receive a reminder letter in the mail one-two months in advance.  If you don't receive a letter, please call our office to schedule the follow up appointment   

## 2013-10-12 NOTE — Progress Notes (Signed)
Patient ID: Adam House, male   DOB: April 18, 1951, 63 y.o.   MRN: [id number]      SUBJECTIVE: Adam House is a 63 yo with a history of CAD and ischemic cardiomyopathy who presents for cardiology followup. I take care of his father as well.  He had an anterolateral MI in 8/13 treated with overlapping Promus stents in the LAD. EF 25-30% on last echo. He had post-MI pericarditis as well as acute on chronic systolic CHF during 8/10 admission. On 07/21/13, BUN 13, creatinine 1.34. TSH was normal. Digoxin level was low.  He is doing very well and denies chest pain, shortness of breath, lightheadedness, dizziness, palpitations, orthopnea, PND and syncope. He's been struggling with gout of his left ankle since Christmas and had a flare again yesterday and had to go to the [facility] ED for a cortisone injection. He lifts weights at home to stay in shape.  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     Allergies  Allergen Reactions  . Lisinopril Anaphylaxis and Swelling    angioedema  . Naproxen Hives    Current Outpatient Prescriptions  Medication Sig Dispense Refill  . albuterol (PROVENTIL HFA;VENTOLIN HFA) 108 (90 BASE) MCG/ACT inhaler Inhale 2 puffs into the lungs every 6 (six) hours as needed. For shortness of breath      . allopurinol (ZYLOPRIM) 100 MG tablet Take 100 mg by mouth daily.      [name] aspirin 81 MG tablet Take 81 mg by mouth daily.      . clopidogrel (PLAVIX) 75 MG tablet Take 75 mg by mouth daily.      . colchicine 0.6 MG tablet Take 1 tablet (0.6 mg total) by mouth daily as needed. For gout  30 tablet  11  . digoxin (LANOXIN) 0.125 MG tablet TAKE (1) TABLET BY MOUTH ONCE DAILY.  30 tablet  0  . fish oil-omega-3 fatty acids 1000 MG capsule Take 2 g by mouth daily.      . furosemide (LASIX) 40 MG tablet Take 1.5 tablets (60 mg total) by mouth 2 (two) times daily.  90 tablet  6  . hydrALAZINE (APRESOLINE) 25 MG  tablet Take 3 tablets (75 mg total) by mouth 3 (three) times daily.  270 tablet  6  . HYDROcodone-acetaminophen (NORCO/VICODIN) 5-325 MG per tablet Take 1-2 tablets by mouth every 6 (six) hours as needed for moderate pain.      . indomethacin (INDOCIN) 25 MG capsule Take 25 mg by mouth every 6 (six) hours.      . isosorbide dinitrate (DILATRATE-SR) 40 MG CR capsule Take 1 capsule (40 mg total) by mouth 3 (three) times daily.  90 capsule  6  . losartan (COZAAR) 25 MG tablet Take 1 tablet (25 mg total) by mouth daily.  30 tablet  6  . metoprolol succinate (TOPROL-XL) 200 MG 24 hr tablet Take 1 tablet (200 mg total) by mouth daily.  30 tablet  6  . multivitamin (ONE-A-DAY MEN'S) TABS Take 1 tablet by mouth daily.      . nicotine (NICODERM CQ - DOSED IN MG/24 HOURS) 14 mg/24hr patch Place 1 patch onto the skin daily.      . nitroGLYCERIN (NITROSTAT) 0.4 MG SL tablet Place 1 tablet (0.4 mg total) under the tongue every 5 (five) minutes as needed for chest pain (up to 3 doses).      . pantoprazole (PROTONIX) 40  MG tablet Take 1 tablet (40 mg total) by mouth as needed.  30 tablet  6  . potassium chloride SA (KLOR-CON M20) 20 MEQ tablet Take 1 tablet (20 mEq total) by mouth daily.  30 tablet  6  . predniSONE (DELTASONE) 20 MG tablet Take 20 mg by mouth as directed.      . rosuvastatin (CRESTOR) 40 MG tablet Take 1 tablet (40 mg total) by mouth every evening.  30 tablet  6  . spironolactone (ALDACTONE) 25 MG tablet Take 25 mg by mouth daily.       No current facility-administered medications for this visit.    Past Medical History  Diagnosis Date  . CAD (coronary artery disease)     a. ant MI 1998 with PCI to LAD. b. Ant-lat MI 04/2012 tx with overlapping DES to LAD.  [name] Systolic CHF     NYHA Class II/III  . Ischemic cardiomyopathy     EF 25-30% by echo 04/2012; [place]  . CRI (chronic renal insufficiency)   . Unspecified essential hypertension   . Impotence of organic origin   .  Esophageal reflux   . Tobacco use disorder   . ICD (implantable cardiac defibrillator) in place   . Hypercholesteremia   . Heart murmur   . CVA (cerebral infarction) 11/2010  . Gout   . 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 Anterior cervical decomp/discectomy fusion  2007    "put in 4 screws to hold my head on; ruptured C5; job related injury" (08/24/2012)  . Cardiac defibrillator placement  2011    [employer]  he is a MAD[facility] RIT study patient  . Coronary angioplasty with stent placement  05/1997; 04/2012    "1 + 2; total of 3" (08/24/2012)    History   Social History  . Marital Status: Married    Spouse Name: N/A    Number of Children: N/A  . Years of Education: N/A   Occupational History  . RETIRED    Social History Main Topics  . Smoking status: Current Every Day Smoker -- 0.50 packs/day for 40 years    Types: Cigarettes  . Smokeless tobacco: Current User    Types: Snuff     Comment: may smoke one here or there depends on stress; started nicotine patch on 04-29-13, and uses snuff also  . Alcohol Use: Yes     Comment: 08/24/2012 "used to drink; not anymore; stopped > 62yrs ago; never had any problems w/it"  . Drug Use: Yes    Special: Marijuana     Comment: 08/24/2012 "last drug use >4-5 months ago"  . Sexual Activity: Yes   Other Topics Concern  . Not on file   Social History Narrative   Last updated: 03/15/2010   Retired and lives in [place].  Previously worked for [employer].    Tobacco Use - 1PPD x 40+ years,  has quit x 3 days   ETOH- rare   Drugs- occasional marijuana            Filed Vitals:   10/12/13 0856  BP: 124/77  Pulse: 99  Height: 5\' 9"  (1.753 m)  Weight: 205 lb (92.987 kg)    PHYSICAL EXAM General: NAD Neck: No JVD, no thyromegaly or thyroid nodule.  Lungs: Clear to auscultation bilaterally with normal respiratory effort. CV: Nondisplaced PMI.  Heart regular S1/S2, no S3/S4,  no murmur.  No  peripheral edema.  No carotid bruit.  Normal pedal pulses.  Abdomen: Soft, nontender, no hepatosplenomegaly, no distention.  Neurologic: Alert and oriented x 3.  Psych: Normal affect. Extremities: No clubbing or cyanosis.   ECG: reviewed and available in electronic records.      ASSESSMENT AND PLAN: 1. CAD: Anterolateral STEMI in 8/13 with overlapping DES to LAD. No chest pain. Continue ASA 81 mg daily, Plavix, statin (Crestor 40 mg daily), Toprol XL.  2. Chronic systolic CHF with ICD: appears euvolemic. He is not on ACEI due to history of angioedema.  - Continue losartan, digoxin, hydralazine/isordil, and spironolactone 25 mg daily.  - Continue Lasix 60 mg bid.  - Continue Toprol XL 200 mg daily.  3. Hyperlipidemia: Continue Crestor 40 mg hs. 4. Smoking: he has now quit and uses nicotine patches, albeit he is using snuff.  5. Carotid stenosis: Chronic RICA occlusion with minimal LICA disease. Needs to stay on statin, ASA, Plavix and will get repeat carotid imaging on 10/13/13. 6. CKD: continue to monitor.  Dispo: f/u 6 months.  Kate Sable, M.D., F.A.C.C.

## 2013-10-13 ENCOUNTER — Encounter (INDEPENDENT_AMBULATORY_CARE_PROVIDER_SITE_OTHER): Payer: PRIVATE HEALTH INSURANCE

## 2013-10-13 DIAGNOSIS — I6529 Occlusion and stenosis of unspecified carotid artery: Secondary | ICD-10-CM

## 2013-10-27 ENCOUNTER — Telehealth: Payer: Self-pay | Admitting: *Deleted

## 2013-10-27 DIAGNOSIS — R0989 Other specified symptoms and signs involving the circulatory and respiratory systems: Secondary | ICD-10-CM

## 2013-10-27 DIAGNOSIS — I739 Peripheral vascular disease, unspecified: Secondary | ICD-10-CM

## 2013-10-27 DIAGNOSIS — I779 Disorder of arteries and arterioles, unspecified: Secondary | ICD-10-CM

## 2013-10-27 NOTE — Telephone Encounter (Signed)
Message copied by Merlene Laughter on Thu Oct 27, 2013  4:42 PM ------      Message from: Kate Sable A      Created: Mon Oct 17, 2013  6:38 PM       If he is not followed by vascular surgery, would have him do so. ------

## 2013-10-28 DIAGNOSIS — I779 Disorder of arteries and arterioles, unspecified: Secondary | ICD-10-CM | POA: Insufficient documentation

## 2013-10-28 DIAGNOSIS — I739 Peripheral vascular disease, unspecified: Secondary | ICD-10-CM

## 2013-10-28 NOTE — Telephone Encounter (Signed)
Patient informed. 

## 2013-12-01 ENCOUNTER — Encounter: Payer: Self-pay | Admitting: Internal Medicine

## 2013-12-01 ENCOUNTER — Ambulatory Visit (INDEPENDENT_AMBULATORY_CARE_PROVIDER_SITE_OTHER): Payer: PRIVATE HEALTH INSURANCE | Admitting: *Deleted

## 2013-12-01 DIAGNOSIS — I428 Other cardiomyopathies: Secondary | ICD-10-CM

## 2013-12-01 LAB — MDC_IDC_ENUM_SESS_TYPE_REMOTE
HighPow Impedance: 59 Ohm
Lead Channel Impedance Value: 490 Ohm
Lead Channel Impedance Value: 580 Ohm
Lead Channel Sensing Intrinsic Amplitude: 13.5 mV
Lead Channel Sensing Intrinsic Amplitude: 9.3 mV
Lead Channel Setting Pacing Amplitude: 2 V
Lead Channel Setting Pacing Amplitude: 2.4 V
Lead Channel Setting Sensing Sensitivity: 0.6 mV
MDC IDC MSMT BATTERY REMAINING LONGEVITY: 89 mo
MDC IDC PG SERIAL: 147011
MDC IDC SET LEADCHNL RV PACING PULSEWIDTH: 0.4 ms
MDC IDC SET ZONE DETECTION INTERVAL: 240 ms
MDC IDC SET ZONE DETECTION INTERVAL: 353 ms
MDC IDC STAT BRADY RA PERCENT PACED: 0 %
MDC IDC STAT BRADY RV PERCENT PACED: 0 %
Zone Setting Detection Interval: 300 ms

## 2013-12-05 ENCOUNTER — Encounter: Payer: Self-pay | Admitting: Vascular Surgery

## 2013-12-06 ENCOUNTER — Ambulatory Visit (INDEPENDENT_AMBULATORY_CARE_PROVIDER_SITE_OTHER): Payer: PRIVATE HEALTH INSURANCE | Admitting: Vascular Surgery

## 2013-12-06 ENCOUNTER — Encounter: Payer: Self-pay | Admitting: Vascular Surgery

## 2013-12-06 ENCOUNTER — Other Ambulatory Visit (HOSPITAL_COMMUNITY): Payer: PRIVATE HEALTH INSURANCE

## 2013-12-06 VITALS — BP 133/77 | HR 87 | Resp 16 | Ht 69.5 in | Wt 207.0 lb

## 2013-12-06 DIAGNOSIS — I6509 Occlusion and stenosis of unspecified vertebral artery: Secondary | ICD-10-CM | POA: Insufficient documentation

## 2013-12-06 DIAGNOSIS — I6529 Occlusion and stenosis of unspecified carotid artery: Secondary | ICD-10-CM | POA: Insufficient documentation

## 2013-12-06 NOTE — Progress Notes (Signed)
Subjective:     Patient ID: Adam House, male   DOB: 07-Oct-1950, 63 y.o.   MRN: [id number]  HPI this 63 year old male was referred for evaluation of carotid occlusive disease. He was found 2 years ago to have a right ICA occlusion and right vertebral artery occlusion with mild disease in his left ICA. Patient relates 1 right brain TIAs approximately 3 years ago which consisted of left-sided weakness and speech difficulties. This resolved within an hour or 2. He has had no recurrent symptoms. He has no history of stroke. He did have a myocardial infarction in 2013 and received a stent at that time. He also has a implantable defibrillator. He has a 45 year history of tobacco abuse.  Past Medical History  Diagnosis Date  . CAD (coronary artery disease)     a. ant MI 1998 with PCI to LAD. b. Ant-lat MI 04/2012 tx with overlapping DES to LAD.  [name] Systolic CHF     NYHA Class II/III  . Ischemic cardiomyopathy     EF 25-30% by echo 04/2012; [place]  . CRI (chronic renal insufficiency)   . Unspecified essential hypertension   . Impotence of organic origin   . Esophageal reflux   . Tobacco use disorder   . ICD (implantable cardiac defibrillator) in place   . Hypercholesteremia   . Heart murmur   . CVA (cerebral infarction) 11/2010  . Gout   . Post-MI pericarditis     04/2012    History  Substance Use Topics  . Smoking status: Current Every Day Smoker -- 0.50 packs/day for 40 years    Types: Cigarettes  . Smokeless tobacco: Current User    Types: Snuff     Comment: may smoke one here or there depends on stress; started nicotine patch on 04-29-13, and uses snuff also  . Alcohol Use: Yes     Comment: 08/24/2012 "used to drink; not anymore; stopped > 65yrs ago; never had any problems w/it"    History reviewed. No pertinent family history.  Allergies  Allergen Reactions  . Lisinopril Anaphylaxis and Swelling    angioedema  . Naproxen Hives    Current outpatient  prescriptions:albuterol (PROVENTIL HFA;VENTOLIN HFA) 108 (90 BASE) MCG/ACT inhaler, Inhale 2 puffs into the lungs every 6 (six) hours as needed. For shortness of breath, Disp: , Rfl: ;  allopurinol (ZYLOPRIM) 100 MG tablet, Take 100 mg by mouth daily., Disp: , Rfl: ;  aspirin 81 MG tablet, Take 81 mg by mouth daily., Disp: , Rfl: ;  clopidogrel (PLAVIX) 75 MG tablet, Take 75 mg by mouth daily., Disp: , Rfl:  colchicine 0.6 MG tablet, Take 1 tablet (0.6 mg total) by mouth daily as needed. For gout, Disp: 30 tablet, Rfl: 11;  digoxin (LANOXIN) 0.125 MG tablet, TAKE (1) TABLET BY MOUTH ONCE DAILY., Disp: 30 tablet, Rfl: 0;  fish oil-omega-3 fatty acids 1000 MG capsule, Take 2 g by mouth daily., Disp: , Rfl: ;  furosemide (LASIX) 40 MG tablet, Take 1.5 tablets (60 mg total) by mouth 2 (two) times daily., Disp: 90 tablet, Rfl: 6 hydrALAZINE (APRESOLINE) 25 MG tablet, Take 3 tablets (75 mg total) by mouth 3 (three) times daily., Disp: 270 tablet, Rfl: 6;  HYDROcodone-acetaminophen (NORCO/VICODIN) 5-325 MG per tablet, Take 1-2 tablets by mouth every 6 (six) hours as needed for moderate pain., Disp: , Rfl: ;  indomethacin (INDOCIN) 25 MG capsule, Take 25 mg by mouth every 6 (six) hours., Disp: , Rfl:  isosorbide dinitrate (  DILATRATE-SR) 40 MG CR capsule, Take 1 capsule (40 mg total) by mouth 3 (three) times daily., Disp: 90 capsule, Rfl: 6;  losartan (COZAAR) 25 MG tablet, Take 1 tablet (25 mg total) by mouth daily., Disp: 30 tablet, Rfl: 6;  metoprolol succinate (TOPROL-XL) 200 MG 24 hr tablet, Take 1 tablet (200 mg total) by mouth daily., Disp: 30 tablet, Rfl: 6;  multivitamin (ONE-A-DAY MEN'S) TABS, Take 1 tablet by mouth daily., Disp: , Rfl:  nicotine (NICODERM CQ - DOSED IN MG/24 HOURS) 14 mg/24hr patch, Place 1 patch onto the skin daily., Disp: , Rfl: ;  nitroGLYCERIN (NITROSTAT) 0.4 MG SL tablet, Place 1 tablet (0.4 mg total) under the tongue every 5 (five) minutes as needed for chest pain (up to 3 doses)., Disp:  , Rfl: ;  pantoprazole (PROTONIX) 40 MG tablet, Take 1 tablet (40 mg total) by mouth as needed., Disp: 30 tablet, Rfl: 6 potassium chloride SA (KLOR-CON M20) 20 MEQ tablet, Take 1 tablet (20 mEq total) by mouth daily., Disp: 30 tablet, Rfl: 6;  predniSONE (DELTASONE) 20 MG tablet, Take 20 mg by mouth as directed., Disp: , Rfl: ;  rosuvastatin (CRESTOR) 40 MG tablet, Take 1 tablet (40 mg total) by mouth every evening., Disp: 30 tablet, Rfl: 6;  spironolactone (ALDACTONE) 25 MG tablet, Take 25 mg by mouth daily., Disp: , Rfl:   BP 133/77  Pulse 87  Resp 16  Ht 5' 9.5" (1.765 m)  Wt 207 lb (93.895 kg)  BMI 30.14 kg/m2  Body mass index is 30.14 kg/(m^2).           Review of Systems denies chest pain does have dyspnea on exertion, orthopnea, leg pain with ambulation, adductive cough, wheezing, weakness and numbness in arms and legs at times. Also complains of dysuria. Other systems negative and a complete review of system    Objective:   Physical Exam BP 133/77  Pulse 87  Resp 16  Ht 5' 9.5" (1.765 m)  Wt 207 lb (93.895 kg)  BMI 30.14 kg/m2  Gen.-alert and oriented x3 in no apparent distress HEENT normal for age Lungs no rhonchi or wheezing Cardiovascular regular rhythm no murmurs carotid pulses 3+ palpable-soft bruit on the left-implantable defibrillator palpable left upper chest wall Abdomen soft nontender no palpable masses Musculoskeletal free of  major deformities Skin clear -no rashes Neurologic normal Lower extremities 3+ femoral pulses palpable bilaterally. Both feet well perfused with no edema.  Today I reviewed the carotid duplex exam performed on 10/13/2013. This revealed right ICA occlusion and right vertebral artery occlusion with antegrade flow in the left vertebral artery and a mild left ICA stenosis in the 40-50% range.        Assessment:     #1 occlusion right ICA and vertebral artery-asymptomatic #2 mild/moderate left ICA stenosis and patent left  vertebral artery-asymptomatic #3 ongoing tobacco abuse-45 years #4 coronary artery disease history of STEMI-2013 with stent #5 implantable defibrillator in place    #2 Plan:     Patient will return to see me in 9 months with followup carotid duplex exam If he develops any lateralizing neurologic symptoms he will be in touch with me immediately. Emphasize the importance of closely following left ICA for progressive stenosis Emphasize the importance of discontinuing tobacco use because of multiple problems with atherosclerosis

## 2013-12-06 NOTE — Addendum Note (Signed)
Addended by: Mena Goes on: 12/06/2013 05:57 PM   Modules accepted: Orders

## 2013-12-13 NOTE — Progress Notes (Signed)
ICD remote 

## 2013-12-14 ENCOUNTER — Encounter: Payer: Self-pay | Admitting: *Deleted

## 2014-03-06 ENCOUNTER — Encounter: Payer: Self-pay | Admitting: Internal Medicine

## 2014-03-06 ENCOUNTER — Ambulatory Visit (INDEPENDENT_AMBULATORY_CARE_PROVIDER_SITE_OTHER): Payer: BC Managed Care – PPO | Admitting: Internal Medicine

## 2014-03-06 VITALS — BP 128/86 | HR 91 | Ht 69.0 in | Wt 199.0 lb

## 2014-03-06 DIAGNOSIS — I519 Heart disease, unspecified: Secondary | ICD-10-CM

## 2014-03-06 DIAGNOSIS — I472 Ventricular tachycardia, unspecified: Secondary | ICD-10-CM

## 2014-03-06 DIAGNOSIS — I2589 Other forms of chronic ischemic heart disease: Secondary | ICD-10-CM

## 2014-03-06 DIAGNOSIS — I4729 Other ventricular tachycardia: Secondary | ICD-10-CM

## 2014-03-06 DIAGNOSIS — I2510 Atherosclerotic heart disease of native coronary artery without angina pectoris: Secondary | ICD-10-CM

## 2014-03-06 DIAGNOSIS — I10 Essential (primary) hypertension: Secondary | ICD-10-CM

## 2014-03-06 DIAGNOSIS — I428 Other cardiomyopathies: Secondary | ICD-10-CM

## 2014-03-06 LAB — MDC_IDC_ENUM_SESS_TYPE_INCLINIC
Brady Statistic RA Percent Paced: 1 %
Lead Channel Impedance Value: 468 Ohm
Lead Channel Pacing Threshold Pulse Width: 0.4 ms
Lead Channel Pacing Threshold Pulse Width: 0.4 ms
Lead Channel Sensing Intrinsic Amplitude: 13.9 mV
Lead Channel Sensing Intrinsic Amplitude: 9.3 mV
Lead Channel Setting Pacing Amplitude: 2.4 V
Lead Channel Setting Pacing Pulse Width: 0.4 ms
MDC IDC MSMT LEADCHNL RA IMPEDANCE VALUE: 540 Ohm
MDC IDC MSMT LEADCHNL RA PACING THRESHOLD AMPLITUDE: 0.9 V
MDC IDC MSMT LEADCHNL RV PACING THRESHOLD AMPLITUDE: 1.1 V
MDC IDC PG SERIAL: 147011
MDC IDC SET LEADCHNL RA PACING AMPLITUDE: 2 V
MDC IDC SET LEADCHNL RV SENSING SENSITIVITY: 0.6 mV
MDC IDC SET ZONE DETECTION INTERVAL: 240 ms
MDC IDC STAT BRADY RV PERCENT PACED: 1 %
Zone Setting Detection Interval: 300 ms
Zone Setting Detection Interval: 353 ms

## 2014-03-06 NOTE — Patient Instructions (Signed)
   Remote device/latitude/Home check 06/08/14. Your physician recommends that you schedule a follow-up appointment in: 1 year with Dr. Rayann Heman. You will receive a reminder letter in the mail in about 10 months reminding you to call and schedule your appointment. If you don't receive this letter, please contact our office. Your physician recommends that you continue on your current medications as directed. Please refer to the Current Medication list given to you today.

## 2014-03-06 NOTE — Progress Notes (Signed)
PCP: FANTA,[name], MD Primary Cardiologist:  Dr Nicholaus Corolla is a 63 y.o. male who presents today for electrophysiology followup.  Since last being seen in our clinic, the patient reports doing very well. He has had no further arrhythmias.  He has recently been very active in his garden. Today, he denies symptoms of palpitations, chest pain, shortness of breath,  lower extremity edema, dizziness,  syncope, or ICD shocks.  The patient is otherwise without complaint today.   Past Medical History  Diagnosis Date  . CAD (coronary artery disease)     a. ant MI 1998 with PCI to LAD. b. Ant-lat MI 04/2012 tx with overlapping DES to LAD.  [name] Systolic CHF     NYHA Class II/III  . Ischemic cardiomyopathy     EF 25-30% by echo 04/2012; [place]  . CRI (chronic renal insufficiency)   . Unspecified essential hypertension   . Impotence of organic origin   . Esophageal reflux   . Tobacco use disorder   . ICD (implantable cardiac defibrillator) in place   . Hypercholesteremia   . Heart murmur   . CVA (cerebral infarction) 11/2010  . Gout   . Post-MI pericarditis     04/2012   Past Surgical History  Procedure Laterality Date  . Anterior cervical decomp/discectomy fusion  2007    "put in 4 screws to hold my head on; ruptured C5; job related injury" (08/24/2012)  . Cardiac defibrillator placement  2011    [employer]  he is a [facility] RIT study patient  . Coronary angioplasty with stent placement  05/1997; 04/2012    "1 + 2; total of 3" (08/24/2012)    Current Outpatient Prescriptions  Medication Sig Dispense Refill  . albuterol (PROVENTIL HFA;VENTOLIN HFA) 108 (90 BASE) MCG/ACT inhaler Inhale 2 puffs into the lungs every 6 (six) hours as needed. For shortness of breath      . allopurinol (ZYLOPRIM) 100 MG tablet Take 100 mg by mouth daily.      [name] aspirin 81 MG tablet Take 81 mg by mouth daily.      . clopidogrel (PLAVIX) 75 MG tablet Take 75 mg by mouth daily.       . colchicine 0.6 MG tablet Take 1 tablet (0.6 mg total) by mouth daily as needed. For gout  30 tablet  11  . digoxin (LANOXIN) 0.125 MG tablet TAKE (1) TABLET BY MOUTH ONCE DAILY.  30 tablet  0  . fish oil-omega-3 fatty acids 1000 MG capsule Take 2 g by mouth daily.      . furosemide (LASIX) 40 MG tablet Take 1.5 tablets (60 mg total) by mouth 2 (two) times daily.  90 tablet  6  . hydrALAZINE (APRESOLINE) 25 MG tablet Take 3 tablets (75 mg total) by mouth 3 (three) times daily.  270 tablet  6  . HYDROcodone-acetaminophen (NORCO/VICODIN) 5-325 MG per tablet Take 1-2 tablets by mouth every 6 (six) hours as needed for moderate pain.      . indomethacin (INDOCIN) 25 MG capsule Take 25 mg by mouth every 6 (six) hours.      . isosorbide dinitrate (DILATRATE-SR) 40 MG CR capsule Take 1 capsule (40 mg total) by mouth 3 (three) times daily.  90 capsule  6  . losartan (COZAAR) 25 MG tablet Take 1 tablet (25 mg total) by mouth daily.  30 tablet  6  . metoprolol succinate (TOPROL-XL) 200 MG 24 hr tablet Take 1 tablet (200 mg total)  by mouth daily.  30 tablet  6  . multivitamin (ONE-A-DAY MEN'S) TABS Take 1 tablet by mouth daily.      . nicotine (NICODERM CQ - DOSED IN MG/24 HOURS) 14 mg/24hr patch Place 1 patch onto the skin daily.      . pantoprazole (PROTONIX) 40 MG tablet Take 1 tablet (40 mg total) by mouth as needed.  30 tablet  6  . potassium chloride SA (KLOR-CON M20) 20 MEQ tablet Take 1 tablet (20 mEq total) by mouth daily.  30 tablet  6  . predniSONE (DELTASONE) 20 MG tablet Take 20 mg by mouth as directed.      . rosuvastatin (CRESTOR) 40 MG tablet Take 1 tablet (40 mg total) by mouth every evening.  30 tablet  6  . spironolactone (ALDACTONE) 25 MG tablet Take 25 mg by mouth daily.      . nitroGLYCERIN (NITROSTAT) 0.4 MG SL tablet Place 1 tablet (0.4 mg total) under the tongue every 5 (five) minutes as needed for chest pain (up to 3 doses).       No current facility-administered medications for  this visit.    Physical Exam: Filed Vitals:   03/06/14 1051  BP: 128/86  Pulse: 91  Height: 5\' 9"  (1.753 m)  Weight: 199 lb (90.266 kg)    GEN- The patient is well appearing, alert and oriented x 3 today.   Head- normocephalic, atraumatic Eyes-  Sclera clear, conjunctiva pink Ears- hearing intact Oropharynx- clear Lungs- Clear to ausculation bilaterally, normal work of breathing Chest- ICD pocket is well healed Heart- Regular rate and rhythm, no murmurs, rubs or gallops, PMI not laterally displaced GI- soft, NT, ND, + BS Extremities- no clubbing, cyanosis, or edema  ICD interrogation- reviewed in detail today,  See PACEART report  Assessment and Plan:  1. Ischemic CM/ CAD/ chronic systolic dysfunction Stable, no ischemic symptoms, euvolemic  Normal ICD function See Pace Art report No changes today  2. VT Improved with increased toprol No changes today  3. HTN Stable No change required today  [place]itudes Return in 12 months  He drove his [place] by the office for me to look at today.  I told him that he has a beautiful car.

## 2014-03-23 ENCOUNTER — Encounter: Payer: Self-pay | Admitting: Internal Medicine

## 2014-04-10 ENCOUNTER — Ambulatory Visit (INDEPENDENT_AMBULATORY_CARE_PROVIDER_SITE_OTHER): Payer: BC Managed Care – PPO | Admitting: Cardiovascular Disease

## 2014-04-10 ENCOUNTER — Encounter: Payer: Self-pay | Admitting: Cardiovascular Disease

## 2014-04-10 VITALS — BP 131/65 | HR 73 | Ht 69.0 in | Wt 199.0 lb

## 2014-04-10 DIAGNOSIS — I5022 Chronic systolic (congestive) heart failure: Secondary | ICD-10-CM

## 2014-04-10 DIAGNOSIS — I2510 Atherosclerotic heart disease of native coronary artery without angina pectoris: Secondary | ICD-10-CM

## 2014-04-10 DIAGNOSIS — Z720 Tobacco use: Secondary | ICD-10-CM

## 2014-04-10 DIAGNOSIS — N182 Chronic kidney disease, stage 2 (mild): Secondary | ICD-10-CM

## 2014-04-10 DIAGNOSIS — Z95810 Presence of automatic (implantable) cardiac defibrillator: Secondary | ICD-10-CM

## 2014-04-10 DIAGNOSIS — J438 Other emphysema: Secondary | ICD-10-CM

## 2014-04-10 DIAGNOSIS — J439 Emphysema, unspecified: Secondary | ICD-10-CM

## 2014-04-10 DIAGNOSIS — I779 Disorder of arteries and arterioles, unspecified: Secondary | ICD-10-CM

## 2014-04-10 DIAGNOSIS — I519 Heart disease, unspecified: Secondary | ICD-10-CM

## 2014-04-10 DIAGNOSIS — R0989 Other specified symptoms and signs involving the circulatory and respiratory systems: Secondary | ICD-10-CM

## 2014-04-10 DIAGNOSIS — Z79899 Other long term (current) drug therapy: Secondary | ICD-10-CM

## 2014-04-10 DIAGNOSIS — R0609 Other forms of dyspnea: Secondary | ICD-10-CM

## 2014-04-10 DIAGNOSIS — I472 Ventricular tachycardia, unspecified: Secondary | ICD-10-CM

## 2014-04-10 DIAGNOSIS — E785 Hyperlipidemia, unspecified: Secondary | ICD-10-CM

## 2014-04-10 DIAGNOSIS — I509 Heart failure, unspecified: Secondary | ICD-10-CM

## 2014-04-10 DIAGNOSIS — I10 Essential (primary) hypertension: Secondary | ICD-10-CM

## 2014-04-10 DIAGNOSIS — I739 Peripheral vascular disease, unspecified: Secondary | ICD-10-CM

## 2014-04-10 DIAGNOSIS — F17200 Nicotine dependence, unspecified, uncomplicated: Secondary | ICD-10-CM

## 2014-04-10 DIAGNOSIS — I4729 Other ventricular tachycardia: Secondary | ICD-10-CM

## 2014-04-10 NOTE — Patient Instructions (Signed)
   Labs for FLP, LFT  Office will contact with results via phone or letter.   Referral to Pulmonology  Your physician has recommended that you have a pulmonary function test. Pulmonary Function Tests are a group of tests that measure how well air moves in and out of your lungs. Continue all current medications. Your physician wants you to follow up in: 6 months.  You will receive a reminder letter in the mail one-two months in advance.  If you don't receive a letter, please call our office to schedule the follow up appointment

## 2014-04-10 NOTE — Progress Notes (Signed)
Patient ID: OLUWAFEMI VILLELLA, male   DOB: 1951/09/06, 63 y.o.   MRN: [id number]      SUBJECTIVE: Mr. Beveridge is a 63 yo with a history of CAD and ischemic cardiomyopathy who presents for cardiology followup. I take care of his father as well.  He had an anterolateral MI in 04/2012 treated with overlapping Promus stents in the LAD. EF 25-30% on last echo in 04/2012. He had post-MI pericarditis as well as acute on chronic systolic CHF during 09/9507 admission.   He is doing very well from a cardiovascular stand and denies chest pain, lightheadedness, dizziness, palpitations, orthopnea, and syncope.  He has been having dyspnea on exertion and has finally decided to quit smoking as of 27 days ago. He has not had PFTs.  He lifts weights at home to stay in shape.  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     Allergies  Allergen Reactions  . Lisinopril Anaphylaxis and Swelling    angioedema  . Naproxen Hives    Current Outpatient Prescriptions  Medication Sig Dispense Refill  . albuterol (PROVENTIL HFA;VENTOLIN HFA) 108 (90 BASE) MCG/ACT inhaler Inhale 2 puffs into the lungs every 6 (six) hours as needed. For shortness of breath      . allopurinol (ZYLOPRIM) 100 MG tablet Take 100 mg by mouth daily.      [name] aspirin 81 MG tablet Take 81 mg by mouth daily.      . clopidogrel (PLAVIX) 75 MG tablet Take 75 mg by mouth daily.      . colchicine 0.6 MG tablet Take 1 tablet (0.6 mg total) by mouth daily as needed. For gout  30 tablet  11  . digoxin (LANOXIN) 0.125 MG tablet TAKE (1) TABLET BY MOUTH ONCE DAILY.  30 tablet  0  . fish oil-omega-3 fatty acids 1000 MG capsule Take 2 g by mouth daily.      . furosemide (LASIX) 40 MG tablet Take 1.5 tablets (60 mg total) by mouth 2 (two) times daily.  90 tablet  6  . hydrALAZINE (APRESOLINE) 25 MG tablet Take 3 tablets (75 mg total) by mouth 3 (three) times daily.  270 tablet  6  .  HYDROcodone-acetaminophen (NORCO/VICODIN) 5-325 MG per tablet Take 1-2 tablets by mouth every 6 (six) hours as needed for moderate pain.      . indomethacin (INDOCIN) 25 MG capsule Take 25 mg by mouth every 6 (six) hours.      . isosorbide dinitrate (DILATRATE-SR) 40 MG CR capsule Take 1 capsule (40 mg total) by mouth 3 (three) times daily.  90 capsule  6  . losartan (COZAAR) 25 MG tablet Take 1 tablet (25 mg total) by mouth daily.  30 tablet  6  . metoprolol succinate (TOPROL-XL) 200 MG 24 hr tablet Take 1 tablet (200 mg total) by mouth daily.  30 tablet  6  . multivitamin (ONE-A-DAY MEN'S) TABS Take 1 tablet by mouth daily.      . nicotine (NICODERM CQ - DOSED IN MG/24 HOURS) 14 mg/24hr patch Place 1 patch onto the skin daily.      . pantoprazole (PROTONIX) 40 MG tablet Take 1 tablet (40 mg total) by mouth as needed.  30 tablet  6  . potassium chloride SA (KLOR-CON M20) 20 MEQ tablet Take 1 tablet (20 mEq total) by mouth daily.  30 tablet  6  . predniSONE (DELTASONE) 20 MG  tablet Take 20 mg by mouth as directed.      . rosuvastatin (CRESTOR) 40 MG tablet Take 1 tablet (40 mg total) by mouth every evening.  30 tablet  6  . spironolactone (ALDACTONE) 25 MG tablet Take 25 mg by mouth daily.      . nitroGLYCERIN (NITROSTAT) 0.4 MG SL tablet Place 1 tablet (0.4 mg total) under the tongue every 5 (five) minutes as needed for chest pain (up to 3 doses).       No current facility-administered medications for this visit.    Past Medical History  Diagnosis Date  . CAD (coronary artery disease)     a. ant MI 1998 with PCI to LAD. b. Ant-lat MI 04/2012 tx with overlapping DES to LAD.  [name] Systolic CHF     NYHA Class II/III  . Ischemic cardiomyopathy     EF 25-30% by echo 04/2012; [facility]  . CRI (chronic renal insufficiency)   . Unspecified essential hypertension   . Impotence of organic origin   . Esophageal reflux   . Tobacco use disorder   . ICD (implantable cardiac defibrillator) in  place   . Hypercholesteremia   . Heart murmur   . CVA (cerebral infarction) 11/2010  . Gout   . 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 Anterior cervical decomp/discectomy fusion  2007    "put in 4 screws to hold my head on; ruptured C5; job related injury" (08/24/2012)  . Cardiac defibrillator placement  2011    [employer]  he is a MAD[facility] RIT study patient  . Coronary angioplasty with stent placement  05/1997; 04/2012    "1 + 2; total of 3" (08/24/2012)    History   Social History  . Marital Status: Married    Spouse Name: N/A    Number of Children: N/A  . Years of Education: N/A   Occupational History  . RETIRED    Social History Main Topics  . Smoking status: Former Smoker -- 0.50 packs/day for 40 years    Types: Cigarettes  . Smokeless tobacco: Current User    Types: Snuff     Comment: has not smoked in 27 days  . Alcohol Use: Yes     Comment: 08/24/2012 "used to drink; not anymore; stopped > 49yrs ago; never had any problems w/it"  . Drug Use: Yes    Special: Marijuana     Comment: 08/24/2012 "last drug use >4-5 months ago"  . Sexual Activity: Yes   Other Topics Concern  . Not on file   Social History Narrative   Last updated: 03/15/2010   Retired and lives in [place].  Previously worked for [employer].    Tobacco Use - 1PPD x 40+ years,  has quit x 3 days   ETOH- rare   Drugs- occasional marijuana            Filed Vitals:   04/10/14 0817  BP: 131/65  Pulse: 73  Height: 5\' 9"  (1.753 m)  Weight: 199 lb (90.266 kg)  SpO2: 91%    PHYSICAL EXAM General: NAD Neck: No JVD, no thyromegaly. Lungs: Clear to auscultation bilaterally with normal respiratory effort. CV: Nondisplaced PMI.  Regular rate and rhythm, normal S1/S2, no S3/S4, no murmur. No pretibial or periankle edema.  No carotid bruit.  Normal pedal pulses.  Abdomen: Soft, nontender, no hepatosplenomegaly, no distention.  Neurologic:  Alert and oriented x  3.  Psych: Normal affect. Extremities: No clubbing or cyanosis.   ECG: reviewed and available in electronic records.      ASSESSMENT AND PLAN: 1. CAD: Anterolateral STEMI in 8/13 with overlapping DES to LAD. No chest pain. Continue ASA 81 mg daily, Plavix, statin (Crestor 40 mg daily), Toprol XL.  2. Chronic systolic CHF with ICD: Appears euvolemic. He is not on ACEI due to history of angioedema.  - Continue losartan, digoxin, hydralazine/isordil, and spironolactone 25 mg daily.  - Continue Lasix 60 mg bid.  - Continue Toprol XL 200 mg daily.  3. Hyperlipidemia: Continue Crestor 40 mg hs. Check lipids and LFT's. 4. DOE/history of tobacco abuse: He has now quit. He is only using albuterol and prednisone prn. I will obtain PFT's and make a pulmonology referral. 5. Carotid stenosis: Chronic RICA occlusion with minimal LICA disease (1-16% in 09/2013). Needs to stay on statin, ASA, and Plavix. He is now followed by vascular. 6. CKD: Continue to monitor.   Dispo: f/u 6 months.   Kate Sable, M.D., F.A.C.C.

## 2014-04-21 ENCOUNTER — Telehealth: Payer: Self-pay | Admitting: *Deleted

## 2014-04-21 NOTE — Telephone Encounter (Signed)
Left message to return call with patient.

## 2014-04-21 NOTE — Telephone Encounter (Signed)
Message copied by Laurine Blazer on Fri Apr 21, 2014  4:10 PM ------      Message from: Kate Sable A      Created: Wed Apr 19, 2014  9:57 AM       Please have pt call [facility] Evert to proceed with additional therapies. ------

## 2014-04-24 ENCOUNTER — Telehealth: Payer: Self-pay | Admitting: Pharmacist

## 2014-04-24 MED ORDER — EZETIMIBE 10 MG PO TABS: 10.0000 mg | ORAL_TABLET | Freq: Every day | ORAL | Status: DC

## 2014-04-24 NOTE — Telephone Encounter (Signed)
Patient called to ask me about getting him started on Praluent (PCSK-9 inhibitor) as his LDL is still ~ 110 mg/dL despite Crestor 40 mg qd.  He has a h/o CAD / MI.  He couldn't tolerate lipitor due to muscle aches, GI upset, and fatigue.  He has not tried Zetia.  He has [employer] [place] currently, who wants to see max tolerated statin + Zetia or BAS in patient, and if unable to get to goal, or if unable to tolerate (or contraindication) would be more willing to cover Praluent.  Given [employer] [place] requirements at this time, patient needs to try Zetia 10 mg qd in addition to his Crestor 40 mg first.  Patient tells me he is concerned he may get GI upset or muscle aches again like he did with Lipitor, and doesn't want to pay for something he might not tolerate.  I suggest he call the [facility] office and ask for Zetia samples to start.  If he can't tolerate Zetia, he agrees to call me back and I will get him set up for Praluent (PCSK-9 inhibitor).  Please call patient and supply him with samples of Zetia 10 mg once daily.  Send in a prescription for him to fill if he tolerates samples.  Would recheck lipid panel and hepatic panel in 3 months.

## 2014-04-24 NOTE — Telephone Encounter (Signed)
Was able to obtain samples from the [facility] office and will have patient pick them up there. Patient informed and said he would go tomorrow to get zetia samples. Patient informed that he would be contacted in 3 months for fasting lab work.

## 2014-04-24 NOTE — Telephone Encounter (Signed)
Message copied by Merlene Laughter on Mon Apr 24, 2014  1:16 PM ------      Message from: Kate Sable A      Created: Wed Apr 19, 2014  9:57 AM       Please have pt call [facility] Evert to proceed with additional therapies. ------

## 2014-04-24 NOTE — Telephone Encounter (Signed)
Patient informed and verbalized understanding of plan. 

## 2014-04-24 NOTE — Telephone Encounter (Signed)
Will attempt to supply with samples of Zetia. If he tolerates, will plan on prescribing 10 mg daily. Repeat lipids and LFT's in 3 months.

## 2014-05-01 ENCOUNTER — Encounter: Payer: Self-pay | Admitting: Internal Medicine

## 2014-05-01 ENCOUNTER — Ambulatory Visit: Payer: BC Managed Care – PPO | Admitting: Internal Medicine

## 2014-05-01 VITALS — BP 142/90 | HR 100 | Ht 69.0 in | Wt 203.0 lb

## 2014-05-01 DIAGNOSIS — R0689 Other abnormalities of breathing: Principal | ICD-10-CM

## 2014-05-01 DIAGNOSIS — R0600 Dyspnea, unspecified: Secondary | ICD-10-CM

## 2014-05-01 NOTE — Progress Notes (Signed)
Subjective:    Patient ID: Adam House, male    DOB: [birth year], 63 y.o.   MRN: [id number] PCP FANTA,[name], MD  HPI  IOV 05/01/2014  Chief Complaint  Patient presents with  . Pulmonary Consult    Referred by Dr. Jacinta Shoe for emphysema. Pt c/o DOE, prod cough with white mucous. Pt denies CP/tightness.      63 year old male, > 20 pack smoker quit 2 months ago. Known CAD and chronic systolci CHF history; per him since late 1990s and with AICD placement in 2011. Never had dyspnea per him and wife till 1 year ago. Since then new onset, insidious onset of dyspnea that is exertional and relieved by rest. Brought on by walking to mail box and back but not for changing clothes or taking a shower. Wife also notices he is not able to walk his dogs as well as before. Cards notes suggests cardiac status is stable. Dyspnea is progressive.   Imaging review: shows CXR has has suggestion of chronic ILD changes but unjclear if related to low EF or not.   Exposure hx   reports that he quit smoking about 2 months ago. His smoking use included Cigarettes. He has a 20 pack-year smoking history. His smokeless tobacco use includes Snuff.  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 Not on it currently  Past Medical History  Diagnosis Date  . CAD (coronary artery disease)     a. ant MI 1998 with PCI to LAD. b. Ant-lat MI 04/2012 tx with overlapping DES to LAD.  [name] Systolic CHF     NYHA Class II/III  . Ischemic cardiomyopathy     EF 25-30% by echo 04/2012; [facility]  . CRI (chronic renal insufficiency)   . Unspecified essential hypertension   . Impotence of organic origin   . Esophageal reflux   . Tobacco use disorder   . ICD (implantable cardiac defibrillator) in place   . Hypercholesteremia   . Heart  murmur   . CVA (cerebral infarction) 11/2010  . Gout   . 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   History   Social History  . Marital Status: Married    Spouse Name: N/A    Number of Children: N/A  . Years of Education: N/A   Occupational History  . RETIRED    Social History Main Topics  . Smoking status: Former Smoker -- 0.50 packs/day for 40 years    Types: Cigarettes    Quit date: 02/13/2014  . Smokeless tobacco: Current User    Types: Snuff  . Alcohol Use: No     Comment: 08/24/2012 "used to drink; not anymore; stopped > 104yrs ago; never had any problems w/it"  . Drug Use: Yes    Special: Marijuana     Comment: 08/24/2012 "last drug use >4-5 months ago"  . Sexual Activity: Yes   Other Topics Concern  . Not on file   Social History Narrative   Last updated: 03/15/2010   Retired and lives in [place].  Previously worked for [employer].    Tobacco Use - 1PPD x 40+ years,  has quit x 3 days   ETOH- rare   Drugs- occasional marijuana  Allergies  Allergen Reactions  . Lisinopril Anaphylaxis and Swelling    angioedema  . Naproxen Hives     Outpatient Prescriptions Prior to Visit  Medication Sig Dispense Refill  . albuterol (PROVENTIL HFA;VENTOLIN HFA) 108 (90 BASE) MCG/ACT inhaler Inhale 2 puffs into the lungs every 6 (six) hours as needed. For shortness of breath      . allopurinol (ZYLOPRIM) 100 MG tablet Take 100 mg by mouth daily.      [name] aspirin 81 MG tablet Take 81 mg by mouth daily.      . clopidogrel (PLAVIX) 75 MG tablet Take 75 mg by mouth daily.      . colchicine 0.6 MG tablet Take 1 tablet (0.6 mg total) by mouth daily as needed. For gout  30 tablet  11  . digoxin (LANOXIN) 0.125 MG tablet TAKE (1) TABLET BY MOUTH ONCE DAILY.  30 tablet  0  . ezetimibe (ZETIA) 10 MG tablet Take 1 tablet (10 mg total) by mouth daily.  28 tablet  0  . fish oil-omega-3 fatty acids 1000 MG capsule Take 2 g by mouth daily.      .  furosemide (LASIX) 40 MG tablet Take 1.5 tablets (60 mg total) by mouth 2 (two) times daily.  90 tablet  6  . hydrALAZINE (APRESOLINE) 25 MG tablet Take 3 tablets (75 mg total) by mouth 3 (three) times daily.  270 tablet  6  . indomethacin (INDOCIN) 25 MG capsule Take 25 mg by mouth every 6 (six) hours.      . isosorbide dinitrate (DILATRATE-SR) 40 MG CR capsule Take 1 capsule (40 mg total) by mouth 3 (three) times daily.  90 capsule  6  . losartan (COZAAR) 25 MG tablet Take 1 tablet (25 mg total) by mouth daily.  30 tablet  6  . metoprolol succinate (TOPROL-XL) 200 MG 24 hr tablet Take 1 tablet (200 mg total) by mouth daily.  30 tablet  6  . multivitamin (ONE-A-DAY MEN'S) TABS Take 1 tablet by mouth daily.      . pantoprazole (PROTONIX) 40 MG tablet Take 1 tablet (40 mg total) by mouth as needed.  30 tablet  6  . potassium chloride SA (KLOR-CON M20) 20 MEQ tablet Take 1 tablet (20 mEq total) by mouth daily.  30 tablet  6  . rosuvastatin (CRESTOR) 40 MG tablet Take 1 tablet (40 mg total) by mouth every evening.  30 tablet  6  . spironolactone (ALDACTONE) 25 MG tablet Take 25 mg by mouth daily.      . nitroGLYCERIN (NITROSTAT) 0.4 MG SL tablet Place 1 tablet (0.4 mg total) under the tongue every 5 (five) minutes as needed for chest pain (up to 3 doses).      . predniSONE (DELTASONE) 20 MG tablet Take 20 mg by mouth as directed.      [name] HYDROcodone-acetaminophen (NORCO/VICODIN) 5-325 MG per tablet Take 1-2 tablets by mouth every 6 (six) hours as needed for moderate pain.      . nicotine (NICODERM CQ - DOSED IN MG/24 HOURS) 14 mg/24hr patch Place 1 patch onto the skin daily.       No facility-administered medications prior to visit.      Review of Systems  Constitutional: Negative for fever and unexpected weight change.  HENT: Positive for congestion. Negative for dental problem, ear pain, nosebleeds, postnasal drip, rhinorrhea, sinus pressure, sneezing, sore throat and trouble swallowing.     Eyes: Negative for redness and itching.  Respiratory: Positive  for cough and shortness of breath. Negative for chest tightness and wheezing.   Cardiovascular: Negative for palpitations and leg swelling.  Gastrointestinal: Negative for nausea and vomiting.  Genitourinary: Negative for dysuria.  Musculoskeletal: Negative for joint swelling.  Skin: Negative for rash.  Neurological: Negative for headaches.  Hematological: Does not bruise/bleed easily.  Psychiatric/Behavioral: Negative for dysphoric mood. The patient is not nervous/anxious.        Objective:   Physical Exam  Nursing note and vitals reviewed. Constitutional: He is oriented to person, place, and time. He appears well-developed and well-nourished. No distress.  HENT:  Head: Normocephalic and atraumatic.  Right Ear: External ear normal.  Left Ear: External ear normal.  Mouth/Throat: Oropharynx is clear and moist. No oropharyngeal exudate.  Eyes: Conjunctivae and EOM are normal. Pupils are equal, round, and reactive to light. Right eye exhibits no discharge. Left eye exhibits no discharge. No scleral icterus.  Neck: Normal range of motion. Neck supple. No JVD present. No tracheal deviation present. No thyromegaly present.  Cardiovascular: Normal rate, regular rhythm and intact distal pulses.  Exam reveals no gallop and no friction rub.   No murmur heard. Pulmonary/Chest: Effort normal. No respiratory distress. He has no wheezes. He has rales. He exhibits no tenderness.  Abdominal: Soft. Bowel sounds are normal. He exhibits no distension and no mass. There is no tenderness. There is no rebound and no guarding.  Musculoskeletal: Normal range of motion. He exhibits no edema and no tenderness.  Lymphadenopathy:    He has no cervical adenopathy.  Neurological: He is alert and oriented to person, place, and time. He has normal reflexes. No cranial nerve deficit. Coordination normal.  Skin: Skin is warm and dry. No rash noted. He is  not diaphoretic. No erythema. No pallor.  Psychiatric: He has a normal mood and affect. His behavior is normal. Judgment and thought content normal.    Filed Vitals:   05/01/14 0933  BP: 142/90  Pulse: 100  Height: 5\' 9"  (1.753 m)  Weight: 203 lb (92.08 kg)  SpO2: 99%        Assessment & Plan:  #Shortness of breath  - concerned that you have emphysema or Interstitial Lung disease (scar tissue in lung)  - High Resolution CT chest without contrast on ILD protocol. Only  Dr Lorin Picket or Dr. Vinnie Langton to read - do full PT  -do ONO on Room Air   #Followup  - finish above and return for followup < 3 weeks; see me or my NP

## 2014-05-01 NOTE — Patient Instructions (Addendum)
#  Shortness of breath  - concerned that you have emphysema or Interstitial Lung disease (scar tissue in lung)  - High Resolution CT chest without contrast on ILD protocol. Only  Dr Lorin Picket or Dr. Vinnie Langton to read - do full PT  -do ONO on Room Air   #Followup  - finish above and return for followup < 3 weeks; see me or my NP

## 2014-05-03 ENCOUNTER — Other Ambulatory Visit: Payer: Self-pay | Admitting: Cardiology

## 2014-05-04 ENCOUNTER — Ambulatory Visit (INDEPENDENT_AMBULATORY_CARE_PROVIDER_SITE_OTHER)
Admission: RE | Admit: 2014-05-04 | Discharge: 2014-05-04 | Disposition: A | Discharge status: Home / Self Care | Payer: BC Managed Care – PPO | Source: Ambulatory Visit | Attending: Internal Medicine | Admitting: Internal Medicine

## 2014-05-04 DIAGNOSIS — R0989 Other specified symptoms and signs involving the circulatory and respiratory systems: Secondary | ICD-10-CM

## 2014-05-04 DIAGNOSIS — R0609 Other forms of dyspnea: Secondary | ICD-10-CM

## 2014-05-04 DIAGNOSIS — R0689 Other abnormalities of breathing: Principal | ICD-10-CM

## 2014-05-04 DIAGNOSIS — R0600 Dyspnea, unspecified: Secondary | ICD-10-CM

## 2014-05-07 ENCOUNTER — Telehealth: Payer: Self-pay | Admitting: Internal Medicine

## 2014-05-07 DIAGNOSIS — J849 Interstitial pulmonary disease, unspecified: Secondary | ICD-10-CM

## 2014-05-07 NOTE — Telephone Encounter (Signed)
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 He might need lung bx.   He needs to return sooner than 06/05/14 and needs to see me rather than Tammy  Have him  A) do his PFTs sooner; can be done at [facility] because he lives in [place]  B) Serum: ESR, ANA, DS-DNA, RF, anti-CCP, ssA, ssB, scl-70, ANCA screen, ANCA titer, Total CK,  Aldolase; can be done at [facility] because he lives in [place],   C) return to see me some 48-72h after completing above.

## 2014-05-07 NOTE — Assessment & Plan Note (Signed)
#  Shortness of breath  - concerned that you have emphysema or Interstitial Lung disease (scar tissue in lung)  - High Resolution CT chest without contrast on ILD protocol. Only  Dr Lorin Picket or Dr. Vinnie Langton to read - do full PT  -do ONO on Room Air   #Followup  - finish above and return for followup < 3 weeks; see me or my NP

## 2014-05-10 NOTE — Telephone Encounter (Signed)
Called and spoke to pt. Informed pt of recs per MR. Pt aware someone will contact him to schedule PFT at [facility]. Pt aware to also get lab work done at [place]. Informed pt that I would be contacting him again to schedule follow up. Orders have been placed.

## 2014-05-19 NOTE — Telephone Encounter (Signed)
Spoke with Suanne Marker. [facility] to schedule [facility].  She will get this scheduled and call pt Please advise thanks

## 2014-05-19 NOTE — Telephone Encounter (Signed)
Pt has not heard about his pft that should have been schd.   [phone] or [phone]

## 2014-05-19 NOTE — Telephone Encounter (Signed)
Correct order placed. Will forward to [facility].

## 2014-05-19 NOTE — Telephone Encounter (Signed)
Full PFT scheduled at [facility] for Adam House 05/31/14 at 8:00 am. Pt to arrive at 7:45 am and check in at the Information Desk at the [facility]. PFT instruction sheet mailed to patient with date and time of appointment.  Called and spoke with patient and he is aware of appointment and to expect the instruction sheet in the mail. Rhonda J Cobb Nothing else needed at this time. Rhonda J Cobb

## 2014-05-19 NOTE — Telephone Encounter (Signed)
Pt has PFT scheduled for 05/31/14 at [facility]. MR is not in the office the week of pt's PFT for pt to come in for f/u; the next week is when pt is already scheduled with TP-06/05/14.   MR please advise.

## 2014-05-23 NOTE — Telephone Encounter (Signed)
   He can do his PFT 05/31/14  When he goes to [facility] 9/16;/15 he should have the autoimmune labs I mentioned way below in string   I would like to see him instead of TP after above - how early can I see him?When is first available with me?

## 2014-05-23 NOTE — Telephone Encounter (Signed)
Your first available is 06/05/14.

## 2014-05-23 NOTE — Telephone Encounter (Signed)
lmtcb

## 2014-05-23 NOTE — Telephone Encounter (Signed)
Yeah let him see me 06/05/14 after completing all tests. Better to see me than TP due to iLd isues

## 2014-05-23 NOTE — Telephone Encounter (Signed)
C-

## 2014-05-23 NOTE — Telephone Encounter (Signed)
lmtcb to reschedule pt to MR earliest appt on 9/21.  Will forward to MR to make aware.

## 2014-05-25 NOTE — Telephone Encounter (Signed)
Called and spoke with pt. Appt changed to 9/21 with MR. Pt aware of time and date of PFT. Pt verbalized understanding and denied any further questions or concerns at this time.

## 2014-05-31 ENCOUNTER — Telehealth: Payer: Self-pay | Admitting: Internal Medicine

## 2014-05-31 ENCOUNTER — Ambulatory Visit (HOSPITAL_COMMUNITY)
Admission: RE | Admit: 2014-05-31 | Discharge: 2014-05-31 | Disposition: A | Discharge status: Home / Self Care | Payer: BC Managed Care – PPO | Source: Ambulatory Visit | Attending: Internal Medicine | Admitting: Internal Medicine

## 2014-05-31 DIAGNOSIS — F17200 Nicotine dependence, unspecified, uncomplicated: Secondary | ICD-10-CM | POA: Diagnosis not present

## 2014-05-31 DIAGNOSIS — J849 Interstitial pulmonary disease, unspecified: Secondary | ICD-10-CM

## 2014-05-31 DIAGNOSIS — R0602 Shortness of breath: Secondary | ICD-10-CM | POA: Diagnosis not present

## 2014-05-31 MED ORDER — ALBUTEROL SULFATE (2.5 MG/3ML) 0.083% IN NEBU
2.5000 mg | INHALATION_SOLUTION | Freq: Once | RESPIRATORY_TRACT | Status: AC
  Administered 2014-05-31: 2.5 mg via RESPIRATORY_TRACT

## 2014-05-31 NOTE — Telephone Encounter (Signed)
Labs placed in epic in 8.26.15 Called [facility] lab and spoke with Enid Derry to ask if orders should be placed again (were originally ordered to be done at [facility] lab) or if she would like to requisitions faxed.  Enid Derry stated requisitions would be fine (regardless that they state [facility] lab on them)  Requisitions printed and faxed to the verified number provided by Enid Derry Nothing further needed at this time; will sign off.

## 2014-05-31 NOTE — Telephone Encounter (Signed)
LMTCB

## 2014-06-01 NOTE — Telephone Encounter (Signed)
If these are for the autoimmune labs - they are not fasting. I did not order them fasting. Who gave instructiions to patient that is fasting?  Also, I am not sure why orders did not show up at [facility] prefer these be done within [facility] system so it is integrated in our records. His choices   - go back to [facility] for lab draw OR,   - come down before 06/05/14 to [facility] and do labs OR,  - just do it at [facility] on 06/05/14 and I will have to call him days later to discus results and discuss plan (not ideal but acceptable) Or,  - [facility] labs - only as last resort if he insists. I do not prefer this plan   Dr. Brand Males, M.D., [facility].P Pulmonary and Critical Care Medicine Staff Physician [facility] Pager: [phone], If no answer or between  15:00h - 7:00h: call [phone]  06/01/2014 3:31 PM

## 2014-06-01 NOTE — Telephone Encounter (Signed)
LMTCB

## 2014-06-01 NOTE — Telephone Encounter (Signed)
Spoke with patient-States he left from getting his labs as he was told that he had to fast prior to labs and [facility] lab could not see MR's orders.He left at 10:00am on 05-31-14 and labs were faxed per phone note documentation by Milda Smart on 05-31-14 at 10:08am. Pt was made aware of this but was not happy because he had went without food or water since 8:30pm on 05-30-14. Pt would like MR to let him know if these labs need to be drawn prior to Monday 06-05-14 appt with MR and if he needs to be fasting or not. If labs need to be drawn prior appt then patient would like to go to [facility] in [place] to have done.   MR please advise.

## 2014-06-01 NOTE — Telephone Encounter (Signed)
Pt aware of recs per MR  He has ov here on 9/21 and will do labs here at that time  Nothing further needed

## 2014-06-02 LAB — PULMONARY FUNCTION TEST
DL/VA % pred: 43 %
DL/VA: 1.99 ml/min/mmHg/L
DLCO UNC % PRED: 24 %
DLCO UNC: 7.79 ml/min/mmHg
DLCO cor % pred: 24 %
DLCO cor: 7.79 ml/min/mmHg
FEF 25-75 Post: 3.34 L/sec
FEF 25-75 Pre: 2.87 L/sec
FEF2575-%Change-Post: 16 %
FEF2575-%PRED-PRE: 105 %
FEF2575-%Pred-Post: 122 %
FEV1-%Change-Post: 5 %
FEV1-%Pred-Post: 81 %
FEV1-%Pred-Pre: 77 %
FEV1-Post: 2.44 L
FEV1-Pre: 2.32 L
FEV1FVC-%CHANGE-POST: 0 %
FEV1FVC-%Pred-Pre: 108 %
FEV6-%CHANGE-POST: 5 %
FEV6-%PRED-POST: 77 %
FEV6-%Pred-Pre: 73 %
FEV6-POST: 2.9 L
FEV6-PRE: 2.75 L
FEV6FVC-%Pred-Post: 104 %
FEV6FVC-%Pred-Pre: 104 %
FVC-%Change-Post: 5 %
FVC-%PRED-POST: 74 %
FVC-%Pred-Pre: 70 %
FVC-POST: 2.9 L
FVC-PRE: 2.75 L
POST FEV6/FVC RATIO: 100 %
Post FEV1/FVC ratio: 84 %
Pre FEV1/FVC ratio: 84 %
Pre FEV6/FVC Ratio: 100 %
RV % PRED: 60 %
RV: 1.4 L
TLC % pred: 63 %
TLC: 4.36 L

## 2014-06-05 ENCOUNTER — Encounter: Payer: Self-pay | Admitting: Internal Medicine

## 2014-06-05 ENCOUNTER — Encounter: Payer: BC Managed Care – PPO | Admitting: Adult Health

## 2014-06-05 ENCOUNTER — Ambulatory Visit: Payer: BC Managed Care – PPO | Admitting: Adult Health

## 2014-06-05 ENCOUNTER — Ambulatory Visit (INDEPENDENT_AMBULATORY_CARE_PROVIDER_SITE_OTHER): Payer: BC Managed Care – PPO

## 2014-06-05 ENCOUNTER — Ambulatory Visit: Payer: BC Managed Care – PPO | Admitting: Internal Medicine

## 2014-06-05 VITALS — BP 140/70 | HR 108 | Ht 69.5 in | Wt 203.0 lb

## 2014-06-05 DIAGNOSIS — J849 Interstitial pulmonary disease, unspecified: Secondary | ICD-10-CM

## 2014-06-05 DIAGNOSIS — J8410 Pulmonary fibrosis, unspecified: Secondary | ICD-10-CM

## 2014-06-05 LAB — SEDIMENTATION RATE: Sed Rate: 17 mm/hr (ref 0–22)

## 2014-06-05 NOTE — Progress Notes (Signed)
   Subjective:    Patient ID: Adam House, male    DOB: [birth year], 63 y.o.   MRN: [id number]  HPI    OV 06/05/2014   - FU ILD workup   Had HRCT 05/04/14 that DR Entrikin feels is indicative of UIP. Per ATS meets definition of POSSIBLE UIP only due to lack of honeycombing. Looks more GGO for me  There is a peripheral and basilar predominant pattern of subpleural  reticulation, traction bronchiectasis/bronchiolectasis, ground-glass  and architectural distortion. No definite honeycombing   - PFT show FVC 2.7L/79%, DLCO 7.8/24%    - ONO - for some reason not done  - Autoimmune panel - not done due to some communication error with [facility]    - HE has no new problems  Review of Systems   +ve dyspnea and cogjh    Objective:   Physical Exam   Filed Vitals:   06/05/14 0901  BP: 140/70  Pulse: 108  Height: 5' 9.5" (1.765 m)  Weight: 203 lb (92.08 kg)  SpO2: 95%   Crackles + Discsuion only visit     Assessment & Plan:   - I am concerned you  have Interstitial Lung Disease (ILD)  -  There are > 100 varieties of this. These varieties are specific diseases like IPF, Autoimmune ILD and Hypersensitivity Pneumonitis to name a few. OF thise, IPF tends to occur in people over 60 years and is progressive.  - To narrow down possibilities and assess severity please do the following tests  - do overnight oxygen test on room air  - do autoimmune panel: Serum: ESR, ANA, DS-DNA, RF, anti-CCP, ssA, ssB, scl-70, ANCA, MPO and PR-3 antibodies, Total CK,  Aldolase,  Hypersensitivity Pneumonitis Panel - Based on results, you might need surgical lung biops  I will call you with result to advise next step that likely will involve followup   (> 50% of this 15 min visit spent in face to face counseling)

## 2014-06-05 NOTE — Patient Instructions (Addendum)
-  I am concerned you  have Interstitial Lung Disease (ILD)  -  There are > 100 varieties of this. These varieties are specific diseases like IPF, Autoimmune ILD and Hypersensitivity Pneumonitis to name a few. OF thise, IPF tends to occur in people over 60 years and is progressive.  - To narrow down possibilities and assess severity please do the following tests  - do overnight oxygen test on room air  - do autoimmune panel: Serum: ESR, ANA, DS-DNA, RF, anti-CCP, ssA, ssB, scl-70, ANCA, MPO and PR-3 antibodies, Total CK,  Aldolase,  Hypersensitivity Pneumonitis Panel - Based on results, you might need surgical lung biopsy   I will call you with result to advise next step that likely will involve followup

## 2014-06-06 ENCOUNTER — Telehealth: Payer: Self-pay | Admitting: Emergency Medicine

## 2014-06-06 LAB — CK TOTAL AND CKMB (NOT AT ARMC)
CK TOTAL: 288 U/L — AB (ref 7–232)
CK, MB: 7.2 ng/mL — ABNORMAL HIGH (ref 0.0–5.0)
RELATIVE INDEX: 2.5 (ref 0.0–4.0)

## 2014-06-06 LAB — CYCLIC CITRUL PEPTIDE ANTIBODY, IGG

## 2014-06-06 LAB — ANCA SCREEN W REFLEX TITER
ATYPICAL P-ANCA SCREEN: NEGATIVE
c-ANCA Screen: POSITIVE — AB
p-ANCA Screen: NEGATIVE

## 2014-06-06 LAB — ANCA TITERS

## 2014-06-06 LAB — MPO/PR-3 (ANCA) ANTIBODIES: Serine Protease 3: <1

## 2014-06-06 LAB — ANA: Anti Nuclear Antibody(ANA): NEGATIVE

## 2014-06-06 LAB — ANTI-DNA ANTIBODY, DOUBLE-STRANDED: DS DNA AB: 1 [IU]/mL

## 2014-06-06 LAB — ANTI-SCLERODERMA ANTIBODY: Scleroderma (Scl-70) (ENA) Antibody, IgG: <1

## 2014-06-06 NOTE — Telephone Encounter (Signed)
HI Jamesetta So  I am seeing him for ILD. During eval forILD CK came back high. Noticed he is on high dose crestor. Is this the cause? Is it possible to give drug holiday  Thanks  Dr. Brand Males, M.D., [facility].C.P Pulmonary and Critical Care Medicine Staff Physician [place] [facility] Pager: [phone], If no answer or between  15:00h - 7:00h: call [phone]  06/06/2014 11:15 AM

## 2014-06-06 NOTE — Telephone Encounter (Signed)
I think that would be reasonable, Murali.

## 2014-06-06 NOTE — Telephone Encounter (Signed)
Received call from [place] from lab on 06/06/14. Pt has critical CKMB of 7.2 ng/ml. Pt is taking crestor 40mg  qd. Pt's primary cardiologist is Dr. Bronson Ing.

## 2014-06-07 LAB — ALDOLASE

## 2014-06-07 NOTE — Telephone Encounter (Signed)
Ok thanks [place]. Would you mind stopping it and monitoring ? Appreciate it  Thanks  Dr. Brand Males, M.D., [facility].C.P Pulmonary and Critical Care Medicine Staff Physician [place] [facility] Pager: [phone], If no answer or between  15:00h - 7:00h: call [phone]  06/07/2014 10:49 AM

## 2014-06-08 ENCOUNTER — Encounter: Payer: Self-pay | Admitting: Internal Medicine

## 2014-06-08 ENCOUNTER — Ambulatory Visit (INDEPENDENT_AMBULATORY_CARE_PROVIDER_SITE_OTHER): Payer: BC Managed Care – PPO | Admitting: *Deleted

## 2014-06-08 DIAGNOSIS — I428 Other cardiomyopathies: Secondary | ICD-10-CM

## 2014-06-08 NOTE — Progress Notes (Signed)
Remote ICD transmission.   

## 2014-06-12 LAB — MDC_IDC_ENUM_SESS_TYPE_REMOTE
HighPow Impedance: 56 Ohm
Implantable Pulse Generator Serial Number: 147011
Lead Channel Impedance Value: 484 Ohm
Lead Channel Impedance Value: 538 Ohm
Lead Channel Sensing Intrinsic Amplitude: 12.4 mV
Lead Channel Sensing Intrinsic Amplitude: 15.5 mV
Lead Channel Setting Pacing Amplitude: 2 V
Lead Channel Setting Pacing Amplitude: 2.4 V
Lead Channel Setting Pacing Pulse Width: 0.4 ms
Lead Channel Setting Sensing Sensitivity: 0.6 mV
Zone Setting Detection Interval: 240 ms
Zone Setting Detection Interval: 300 ms
Zone Setting Detection Interval: 353 ms

## 2014-06-13 ENCOUNTER — Telehealth: Payer: Self-pay | Admitting: Internal Medicine

## 2014-06-13 ENCOUNTER — Telehealth: Payer: Self-pay | Admitting: *Deleted

## 2014-06-13 DIAGNOSIS — J849 Interstitial pulmonary disease, unspecified: Secondary | ICD-10-CM

## 2014-06-13 NOTE — Telephone Encounter (Signed)
Message copied by Laurine Blazer on Tue Jun 13, 2014 11:25 AM ------      Message from: Barbarann Ehlers A      Created: Wed Jun 07, 2014 12:15 PM       Forward to [place]       ----- Message -----         From: Herminio Commons, MD         Sent: 06/07/2014  11:11 AM           To: Bernita Raisin, RN            Please d/c statin therapy and repeat CK, LFT's, and lipid panel in 3 months.       ------

## 2014-06-13 NOTE — Telephone Encounter (Signed)
  I called and went to voice mail of Adam House 03-02-1951  In both cell and home phone  REgarding ILD - autoimmune test - non contributory - so cause not known. I recommend surgical lung biopsy by thoracic surgery but let him know that surgeon might get cardiac clearance first. Anwyays, he has to see surgeon first  either Dr Roxan Hockey or Dr Servando Snare .    - ono 06/06/14 showed mild desat < 88% for 49minutes, lowes pulse ox 79% - will benefit from 1L [place] at night   Also, one blood test CK was high  - let him know to stop his statin - I have told this to his cardiologist Dr Derenda Fennel return message to me so I track him  Thanks  Dr. Brand Males, M.D., [facility].C.P Pulmonary and Critical Care Medicine Staff Physician [place] [facility] Pager: [phone], If no answer or between  15:00h - 7:00h: call [phone]  06/13/2014 6:18 PM

## 2014-06-13 NOTE — Telephone Encounter (Signed)
Patient notified.  Will send reminder in mail when time to repeat labs.

## 2014-06-13 NOTE — Telephone Encounter (Signed)
Left message to return call 

## 2014-06-14 NOTE — Telephone Encounter (Signed)
Called and spoke to pt. Informed pt of results and recs per MR. Pt aware someone will contact him for an appt with Dr. Roxan Hockey or Dr. Servando Snare and about setting up nocturnal O2. Pt verbalized understanding. Orders placed.   Will forward to MR per request.

## 2014-06-15 NOTE — Telephone Encounter (Signed)
ONO results obtained from [facility] and referral faxed for O2 at 1 lpm continuous QHS to [facility] per patient request. Catha Gosselin

## 2014-06-15 NOTE — Telephone Encounter (Signed)
Called and spoke with patient and advised patient that order has been faxed to [facility] for him to use 1 lpm of o2 at bedtime. Pt is aware and is aware that [facility] will contact him to arrange delivery of oxygen. Nothing else needed at this time. Rhonda J Cobb

## 2014-06-15 NOTE — Telephone Encounter (Signed)
Suanne Marker - You sent a staff message to [facility] regarding this set up.  Do I need to place a new order in order to change him to [place]?

## 2014-06-15 NOTE — Telephone Encounter (Signed)
Pt is calling stating that the dme thar was chosen for him is to [employer] and he has chosen [facility] in [place] b/c they are cheaper, and needs a prescript faxed to them for his O2 pt can be reached [phone] cell [phone] hm.Stanley A Dalton

## 2014-06-15 NOTE — Telephone Encounter (Signed)
Spoke with Melissa at [facility] and she is going to fax over ONO results. Adam House

## 2014-06-15 NOTE — Telephone Encounter (Signed)
Melissa w/ [facility] called to let MR know pt refused set up.  Melissa can be reached at [phone].  Satira Anis

## 2014-06-19 ENCOUNTER — Encounter: Payer: Self-pay | Admitting: Cardiology

## 2014-06-20 ENCOUNTER — Institutional Professional Consult (permissible substitution) (INDEPENDENT_AMBULATORY_CARE_PROVIDER_SITE_OTHER): Payer: BC Managed Care – PPO | Admitting: Thoracic Surgery (Cardiothoracic Vascular Surgery)

## 2014-06-20 ENCOUNTER — Encounter: Payer: Self-pay | Admitting: Thoracic Surgery (Cardiothoracic Vascular Surgery)

## 2014-06-20 VITALS — BP 131/87 | HR 101 | Ht 69.0 in | Wt 203.0 lb

## 2014-06-20 DIAGNOSIS — J849 Interstitial pulmonary disease, unspecified: Secondary | ICD-10-CM

## 2014-06-20 NOTE — Progress Notes (Signed)
PCP is Rosita Fire, MD Referring Provider is Brand Males, MD  Chief Complaint  Patient presents with  . NEW THORACIC    iNTERSTITIAL LUNG DISEASE    HPI: 63 year old former smoker referred for consultation regarding possible lung biopsy.  Adam House is a 63 year old gentleman with a history of coronary disease and a mini stroke. He has a 40 year history of smoking 1/2-2/3 of a pack of cigarettes a day. He quit smoking in June of this year.  He developed a nagging dry cough about 3 years ago. He wasn't having any other symptoms until about a year ago, when he began to notice exertional shortness of breath. That has progressed over time. He currently can walk about one fourth of a mile on level ground or 1 flight of stairs before having to stop to catch his breath. He worked in the [employer] before retiring in the early 2000.  He saw Dr. Chase Caller. A walking desaturation test showed a desaturation to 85% after 2-1/2 laps (185 feet). His CT showed interstitial lung disease suspicious for UIP.    Past Medical History  Diagnosis Date  . CAD (coronary artery disease)     a. ant MI 1998 with PCI to LAD. b. Ant-lat MI 04/2012 tx with overlapping DES to LAD.  [name] Systolic CHF     NYHA Class II/III  . Ischemic cardiomyopathy     EF 25-30% by echo 04/2012; [place]  . CRI (chronic renal insufficiency)   . Unspecified essential hypertension   . Impotence of organic origin   . Esophageal reflux   . Tobacco use disorder   . ICD (implantable cardiac defibrillator) in place   . Hypercholesteremia   . Heart murmur   . CVA (cerebral infarction) 11/2010  . Gout   . 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 Lung disease, interstitial     Past Surgical History  Procedure Laterality Date  . Anterior cervical decomp/discectomy fusion  2007    "put in 4 screws to hold my head on; ruptured C5; job related injury" (08/24/2012)  . Cardiac defibrillator placement  2011    [place]  [facility]  he is a MADIT RIT study patient  . Coronary angioplasty with stent placement  05/1997; 04/2012    "1 + 2; total of 3" (08/24/2012)    Family History  Problem Relation Age of Onset  . Diabetes Mother   . Heart disease Father     Social History History  Substance Use Topics  . Smoking status: Former Smoker -- 0.50 packs/day for 40 years    Types: Cigarettes    Quit date: 02/13/2014  . Smokeless tobacco: Current User    Types: Snuff  . Alcohol Use: No     Comment: 08/24/2012 "used to drink; not anymore; stopped > 21yrs ago; never had any problems w/it"    Current Outpatient Prescriptions  Medication Sig Dispense Refill  . albuterol (PROVENTIL HFA;VENTOLIN HFA) 108 (90 BASE) MCG/ACT inhaler Inhale 2 puffs into the lungs every 6 (six) hours as needed. For shortness of breath      . allopurinol (ZYLOPRIM) 100 MG tablet Take 100 mg by mouth daily.      [name] aspirin 81 MG tablet Take 81 mg by mouth daily.      . clopidogrel (PLAVIX) 75 MG tablet Take 75 mg by mouth daily.      . colchicine 0.6 MG tablet Take 1 tablet (0.6 mg total) by mouth daily as needed. For  gout  30 tablet  11  . digoxin (LANOXIN) 0.125 MG tablet TAKE (1) TABLET BY MOUTH ONCE DAILY.  30 tablet  0  . ezetimibe (ZETIA) 10 MG tablet Take 1 tablet (10 mg total) by mouth daily.  28 tablet  0  . fish oil-omega-3 fatty acids 1000 MG capsule Take 2 g by mouth daily.      . furosemide (LASIX) 40 MG tablet Take 1.5 tablets (60 mg total) by mouth 2 (two) times daily.  90 tablet  6  . hydrALAZINE (APRESOLINE) 25 MG tablet Take 3 tablets (75 mg total) by mouth 3 (three) times daily.  270 tablet  6  . indomethacin (INDOCIN) 25 MG capsule Take 25 mg by mouth every 6 (six) hours.      . isosorbide dinitrate (DILATRATE-SR) 40 MG CR capsule Take 1 capsule (40 mg total) by mouth 3 (three) times daily.  90 capsule  6  . losartan (COZAAR) 25 MG tablet Take 1 tablet (25 mg total) by mouth daily.  30 tablet  6  . metoprolol  succinate (TOPROL-XL) 200 MG 24 hr tablet Take 1 tablet (200 mg total) by mouth daily.  30 tablet  6  . multivitamin (ONE-A-DAY MEN'S) TABS Take 1 tablet by mouth daily.      . pantoprazole (PROTONIX) 40 MG tablet Take 1 tablet (40 mg total) by mouth as needed.  30 tablet  6  . potassium chloride SA (KLOR-CON M20) 20 MEQ tablet Take 1 tablet (20 mEq total) by mouth daily.  30 tablet  6  . spironolactone (ALDACTONE) 25 MG tablet Take 25 mg by mouth daily.      . nitroGLYCERIN (NITROSTAT) 0.4 MG SL tablet Place 1 tablet (0.4 mg total) under the tongue every 5 (five) minutes as needed for chest pain (up to 3 doses).       No current facility-administered medications for this visit.    Allergies  Allergen Reactions  . Lisinopril Anaphylaxis and Swelling    angioedema  . Naproxen Hives    Review of Systems  Constitutional: Negative for fever, appetite change and unexpected weight change.  Respiratory: Positive for cough (Nonproductive) and shortness of breath.   Musculoskeletal: Positive for gait problem.  Neurological:       History mini stroke 2 years ago, no residual deficits  Hematological: Bruises/bleeds easily.  All other systems reviewed and are negative.   BP 131/87  Pulse 101  Ht 5\' 9"  (1.753 m)  Wt 203 lb (92.08 kg)  BMI 29.96 kg/m2  SpO2 97% Physical Exam  Vitals reviewed. Constitutional: He is oriented to person, place, and time. He appears well-developed and well-nourished. No distress.  HENT:  Head: Normocephalic and atraumatic.  Eyes: EOM are normal. Pupils are equal, round, and reactive to light.  Neck: No thyromegaly present.  Cardiovascular: Normal rate, regular rhythm, normal heart sounds and intact distal pulses.   No murmur heard. Pulmonary/Chest: Effort normal. He has rales (Bilaterally).  Abdominal: Soft. There is no tenderness.  Musculoskeletal: He exhibits no edema.  Lymphadenopathy:    He has no cervical adenopathy.  Neurological: He is alert and  oriented to person, place, and time. No cranial nerve deficit.  Skin: Skin is warm and dry.     Diagnostic Tests: CT CHEST WITHOUT CONTRAST  TECHNIQUE:  Multidetector CT imaging of the chest was performed following the  standard protocol without intravenous contrast. High resolution  imaging of the lungs, as well as inspiratory and expiratory imaging,  was  performed.  COMPARISON: CT thoracic spine 06/30/2006 chest radiographs dating  back to 10/24/2006. MR abdomen 11/05/2009.  FINDINGS:  Mediastinal lymph nodes measure up to 11 mm in the lower right  paratracheal station. Hilar regions are difficult to definitively  evaluate without IV contrast. Subcarinal lymph node measures 1.9 cm.  No axillary adenopathy. Heart is mildly enlarged. Three-vessel  coronary artery calcification. No pericardial effusion. Lymph nodes  adjacent to the distal esophagus measure up to 11 mm.  There is a peripheral and basilar predominant pattern of subpleural  reticulation, traction bronchiectasis/bronchiolectasis, ground-glass  and architectural distortion. No definite honeycombing. Findings  appear progressive from prior exams. There are focal areas of  ground-glass in the left upper lobe (example series 5, image 24).  Fissural thickening bilaterally. No pleural fluid. No air trapping.  Airway is unremarkable.  Incidental imaging of the upper abdomen shows the visualized  portions of the liver and adrenal glands to be unremarkable. A 2.7  cm low-attenuation lesion off the upper pole right kidney is  incompletely imaged but characterized as a cyst on 11/05/2009.  Visualized portions of the the spleen, pancreas, stomach and bowel  are otherwise unremarkable. No upper abdominal adenopathy. No  worrisome lytic or sclerotic lesions. Degenerative changes are seen  in the spine.  IMPRESSION:  1. Pulmonary parenchymal pattern of fibrosis, as described above,  appears progressive from prior examinations,  indicative of usual  interstitial pneumonitis (UIP).  2. Focal areas of ground-glass in the left upper lobe, possibly  infectious or inflammatory in etiology.  3. Mediastinal and periesophageal adenopathy are often seen in the  setting of interstitial lung disease.  4. Three-vessel coronary artery calcification.  Electronically Signed  By: Lorin Picket M.D.  On: 05/05/2014 08:21    Impression: 63 year old man with interstitial lung disease. He needs a thoracoscopic lung biopsy for definitive diagnosis.  I discussed the proposed procedure with Adam House. I recommended a left VATS, lung biopsy. I described the operation to him in detail. He understands the general nature of the procedure, need for general anesthesia, expected hospital stay, and overall recovery. We discussed the indications, risks, benefits, and alternatives. He understands the risks include, but are not limited to death, MI, DVT, PE, bleeding, possible need for transfusion, infection, prolonged air leaks, cardiac arrhythmias, as well as the possibility of unforeseeable complications.  He does have a significant cardiac history. He did see Dr. Bronson Ing about 2 months ago. He has an ICD in place. He also is on Plavix which would need to be discontinued 5 days prior to surgery. We would need Dr. Court Joy input for management of the ICD and Plavix perioperatively.  Adam House is reluctant to have surgery. He wants to talk with Dr. Chase Caller again before making a decision.  If he decides to proceed with surgery, we will schedule it as quickly as possible Plan:

## 2014-06-20 NOTE — Telephone Encounter (Signed)
Adam House  Patient saw Dr Roxan Hockey 06/20/2014 and is reluctant to have surgical lung bx. Please give him an OV to see me - < 3 weeks  Thanks  Dr. Brand Males, M.D., [facility].C.P Pulmonary and Critical Care Medicine Staff Physician [place] [facility] Pager: [phone], If no answer or between  15:00h - 7:00h: call [phone]  06/20/2014 7:24 PM

## 2014-06-21 ENCOUNTER — Telehealth: Payer: Self-pay | Admitting: Cardiovascular Disease

## 2014-06-21 NOTE — Telephone Encounter (Signed)
lmtcb for pt.  

## 2014-06-21 NOTE — Telephone Encounter (Signed)
I think he can proceed with VATS as recommended. Plavix can be stopped altogether. He should remain on ASA 81 mg. Would speak with Dr. Rayann Heman regarding management of ICD in perioperative period. He has had some ventricular arrhythmias by most recent interrogation in late September, but not requiring defibrillator therapy.

## 2014-06-21 NOTE — Telephone Encounter (Signed)
Pt returned call to nurse [phone]

## 2014-06-21 NOTE — Telephone Encounter (Signed)
OK to proceed from an EP standpoint. I will forward to Erasmo Downer so that she can direct device management perioperatively per EP device clinic protocol.

## 2014-06-21 NOTE — Telephone Encounter (Signed)
Patient is very concerned about recommendations Dr Roxan Hockey. [phone])  [facility] Wants Adam House to have lung biopsy ASAP and that he needed clearance to be able to have this surgery. He is very upset and worried about being told that he may not make it thru this surgery due to his heart condintion

## 2014-06-22 NOTE — Telephone Encounter (Signed)
Called and spoke to pt. Appt made for 10/15 with MR. Pt verbalized understanding and denied any further questions or concerns at this time.

## 2014-06-22 NOTE — Telephone Encounter (Signed)
Nothing further needed. Already spoke with pt. See phone note on 06/13/14.

## 2014-06-23 ENCOUNTER — Other Ambulatory Visit: Payer: Self-pay

## 2014-06-23 DIAGNOSIS — J849 Interstitial pulmonary disease, unspecified: Secondary | ICD-10-CM

## 2014-06-23 NOTE — Telephone Encounter (Signed)
Patient informed and verbalized understanding of plan. Per Cyril Mourning, the rep will be contacted once the procedure is scheduled. Patient is aware.

## 2014-06-24 ENCOUNTER — Emergency Department (HOSPITAL_COMMUNITY): Payer: BC Managed Care – PPO | Admitting: Anesthesiology

## 2014-06-24 ENCOUNTER — Inpatient Hospital Stay (HOSPITAL_COMMUNITY)
Admission: EM | Admit: 2014-06-24 | Discharge: 2014-07-01 | DRG: 271 | Disposition: A | Discharge status: Home Health | Payer: BC Managed Care – PPO | Attending: Vascular Surgery | Admitting: Vascular Surgery

## 2014-06-24 ENCOUNTER — Encounter (HOSPITAL_COMMUNITY): Payer: BC Managed Care – PPO | Admitting: Anesthesiology

## 2014-06-24 ENCOUNTER — Encounter (HOSPITAL_COMMUNITY): Payer: Self-pay | Admitting: Emergency Medicine

## 2014-06-24 ENCOUNTER — Emergency Department (HOSPITAL_COMMUNITY): Payer: BC Managed Care – PPO

## 2014-06-24 ENCOUNTER — Encounter (HOSPITAL_COMMUNITY)
Admission: EM | Disposition: A | Discharge status: Home Health | Payer: Self-pay | Source: Home / Self Care | Attending: Vascular Surgery

## 2014-06-24 DIAGNOSIS — F1290 Cannabis use, unspecified, uncomplicated: Secondary | ICD-10-CM | POA: Diagnosis not present

## 2014-06-24 DIAGNOSIS — Z955 Presence of coronary angioplasty implant and graft: Secondary | ICD-10-CM

## 2014-06-24 DIAGNOSIS — Z6828 Body mass index (BMI) 28.0-28.9, adult: Secondary | ICD-10-CM | POA: Diagnosis not present

## 2014-06-24 DIAGNOSIS — I472 Ventricular tachycardia, unspecified: Secondary | ICD-10-CM

## 2014-06-24 DIAGNOSIS — I255 Ischemic cardiomyopathy: Secondary | ICD-10-CM | POA: Diagnosis not present

## 2014-06-24 DIAGNOSIS — I739 Peripheral vascular disease, unspecified: Secondary | ICD-10-CM | POA: Diagnosis present

## 2014-06-24 DIAGNOSIS — I252 Old myocardial infarction: Secondary | ICD-10-CM | POA: Diagnosis not present

## 2014-06-24 DIAGNOSIS — E780 Pure hypercholesterolemia: Secondary | ICD-10-CM | POA: Diagnosis present

## 2014-06-24 DIAGNOSIS — Z7902 Long term (current) use of antithrombotics/antiplatelets: Secondary | ICD-10-CM | POA: Diagnosis not present

## 2014-06-24 DIAGNOSIS — Z95810 Presence of automatic (implantable) cardiac defibrillator: Secondary | ICD-10-CM | POA: Diagnosis not present

## 2014-06-24 DIAGNOSIS — I5023 Acute on chronic systolic (congestive) heart failure: Secondary | ICD-10-CM

## 2014-06-24 DIAGNOSIS — Z79899 Other long term (current) drug therapy: Secondary | ICD-10-CM | POA: Diagnosis not present

## 2014-06-24 DIAGNOSIS — Z8673 Personal history of transient ischemic attack (TIA), and cerebral infarction without residual deficits: Secondary | ICD-10-CM | POA: Diagnosis not present

## 2014-06-24 DIAGNOSIS — Z8249 Family history of ischemic heart disease and other diseases of the circulatory system: Secondary | ICD-10-CM | POA: Diagnosis not present

## 2014-06-24 DIAGNOSIS — I6523 Occlusion and stenosis of bilateral carotid arteries: Secondary | ICD-10-CM | POA: Diagnosis not present

## 2014-06-24 DIAGNOSIS — I745 Embolism and thrombosis of iliac artery: Principal | ICD-10-CM | POA: Diagnosis present

## 2014-06-24 DIAGNOSIS — I519 Heart disease, unspecified: Secondary | ICD-10-CM

## 2014-06-24 DIAGNOSIS — I2510 Atherosclerotic heart disease of native coronary artery without angina pectoris: Secondary | ICD-10-CM | POA: Diagnosis not present

## 2014-06-24 DIAGNOSIS — Z7901 Long term (current) use of anticoagulants: Secondary | ICD-10-CM

## 2014-06-24 DIAGNOSIS — I70229 Atherosclerosis of native arteries of extremities with rest pain, unspecified extremity: Secondary | ICD-10-CM

## 2014-06-24 DIAGNOSIS — N183 Chronic kidney disease, stage 3 (moderate): Secondary | ICD-10-CM | POA: Diagnosis not present

## 2014-06-24 DIAGNOSIS — I4729 Other ventricular tachycardia: Secondary | ICD-10-CM

## 2014-06-24 DIAGNOSIS — J849 Interstitial pulmonary disease, unspecified: Secondary | ICD-10-CM

## 2014-06-24 DIAGNOSIS — R531 Weakness: Secondary | ICD-10-CM

## 2014-06-24 DIAGNOSIS — Z833 Family history of diabetes mellitus: Secondary | ICD-10-CM

## 2014-06-24 DIAGNOSIS — I10 Essential (primary) hypertension: Secondary | ICD-10-CM | POA: Diagnosis present

## 2014-06-24 DIAGNOSIS — I129 Hypertensive chronic kidney disease with stage 1 through stage 4 chronic kidney disease, or unspecified chronic kidney disease: Secondary | ICD-10-CM | POA: Diagnosis present

## 2014-06-24 DIAGNOSIS — I959 Hypotension, unspecified: Secondary | ICD-10-CM | POA: Diagnosis present

## 2014-06-24 DIAGNOSIS — M79671 Pain in right foot: Secondary | ICD-10-CM | POA: Diagnosis present

## 2014-06-24 DIAGNOSIS — I5022 Chronic systolic (congestive) heart failure: Secondary | ICD-10-CM | POA: Diagnosis not present

## 2014-06-24 DIAGNOSIS — N182 Chronic kidney disease, stage 2 (mild): Secondary | ICD-10-CM

## 2014-06-24 DIAGNOSIS — E785 Hyperlipidemia, unspecified: Secondary | ICD-10-CM | POA: Diagnosis not present

## 2014-06-24 DIAGNOSIS — I999 Unspecified disorder of circulatory system: Secondary | ICD-10-CM

## 2014-06-24 DIAGNOSIS — I743 Embolism and thrombosis of arteries of the lower extremities: Secondary | ICD-10-CM

## 2014-06-24 DIAGNOSIS — I998 Other disorder of circulatory system: Secondary | ICD-10-CM

## 2014-06-24 DIAGNOSIS — M109 Gout, unspecified: Secondary | ICD-10-CM | POA: Diagnosis not present

## 2014-06-24 DIAGNOSIS — K219 Gastro-esophageal reflux disease without esophagitis: Secondary | ICD-10-CM | POA: Diagnosis present

## 2014-06-24 DIAGNOSIS — Z87891 Personal history of nicotine dependence: Secondary | ICD-10-CM

## 2014-06-24 HISTORY — PX: EMBOLECTOMY: SHX44

## 2014-06-24 HISTORY — PX: FASCIOTOMY: SHX132

## 2014-06-24 LAB — CBC WITH DIFFERENTIAL/PLATELET
BASOS ABS: 0 10*3/uL (ref 0.0–0.1)
Basophils Relative: 0 % (ref 0–1)
Eosinophils Absolute: 0.2 10*3/uL (ref 0.0–0.7)
Eosinophils Relative: 3 % (ref 0–5)
HEMATOCRIT: 40.7 % (ref 39.0–52.0)
HEMOGLOBIN: 14.1 g/dL (ref 13.0–17.0)
Lymphocytes Relative: 17 % (ref 12–46)
Lymphs Abs: 0.9 10*3/uL (ref 0.7–4.0)
MCH: 28.8 pg (ref 26.0–34.0)
MCHC: 34.6 g/dL (ref 30.0–36.0)
MCV: 83.2 fL (ref 78.0–100.0)
Monocytes Absolute: 1.1 10*3/uL — ABNORMAL HIGH (ref 0.1–1.0)
Monocytes Relative: 19 % — ABNORMAL HIGH (ref 3–12)
NEUTROS ABS: 3.3 10*3/uL (ref 1.7–7.7)
Neutrophils Relative %: 61 % (ref 43–77)
Platelets: 249 10*3/uL (ref 150–400)
RBC: 4.89 MIL/uL (ref 4.22–5.81)
RDW: 17.1 % — AB (ref 11.5–15.5)
WBC: 5.5 10*3/uL (ref 4.0–10.5)

## 2014-06-24 LAB — I-STAT CHEM 8, ED
BUN: 12 mg/dL (ref 6–23)
CHLORIDE: 103 meq/L (ref 96–112)
Calcium, Ion: 1.14 mmol/L (ref 1.13–1.30)
Creatinine, Ser: 1.6 mg/dL — ABNORMAL HIGH (ref 0.50–1.35)
Glucose, Bld: 117 mg/dL — ABNORMAL HIGH (ref 70–99)
HCT: 44 % (ref 39.0–52.0)
Hemoglobin: 15 g/dL (ref 13.0–17.0)
POTASSIUM: 3.2 meq/L — AB (ref 3.7–5.3)
Sodium: 141 mEq/L (ref 137–147)
TCO2: 21 mmol/L (ref 0–100)

## 2014-06-24 LAB — MRSA PCR SCREENING: MRSA by PCR: NEGATIVE

## 2014-06-24 LAB — PREPARE RBC (CROSSMATCH)

## 2014-06-24 LAB — DIGOXIN LEVEL: Digoxin Level: <0.3 ng/mL — ABNORMAL LOW (ref 0.8–2.0)

## 2014-06-24 LAB — ABO/RH: ABO/RH(D): A POS

## 2014-06-24 SURGERY — EMBOLECTOMY
Anesthesia: General | Site: Leg Lower | Laterality: Right

## 2014-06-24 MED ORDER — GLYCOPYRROLATE 0.2 MG/ML IJ SOLN
INTRAMUSCULAR | Status: AC
  Filled 2014-06-24: qty 2

## 2014-06-24 MED ORDER — ONDANSETRON HCL 4 MG/2ML IJ SOLN
4.0000 mg | Freq: Four times a day (QID) | INTRAMUSCULAR | Status: DC | PRN
  Administered 2014-06-25: 4 mg via INTRAVENOUS
  Filled 2014-06-24: qty 2

## 2014-06-24 MED ORDER — 0.9 % SODIUM CHLORIDE (POUR BTL) OPTIME
TOPICAL | Status: DC | PRN
  Administered 2014-06-24: 500 mL

## 2014-06-24 MED ORDER — PANTOPRAZOLE SODIUM 40 MG PO TBEC
40.0000 mg | DELAYED_RELEASE_TABLET | Freq: Every day | ORAL | Status: DC
  Administered 2014-06-24 – 2014-07-01 (×8): 40 mg via ORAL
  Filled 2014-06-24 (×6): qty 1

## 2014-06-24 MED ORDER — PHENYLEPHRINE HCL 10 MG/ML IJ SOLN
INTRAMUSCULAR | Status: AC
  Filled 2014-06-24: qty 1

## 2014-06-24 MED ORDER — PANTOPRAZOLE SODIUM 40 MG PO TBEC: 40.0000 mg | DELAYED_RELEASE_TABLET | Freq: Every day | ORAL | Status: DC | PRN

## 2014-06-24 MED ORDER — ADULT MULTIVITAMIN W/MINERALS CH
1.0000 | ORAL_TABLET | Freq: Every day | ORAL | Status: DC
  Administered 2014-06-24 – 2014-07-01 (×8): 1 via ORAL
  Filled 2014-06-24 (×8): qty 1

## 2014-06-24 MED ORDER — LABETALOL HCL 5 MG/ML IV SOLN
10.0000 mg | INTRAVENOUS | Status: DC | PRN
  Filled 2014-06-24: qty 4

## 2014-06-24 MED ORDER — LIDOCAINE HCL (CARDIAC) 20 MG/ML IV SOLN
INTRAVENOUS | Status: DC | PRN
  Administered 2014-06-24: 80 mg via INTRAVENOUS

## 2014-06-24 MED ORDER — ALBUTEROL SULFATE (2.5 MG/3ML) 0.083% IN NEBU
3.0000 mL | INHALATION_SOLUTION | Freq: Four times a day (QID) | RESPIRATORY_TRACT | Status: DC | PRN
  Administered 2014-06-29 – 2014-06-30 (×2): 3 mL via RESPIRATORY_TRACT
  Filled 2014-06-24 (×2): qty 3

## 2014-06-24 MED ORDER — ACETAMINOPHEN 325 MG PO TABS
325.0000 mg | ORAL_TABLET | ORAL | Status: DC | PRN
  Administered 2014-07-01: 650 mg via ORAL
  Filled 2014-06-24: qty 2

## 2014-06-24 MED ORDER — HEPARIN (PORCINE) IN NACL 100-0.45 UNIT/ML-% IJ SOLN
2150.0000 [IU]/h | INTRAMUSCULAR | Status: DC
  Administered 2014-06-24: 1300 [IU]/h via INTRAVENOUS
  Administered 2014-06-26 – 2014-06-27 (×2): 1600 [IU]/h via INTRAVENOUS
  Administered 2014-06-28: 1900 [IU]/h via INTRAVENOUS
  Administered 2014-06-28 – 2014-06-29 (×2): 2150 [IU]/h via INTRAVENOUS
  Filled 2014-06-24 (×11): qty 250

## 2014-06-24 MED ORDER — ROCURONIUM BROMIDE 50 MG/5ML IV SOLN
INTRAVENOUS | Status: AC
  Filled 2014-06-24: qty 1

## 2014-06-24 MED ORDER — PROTAMINE SULFATE 10 MG/ML IV SOLN
INTRAVENOUS | Status: AC
  Filled 2014-06-24: qty 5

## 2014-06-24 MED ORDER — DOPAMINE-DEXTROSE 3.2-5 MG/ML-% IV SOLN
0.0000 ug/kg/min | INTRAVENOUS | Status: DC
  Administered 2014-06-25: 3 ug/kg/min via INTRAVENOUS
  Administered 2014-06-26: 8 ug/kg/min via INTRAVENOUS
  Filled 2014-06-24 (×2): qty 250

## 2014-06-24 MED ORDER — OMEGA-3 FATTY ACIDS 1000 MG PO CAPS: 2.0000 g | ORAL_CAPSULE | Freq: Every day | ORAL | Status: DC

## 2014-06-24 MED ORDER — PHENOL 1.4 % MT LIQD: 1.0000 | OROMUCOSAL | Status: DC | PRN

## 2014-06-24 MED ORDER — HYDROMORPHONE HCL 1 MG/ML IJ SOLN: 0.2500 mg | INTRAMUSCULAR | Status: DC | PRN

## 2014-06-24 MED ORDER — OXYCODONE HCL 5 MG/5ML PO SOLN: 5.0000 mg | Freq: Once | ORAL | Status: DC | PRN

## 2014-06-24 MED ORDER — EPHEDRINE SULFATE 50 MG/ML IJ SOLN
INTRAMUSCULAR | Status: AC
  Filled 2014-06-24: qty 1

## 2014-06-24 MED ORDER — NALOXONE HCL 0.4 MG/ML IJ SOLN
INTRAMUSCULAR | Status: AC
  Filled 2014-06-24: qty 1

## 2014-06-24 MED ORDER — DIGOXIN 125 MCG PO TABS
0.1250 mg | ORAL_TABLET | Freq: Every day | ORAL | Status: DC
  Administered 2014-06-24 – 2014-07-01 (×8): 0.125 mg via ORAL
  Filled 2014-06-24 (×8): qty 1

## 2014-06-24 MED ORDER — FENTANYL CITRATE 0.05 MG/ML IJ SOLN
INTRAMUSCULAR | Status: AC
  Filled 2014-06-24: qty 5

## 2014-06-24 MED ORDER — ROCURONIUM BROMIDE 100 MG/10ML IV SOLN
INTRAVENOUS | Status: DC | PRN
  Administered 2014-06-24: 30 mg via INTRAVENOUS

## 2014-06-24 MED ORDER — OXYCODONE HCL 5 MG PO TABS
5.0000 mg | ORAL_TABLET | Freq: Once | ORAL | Status: DC | PRN
Start: 2014-06-24 — End: 2014-06-24

## 2014-06-24 MED ORDER — SUCCINYLCHOLINE CHLORIDE 20 MG/ML IJ SOLN
INTRAMUSCULAR | Status: DC | PRN
  Administered 2014-06-24: 100 mg via INTRAVENOUS

## 2014-06-24 MED ORDER — CEFAZOLIN SODIUM-DEXTROSE 2-3 GM-% IV SOLR
INTRAVENOUS | Status: DC | PRN
  Administered 2014-06-24: 2 g via INTRAVENOUS

## 2014-06-24 MED ORDER — OXYCODONE-ACETAMINOPHEN 5-325 MG PO TABS
1.0000 | ORAL_TABLET | ORAL | Status: DC | PRN
  Administered 2014-06-25 (×2): 1 via ORAL
  Administered 2014-06-25: 2 via ORAL
  Administered 2014-06-25: 1 via ORAL
  Administered 2014-06-28 – 2014-06-30 (×2): 2 via ORAL
  Filled 2014-06-24 (×4): qty 2
  Filled 2014-06-24 (×2): qty 1

## 2014-06-24 MED ORDER — DEXTROSE 5 % IV SOLN
1.5000 g | Freq: Two times a day (BID) | INTRAVENOUS | Status: AC
  Administered 2014-06-24 – 2014-06-25 (×2): 1.5 g via INTRAVENOUS
  Filled 2014-06-24 (×2): qty 1.5

## 2014-06-24 MED ORDER — PROTAMINE SULFATE 10 MG/ML IV SOLN
INTRAVENOUS | Status: DC | PRN
  Administered 2014-06-24: 30 mg via INTRAVENOUS

## 2014-06-24 MED ORDER — ARTIFICIAL TEARS OP OINT
TOPICAL_OINTMENT | OPHTHALMIC | Status: AC
  Filled 2014-06-24: qty 3.5

## 2014-06-24 MED ORDER — LOSARTAN POTASSIUM 25 MG PO TABS
25.0000 mg | ORAL_TABLET | Freq: Every day | ORAL | Status: DC
  Administered 2014-06-24 – 2014-07-01 (×7): 25 mg via ORAL
  Filled 2014-06-24 (×8): qty 1

## 2014-06-24 MED ORDER — POTASSIUM CHLORIDE CRYS ER 20 MEQ PO TBCR
20.0000 meq | EXTENDED_RELEASE_TABLET | Freq: Every day | ORAL | Status: DC
  Administered 2014-06-24 – 2014-07-01 (×8): 20 meq via ORAL
  Filled 2014-06-24 (×9): qty 1

## 2014-06-24 MED ORDER — IOHEXOL 300 MG/ML  SOLN
INTRAMUSCULAR | Status: DC | PRN
  Administered 2014-06-24: 20 mL via INTRAVENOUS

## 2014-06-24 MED ORDER — SUCCINYLCHOLINE CHLORIDE 20 MG/ML IJ SOLN
INTRAMUSCULAR | Status: AC
  Filled 2014-06-24: qty 1

## 2014-06-24 MED ORDER — MAGNESIUM SULFATE 40 MG/ML IJ SOLN
2.0000 g | Freq: Every day | INTRAMUSCULAR | Status: DC | PRN
  Filled 2014-06-24: qty 50

## 2014-06-24 MED ORDER — METOPROLOL TARTRATE 1 MG/ML IV SOLN: 2.0000 mg | INTRAVENOUS | Status: DC | PRN

## 2014-06-24 MED ORDER — GUAIFENESIN-DM 100-10 MG/5ML PO SYRP: 15.0000 mL | ORAL_SOLUTION | ORAL | Status: DC | PRN

## 2014-06-24 MED ORDER — MIDAZOLAM HCL 2 MG/2ML IJ SOLN
INTRAMUSCULAR | Status: AC
  Filled 2014-06-24: qty 2

## 2014-06-24 MED ORDER — ALUM & MAG HYDROXIDE-SIMETH 200-200-20 MG/5ML PO SUSP
15.0000 mL | ORAL | Status: DC | PRN
  Administered 2014-06-25 – 2014-06-26 (×2): 30 mL via ORAL
  Filled 2014-06-24 (×2): qty 30

## 2014-06-24 MED ORDER — ISOSORBIDE DINITRATE ER 40 MG PO CPCR
40.0000 mg | ORAL_CAPSULE | Freq: Three times a day (TID) | ORAL | Status: DC
  Administered 2014-06-24 – 2014-06-27 (×6): 40 mg via ORAL
  Filled 2014-06-24 (×12): qty 1

## 2014-06-24 MED ORDER — NITROGLYCERIN 0.4 MG SL SUBL: 0.4000 mg | SUBLINGUAL_TABLET | SUBLINGUAL | Status: DC | PRN

## 2014-06-24 MED ORDER — ASPIRIN 81 MG PO CHEW
81.0000 mg | CHEWABLE_TABLET | Freq: Every day | ORAL | Status: DC
  Administered 2014-06-24 – 2014-06-29 (×6): 81 mg via ORAL
  Filled 2014-06-24 (×5): qty 1

## 2014-06-24 MED ORDER — SPIRONOLACTONE 25 MG PO TABS
25.0000 mg | ORAL_TABLET | Freq: Every day | ORAL | Status: DC
  Administered 2014-06-24 – 2014-07-01 (×8): 25 mg via ORAL
  Filled 2014-06-24 (×8): qty 1

## 2014-06-24 MED ORDER — PROPOFOL 10 MG/ML IV BOLUS
INTRAVENOUS | Status: DC | PRN
  Administered 2014-06-24: 140 mg via INTRAVENOUS

## 2014-06-24 MED ORDER — INDOMETHACIN 25 MG PO CAPS
25.0000 mg | ORAL_CAPSULE | Freq: Three times a day (TID) | ORAL | Status: DC
  Administered 2014-06-24 – 2014-06-26 (×10): 25 mg via ORAL
  Filled 2014-06-24 (×15): qty 1

## 2014-06-24 MED ORDER — EPHEDRINE SULFATE 50 MG/ML IJ SOLN
INTRAMUSCULAR | Status: DC | PRN
  Administered 2014-06-24 (×2): 5 mg via INTRAVENOUS

## 2014-06-24 MED ORDER — FUROSEMIDE 40 MG PO TABS
60.0000 mg | ORAL_TABLET | Freq: Two times a day (BID) | ORAL | Status: DC
  Administered 2014-06-24 – 2014-06-26 (×5): 60 mg via ORAL
  Filled 2014-06-24 (×8): qty 1

## 2014-06-24 MED ORDER — NEOSTIGMINE METHYLSULFATE 10 MG/10ML IV SOLN
INTRAVENOUS | Status: AC
  Filled 2014-06-24: qty 1

## 2014-06-24 MED ORDER — PROMETHAZINE HCL 25 MG/ML IJ SOLN: 6.2500 mg | INTRAMUSCULAR | Status: DC | PRN

## 2014-06-24 MED ORDER — DOCUSATE SODIUM 100 MG PO CAPS
100.0000 mg | ORAL_CAPSULE | Freq: Every day | ORAL | Status: DC
  Administered 2014-06-25 – 2014-07-01 (×6): 100 mg via ORAL
  Filled 2014-06-24 (×7): qty 1

## 2014-06-24 MED ORDER — ONDANSETRON HCL 4 MG/2ML IJ SOLN
INTRAMUSCULAR | Status: DC | PRN
  Administered 2014-06-24: 4 mg via INTRAVENOUS

## 2014-06-24 MED ORDER — MIDAZOLAM HCL 5 MG/5ML IJ SOLN
INTRAMUSCULAR | Status: DC | PRN
  Administered 2014-06-24: 1 mg via INTRAVENOUS

## 2014-06-24 MED ORDER — METOPROLOL SUCCINATE ER 100 MG PO TB24
200.0000 mg | ORAL_TABLET | Freq: Every day | ORAL | Status: DC
  Administered 2014-06-24 – 2014-06-25 (×2): 200 mg via ORAL
  Filled 2014-06-24 (×4): qty 2

## 2014-06-24 MED ORDER — LACTATED RINGERS IV SOLN
INTRAVENOUS | Status: DC | PRN
  Administered 2014-06-24: 12:00:00 via INTRAVENOUS

## 2014-06-24 MED ORDER — EZETIMIBE 10 MG PO TABS
10.0000 mg | ORAL_TABLET | Freq: Every day | ORAL | Status: DC
  Administered 2014-06-24 – 2014-07-01 (×8): 10 mg via ORAL
  Filled 2014-06-24 (×8): qty 1

## 2014-06-24 MED ORDER — HEPARIN SODIUM (PORCINE) 1000 UNIT/ML IJ SOLN
INTRAMUSCULAR | Status: DC | PRN
  Administered 2014-06-24: 8000 [IU] via INTRAVENOUS

## 2014-06-24 MED ORDER — HYDRALAZINE HCL 50 MG PO TABS
75.0000 mg | ORAL_TABLET | Freq: Three times a day (TID) | ORAL | Status: DC
  Administered 2014-06-24: 75 mg via ORAL
  Filled 2014-06-24 (×11): qty 1

## 2014-06-24 MED ORDER — PAPAVERINE HCL 30 MG/ML IJ SOLN
INTRAMUSCULAR | Status: AC
  Filled 2014-06-24: qty 2

## 2014-06-24 MED ORDER — DEXTROSE-NACL 5-0.45 % IV SOLN
INTRAVENOUS | Status: DC
  Administered 2014-06-24 – 2014-06-26 (×2): via INTRAVENOUS

## 2014-06-24 MED ORDER — ACETAMINOPHEN 650 MG RE SUPP
325.0000 mg | RECTAL | Status: DC | PRN
Start: 2014-06-24 — End: 2014-07-01

## 2014-06-24 MED ORDER — PHENYLEPHRINE HCL 10 MG/ML IJ SOLN
10.0000 mg | INTRAMUSCULAR | Status: DC | PRN
  Administered 2014-06-24: 10 ug/min via INTRAVENOUS

## 2014-06-24 MED ORDER — HEPARIN SODIUM (PORCINE) 1000 UNIT/ML IJ SOLN
INTRAMUSCULAR | Status: AC
  Filled 2014-06-24: qty 1

## 2014-06-24 MED ORDER — SODIUM CHLORIDE 0.9 % IV SOLN: 500.0000 mL | Freq: Once | INTRAVENOUS | Status: AC | PRN

## 2014-06-24 MED ORDER — ONDANSETRON HCL 4 MG/2ML IJ SOLN
INTRAMUSCULAR | Status: AC
  Filled 2014-06-24: qty 2

## 2014-06-24 MED ORDER — ALLOPURINOL 100 MG PO TABS
100.0000 mg | ORAL_TABLET | Freq: Every day | ORAL | Status: DC
  Administered 2014-06-25 – 2014-07-01 (×7): 100 mg via ORAL
  Filled 2014-06-24 (×7): qty 1

## 2014-06-24 MED ORDER — STERILE WATER FOR INJECTION IJ SOLN
INTRAMUSCULAR | Status: AC
  Filled 2014-06-24: qty 10

## 2014-06-24 MED ORDER — POTASSIUM CHLORIDE CRYS ER 20 MEQ PO TBCR: 20.0000 meq | EXTENDED_RELEASE_TABLET | Freq: Every day | ORAL | Status: DC | PRN

## 2014-06-24 MED ORDER — THROMBIN 20000 UNITS EX SOLR
CUTANEOUS | Status: AC
  Filled 2014-06-24: qty 20000

## 2014-06-24 MED ORDER — SODIUM CHLORIDE 0.9 % IR SOLN
Status: DC | PRN
  Administered 2014-06-24: 13:00:00

## 2014-06-24 MED ORDER — HYDRALAZINE HCL 20 MG/ML IJ SOLN: 10.0000 mg | INTRAMUSCULAR | Status: DC | PRN

## 2014-06-24 MED ORDER — CEFAZOLIN SODIUM-DEXTROSE 2-3 GM-% IV SOLR
INTRAVENOUS | Status: AC
  Filled 2014-06-24: qty 50

## 2014-06-24 MED ORDER — ETOMIDATE 2 MG/ML IV SOLN
INTRAVENOUS | Status: AC
  Filled 2014-06-24: qty 10

## 2014-06-24 MED ORDER — PAPAVERINE HCL 30 MG/ML IJ SOLN
INTRAMUSCULAR | Status: DC | PRN
  Administered 2014-06-24: 60 mg via INTRAVENOUS

## 2014-06-24 MED ORDER — OMEGA-3-ACID ETHYL ESTERS 1 G PO CAPS
2.0000 g | ORAL_CAPSULE | Freq: Every day | ORAL | Status: DC
Start: 2014-06-25 — End: 2014-07-01
  Administered 2014-06-25 – 2014-07-01 (×7): 2 g via ORAL
  Filled 2014-06-24 (×7): qty 2

## 2014-06-24 MED ORDER — ALBUMIN HUMAN 5 % IV SOLN
INTRAVENOUS | Status: DC | PRN
  Administered 2014-06-24: 12:00:00 via INTRAVENOUS

## 2014-06-24 MED ORDER — NEOSTIGMINE METHYLSULFATE 10 MG/10ML IV SOLN
INTRAVENOUS | Status: DC | PRN
  Administered 2014-06-24: 4 mg via INTRAVENOUS

## 2014-06-24 MED ORDER — PROPOFOL 10 MG/ML IV BOLUS
INTRAVENOUS | Status: AC
  Filled 2014-06-24: qty 20

## 2014-06-24 MED ORDER — COLCHICINE 0.6 MG PO TABS
0.6000 mg | ORAL_TABLET | Freq: Every day | ORAL | Status: DC | PRN
  Filled 2014-06-24: qty 1

## 2014-06-24 MED ORDER — FENTANYL CITRATE 0.05 MG/ML IJ SOLN
INTRAMUSCULAR | Status: DC | PRN
  Administered 2014-06-24 (×3): 50 ug via INTRAVENOUS

## 2014-06-24 MED ORDER — GLYCOPYRROLATE 0.2 MG/ML IJ SOLN
INTRAMUSCULAR | Status: DC | PRN
  Administered 2014-06-24: 0.6 mg via INTRAVENOUS

## 2014-06-24 MED ORDER — CLOPIDOGREL BISULFATE 75 MG PO TABS
75.0000 mg | ORAL_TABLET | Freq: Every day | ORAL | Status: DC
  Administered 2014-06-24 – 2014-07-01 (×8): 75 mg via ORAL
  Filled 2014-06-24 (×8): qty 1

## 2014-06-24 SURGICAL SUPPLY — 66 items
ADH SKN CLS APL DERMABOND .7 (GAUZE/BANDAGES/DRESSINGS) ×4
ADH SKN CLS LQ APL DERMABOND (GAUZE/BANDAGES/DRESSINGS) ×4
BANDAGE ESMARK 6X9 LF (GAUZE/BANDAGES/DRESSINGS) IMPLANT
BLADE SURG 10 STRL SS (BLADE) ×3 IMPLANT
BNDG CMPR 9X6 STRL LF SNTH (GAUZE/BANDAGES/DRESSINGS)
BNDG ESMARK 6X9 LF (GAUZE/BANDAGES/DRESSINGS)
CANISTER SUCTION 2500CC (MISCELLANEOUS) ×6 IMPLANT
CANNULA VESSEL 3MM 2 BLNT TIP (CANNULA) ×9 IMPLANT
CANNULA VESSEL W/WING WO/VALVE (CANNULA) ×12 IMPLANT
CATH EMB 3FR 80CM (CATHETERS) ×3 IMPLANT
CATH EMB 4FR 80CM (CATHETERS) ×3 IMPLANT
CATH EMB 5FR 80CM (CATHETERS) IMPLANT
CLIP TI MEDIUM 24 (CLIP) ×6 IMPLANT
CLIP TI WIDE RED SMALL 24 (CLIP) ×6 IMPLANT
COVER SURGICAL LIGHT HANDLE (MISCELLANEOUS) ×6 IMPLANT
CUFF TOURNIQUET SINGLE 18IN (TOURNIQUET CUFF) IMPLANT
CUFF TOURNIQUET SINGLE 24IN (TOURNIQUET CUFF) IMPLANT
CUFF TOURNIQUET SINGLE 34IN LL (TOURNIQUET CUFF) IMPLANT
CUFF TOURNIQUET SINGLE 44IN (TOURNIQUET CUFF) IMPLANT
DERMABOND ADHESIVE PROPEN (GAUZE/BANDAGES/DRESSINGS) ×2
DERMABOND ADVANCED (GAUZE/BANDAGES/DRESSINGS) ×2
DERMABOND ADVANCED .7 DNX12 (GAUZE/BANDAGES/DRESSINGS) ×4 IMPLANT
DERMABOND ADVANCED .7 DNX6 (GAUZE/BANDAGES/DRESSINGS) ×1 IMPLANT
DRAIN CHANNEL 15F RND FF W/TCR (WOUND CARE) ×3 IMPLANT
DRAPE WARM FLUID 44X44 (DRAPE) ×6 IMPLANT
DRAPE X-RAY CASS 24X20 (DRAPES) ×3 IMPLANT
DRSG COVADERM 4X10 (GAUZE/BANDAGES/DRESSINGS) ×6 IMPLANT
ELECT REM PT RETURN 9FT ADLT (ELECTROSURGICAL) ×6
ELECTRODE REM PT RTRN 9FT ADLT (ELECTROSURGICAL) ×4 IMPLANT
EVACUATOR SILICONE 100CC (DRAIN) ×3 IMPLANT
GAUZE SPONGE 4X4 12PLY STRL (GAUZE/BANDAGES/DRESSINGS) ×3 IMPLANT
GAUZE SPONGE 4X4 16PLY XRAY LF (GAUZE/BANDAGES/DRESSINGS) IMPLANT
GLOVE BIO SURGEON STRL SZ7.5 (GLOVE) ×6 IMPLANT
GLOVE BIOGEL PI IND STRL 8 (GLOVE) ×4 IMPLANT
GLOVE BIOGEL PI INDICATOR 8 (GLOVE) ×2
GOWN STRL REUS W/ TWL LRG LVL3 (GOWN DISPOSABLE) ×12 IMPLANT
GOWN STRL REUS W/TWL LRG LVL3 (GOWN DISPOSABLE) ×18
INSERT FOGARTY SM (MISCELLANEOUS) ×9 IMPLANT
KIT BASIN OR (CUSTOM PROCEDURE TRAY) ×6 IMPLANT
KIT ROOM TURNOVER OR (KITS) ×6 IMPLANT
NS IRRIG 1000ML POUR BTL (IV SOLUTION) ×12 IMPLANT
PACK PERIPHERAL VASCULAR (CUSTOM PROCEDURE TRAY) ×6 IMPLANT
PAD ARMBOARD 7.5X6 YLW CONV (MISCELLANEOUS) ×12 IMPLANT
PATCH VASCULAR VASCU GUARD 1X6 (Vascular Products) ×3 IMPLANT
SET COLLECT BLD 21X3/4 12 (NEEDLE) ×3 IMPLANT
SPONGE SURGIFOAM ABS GEL 100 (HEMOSTASIS) IMPLANT
STAPLER VISISTAT (STAPLE) ×3 IMPLANT
STOPCOCK 4 WAY LG BORE MALE ST (IV SETS) ×3 IMPLANT
SUT ETHILON 3 0 PS 1 (SUTURE) ×3 IMPLANT
SUT PROLENE 5 0 C 1 24 (SUTURE) ×12 IMPLANT
SUT PROLENE 6 0 BV (SUTURE) IMPLANT
SUT SILK 2 0 FS (SUTURE) IMPLANT
SUT SILK 2 0 SH (SUTURE) ×6 IMPLANT
SUT VIC AB 2-0 CTB1 (SUTURE) ×12 IMPLANT
SUT VIC AB 3-0 SH 27 (SUTURE) ×12
SUT VIC AB 3-0 SH 27X BRD (SUTURE) ×8 IMPLANT
SUT VICRYL 4-0 PS2 18IN ABS (SUTURE) ×12 IMPLANT
SYR 3ML LL SCALE MARK (SYRINGE) ×6 IMPLANT
SYR TB 1ML LUER SLIP (SYRINGE) ×3 IMPLANT
TAPE CLOTH SURG 4X10 WHT LF (GAUZE/BANDAGES/DRESSINGS) ×3 IMPLANT
TOWEL OR 17X24 6PK STRL BLUE (TOWEL DISPOSABLE) ×12 IMPLANT
TOWEL OR 17X26 10 PK STRL BLUE (TOWEL DISPOSABLE) ×6 IMPLANT
TRAY FOLEY CATH 16FRSI W/METER (SET/KITS/TRAYS/PACK) ×6 IMPLANT
TUBING EXTENTION W/L.L. (IV SETS) ×3 IMPLANT
UNDERPAD 30X30 INCONTINENT (UNDERPADS AND DIAPERS) ×6 IMPLANT
WATER STERILE IRR 1000ML POUR (IV SOLUTION) ×6 IMPLANT

## 2014-06-24 NOTE — ED Notes (Addendum)
Per EMS:   Patient transferred by [place] from [facility] for right foot pain, cool to touch, no DP pulse.  Pain started at 0400 this am, no pain when he went to bed last night.  Patient is due for lung biopsy on 23rd on left lung, unknown what mass is.  Was taken off plavix 2 days ago for procedure.  Patient initially thought pain was from gout.  Took Advil and colchiceine.   Right foot is cool, no right DP pulses, no right PT pulse, right popliteal pulse present but weak.  Skin is cool to proximal of right knee.    Was bolused 7500 units of heparin at [facility], placed on 1500 unit/hour drip of heparin en route with [facility].  Was given 10 mg of morphine by [place], given another 6 mg morphine en route with [facility].  Pain has decreased from 15/10 to a 10/10.  Patient appears to be resting.  Hx of CAD, HTN, Gout, high cholesterol, unsure of nature of lung biopsy as far as what the mass is.    Has not eaten anything this am.

## 2014-06-24 NOTE — Anesthesia Postprocedure Evaluation (Signed)
  Anesthesia Post-op Note  Patient: Adam House  Procedure(s) Performed: Procedure(s) with comments: RIGHT FEMORAL EMBOLECTOMY,   (Right) - Right Femoral Embolectomy, with bovine patch angioplasty,. ANGIOGRAM EXTREMITY RIGHT (Right) - Intraoperative angiogram right lower extremity. FASCIOTOMY (Left) - four compartment Fasciotomy.  Patient Location: PACU  Anesthesia Type:General  Level of Consciousness: awake and alert   Airway and Oxygen Therapy: Patient Spontanous Breathing  Post-op Pain: mild  Post-op Assessment: Post-op Vital signs reviewed and Patient's Cardiovascular Status Stable  Post-op Vital Signs: stable  Last Vitals:  Filed Vitals:   06/24/14 1411  BP: 115/76  Pulse: 85  Temp:   Resp: 17    Complications: No apparent anesthesia complications

## 2014-06-24 NOTE — H&P (Addendum)
[facility] [place]  Patient name: JONTAVIOUS COMMONS MRN: [id number] DOB: 1950/11/13 Sex: male  REASON FOR ADMISSION: Acute arterial occlusion right lower extremity  HPI: Adam House is a 63 y.o. male developed the sudden onset of pain and paresthesias in his right foot at 4 AM this morning. He was evaluated at [facility] and sent to [facility] with an ischemic right lower extremity. I do not get any clear-cut history of claudication or rest pain prior to this event. He has no history of atrial fibrillation. He does have a defibrillator it was placed and approximately 2010. He's had 2 previous myocardial infarctions one in 1998 and most recently in 2013. He does have ischemic cardiomyopathy with EF of 25-30%.   His chief complaint is severe pain in the right foot with paresthesias and decreased motor function.  Of note, he was seen by Dr. Roxan Hockey on 06/20/2014 because CT scan of the chest had shown interstitial lung disease. Dr. Roxan Hockey noted that he had pulmonary parenchymal fibrosis and interstitial pneumonitis. He was being scheduled for a thorascopic lung biopsy for a definitive diagnosis.  He was also seen in March of this year by Dr. Kellie Simmering falls and with carotid disease. He has a known right internal carotid artery occlusion with a 40-59% left carotid stenosis. At the time of his most recent office visit he had palpable femoral pulses.  Past Medical History  Diagnosis Date  . CAD (coronary artery disease)     a. ant MI 1998 with PCI to LAD. b. Ant-lat MI 04/2012 tx with overlapping DES to LAD.  [name] Systolic CHF     NYHA Class II/III  . Ischemic cardiomyopathy     EF 25-30% by echo 04/2012; [place]  . CRI (chronic renal insufficiency)   . Unspecified essential hypertension   . Impotence of organic origin   . Esophageal reflux   . Tobacco use disorder   . ICD (implantable cardiac defibrillator) in place   .  Hypercholesteremia   . Heart murmur   . CVA (cerebral infarction) 11/2010  . Gout   . 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 Lung disease, interstitial    Family History  Problem Relation Age of Onset  . Diabetes Mother   . Heart disease Father    SOCIAL HISTORY: History  Substance Use Topics  . Smoking status: Former Smoker -- 0.50 packs/day for 40 years    Types: Cigarettes    Quit date: 02/13/2014  . Smokeless tobacco: Current User    Types: Snuff  . Alcohol Use: No     Comment: 08/24/2012 "used to drink; not anymore; stopped > 25yrs ago; never had any problems w/it"   Allergies  Allergen Reactions  . Lisinopril Anaphylaxis and Swelling    angioedema  . Naproxen Hives   No current facility-administered medications for this encounter.   Current Outpatient Prescriptions  Medication Sig Dispense Refill  . albuterol (PROVENTIL HFA;VENTOLIN HFA) 108 (90 BASE) MCG/ACT inhaler Inhale 2 puffs into the lungs every 6 (six) hours as needed. For shortness of breath      . allopurinol (ZYLOPRIM) 100 MG tablet Take 100 mg by mouth daily.      [name] aspirin 81 MG tablet Take 81 mg by mouth daily.      . clopidogrel (PLAVIX) 75 MG tablet Take 75 mg by mouth daily.      . colchicine 0.6 MG tablet Take 1 tablet (0.6 mg total)  by mouth daily as needed. For gout  30 tablet  11  . digoxin (LANOXIN) 0.125 MG tablet TAKE (1) TABLET BY MOUTH ONCE DAILY.  30 tablet  0  . ezetimibe (ZETIA) 10 MG tablet Take 1 tablet (10 mg total) by mouth daily.  28 tablet  0  . fish oil-omega-3 fatty acids 1000 MG capsule Take 2 g by mouth daily.      . furosemide (LASIX) 40 MG tablet Take 1.5 tablets (60 mg total) by mouth 2 (two) times daily.  90 tablet  6  . hydrALAZINE (APRESOLINE) 25 MG tablet Take 3 tablets (75 mg total) by mouth 3 (three) times daily.  270 tablet  6  . indomethacin (INDOCIN) 25 MG capsule Take 25 mg by mouth every 6 (six) hours.      . isosorbide dinitrate (DILATRATE-SR) 40 MG CR capsule Take 1  capsule (40 mg total) by mouth 3 (three) times daily.  90 capsule  6  . losartan (COZAAR) 25 MG tablet Take 1 tablet (25 mg total) by mouth daily.  30 tablet  6  . metoprolol succinate (TOPROL-XL) 200 MG 24 hr tablet Take 1 tablet (200 mg total) by mouth daily.  30 tablet  6  . multivitamin (ONE-A-DAY MEN'S) TABS Take 1 tablet by mouth daily.      . nitroGLYCERIN (NITROSTAT) 0.4 MG SL tablet Place 1 tablet (0.4 mg total) under the tongue every 5 (five) minutes as needed for chest pain (up to 3 doses).      . pantoprazole (PROTONIX) 40 MG tablet Take 1 tablet (40 mg total) by mouth as needed.  30 tablet  6  . potassium chloride SA (KLOR-CON M20) 20 MEQ tablet Take 1 tablet (20 mEq total) by mouth daily.  30 tablet  6  . spironolactone (ALDACTONE) 25 MG tablet Take 25 mg by mouth daily.       REVIEW OF SYSTEMS: Valu.Nieves ] denotes positive finding; [  ] denotes negative finding CARDIOVASCULAR:  [ ]  chest pain   [ ]  chest pressure   [ ]  palpitations   [ ]  orthopnea   Valu.Nieves ] dyspnea on exertion   [ ]  claudication   [ ]  rest pain   [ ]  DVT   [ ]  phlebitis PULMONARY:   [ ]  productive cough   [ ]  asthma   Valu.Nieves ] wheezing NEUROLOGIC:   Valu.Nieves ] weakness  Valu.Nieves ] paresthesias  [ ]  aphasia  [ ]  amaurosis  [ ]  dizziness HEMATOLOGIC:   [ ]  bleeding problems   [ ]  clotting disorders MUSCULOSKELETAL:  [ ]  joint pain   [ ]  joint swelling [ ]  leg swelling GASTROINTESTINAL: [ ]   blood in stool  [ ]   hematemesis GENITOURINARY:  [ ]   dysuria  [ ]   hematuria PSYCHIATRIC:  [ ]  history of major depression INTEGUMENTARY:  [ ]  rashes  [ ]  ulcers CONSTITUTIONAL:  [ ]  fever   [ ]  chills  PHYSICAL EXAM: Filed Vitals:   06/24/14 0929 06/24/14 0930 06/24/14 0943 06/24/14 0945  BP: 152/90 125/67  123/75  Pulse: 73 71  72  Temp: 97.9 F (36.6 C)     TempSrc: Oral     Resp: 18 15  19   Height:   5' 9.5" (1.765 m)   Weight:   196 lb (88.905 kg)   SpO2: 90% 85%  99%   Body mass index is 28.54 kg/(m^2). GENERAL: The patient is a  well-nourished male, in no acute distress.  The vital signs are documented above. CARDIOVASCULAR: There is a regular rate and rhythm. I do not detect carotid bruits. He has a palpable left femoral pulse. I cannot palpate a right femoral pulse. He has no pulses in the right lower extremity. He has a palpable left popliteal left dorsalis pedis and left posterior tibial pulse. He has no significant lower extremity swelling. The right foot is cool with decreased motor and sensory function. I am unable to get any doppler signals in the right foot.  PULMONARY: There is good air exchange bilaterally without wheezing or rales. ABDOMEN: Soft and non-tender with normal pitched bowel sounds. I do not palpate an AAA. MUSCULOSKELETAL: There are no major deformities. NEUROLOGIC: Decrease motor and sensory function of the right lower extremity. SKIN: There are no ulcers or rashes noted. PSYCHIATRIC: The patient has a normal affect.  DATA:  Lab Results  Component Value Date   WBC 5.5 06/24/2014   HGB 15.0 06/24/2014   HCT 44.0 06/24/2014   MCV 83.2 06/24/2014   PLT 249 06/24/2014   Lab Results  Component Value Date   NA 141 06/24/2014   K 3.2* 06/24/2014   CL 103 06/24/2014   CO2 23 07/21/2013   Lab Results  Component Value Date   CREATININE 1.60* 06/24/2014   Lab Results  Component Value Date   INR 1.0 ratio 12/03/2009   No results found for this basename: HGBA1C   CBG (last 3)  No results found for this basename: GLUCAP,  in the last 72 hours  MEDICAL ISSUES:  ACUTE ARTERIAL OCCLUSION RIGHT LOWER EXTREMITY: The patient developed acute ischemia of the right lower extremity at 4 AM. The etiology of his acute arterial occlusion is not clear. He does have a history of ischemic cardiomyopathy and most likely this is a cardiac embolus. He does not have a history to suggest underlying significant peripheral vascular disease, however, he has multiple risk factors including a history of tobacco use,  hypertension, and coronary artery disease. There is also a family history of premature cardiovascular disease. Thus he could potentially have thrombosed a chronically atherosclerotic stenosis. Unlikely would be embolization from a proximal aneurysm. I have recommended emergent femoral embolectomy. I've explained that if we are unable to establish inflow on the right he could potentially require a left to right fem-fem bypass. In addition, if he has significant infrainguinal arterial occlusive disease and we are unable to establish flow to the right foot he could potentially require a bypass in the right lower extremity. I also discussed the potential need for fasciotomy. I've explained that this is clearly a limb threatening situation. Without revascularization he would likely require limb loss. We have discussed the potential complications of the procedure including but not limited to bleeding, wound healing problems, infection and limb loss. All of his questions were answered and we will proceed urgently.  [place] [facility] [place]per: [phone]

## 2014-06-24 NOTE — Op Note (Signed)
NAME: Adam House   MRN: [id number] DOB: 1951/04/06    DATE OF OPERATION: 06/24/2014  PREOP DIAGNOSIS: ischemic right lower extremity  POSTOP DIAGNOSIS: same  PROCEDURE:  1. Right femoral embolectomy 2. Bovine pericardial patch angioplasty of the right common femoral artery 3. Intraoperative arteriogram 4. 4 compartment fasciotomy  SURGEON: Judeth Cornfield. Scot Dock, MD, FACS  ASSIST: none  ANESTHESIA: Gen.   EBL: 100 cc  INDICATIONS: Adam House is a 63 y.o. male he developed the sudden onset of pain and paresthesias in his right foot at 4 AM. He originally presented to [facility] and was subsequently transferred to [place] with an acutely ischemic right lower extremity. The etiology of his acute arterial occlusion is not clear. He does have a history of ischemic cardiomyopathy with an ejection fraction of 25-30%. He denied any significant claudication prior to this event. The patient was taken urgently to the operating room for attempted revascularization.  FINDINGS: a large amount of fresh thrombus was retrieved from the iliac artery proximally and also from the superficial femoral artery distally. The clot appeared fairly fresh. There was mild irregularity and pulling the catheter through the iliac artery. The Fogarty catheter passed the entire length into the lower leg without significant obstruction noted. Completion arteriogram suggested significant spasm of the tibial vessels versus some chronic occlusion.  TECHNIQUE: The patient was taken to the operating room and received a general anesthetic. The entire right lower extremity and also the left groin were prepped and draped in the usual sterile fashion. A longitudinal incision was made in the right groin and through this incision, the common femoral, superficial femoral, and deep femoral arteries were dissected free and controlled with vessel loops. There was also a large posterior branch which  was controlled. The patient was heparinized. A longitudinal arteriotomy was made in the common femoral artery. The #4 Fogarty catheter was passed up the iliac artery and a large amount of fresh clot was retrieved. Catheter was passed multiple times until no further clot was retrieved. There was some mild irregularity noted within the iliac artery and pulling the catheter through the artery. The catheter was then directed down the deep femoral artery and there was no clot in the deep femoral artery. The catheter was then directed down the superficial femoral artery and passed fairly easily. Large amount of clot was also retrieved distally. This clot also appeared fresh. The cath was passed multiple times until no further clot was retrieved. Also using #3 Fogarty catheter and no further clot was retrieved. The catheter passed the entire length without obstruction.  Next a bovine pericardial patch had been soaked and this was cut to the appropriate size and then sewn as a patch using continuous 6-0 Prolene suture. Prior to completing the patch closure the inflow was tested and there was good inflow. The anastomosis was completed and flow reestablished to the right leg. At the completion was a good anterior tibial signal at the Doppler but I was unable to obtain a posterior tibial or peroneal signal. An intraoperative arteriogram was obtained which showed severe tibial disease possibly related to spasm or possibly some chronic disease. MRI did not see any evidence of obvious retained clot.  The cath was moderately swollen and given the length of time that the leg was ischemic I elected to proceed with 4 compartment fasciotomy. A longitudinal incision was made over the lateral aspect of the right leg in the lateral compartment was decompressed there was  moderate swelling. The skin was then retracted anteriorly and the anterior compartment exposed. The fascia was opened and again there was moderate swelling. I felt  that I could close the skin without excess tension and this was therefore closed with staples after hemostasis was obtained.  Next a medial incision was made in longitudinal he in the posterior compartment and deep posterior compartment were exposed. The deep posterior compartment was exposed by opening the soleus muscle and the underlying fascia. There was mild swelling in the deep compartment and the posterior compartment. Again hemostasis was obtained and the skin was closed with staples as this did not require excess tension.  Given that the patient would likely be anticoagulated postoperatively, I elected to place a 15 Blake drain. This was secured with a nylon stitch. Hemostasis was obtained in the wound and the wound was then closed the deep layer of 2-0 Vicryl, subcutaneous layer of 200, and the skin closed with a 4-0 subcuticular stitch. Dermabond was applied. The patient tolerated the procedure well and was transferred to the recovery room in stable condition. All needle and sponge counts were correct.  Deitra Mayo, MD, FACS [facility]  DATE OF DICTATION:   06/24/2014

## 2014-06-24 NOTE — ED Notes (Signed)
Pt arrived already in gown; placed on monitor, continuous pulse oximetry and blood pressure cuff; warm blanket given

## 2014-06-24 NOTE — Anesthesia Preprocedure Evaluation (Signed)
Anesthesia Evaluation  Patient identified by MRN, date of birth, ID band Patient awake    Reviewed: Allergy & Precautions, H&P , NPO status , Patient's Chart, lab work & pertinent test results  Airway       Dental   Pulmonary shortness of breath, former smoker,          Cardiovascular hypertension, + CAD, + Past MI, + Peripheral Vascular Disease and +CHF + Cardiac Defibrillator     Neuro/Psych    GI/Hepatic GERD-  ,  Endo/Other    Renal/GU Renal InsufficiencyRenal disease     Musculoskeletal   Abdominal   Peds  Hematology   Anesthesia Other Findings   Reproductive/Obstetrics                           Anesthesia Physical Anesthesia Plan  ASA: III  Anesthesia Plan: General   Post-op Pain Management:    Induction: Intravenous  Airway Management Planned:   Additional Equipment: Arterial line  Intra-op Plan:   Post-operative Plan: Extubation in OR  Informed Consent: I have reviewed the patients History and Physical, chart, labs and discussed the procedure including the risks, benefits and alternatives for the proposed anesthesia with the patient or authorized representative who has indicated his/her understanding and acceptance.   Dental advisory given  Plan Discussed with:   Anesthesia Plan Comments:         Anesthesia Quick Evaluation

## 2014-06-24 NOTE — ED Provider Notes (Signed)
CSN: [id number]     Arrival date & time 06/24/14  6270 History   First MD Initiated Contact with Patient 06/24/14 [phone]     Chief Complaint  Patient presents with  . Claudication    right foot cool, no pedal pulses     (Consider location/radiation/quality/duration/timing/severity/associated sxs/prior Treatment) The history is provided by the patient.   patient transferred from [facility] for ischemic right lower extremity. Started on heparin prehospital. Pain began a few hours prior to arrival. [place] get him at night. He states it is a dull pain. No previous vascular issues of his legs. No chest pain. Some chronic trouble breathing. He has been on Plavix, but has been off it for a few days for a lung biopsy. No fevers  Past Medical History  Diagnosis Date  . CAD (coronary artery disease)     a. ant MI 1998 with PCI to LAD. b. Ant-lat MI 04/2012 tx with overlapping DES to LAD.  [name] Systolic CHF     NYHA Class II/III  . Ischemic cardiomyopathy     EF 25-30% by echo 04/2012; [place]  . CRI (chronic renal insufficiency)   . Unspecified essential hypertension   . Impotence of organic origin   . Esophageal reflux   . Tobacco use disorder   . ICD (implantable cardiac defibrillator) in place   . Hypercholesteremia   . Heart murmur   . CVA (cerebral infarction) 11/2010  . Gout   . 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 Lung disease, interstitial    Past Surgical History  Procedure Laterality Date  . Anterior cervical decomp/discectomy fusion  2007    "put in 4 screws to hold my head on; ruptured C5; job related injury" (08/24/2012)  . Cardiac defibrillator placement  2011    [employer]  he is a MADIT RIT study patient  . Coronary angioplasty with stent placement  05/1997; 04/2012    "1 + 2; total of 3" (08/24/2012)   Family History  Problem Relation Age of Onset  . Diabetes Mother   . Heart disease Father    History  Substance Use Topics  . Smoking status:  Former Smoker -- 0.50 packs/day for 40 years    Types: Cigarettes    Quit date: 02/13/2014  . Smokeless tobacco: Current User    Types: Snuff  . Alcohol Use: No     Comment: 08/24/2012 "used to drink; not anymore; stopped > 90yrs ago; never had any problems w/it"    Review of Systems  Constitutional: Negative for diaphoresis.  Respiratory: Negative for shortness of breath.   Cardiovascular: Negative for leg swelling.  Gastrointestinal: Negative for abdominal pain and constipation.  Skin: Positive for color change and pallor. Negative for rash and wound.  Neurological: Negative for syncope and weakness.      Allergies  Lisinopril and Naproxen  Home Medications   Prior to Admission medications   Medication Sig Start Date End Date Taking? Authorizing Provider  albuterol (PROVENTIL HFA;VENTOLIN HFA) 108 (90 BASE) MCG/ACT inhaler Inhale 2 puffs into the lungs every 6 (six) hours as needed for wheezing or shortness of breath. For shortness of breath   Yes Historical Provider, MD  allopurinol (ZYLOPRIM) 100 MG tablet Take 100 mg by mouth daily.   Yes Historical Provider, MD  aspirin 81 MG tablet Take 81 mg by mouth daily.    Historical Provider, MD  clopidogrel (PLAVIX) 75 MG tablet Take 75 mg by mouth daily.  Historical Provider, MD  colchicine 0.6 MG tablet Take 1 tablet (0.6 mg total) by mouth daily as needed. For gout 04/27/12   Evelene Croon Barrett, PA-C  digoxin (LANOXIN) 0.125 MG tablet TAKE (1) TABLET BY MOUTH ONCE DAILY. 09/20/13   Herminio Commons, MD  ezetimibe (ZETIA) 10 MG tablet Take 1 tablet (10 mg total) by mouth daily. 04/24/14   Herminio Commons, MD  fish oil-omega-3 fatty acids 1000 MG capsule Take 2 g by mouth daily.    Historical Provider, MD  furosemide (LASIX) 40 MG tablet Take 1.5 tablets (60 mg total) by mouth 2 (two) times daily. 08/25/13   Thompson Grayer, MD  hydrALAZINE (APRESOLINE) 25 MG tablet Take 3 tablets (75 mg total) by mouth 3 (three) times daily. 10/06/12    Larey Dresser, MD  indomethacin (INDOCIN) 25 MG capsule Take 25 mg by mouth every 6 (six) hours.    Historical Provider, MD  isosorbide dinitrate (DILATRATE-SR) 40 MG CR capsule Take 1 capsule (40 mg total) by mouth 3 (three) times daily. 10/06/12   Larey Dresser, MD  losartan (COZAAR) 25 MG tablet Take 1 tablet (25 mg total) by mouth daily. 11/23/12   Larey Dresser, MD  metoprolol succinate (TOPROL-XL) 200 MG 24 hr tablet Take 1 tablet (200 mg total) by mouth daily. 03/15/13   Larey Dresser, MD  multivitamin (ONE-A-DAY MEN'S) TABS Take 1 tablet by mouth daily.    Historical Provider, MD  nitroGLYCERIN (NITROSTAT) 0.4 MG SL tablet Place 1 tablet (0.4 mg total) under the tongue every 5 (five) minutes as needed for chest pain (up to 3 doses). 08/27/12 10/12/13  [name] N Dunn, PA-C  pantoprazole (PROTONIX) 40 MG tablet Take 1 tablet (40 mg total) by mouth as needed. 08/19/13   Herminio Commons, MD  potassium chloride SA (KLOR-CON M20) 20 MEQ tablet Take 1 tablet (20 mEq total) by mouth daily. 07/07/13   Thompson Grayer, MD  spironolactone (ALDACTONE) 25 MG tablet Take 25 mg by mouth daily.    Historical Provider, MD   BP 111/69  Pulse 83  Temp(Src) 97.4 F (36.3 C) (Oral)  Resp 11  Ht 5' 9.5" (1.765 m)  Wt 204 lb 2.3 oz (92.6 kg)  BMI 29.72 kg/m2  SpO2 94% Physical Exam  Constitutional: He appears well-developed and well-nourished.  Cardiovascular: Normal rate and regular rhythm.   Pulmonary/Chest: Effort normal.  Abdominal: Soft. There is no tenderness.  Musculoskeletal: Normal range of motion.  No palpable or dopplerable pulses to right foot, either at dorsalis pedis or posterior tibial. There is a Doppler pulse in his popliteal area on the right leg. Cool lower leg on the right. Palpable pulses in the foot on left.  Neurological: He is alert.  Skin: Skin is warm.    ED Course  Procedures (including critical care time) Labs Review Labs Reviewed  CBC WITH DIFFERENTIAL - Abnormal;  Notable for the following:    RDW 17.1 (*)    Monocytes Relative 19 (*)    Monocytes Absolute 1.1 (*)    All other components within normal limits  DIGOXIN LEVEL - Abnormal; Notable for the following:    Digoxin Level <0.3 (*)    All other components within normal limits  I-STAT CHEM 8, ED - Abnormal; Notable for the following:    Potassium 3.2 (*)    Creatinine, Ser 1.60 (*)    Glucose, Bld 117 (*)    All other components within normal limits  CBC  BASIC METABOLIC PANEL  PREPARE RBC (CROSSMATCH)  TYPE AND SCREEN  ABO/RH    Imaging Review No results found.   EKG Interpretation   Date/Time:  Saturday June 24 2014 09:39:42 EDT Ventricular Rate:  73 PR Interval:  224 QRS Duration: 95 QT Interval:  424 QTC Calculation: 467 R Axis:   -49 Text Interpretation:  Sinus rhythm Prolonged PR interval Left atrial  enlargement Inferior infarct, old Anterolateral infarct, age indeterminate  No significant change since last tracing Confirmed by Telitha Plath  MD,  Ovid Curd [phone]) on 06/24/2014 4:18:55 PM      MDM   Final diagnoses:  Critical ischemia of lower extremity    Patient with ischemic foot, transfer from outside hospital. To be admitted by vascular surgery.    Jasper Riling. Alvino Chapel, MD 06/24/14 1620

## 2014-06-24 NOTE — ED Notes (Signed)
Dr. Alvino Chapel at bedside

## 2014-06-24 NOTE — Anesthesia Procedure Notes (Signed)
Procedure Name: Intubation Date/Time: 06/24/2014 12:12 PM Performed by: Maude Leriche D Pre-anesthesia Checklist: Patient identified, Emergency Drugs available, Suction available, Patient being monitored and Timeout performed Patient Re-evaluated:Patient Re-evaluated prior to inductionOxygen Delivery Method: Circle system utilized Preoxygenation: Pre-oxygenation with 100% oxygen Intubation Type: IV induction Ventilation: Mask ventilation without difficulty Laryngoscope Size: Miller and 2 Grade View: Grade I Tube type: Oral Tube size: 7.5 mm Number of attempts: 1 Airway Equipment and Method: Stylet Placement Confirmation: ETT inserted through vocal cords under direct vision,  positive ETCO2 and breath sounds checked- equal and bilateral Secured at: 22 cm Tube secured with: Tape Dental Injury: Teeth and Oropharynx as per pre-operative assessment

## 2014-06-24 NOTE — ED Notes (Addendum)
Vascular Surgery at bedside

## 2014-06-24 NOTE — Transfer of Care (Signed)
Immediate Anesthesia Transfer of Care Note  Patient: Adam House  Procedure(s) Performed: Procedure(s) with comments: RIGHT FEMORAL EMBOLECTOMY,   (Right) - Right Femoral Embolectomy, with bovine patch angioplasty,. ANGIOGRAM EXTREMITY RIGHT (Right) - Intraoperative angiogram right lower extremity. FASCIOTOMY (Left) - four compartment Fasciotomy.  Patient Location: PACU  Anesthesia Type:General  Level of Consciousness: awake  Airway & Oxygen Therapy: Patient Spontanous Breathing and Patient connected to nasal cannula oxygen  Post-op Assessment: Report given to PACU RN and Post -op Vital signs reviewed and stable  Post vital signs: Reviewed and stable  Complications: No apparent anesthesia complications

## 2014-06-24 NOTE — Progress Notes (Signed)
ANTICOAGULATION CONSULT NOTE - Initial Consult  Pharmacy Consult for Heparin Indication: RLE ischemia s/p embolectomy  Allergies  Allergen Reactions  . Lisinopril Anaphylaxis and Swelling    angioedema  . Naproxen Hives    Patient Measurements: Height: 5' 9.5" (176.5 cm) Weight: 204 lb 2.3 oz (92.6 kg) IBW/kg (Calculated) : 71.85 Heparin Dosing Weight: 92 kg  Vital Signs: Temp: 97.4 F (36.3 C) (10/10 1550) Temp Source: Oral (10/10 1550) BP: 111/69 mmHg (10/10 1550) Pulse Rate: 83 (10/10 1550)  Labs:  Recent Labs  06/24/14 1000 06/24/14 1010  HGB 14.1 15.0  HCT 40.7 44.0  PLT 249  --   CREATININE  --  1.60*    Estimated Creatinine Clearance: 53.6 ml/min (by C-G formula based on Cr of 1.6).   Medical History: Past Medical History  Diagnosis Date  . CAD (coronary artery disease)     a. ant MI 1998 with PCI to LAD. b. Ant-lat MI 04/2012 tx with overlapping DES to LAD.  [name] Systolic CHF     NYHA Class II/III  . Ischemic cardiomyopathy     EF 25-30% by echo 04/2012; [facility]  . CRI (chronic renal insufficiency)   . Unspecified essential hypertension   . Impotence of organic origin   . Esophageal reflux   . Tobacco use disorder   . ICD (implantable cardiac defibrillator) in place   . Hypercholesteremia   . Heart murmur   . CVA (cerebral infarction) 11/2010  . Gout   . 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 Lung disease, interstitial     Medications:  Prescriptions prior to admission  Medication Sig Dispense Refill  . albuterol (PROVENTIL HFA;VENTOLIN HFA) 108 (90 BASE) MCG/ACT inhaler Inhale 2 puffs into the lungs every 6 (six) hours as needed for wheezing or shortness of breath. For shortness of breath      . allopurinol (ZYLOPRIM) 100 MG tablet Take 100 mg by mouth daily.      [name] aspirin 81 MG tablet Take 81 mg by mouth daily.      . clopidogrel (PLAVIX) 75 MG tablet Take 75 mg by mouth daily.      . colchicine 0.6 MG tablet Take 1 tablet (0.6 mg  total) by mouth daily as needed. For gout  30 tablet  11  . digoxin (LANOXIN) 0.125 MG tablet TAKE (1) TABLET BY MOUTH ONCE DAILY.  30 tablet  0  . ezetimibe (ZETIA) 10 MG tablet Take 1 tablet (10 mg total) by mouth daily.  28 tablet  0  . fish oil-omega-3 fatty acids 1000 MG capsule Take 2 g by mouth daily.      . furosemide (LASIX) 40 MG tablet Take 1.5 tablets (60 mg total) by mouth 2 (two) times daily.  90 tablet  6  . hydrALAZINE (APRESOLINE) 25 MG tablet Take 3 tablets (75 mg total) by mouth 3 (three) times daily.  270 tablet  6  . indomethacin (INDOCIN) 25 MG capsule Take 25 mg by mouth every 6 (six) hours.      . isosorbide dinitrate (DILATRATE-SR) 40 MG CR capsule Take 1 capsule (40 mg total) by mouth 3 (three) times daily.  90 capsule  6  . losartan (COZAAR) 25 MG tablet Take 1 tablet (25 mg total) by mouth daily.  30 tablet  6  . metoprolol succinate (TOPROL-XL) 200 MG 24 hr tablet Take 1 tablet (200 mg total) by mouth daily.  30 tablet  6  . multivitamin (ONE-A-DAY MEN'S)  TABS Take 1 tablet by mouth daily.      . nitroGLYCERIN (NITROSTAT) 0.4 MG SL tablet Place 1 tablet (0.4 mg total) under the tongue every 5 (five) minutes as needed for chest pain (up to 3 doses).      . pantoprazole (PROTONIX) 40 MG tablet Take 1 tablet (40 mg total) by mouth as needed.  30 tablet  6  . potassium chloride SA (KLOR-CON M20) 20 MEQ tablet Take 1 tablet (20 mEq total) by mouth daily.  30 tablet  6  . spironolactone (ALDACTONE) 25 MG tablet Take 25 mg by mouth daily.        Assessment: 63 y.o. male presents from [place] with RLE ischemia. Heparin started at [facility]. Pt s/p R femoral embolectomy today. Heparin off for procedure. Pharmacy to resume heparin post embolectomy at 1900. CBC stable at baseline.  Goal of Therapy:  Heparin level 0.3-0.7 units/ml Monitor platelets by anticoagulation protocol: Yes   Plan:  1. At 1900, start heparin gtt at 1300 units/hr 2. Will f/u heparin  level, CBC at 0300 tomorrow 3. Daily heparin level and CBC  Sherlon Handing, PharmD, BCPS Clinical pharmacist, pager [phone] 06/24/2014,4:42 PM

## 2014-06-24 NOTE — ED Notes (Addendum)
Heparin Stopped per Dr. Scot Dock.

## 2014-06-25 ENCOUNTER — Inpatient Hospital Stay (HOSPITAL_COMMUNITY): Payer: BC Managed Care – PPO

## 2014-06-25 ENCOUNTER — Encounter (HOSPITAL_COMMUNITY): Payer: Self-pay

## 2014-06-25 LAB — CBC
HEMATOCRIT: 34.2 % — AB (ref 39.0–52.0)
Hemoglobin: 11.6 g/dL — ABNORMAL LOW (ref 13.0–17.0)
MCH: 28 pg (ref 26.0–34.0)
MCHC: 33.9 g/dL (ref 30.0–36.0)
MCV: 82.6 fL (ref 78.0–100.0)
Platelets: 237 10*3/uL (ref 150–400)
RBC: 4.14 MIL/uL — ABNORMAL LOW (ref 4.22–5.81)
RDW: 17.1 % — AB (ref 11.5–15.5)
WBC: 8.4 10*3/uL (ref 4.0–10.5)

## 2014-06-25 LAB — BASIC METABOLIC PANEL
Anion gap: 13 (ref 5–15)
BUN: 13 mg/dL (ref 6–23)
CO2: 21 mEq/L (ref 19–32)
CREATININE: 1.61 mg/dL — AB (ref 0.50–1.35)
Calcium: 7.9 mg/dL — ABNORMAL LOW (ref 8.4–10.5)
Chloride: 100 mEq/L (ref 96–112)
GFR calc non Af Amer: 44 mL/min — ABNORMAL LOW (ref >90)
GFR, EST AFRICAN AMERICAN: 51 mL/min — AB (ref >90)
Glucose, Bld: 109 mg/dL — ABNORMAL HIGH (ref 70–99)
Potassium: 3.6 mEq/L — ABNORMAL LOW (ref 3.7–5.3)
Sodium: 134 mEq/L — ABNORMAL LOW (ref 137–147)

## 2014-06-25 LAB — HEPARIN LEVEL (UNFRACTIONATED)
Heparin Unfractionated: 0.12 IU/mL — ABNORMAL LOW (ref 0.30–0.70)
Heparin Unfractionated: 0.34 IU/mL (ref 0.30–0.70)

## 2014-06-25 MED ORDER — HYDROMORPHONE HCL 1 MG/ML IJ SOLN
1.0000 mg | INTRAMUSCULAR | Status: DC | PRN
  Administered 2014-06-25: 1 mg via INTRAVENOUS
  Filled 2014-06-25: qty 1

## 2014-06-25 NOTE — Progress Notes (Signed)
   VASCULAR SURGERY ASSESSMENT & PLAN:  * 1 Day Post-Op s/p: PROCEDURE:   1. Right femoral embolectomy   2. Bovine pericardial patch angioplasty of the right common femoral artery   3. Intraoperative arteriogram   4. 4 compartment fasciotomy   * Given decreased plantar and dorsi flexion, I removed the staples from the lateral fasciotomy site. Muscle looks well perfused. BID dressing changes  * Source of embolus is not clear. Will order 2D-Echo tomorrow and consult Cardiology tomorrow for recommendations on anticoagulation.   * JP = 55 cc for 8 hours. Leave JP for now until drainage less than 30 cc/ 8 hours.   * Crt is stable at 1.6.    SUBJECTIVE: Pain adequatley controlled.   PHYSICAL EXAM: Filed Vitals:   06/25/14 0014 06/25/14 0300 06/25/14 0445 06/25/14 0700  BP: 114/66 109/66 102/75 101/64  Pulse: 94 92 97 95  Temp: 98.3 F (36.8 C)  98.4 F (36.9 C)   TempSrc: Oral  Oral   Resp: 17 29 23 22   Height:      Weight:      SpO2: 100% 100% 100% 100%   Palpable right DP pulse. Moderate calf swelling. Decreased plantar and dorsi flexion of right foot Sensation intact 1st web space.   LABS: Lab Results  Component Value Date   WBC 8.4 06/25/2014   HGB 11.6* 06/25/2014   HCT 34.2* 06/25/2014   MCV 82.6 06/25/2014   PLT 237 06/25/2014   Lab Results  Component Value Date   CREATININE 1.61* 06/25/2014   Lab Results  Component Value Date   INR 1.0 ratio 12/03/2009     Active Problems:   Ischemia of lower extremity   Gae Gallop Beeper: [phone] 06/25/2014

## 2014-06-25 NOTE — Progress Notes (Signed)
ANTICOAGULATION CONSULT NOTE - Follow Up Consult  Pharmacy Consult for heparin Indication: RLE ischemia s/p embolectomy  Labs:  Recent Labs  06/24/14 1000 06/24/14 1010 06/25/14 0300 06/25/14 0315  HGB 14.1 15.0 11.6*  --   HCT 40.7 44.0 34.2*  --   PLT 249  --  237  --   HEPARINUNFRC  --   --   --  0.12*  CREATININE  --  1.60*  --   --     Assessment: 63yo male subtherapeutic on heparin with initial dosing for RLE ischemia s/p embolectomy.  Goal of Therapy:  Heparin level 0.3-0.7 units/ml   Plan:  Will increase heparin gtt by 3 units/kg/hr to 1600 units/hr and check level in 6hr.  Wynona Neat, PharmD, BCPS  06/25/2014,4:13 AM

## 2014-06-25 NOTE — Progress Notes (Signed)
Pts BP 89/51. Administered 1 583mL bolus NS per MD order. Will continue to monitor.

## 2014-06-25 NOTE — Progress Notes (Signed)
Around 1230 this morning PT was working with pt. At this time pt could not coordinate his left arm movements. PT called me into the room. At this time pt was layed back on the bed, proceeded to breathe very fast and became unresponsive for less than a minute. When pt became responsive again he was completely alert and oriented. Notified Dr. Scot Dock of this and received orders for a CT head, carotid doppler and to hold pts transfer. Will continue to monitor pt.

## 2014-06-25 NOTE — Progress Notes (Signed)
Pts BP 86/58 post 527mL bolus. Will hang 571mL bolus #2. Will continue to monitor.

## 2014-06-25 NOTE — Evaluation (Addendum)
Physical Therapy Evaluation Patient Details Name: Adam House MRN: [id number] DOB: 1951-05-11 Today's Date: 06/25/2014   History of Present Illness   63 y.o. male developed the sudden onset of pain and paresthesias in his right foot at 4 AM this morning. He was evaluated at [facility] and sent to [facility] with an ischemic right lower extremity.  Pt is s/p Rt femoral embolectomy . PMH includes CAD, CHF, Ischemic cardiomyopathy, ICD, Gout.   Clinical Impression  Pt s/p surgery listed above. PT evaluation limited due to non-responsive episode when attempting to stand. Pt initially was alert, oriented, verbalizing and responding to commands appropriately. With attempt to stand PT and tech noted pt having difficulty coordinating Lt UE and then began slurring speech, O2 sats then began to drop (unable to assess waveform on pulse ox at the time). Pt demo short, shallow breathing with RR increasing to 50. Pt BP in supine 92/51.  Pt immediately (A) to supine position; pt unresponsive with Rt lateral eye gaze and RN brought into room. Pt with non purposeful movements of all limbs but limited in Lt UE. Pt became alert and oriented after couple seconds and sternal rub, RN performing assessment on pt and calling for orders. No D/C disposition given at this time due to limited evaluation. Pt to benefit from skilled acute PT to maximize functional mobility prior to D/C home. Will check pt medical status and activity orders if status changes.     Follow Up Recommendations Other (comment) (TBD )    Equipment Recommendations  Other (comment) (TBD)    Recommendations for Other Services OT consult     Precautions / Restrictions Precautions Precautions: Fall Restrictions Weight Bearing Restrictions: Yes RLE Weight Bearing: Weight bearing as tolerated      Mobility  Bed Mobility Overal bed mobility: Needs Assistance Bed Mobility: Supine to Sit;Sit to Supine     Supine to sit:  Supervision;HOB elevated Sit to supine: +2 for physical assistance;Total assist (due to non reponsive episode at EOB)   General bed mobility comments: initially pt follow commands and able to sit EOB with use of handrails and incr time due to pain in Rt LE; upon sitting EOB and attempting to stand pt became unresponsive and devolped difficulty speaking and moving Lt UE which required immedaite return to supine with 2 person (A)   Transfers Overall transfer level: Needs assistance Equipment used: Rolling walker (2 wheeled) Transfers: Sit to/from Stand Sit to Stand: +2 physical assistance;Max assist         General transfer comment: attempted sit to stand with 2 person (A); pt powered up halfway off bed and demo difficulty following commands, sequencing Lt UE, difficulty speaking and O2 sats showing significant decreased in 50% range; pt required immediate return to supine and RN notified and in room   Ambulation/Gait             General Gait Details: unable to assess this session  Stairs            Wheelchair Mobility    Modified Rankin (Stroke Patients Only)       Balance Overall balance assessment: Needs assistance Sitting-balance support: Feet supported Sitting balance-Leahy Scale: Fair Sitting balance - Comments: fair initially; no UE support;       Standing balance comment: unable to achieve; pt became unresponsive with attempt to stand  Pertinent Vitals/Pain Pain Assessment: 0-10 Pain Score: 5  Pain Location: Rt LE  Pain Descriptors / Indicators: Burning;Operative site guarding Pain Intervention(s): Limited activity within patient's tolerance;Monitored during session;Premedicated before session    [name] expects to be discharged to:: Private residence Living Arrangements: Spouse/significant other Available Help at Discharge: Family;Available 24 hours/day Type of Home: House Home Access:  Stairs to enter Entrance Stairs-Rails: Can reach both;Left;Right Entrance Stairs-Number of Steps: 5-6 Home Layout: Multi-level;Bed/bath upstairs Home Equipment: Walker - 2 wheels;Cane - single point      Prior Function Level of Independence: Independent with assistive device(s)         Comments: users AD due to gout flare ups      Hand Dominance        Extremity/Trunk Assessment   Upper Extremity Assessment: Defer to OT evaluation           Lower Extremity Assessment: RLE deficits/detail RLE Deficits / Details: pt very sensitive to light touch; denied any numbness; hip grossly 3/5     Cervical / Trunk Assessment: Normal  Communication   Communication: No difficulties  Cognition Arousal/Alertness: Awake/alert (then became lethargic during session) Behavior During Therapy: Flat affect Overall Cognitive Status: Impaired/Different from baseline Area of Impairment: Safety/judgement;Following commands;Problem solving;Awareness;Attention   Current Attention Level: Focused   Following Commands: Follows one step commands inconsistently;Follows one step commands with increased time Safety/Judgement: Decreased awareness of deficits;Decreased awareness of safety   Problem Solving: Slow processing;Decreased initiation;Difficulty sequencing;Requires verbal cues;Requires tactile cues General Comments: initially pt was appropriate and following commands; when episode on EOB occurred pt became impaired as stated above    General Comments General comments (skin integrity, edema, etc.): see impression statement for unresponsive episode     Exercises General Exercises - Lower Extremity Ankle Circles/Pumps: AROM;Both;10 reps;Supine      Assessment/Plan    PT Assessment Patient needs continued PT services  PT Diagnosis Difficulty walking;Generalized weakness;Acute pain;Altered mental status   PT Problem List Decreased strength;Decreased range of motion;Decreased activity  tolerance;Decreased balance;Decreased mobility;Decreased cognition;Decreased knowledge of use of DME;Decreased safety awareness;Decreased knowledge of precautions;Pain;Impaired sensation  PT Treatment Interventions DME instruction;Gait training;Stair training;Functional mobility training;Therapeutic activities;Therapeutic exercise;Balance training;Neuromuscular re-education;Patient/family education   PT Goals (Current goals can be found in the Care Plan section) Acute Rehab PT Goals Patient Stated Goal: to go home with wife PT Goal Formulation: With patient Time For Goal Achievement: 07/02/14 Potential to Achieve Goals: Good    Frequency Min 3X/week   Barriers to discharge Decreased caregiver support wife cannot provide physical (A); pt needs to be mod I to D/C home    Co-evaluation               End of Session Equipment Utilized During Treatment: Gait belt Activity Tolerance: Treatment limited secondary to medical complications (Comment) (see impression statement) Patient left: in bed;with call bell/phone within reach;with nursing/sitter in room;with bed alarm set Nurse Communication: Mobility status;Precautions         Time: 5885-0277 PT Time Calculation (min): 22 min   Charges:   PT Evaluation $Initial PT Evaluation Tier I: 1 Procedure PT Treatments $Therapeutic Activity: 8-22 mins   PT G CodesGustavus Bryant, [place]  [phone] 06/25/2014, 12:30 PM

## 2014-06-25 NOTE — Progress Notes (Signed)
Pts BP still in the 80's. Notified Dr. Scot Dock and he said to go ahead and start the dopamine drip. Will continue to monitor pt.

## 2014-06-25 NOTE — Progress Notes (Signed)
Dual chamber ICD check.  Normal device function.  Sinus rhythm at 75bpm. Atrial lead:  5.61mV, 500. 1.0V@0 .34ms Ventricular lead:  10.45mV, 437, 1.0V@0 .22ms Shock lead:  50  Last treated episode was Oct 5.  Twelve seconds of VT treated with ATP.  Note/printouts left with chart.  [facility] [employer] [phone]

## 2014-06-25 NOTE — Progress Notes (Signed)
ANTICOAGULATION CONSULT NOTE - Follow Up Consult  Pharmacy Consult for Heparin Indication: RLE ischemia s/p embolectomy  Allergies  Allergen Reactions  . Lisinopril Anaphylaxis and Swelling    angioedema  . Naproxen Hives    Patient Measurements: Height: 5' 9.5" (176.5 cm) Weight: 204 lb 2.3 oz (92.6 kg) IBW/kg (Calculated) : 71.85 Heparin Dosing Weight: 91 kg  Vital Signs: Temp: 98.1 F (36.7 C) (10/11 0824) Temp Source: Oral (10/11 0824) BP: 91/57 mmHg (10/11 1145) Pulse Rate: 94 (10/11 1145)  Labs:  Recent Labs  06/24/14 1000 06/24/14 1010 06/25/14 0300 06/25/14 0315 06/25/14 1045  HGB 14.1 15.0 11.6*  --   --   HCT 40.7 44.0 34.2*  --   --   PLT 249  --  237  --   --   HEPARINUNFRC  --   --   --  0.12* 0.34  CREATININE  --  1.60*  --  1.61*  --     Estimated Creatinine Clearance: 53.3 ml/min (by C-G formula based on Cr of 1.61).   Medications:  Scheduled:  . allopurinol  100 mg Oral Daily  . aspirin  81 mg Oral Daily  . clopidogrel  75 mg Oral Daily  . digoxin  0.125 mg Oral Daily  . docusate sodium  100 mg Oral Daily  . ezetimibe  10 mg Oral Daily  . furosemide  60 mg Oral BID  . hydrALAZINE  75 mg Oral TID  . indomethacin  25 mg Oral TID WC & HS  . isosorbide dinitrate  40 mg Oral TID  . losartan  25 mg Oral Daily  . metoprolol succinate  200 mg Oral Daily  . multivitamin with minerals  1 tablet Oral Daily  . omega-3 acid ethyl esters  2 g Oral Daily  . pantoprazole  40 mg Oral Daily  . potassium chloride SA  20 mEq Oral Daily  . spironolactone  25 mg Oral Daily   Infusions:  . dextrose 5 % and 0.45% NaCl 75 mL/hr at 06/24/14 1755  . DOPamine    . heparin 1,600 Units/hr (06/25/14 1100)   Assessment: Adam House is a 63 yo mail presenting from [place] with RLE ischemia. Heparin was started at [facility]. Pt s/p R femoral embolectomy. Pharmacy to resume heparin post embolectomy.  Heparin level was therapeutic at 0.34 @  1045 today.  Will continue current heparin rate.  H/H has dropped, likely d/t procedure.  Will continue to monitor for s/sx of bleeding.  Hgb 11.6, Hct 11.6, Plt wnl.    Goal of Therapy:  Heparin level 0.3-0.7 units/ml Monitor platelets by anticoagulation protocol: Yes   Plan:  1. Continue heparin gtt at 1600 units/hr 2. F/u heparin level in AM 3. Daily heparin level and CBC 4. Monitor closely for s/sx of bleeding  Hassie Bruce, Pharm. D. Clinical Pharmacy Resident Pager: [phone] Ph: [phone] 06/25/2014 12:19 PM

## 2014-06-26 ENCOUNTER — Encounter (HOSPITAL_COMMUNITY): Payer: Self-pay | Admitting: Vascular Surgery

## 2014-06-26 DIAGNOSIS — I5022 Chronic systolic (congestive) heart failure: Secondary | ICD-10-CM

## 2014-06-26 DIAGNOSIS — I998 Other disorder of circulatory system: Secondary | ICD-10-CM

## 2014-06-26 DIAGNOSIS — M6281 Muscle weakness (generalized): Secondary | ICD-10-CM

## 2014-06-26 LAB — HEPARIN LEVEL (UNFRACTIONATED): Heparin Unfractionated: 0.44 IU/mL (ref 0.30–0.70)

## 2014-06-26 LAB — CBC
HCT: 30.7 % — ABNORMAL LOW (ref 39.0–52.0)
Hemoglobin: 10.3 g/dL — ABNORMAL LOW (ref 13.0–17.0)
MCH: 27.4 pg (ref 26.0–34.0)
MCHC: 33.6 g/dL (ref 30.0–36.0)
MCV: 81.6 fL (ref 78.0–100.0)
Platelets: 216 10*3/uL (ref 150–400)
RBC: 3.76 MIL/uL — AB (ref 4.22–5.81)
RDW: 16.9 % — ABNORMAL HIGH (ref 11.5–15.5)
WBC: 7.9 10*3/uL (ref 4.0–10.5)

## 2014-06-26 NOTE — Progress Notes (Signed)
Utilization review completed.  

## 2014-06-26 NOTE — Consult Note (Signed)
CONSULT NOTE  Date: 06/26/2014               Patient Name:  Adam House MRN: [id number]  DOB: [birth year] Age / Sex: 63 y.o., male        PCP: [facility] Primary Cardiologist: Bronson Ing            Referring Physician: Scot Dock              Reason for Consult: Ischemia cardiomyopathy, arterial embolus            History of Present Illness: Patient is a 63 y.o. male with a PMHx of chronic systolic congestive heart failure (EF 25-30% with apical akinesis)  , peripheral vascular disease, essential hypertension, history of CVA, interstitial lung disease who was admitted to [facility] on 06/24/2014 for evaluation of ischemic right foot  At the time of surgery Dr. Scot Dock extracted a large, fresh thromboembolus.   Consulted for further management of his congestive heart failure and to give recommendations regarding anticoagulation.   Medications: Outpatient medications: Prescriptions prior to admission  Medication Sig Dispense Refill  . albuterol (PROVENTIL HFA;VENTOLIN HFA) 108 (90 BASE) MCG/ACT inhaler Inhale 2 puffs into the lungs every 6 (six) hours as needed for wheezing or shortness of breath. For shortness of breath      . allopurinol (ZYLOPRIM) 100 MG tablet Take 100 mg by mouth daily.      [name] aspirin 81 MG tablet Take 81 mg by mouth daily.      . clopidogrel (PLAVIX) 75 MG tablet Take 75 mg by mouth daily.      . colchicine 0.6 MG tablet Take 1 tablet (0.6 mg total) by mouth daily as needed. For gout  30 tablet  11  . digoxin (LANOXIN) 0.125 MG tablet Take 0.125 mg by mouth daily.      [name] ezetimibe (ZETIA) 10 MG tablet Take 1 tablet (10 mg total) by mouth daily.  28 tablet  0  . fish oil-omega-3 fatty acids 1000 MG capsule Take 2 g by mouth daily.      . furosemide (LASIX) 40 MG tablet Take 1.5 tablets (60 mg total) by mouth 2 (two) times daily.  90 tablet  6  . hydrALAZINE (APRESOLINE) 25 MG tablet Take 3 tablets (75 mg total) by mouth 3 (three) times daily.  270  tablet  6  . indomethacin (INDOCIN) 25 MG capsule Take 25 mg by mouth every 6 (six) hours.      . isosorbide dinitrate (DILATRATE-SR) 40 MG CR capsule Take 1 capsule (40 mg total) by mouth 3 (three) times daily.  90 capsule  6  . losartan (COZAAR) 25 MG tablet Take 1 tablet (25 mg total) by mouth daily.  30 tablet  6  . metoprolol succinate (TOPROL-XL) 200 MG 24 hr tablet Take 1 tablet (200 mg total) by mouth daily.  30 tablet  6  . multivitamin (ONE-A-DAY MEN'S) TABS Take 1 tablet by mouth daily.      . nitroGLYCERIN (NITROSTAT) 0.4 MG SL tablet Place 1 tablet (0.4 mg total) under the tongue every 5 (five) minutes as needed for chest pain (up to 3 doses).      . pantoprazole (PROTONIX) 40 MG tablet Take 1 tablet (40 mg total) by mouth as needed.  30 tablet  6  . potassium chloride SA (KLOR-CON M20) 20 MEQ tablet Take 1 tablet (20 mEq total) by mouth daily.  30 tablet  6  . spironolactone (ALDACTONE) 25  MG tablet Take 25 mg by mouth daily.        Current medications: Current Facility-Administered Medications  Medication Dose Route Frequency Provider Last Rate Last Dose  . acetaminophen (TYLENOL) tablet 325-650 mg  325-650 mg Oral Q4H PRN Angelia Mould, MD       Or  . acetaminophen (TYLENOL) suppository 325-650 mg  325-650 mg Rectal Q4H PRN Angelia Mould, MD      . albuterol (PROVENTIL) (2.5 MG/3ML) 0.083% nebulizer solution 3 mL  3 mL Inhalation Q6H PRN Angelia Mould, MD      . allopurinol (ZYLOPRIM) tablet 100 mg  100 mg Oral Daily Angelia Mould, MD   100 mg at 06/25/14 1022  . alum & mag hydroxide-simeth (MAALOX/MYLANTA) 200-200-20 MG/5ML suspension 15-30 mL  15-30 mL Oral Q2H PRN Angelia Mould, MD   30 mL at 06/26/14 0259  . aspirin chewable tablet 81 mg  81 mg Oral Daily Angelia Mould, MD   81 mg at 06/25/14 1021  . clopidogrel (PLAVIX) tablet 75 mg  75 mg Oral Daily Angelia Mould, MD   75 mg at 06/25/14 1022  . colchicine tablet 0.6  mg  0.6 mg Oral Daily PRN Angelia Mould, MD      . dextrose 5 %-0.45 % sodium chloride infusion   Intravenous Continuous Angelia Mould, MD 75 mL/hr at 06/26/14 0700    . digoxin (LANOXIN) tablet 0.125 mg  0.125 mg Oral Daily Angelia Mould, MD   0.125 mg at 06/25/14 1022  . docusate sodium (COLACE) capsule 100 mg  100 mg Oral Daily Angelia Mould, MD   100 mg at 06/25/14 1021  . DOPamine (INTROPIN) 800 mg in dextrose 5 % 250 mL (3.2 mg/mL) infusion  0-20 mcg/kg/min Intravenous Continuous Ulyses Amor, PA-C 8.3 mL/hr at 06/26/14 0800 4.979 mcg/kg/min at 06/26/14 0800  . ezetimibe (ZETIA) tablet 10 mg  10 mg Oral Daily Angelia Mould, MD   10 mg at 06/25/14 1022  . furosemide (LASIX) tablet 60 mg  60 mg Oral BID Angelia Mould, MD   60 mg at 06/25/14 1652  . guaiFENesin-dextromethorphan (ROBITUSSIN DM) 100-10 MG/5ML syrup 15 mL  15 mL Oral Q4H PRN Angelia Mould, MD      . heparin ADULT infusion 100 units/mL (25000 units/250 mL)  1,600 Units/hr Intravenous Continuous Rogue Bussing, RPH 16 mL/hr at 06/26/14 0800 1,600 Units/hr at 06/26/14 0800  . hydrALAZINE (APRESOLINE) injection 10 mg  10 mg Intravenous Q2H PRN Angelia Mould, MD      . hydrALAZINE (APRESOLINE) tablet 75 mg  75 mg Oral TID Angelia Mould, MD   75 mg at 06/24/14 2205  . HYDROmorphone (DILAUDID) injection 1 mg  1 mg Intravenous Q1H PRN Angelia Mould, MD   1 mg at 06/25/14 0802  . indomethacin (INDOCIN) capsule 25 mg  25 mg Oral TID WC & HS Angelia Mould, MD   25 mg at 06/25/14 2054  . isosorbide dinitrate (DILATRATE-SR) capsule 40 mg  40 mg Oral TID Angelia Mould, MD   40 mg at 06/25/14 1652  . labetalol (NORMODYNE,TRANDATE) injection 10 mg  10 mg Intravenous Q2H PRN Angelia Mould, MD      . losartan (COZAAR) tablet 25 mg  25 mg Oral Daily Angelia Mould, MD   25 mg at 06/25/14 1022  . magnesium sulfate IVPB 2 g 50 mL  2 g  Intravenous Daily  PRN Angelia Mould, MD      . metoprolol (LOPRESSOR) injection 2-5 mg  2-5 mg Intravenous Q2H PRN Angelia Mould, MD      . metoprolol succinate (TOPROL-XL) 24 hr tablet 200 mg  200 mg Oral Daily Angelia Mould, MD   200 mg at 06/25/14 1022  . multivitamin with minerals tablet 1 tablet  1 tablet Oral Daily Angelia Mould, MD   1 tablet at 06/25/14 1022  . nitroGLYCERIN (NITROSTAT) SL tablet 0.4 mg  0.4 mg Sublingual Q5 min PRN Angelia Mould, MD      . omega-3 acid ethyl esters (LOVAZA) capsule 2 g  2 g Oral Daily Angelia Mould, MD   2 g at 06/25/14 1022  . ondansetron (ZOFRAN) injection 4 mg  4 mg Intravenous Q6H PRN Angelia Mould, MD   4 mg at 06/25/14 1622  . oxyCODONE-acetaminophen (PERCOCET/ROXICET) 5-325 MG per tablet 1-2 tablet  1-2 tablet Oral Q4H PRN Angelia Mould, MD   2 tablet at 06/25/14 2054  . pantoprazole (PROTONIX) EC tablet 40 mg  40 mg Oral Daily Angelia Mould, MD   40 mg at 06/25/14 1021  . phenol (CHLORASEPTIC) mouth spray 1 spray  1 spray Mouth/Throat PRN Angelia Mould, MD      . potassium chloride SA (K-DUR,KLOR-CON) CR tablet 20 mEq  20 mEq Oral Daily Angelia Mould, MD   20 mEq at 06/25/14 1022  . potassium chloride SA (K-DUR,KLOR-CON) CR tablet 20-40 mEq  20-40 mEq Oral Daily PRN Angelia Mould, MD      . spironolactone (ALDACTONE) tablet 25 mg  25 mg Oral Daily Angelia Mould, MD   25 mg at 06/25/14 1021     Allergies  Allergen Reactions  . Lisinopril Anaphylaxis and Swelling    angioedema  . Naproxen Hives     Past Medical History  Diagnosis Date  . CAD (coronary artery disease)     a. ant MI 1998 with PCI to LAD. b. Ant-lat MI 04/2012 tx with overlapping DES to LAD.  [name] Systolic CHF     NYHA Class II/III  . Ischemic cardiomyopathy     EF 25-30% by echo 04/2012; [place]  . CRI (chronic renal insufficiency)   . Unspecified essential  hypertension   . Impotence of organic origin   . Esophageal reflux   . Tobacco use disorder   . ICD (implantable cardiac defibrillator) in place   . Hypercholesteremia   . Heart murmur   . CVA (cerebral infarction) 11/2010  . Gout   . 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 Lung disease, interstitial     Past Surgical History  Procedure Laterality Date  . Anterior cervical decomp/discectomy fusion  2007    "put in 4 screws to hold my head on; ruptured C5; job related injury" (08/24/2012)  . Cardiac defibrillator placement  2011    [employer]  he is a [facility] R[facility] study patient  . Coronary angioplasty with stent placement  05/1997; 04/2012    "1 + 2; total of 3" (08/24/2012)    Family History  Problem Relation Age of Onset  . Diabetes Mother   . Heart disease Father     Social History:  reports that he quit smoking about 4 months ago. His smoking use included Cigarettes. He has a 20 pack-year smoking history. His smokeless tobacco use includes Snuff. He reports that he uses illicit drugs (Marijuana). He  reports that he does not drink alcohol.   Review of Systems: Constitutional:  denies fever, chills, diaphoresis, appetite change and fatigue.  HEENT: denies photophobia, eye pain, redness, hearing loss, ear pain, congestion, sore throat, rhinorrhea, sneezing, neck pain, neck stiffness and tinnitus.  Respiratory: denies SOB, DOE, cough, chest tightness, and wheezing.  Cardiovascular: denies chest pain, palpitations and leg swelling.  Gastrointestinal: denies nausea, vomiting, abdominal pain, diarrhea, constipation, blood in stool.  Genitourinary: denies dysuria, urgency, frequency, hematuria, flank pain and difficulty urinating.  Musculoskeletal: denies  myalgias, back pain, joint swelling, arthralgias and gait problem.   Skin: denies pallor, rash and wound.  Neurological: denies dizziness, seizures, syncope, weakness, light-headedness, numbness and headaches.   Hematological:  denies adenopathy, easy bruising, personal or family bleeding history.  Psychiatric/ Behavioral: denies suicidal ideation, mood changes, confusion, nervousness, sleep disturbance and agitation.    Physical Exam: BP 86/53  Pulse 58  Temp(Src) 97.8 F (36.6 C) (Oral)  Resp 18  Ht 5' 9.5" (1.765 m)  Wt 204 lb 2.3 oz (92.6 kg)  BMI 29.72 kg/m2  SpO2 100%  Wt Readings from Last 3 Encounters:  06/24/14 204 lb 2.3 oz (92.6 kg)  06/24/14 204 lb 2.3 oz (92.6 kg)  06/20/14 203 lb (92.08 kg)    General: Vital signs reviewed and noted. Well-developed, well-nourished, in no acute distress; alert,   Head: Normocephalic, atraumatic, sclera anicteric,   Neck: Supple. Negative for carotid bruits. No JVD   Lungs:  Clear bilaterally, no  wheezes, rales, or rhonchi. Breathing is normal   Heart: RRR with S1 S2. No murmurs, rubs, or gallops   Abdomen:  Soft, non-tender, non-distended with normoactive bowel sounds. No hepatomegaly. No rebound/guarding. No obvious abdominal masses   MSK: Strength and the appear normal for age.   Extremities: No clubbing or cyanosis. No edema.  Distal pedal pulses are 2+ and equal   Neurologic: Alert and oriented X 3. Moves all extremities spontaneously.  Psych: Responds to questions appropriately with a normal affect.     Lab results: Basic Metabolic Panel:  Recent Labs Lab 06/24/14 1010 06/25/14 0315  NA 141 134*  K 3.2* 3.6*  CL 103 100  CO2  --  21  GLUCOSE 117* 109*  BUN 12 13  CREATININE 1.60* 1.61*  CALCIUM  --  7.9*    Liver Function Tests: No results found for this basename: AST, ALT, ALKPHOS, BILITOT, PROT, ALBUMIN,  in the last 168 hours No results found for this basename: LIPASE, AMYLASE,  in the last 168 hours No results found for this basename: AMMONIA,  in the last 168 hours  CBC:  Recent Labs Lab 06/24/14 1000 06/24/14 1010 06/25/14 0300 06/26/14 0307  WBC 5.5  --  8.4 7.9  NEUTROABS 3.3  --   --   --   HGB 14.1 15.0 11.6*  10.3*  HCT 40.7 44.0 34.2* 30.7*  MCV 83.2  --  82.6 81.6  PLT 249  --  237 216    Cardiac Enzymes: No results found for this basename: CKTOTAL, CKMB, CKMBINDEX, TROPONINI,  in the last 168 hours  BNP: No components found with this basename: POCBNP,   CBG: No results found for this basename: GLUCAP,  in the last 168 hours  Coagulation Studies: No results found for this basename: LABPROT, INR,  in the last 72 hours   Other results:  EKG :    NSR at 73.  No ST or T wave changes  Imaging: Ct Head Wo Contrast  06/25/2014   CLINICAL DATA:  Acute onset left-sided weakness  EXAM: CT HEAD WITHOUT CONTRAST  TECHNIQUE: Contiguous axial images were obtained from the base of the skull through the vertex without intravenous contrast.  COMPARISON:  November 23, 2010  FINDINGS: The ventricles are normal in size and configuration. There is no mass, hemorrhage, extra-axial fluid collection, or midline shift. There is rather minimal periventricular small vessel disease in the centra semiovale bilaterally. Elsewhere gray-white compartments appear normal. There is no demonstrable acute infarct. The middle cerebral arteries do not show appreciable increased attenuation on either side. The bony calvarium appears intact. The mastoid air cells are clear. There is mild mucosal thickening in several ethmoid air cells bilaterally.  IMPRESSION: Mild mucosal thickening in several ethmoid air cells bilaterally. No intracranial mass, hemorrhage, or acute appearing infarct.   Electronically Signed   By: Lowella Grip M.D.   On: 06/25/2014 13:59   Dg Ang/ext/uni/or Right  06/25/2014   CLINICAL DATA:  Right lower extremity ischemia and right femoral embolectomy with patchy angioplasty of the right common femoral artery.  EXAM: RIGHT ANG/EXT/UNI/ OR  COMPARISON:  None.  FINDINGS: Intraoperative image demonstrates patent visualized popliteal artery and proximal aspects of the tibioperoneal trunk and anterior  tibial artery. There does appear to be spasm limiting outflow distally in the calf.  IMPRESSION: Probable spasm of tibial vessels below the knee. The visualized popliteal artery appears normally patent.   Electronically Signed   By: Aletta Edouard M.D.   On: 06/25/2014 14:54       Assessment & Plan:  1. Thromboembolus:   The patient has a hx of chronic systolic CHF and has akinesis of the apex by previous echo.  I suspect that he formed a thrombus in the apex which embolized.   There is no evidence of a fib.   I do agree with the need for lifelong anticoagulation given this recent event.   He is on heparin currently.  Will get pharmacy to dose coumadin.   2. Chronic systolic CHF:   Currently on metoprolol, losartan, aldactone, hydralazine, lasix  He is somewhat hypotensive today - improving with IV and dopamine. Can probably titrate the dopamine off soon.  3. CAD:  No angina  4.     Thayer Headings, Brooke Bonito., MD, [facility] 06/26/2014, 10:25 AM Office - [phone] Pager [phone]

## 2014-06-26 NOTE — Progress Notes (Signed)
PT Cancellation Note  Patient Details Name: LIEF PALMATIER MRN: [id number] DOB: 09/11/1951   Cancelled Treatment:    Reason Eval/Treat Not Completed: Medical issues which prohibited therapy, Per nsg, asked to hold OT/PT today.    Duncan Dull 06/26/2014, 1:35 PM Alben Deeds, [place] DPT  [phone]

## 2014-06-26 NOTE — Progress Notes (Signed)
Paged Dr. Scot Dock regarding pts BP still 80s/50s with dopamine drip going at 8.55mL/hr. Awaiting call back.

## 2014-06-26 NOTE — Progress Notes (Signed)
ANTICOAGULATION CONSULT NOTE - Follow Up Consult  Pharmacy Consult for Heparin Indication: RLE ischemia s/p embolectomy  Allergies  Allergen Reactions  . Lisinopril Anaphylaxis and Swelling    angioedema  . Naproxen Hives    Patient Measurements: Height: 5' 9.5" (176.5 cm) Weight: 204 lb 2.3 oz (92.6 kg) IBW/kg (Calculated) : 71.85 Heparin Dosing Weight: 91 kg  Vital Signs: Temp: 97.8 F (36.6 C) (10/12 0702) Temp Source: Oral (10/12 0702) BP: 86/53 mmHg (10/12 0840) Pulse Rate: 58 (10/12 0840)  Labs:  Recent Labs  06/24/14 1000 06/24/14 1010 06/25/14 0300 06/25/14 0315 06/25/14 1045 06/26/14 0306 06/26/14 0307  HGB 14.1 15.0 11.6*  --   --   --  10.3*  HCT 40.7 44.0 34.2*  --   --   --  30.7*  PLT 249  --  237  --   --   --  216  HEPARINUNFRC  --   --   --  0.12* 0.34 0.44  --   CREATININE  --  1.60*  --  1.61*  --   --   --     Estimated Creatinine Clearance: 53.3 ml/min (by C-G formula based on Cr of 1.61).   Medications:  Scheduled:  . allopurinol  100 mg Oral Daily  . aspirin  81 mg Oral Daily  . clopidogrel  75 mg Oral Daily  . digoxin  0.125 mg Oral Daily  . docusate sodium  100 mg Oral Daily  . ezetimibe  10 mg Oral Daily  . furosemide  60 mg Oral BID  . hydrALAZINE  75 mg Oral TID  . indomethacin  25 mg Oral TID WC & HS  . isosorbide dinitrate  40 mg Oral TID  . losartan  25 mg Oral Daily  . metoprolol succinate  200 mg Oral Daily  . multivitamin with minerals  1 tablet Oral Daily  . omega-3 acid ethyl esters  2 g Oral Daily  . pantoprazole  40 mg Oral Daily  . potassium chloride SA  20 mEq Oral Daily  . spironolactone  25 mg Oral Daily   Infusions:  . dextrose 5 % and 0.45% NaCl 75 mL/hr at 06/26/14 1038  . DOPamine 8 mcg/kg/min (06/26/14 1028)  . heparin 1,600 Units/hr (06/26/14 0800)   Assessment: Adam House is a 63 yo mail presented from [facility] with RLE ischemia. Heparin was started at [facility]. Pt s/p R  femoral embolectomy. Pharmacy resumed heparin post embolectomy.  Heparin level was therapeutic at 0.44 this AM.  H/H has dropped, likely d/t procedure.  Will continue to monitor for s/sx of bleeding.  Hgb 0.3, Plt wnl.    Goal of Therapy:  Heparin level 0.3-0.7 units/ml Monitor platelets by anticoagulation protocol: Yes   Plan:  1. Continue heparin gtt at 1600 units/hr 2. F/u heparin level in AM 3. Daily heparin level and CBC 4. Monitor closely for s/sx of bleeding  Albertina Parr, PharmD.  Clinical Pharmacist Pager [phone]

## 2014-06-26 NOTE — Progress Notes (Signed)
   VASCULAR SURGERY ASSESSMENT & PLAN:  * 2 Day Post-Op s/p: PROCEDURE:   1. Right femoral embolectomy   2. Bovine pericardial patch angioplasty of the right common femoral artery   3. Intraoperative arteriogram   4. 4 compartment fasciotomy   * Had an episode briefly yesterday of decreased consciousness and ? Left arm weakness. CT of head was unremarkable. Carotid Duplex ordered for today.   * Source of embolus is not clear. 2D-Echo ordered for today and will consult Cardiology for recommendations on anticoagulation. Also having issues with low BP and has CHF. Will need there help with this. The Echo will help here too.   * JP = 10 cc for 8 hours. D/C JP.   * PTx consult more stable medically.  * Continue heparin.    SUBJECTIVE: No complaints  PHYSICAL EXAM: Filed Vitals:   06/26/14 0005 06/26/14 0300 06/26/14 0400 06/26/14 0500  BP: 99/56 96/59 88/35  77/42  Pulse: 67 62 64 57  Temp: 98 F (36.7 C)   97.5 F (36.4 C)  TempSrc: Oral   Oral  Resp: 25 17 18 18   Height:      Weight:      SpO2: 100% 99% 95% 100%   Brisk DP signal with doppler Still with foot drop   LABS: Lab Results  Component Value Date   WBC 7.9 06/26/2014   HGB 10.3* 06/26/2014   HCT 30.7* 06/26/2014   MCV 81.6 06/26/2014   PLT 216 06/26/2014   Lab Results  Component Value Date   CREATININE 1.61* 06/25/2014   Lab Results  Component Value Date   INR 1.0 ratio 12/03/2009    Active Problems:   Ischemia of lower extremity   Gae Gallop Beeper: [phone] 06/26/2014

## 2014-06-26 NOTE — Progress Notes (Signed)
OT Cancellation Note  Patient Details Name: Adam House MRN: [id number] DOB: [birth year]   Cancelled Treatment:    Reason Eval/Treat Not Completed: Patient not medically ready Per nsg, asked to hold OT/PT today.  [place], OTR/L  [phone] 06/26/2014 06/26/2014, 11:03 AM

## 2014-06-26 NOTE — Progress Notes (Addendum)
VASCULAR LAB PRELIMINARY  PRELIMINARY  PRELIMINARY  PRELIMINARY  Carotid Dopplers completed.    Preliminary report:  There right ICA appears occluded.  The left distal CCA exhibits significant plaque with a 60-79% stenosis, highest end of scale, which is a significant change since study done 09/2013.  There is 40-59% left ICA stenosis.  Vertebral artery flow is antegrade.  The left vertebral artery exhibits increased velocities.   Rockelle Heuerman, RVT 06/26/2014, 9:29 AM

## 2014-06-27 DIAGNOSIS — I743 Embolism and thrombosis of arteries of the lower extremities: Secondary | ICD-10-CM

## 2014-06-27 DIAGNOSIS — I519 Heart disease, unspecified: Secondary | ICD-10-CM

## 2014-06-27 DIAGNOSIS — I359 Nonrheumatic aortic valve disorder, unspecified: Secondary | ICD-10-CM

## 2014-06-27 LAB — TYPE AND SCREEN
ABO/RH(D): A POS
Antibody Screen: NEGATIVE
Unit division: 0
Unit division: 0

## 2014-06-27 LAB — HEPARIN LEVEL (UNFRACTIONATED): Heparin Unfractionated: 0.31 IU/mL (ref 0.30–0.70)

## 2014-06-27 MED ORDER — WARFARIN SODIUM 7.5 MG PO TABS
7.5000 mg | ORAL_TABLET | Freq: Once | ORAL | Status: AC
  Administered 2014-06-27: 7.5 mg via ORAL
  Filled 2014-06-27 (×2): qty 1

## 2014-06-27 MED ORDER — WARFARIN - PHARMACIST DOSING INPATIENT
Freq: Every day | Status: DC
  Administered 2014-06-27 – 2014-06-30 (×2)

## 2014-06-27 MED ORDER — ISOSORBIDE DINITRATE ER 40 MG PO TBCR
40.0000 mg | EXTENDED_RELEASE_TABLET | Freq: Three times a day (TID) | ORAL | Status: DC
  Filled 2014-06-27 (×3): qty 1

## 2014-06-27 MED ORDER — PERFLUTREN LIPID MICROSPHERE
1.0000 mL | INTRAVENOUS | Status: AC | PRN
  Administered 2014-06-27: 2 mL via INTRAVENOUS
  Filled 2014-06-27: qty 10

## 2014-06-27 MED ORDER — ISOSORBIDE DINITRATE ER 40 MG PO CPCR
40.0000 mg | ORAL_CAPSULE | Freq: Three times a day (TID) | ORAL | Status: DC
  Administered 2014-06-27 – 2014-07-01 (×11): 40 mg via ORAL
  Filled 2014-06-27 (×15): qty 1

## 2014-06-27 NOTE — Progress Notes (Addendum)
Physical Therapy Treatment Patient Details Name: Adam House MRN: [id number] DOB: 1950-12-03 Today's Date: 06/27/2014    History of Present Illness  63 y.o. male developed the sudden onset of pain and paresthesias in his right foot at 4 AM this morning. He was evaluated at [facility] and sent to [facility] with an ischemic right lower extremity.  Pt is s/p Rt  femoral embolectomy with 4 compartment fasciotomy. PMH includes CAD, CHF, Ischemic cardiomyopathy, ICD, Gout.     PT Comments    Pt reports he ambulated in the hallway with "night RN" at 0430 and with OT recently. RN reports he has also been up in the room with nursing several times. He currently refused mobility training, however was agreeable to ROM exercises and education re: edema management (exercises, elevation above heart, walking with assist). Discussed home layout and he agrees he needs to practice stairs prior to d/c home. Will attempt 06/28/14.   Follow Up Recommendations  Home health PT;Supervision - Intermittent     Equipment Recommendations  None recommended by PT    Recommendations for Other Services       Precautions / Restrictions Precautions Precautions: Fall Restrictions Weight Bearing Restrictions: Yes RLE Weight Bearing: Weight bearing as tolerated    Mobility  Bed Mobility                  Transfers                 General transfer comment: pt refused as had been up numerous times this morning and his TV show was about to start  Ambulation/Gait             General Gait Details: pt refused as had been up numerous times this morning and his TV show was about to start   Stairs            Wheelchair Mobility    Modified Rankin (Stroke Patients Only)       Balance                                    Cognition Arousal/Alertness: Awake/alert (then became lethargic during session) Behavior During Therapy: WFL for tasks  assessed/performed Overall Cognitive Status: Within Functional Limits for tasks assessed                      Exercises General Exercises - Lower Extremity Ankle Circles/Pumps: AROM;Both;Supine;20 reps Quad Sets: AROM;Both;10 reps    General Comments        Pertinent Vitals/Pain Pain Assessment: 0-10 Pain Score: 4  Pain Location: Rt lower leg Pain Intervention(s): Limited activity within patient's tolerance;Monitored during session    [place] expects to be discharged to:: Private residence Living Arrangements: Spouse/significant other Available Help at Discharge:  (daughter is a Quarry manager at [employer]) Type of Home: House Home Access: Stairs to enter Entrance Stairs-Rails: Can reach both;Left;Right Home Layout: Multi-level;Bed/bath upstairs Home Equipment: Environmental consultant - 2 wheels;Cane - single point      Prior Function Level of Independence: Independent with assistive device(s)      Comments: users AD due to gout flare ups    PT Goals (current goals can now be found in the care plan section) Acute Rehab PT Goals Patient Stated Goal: to go home with wife and dog Progress towards PT goals: Not progressing toward goals - comment (refused all interventions except ex's and  edema education)    Frequency  Min 3X/week    PT Plan Discharge plan needs to be updated    Co-evaluation             End of Session   Activity Tolerance: Patient tolerated treatment well Patient left: with call bell/phone within reach;in chair;with family/visitor present     Time: 1213-1223 PT Time Calculation (min): 10 min  Charges:  $Therapeutic Exercise: 8-22 mins                    G Codes:      [name] 07-27-2014, 12:33 PM Pager [phone]

## 2014-06-27 NOTE — Clinical Documentation Improvement (Addendum)
'  Chronic renal insufficiency' documented in chart. Renal labs for this admission below.  Please document in your progress note and carry over to the discharge summary if chronic renal insuffiency can be further specified.    Component      BUN Creatinine  Latest Ref Rng      6 - 23 mg/dL 0.50 - 1.35 mg/dL  06/25/2014      13 1.61 (H)   Component      GFR calc Af Amer  Latest Ref Rng      >90 mL/min  06/25/2014      51 (L)   Possible Clinical Conditions: -Chronic Kidney Disease / Chronic Renal Failure (if present - please provide stage)   >Chronic kidney disease, stage 2 (mild) - GFR 60-89  >Chronic kidney disease, stage 3 (moderate) - GFR 30-59  >Chronic kidney disease, stage 4 (severe) - GFR 15-29 -Other -Unable to determine at present   Thank you, Mateo Flow, RN [phone] Clinical Documentation Specialist

## 2014-06-27 NOTE — Evaluation (Signed)
Occupational Therapy Evaluation and Discharge Patient Details Name: Adam House MRN: [id number] DOB: [birth year] Today's Date: 06/27/2014    History of Present Illness  63 y.o. male developed the sudden onset of pain and paresthesias in his right foot at 4 AM this morning. He was evaluated at [facility] and sent to [facility] with an ischemic right lower extremity.  Pt is s/p Rt  femoral embolectomy with 4 compartment fasciotomy. PMH includes CAD, CHF, Ischemic cardiomyopathy, ICD, Gout.    Clinical Impression   This 63 yo male admitted and underwent above presents to acute OT at an overall min A-set/S level and will have this at home as well as I believe he will progress to an even higher level before D/C home. Acute OT will sign off.    Follow Up Recommendations  No OT follow up    Equipment Recommendations  None recommended by OT       Precautions / Restrictions Precautions Precautions: Fall Restrictions Weight Bearing Restrictions: No RLE Weight Bearing: Weight bearing as tolerated      Mobility Bed Mobility Overal bed mobility: Modified Independent Bed Mobility: Supine to Sit     Supine to sit: Modified independent (Device/Increase time);HOB elevated        Transfers Overall transfer level: Needs assistance Equipment used: Rolling walker (2 wheeled) Transfers: Sit to/from Stand Sit to Stand: Min guard                   ADL Overall ADL's : Needs assistance/impaired Eating/Feeding: Independent;Sitting   Grooming: Set up;Sitting   Upper Body Bathing: Set up;Sitting   Lower Body Bathing: Min guard;Sit to/from stand   Upper Body Dressing : Set up;Sitting   Lower Body Dressing: Min guard;Sit to/from stand   Toilet Transfer: Min guard;Ambulation;RW (Bed>around 1/4 unit 3S>recliner)   Toileting- Clothing Manipulation and Hygiene: Min guard;Sit to/from stand        We also talked about energy conservation with getting dresses as  well as sitting to do tasks v. Standing. He was also educated on purse lipped breathing.                 Pertinent Vitals/Pain  Sats dropped into the "70's" with ambulation however not a good wave form and pt came back up into the low 90's with taking the pressure off of his RUE (where probe was) and purse lip breathing in less than 30 seconds each of the 3 times we stopped with the alarm. He most likely did drop into the 80's but not sure how low.     Hand Dominance Right   Extremity/Trunk Assessment Upper Extremity Assessment Upper Extremity Assessment: Overall WFL for tasks assessed           Communication Communication Communication: No difficulties   Cognition Arousal/Alertness: Awake/alert Behavior During Therapy: WFL for tasks assessed/performed Overall Cognitive Status: Within Functional Limits for tasks assessed                                Home Living Family/patient expects to be discharged to:: Private residence Living Arrangements: Spouse/significant other Available Help at Discharge: Family (daughter is a Quarry manager at [employer] (SNF)) Type of Home: House Home Access: Stairs to enter [employer] of Steps: 5-6 Entrance Stairs-Rails: Can reach both;Left;Right Home Layout: Multi-level;Bed/bath upstairs Alternate Level Stairs-Number of Steps: 6 Alternate Level Stairs-Rails: Can reach both;Left;Right Bathroom Shower/Tub: Tub/shower unit;Curtain Shower/tub characteristics: Curtain  Home Equipment: [place] - 2 wheels;Cane - single point;Shower seat          Prior Functioning/Environment Level of Independence: Independent with assistive device(s)        Comments: users AD due to gout flare ups     OT Diagnosis: Generalized weakness         OT Goals(Current goals can be found in the care plan section) Acute Rehab OT Goals Patient Stated Goal: to go home  OT Frequency:                End of Session Equipment Utilized  During Treatment: Gait belt;Rolling walker  Activity Tolerance:  (Had to stop x3 due to sats registering in 70's--came back up quickly with releasing pressure off of his RUE and purse lipped breathing.; do feel he probably was dropping into the 80's. Dtr and pt report that he has a lung issue that needs surgery) Patient left: in chair;with family/visitor present   Time: 1002-1032 OT Time Calculation (min): 30 min Charges:  OT General Charges $OT Visit: 1 Procedure OT Evaluation $Initial OT Evaluation Tier I: 1 Procedure OT Treatments $Self Care/Home Management : 23-37 mins  Almon Register [phone] 06/27/2014, 5:28 PM

## 2014-06-27 NOTE — Progress Notes (Signed)
Report called to Merkel, RN 2W. All belongings sent with patient, medication sent with patient. VSS. eICU and CCMT notified of transfer. Pt transferred by NT via wheelchair, on telemetry. Daughter aware of room change. RLE dressing CDI.

## 2014-06-27 NOTE — Progress Notes (Signed)
ANTICOAGULATION CONSULT NOTE - Follow Up Consult  Pharmacy Consult for Heparin, Coumadin  Indication: RLE ischemia s/p embolectomy  Allergies  Allergen Reactions  . Lisinopril Anaphylaxis and Swelling    angioedema  . Naproxen Hives    Patient Measurements: Height: 5' 9.5" (176.5 cm) Weight: 204 lb 2.3 oz (92.6 kg) IBW/kg (Calculated) : 71.85 Heparin Dosing Weight: 91 kg  Vital Signs: Temp: 97.6 F (36.4 C) (10/13 1128) Temp Source: Oral (10/13 1128) BP: 109/64 mmHg (10/13 1300) Pulse Rate: 60 (10/13 1300)  Labs:  Recent Labs  06/25/14 0300  06/25/14 0315 06/25/14 1045 06/26/14 0306 06/26/14 0307 06/27/14 1211  HGB 11.6*  --   --   --   --  10.3*  --   HCT 34.2*  --   --   --   --  30.7*  --   PLT 237  --   --   --   --  216  --   HEPARINUNFRC  --   < > 0.12* 0.34 0.44  --  0.31  CREATININE  --   --  1.61*  --   --   --   --   < > = values in this interval not displayed.  Estimated Creatinine Clearance: 53.3 ml/min (by C-G formula based on Cr of 1.61).   Medications:  Scheduled:  . allopurinol  100 mg Oral Daily  . aspirin  81 mg Oral Daily  . clopidogrel  75 mg Oral Daily  . digoxin  0.125 mg Oral Daily  . docusate sodium  100 mg Oral Daily  . ezetimibe  10 mg Oral Daily  . isosorbide dinitrate  40 mg Oral TID  . losartan  25 mg Oral Daily  . multivitamin with minerals  1 tablet Oral Daily  . omega-3 acid ethyl esters  2 g Oral Daily  . pantoprazole  40 mg Oral Daily  . potassium chloride SA  20 mEq Oral Daily  . spironolactone  25 mg Oral Daily  . warfarin  7.5 mg Oral ONCE-1800  . Warfarin - Pharmacist Dosing Inpatient   Does not apply q1800   Infusions:  . dextrose 5 % and 0.45% NaCl 75 mL/hr at 06/26/14 2300  . DOPamine Stopped (06/27/14 1129)  . heparin 1,600 Units/hr (06/27/14 1000)   Assessment: Adam House is a 63 yo mail presented from [place] with RLE ischemia. Heparin was started at [facility]. Pt s/p R femoral  embolectomy. Pharmacy resumed heparin post embolectomy.  Heparin level remains therapeutic today at 0.31.  H/H has dropped, likely d/t procedure.  Now transitioning to Coumadin. Will continue to monitor for s/sx of bleeding.  Hgb 10.3, Plt wnl.    Goal of Therapy:  Heparin level 0.3-0.7 units/ml Monitor platelets by anticoagulation protocol: Yes   Plan:  1. Continue heparin gtt at 1600 units/hr till INR > 2 2. Coumadin 7.5 mg x 1 dose today  3. Daily PT/INR, heparin level, and CBC 4. Monitor closely for s/sx of bleeding  Albertina Parr, PharmD.  Clinical Pharmacist Pager [phone]

## 2014-06-27 NOTE — Progress Notes (Addendum)
Progress Note  VASCULAR SURGERY ASSESSMENT & PLAN:  Agree with note below  * Now off of dopamine. Transfer to 2W (telemetry)  *Physical Therapy is working with the patient now.   * The Left CCA stenosis is asymptomatic, therefore I would not recommend CT angio or cerebral arteriogram unless I was considering L CEA. This will need to be followed closely and I will arrange a f/u duplex in 6 months. In addition, the velocities may be falsely elevated because of the contralateral occlusion. The patient has been followed by Dr. Kellie Simmering for his carotid disease. He will need f/u with Dr. Kellie Simmering in 6 months.   * Right LE embolus. Now on heparin and Coumadin per pharmacy. Echo done. Results pending.  * Right lateral fasciotomy site. Could possibly close this later in the week depending upon the amount of swelling.   Deitra Mayo, MD, FACS Beeper [phone] 12:30 PM  06/27/2014 7:20 AM 3 Days Post-Op  Subjective:  No complaints  Afebrile 96'E-952'W systolic HR 41'L-24'M regualr 89% 3LO2NC (Came up to 91 while I was in the room)  Gtts:  dopamine 72mcg/kg/min (down frrom 34mcg) Heparin   Filed Vitals:   06/27/14 0700  BP: 89/56  Pulse: 60  Temp:   Resp: 17   Physical Exam: Cardiac:  regular Lungs:  CTAB Incisions:  Right leg lateral fasciotomy is with beefy red tissue; medial incision is closed with staples in tact.   Extremities:  3+ right DP; sensory and motor are in tact; compartments are soft.  CBC    Component Value Date/Time   WBC 7.9 06/26/2014 0307   RBC 3.76* 06/26/2014 0307   HGB 10.3* 06/26/2014 0307   HCT 30.7* 06/26/2014 0307   PLT 216 06/26/2014 0307   MCV 81.6 06/26/2014 0307   MCH 27.4 06/26/2014 0307   MCHC 33.6 06/26/2014 0307   RDW 16.9* 06/26/2014 0307   LYMPHSABS 0.9 06/24/2014 1000   MONOABS 1.1* 06/24/2014 1000   EOSABS 0.2 06/24/2014 1000   BASOSABS 0.0 06/24/2014 1000   BMET    Component Value Date/Time   NA 134* 06/25/2014 0315   K 3.6* 06/25/2014 0315   CL 100 06/25/2014 0315   CO2 21 06/25/2014 0315   GLUCOSE 109* 06/25/2014 0315   BUN 13 06/25/2014 0315   CREATININE 1.61* 06/25/2014 0315   CREATININE 1.34 07/21/2013 1212   CALCIUM 7.9* 06/25/2014 0315   GFRNONAA 44* 06/25/2014 0315   GFRAA 51* 06/25/2014 0315   INR    Component Value Date/Time   INR 1.0 ratio 12/03/2009 1230    Intake/Output Summary (Last 24 hours) at 06/27/14 0720 Last data filed at 06/27/14 0600  Gross per 24 hour  Intake 2441.42 ml  Output   1925 ml  Net 516.42 ml   Carotid duplex 06/26/14: -Right: moderate to severe soft plaque CCA. Right ICA appears occluded.  -The left distal CCA exhibits significant plaque with a 60-79% stenosis, highest end of scale, which is a significant change since study done 09/2013. -There is 40-59% left ICA stenosis. Vertebral artery flow is antegrade. The left vertebral artery exhibits increased velocities.  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  Thus less common causes such as vasculitis should  be excluded.  Echo results are pending.  Assessment:  63 y.o. male is s/p:  1. Right femoral embolectomy  2. Bovine pericardial patch angioplasty of the right common femoral artery  3. Intraoperative arteriogram  4. 4 compartment fasciotomy  3 Days Post-Op  Plan: -pt with right palpable DP--sensory & motor are in tact. -right lateral fasciotomy site is with beefy red healthy tissue -left arm weakness and decreased LOC - carotid duplex reveals left distal CCA with significant plaque with a 60-79% stenosis at the highest end of the scale, which is a significant  change since study 10 months ago in January.  Will d/w Dr. Scot Dock. -DVT prophylaxis:  Heparin -cardiology recommends lifelong anticoagulation with coumadin.  Pt is also on Plavix & Asa 81mg .   -wean dopamine as tolerated. -increase mobilization-he did walk in the unit this am.  Leontine Locket, PA-C [facility] [phone] 06/27/2014 7:20 AM

## 2014-06-27 NOTE — Progress Notes (Signed)
    Subjective:  Feels well. At baseline, minimal dyspnea. No pain.   Objective:  Vital Signs in the last 24 hours: Temp:  [96.1 F (35.6 C)-98.5 F (36.9 C)] 97.6 F (36.4 C) (10/13 0700) Pulse Rate:  [56-80] 60 (10/13 0700) Resp:  [8-24] 17 (10/13 0700) BP: (80-123)/(35-74) 89/56 mmHg (10/13 0700) SpO2:  [88 %-100 %] 89 % (10/13 0700)  Intake/Output from previous day: 10/12 0701 - 10/13 0700 In: 2465.7 [I.V.:2465.7] Out: 1925 [Urine:1925]   Physical Exam: General: Well developed, well nourished, in no acute distress. Head:  Normocephalic and atraumatic. Minimal JVD Lungs: Minimal crackles at bases. Heart: Normal S1 and S2.  No murmur, rubs or gallops.  Abdomen: soft, non-tender, positive bowel sounds. Extremities: No clubbing or cyanosis. No edema. Right leg surgical scar noted.  Neurologic: Alert and oriented x 3.    Lab Results:  Recent Labs  06/25/14 0300 06/26/14 0307  WBC 8.4 7.9  HGB 11.6* 10.3*  PLT 237 216    Recent Labs  06/24/14 1010 06/25/14 0315  NA 141 134*  K 3.2* 3.6*  CL 103 100  CO2  --  21  GLUCOSE 117* 109*  BUN 12 13  CREATININE 1.60* 1.61*   Telemetry: NSR Personally viewed.    Cardiac Studies:  EF 25%, new ECHO pending. Apical akinesis previously.  Assessment/Plan:   63 year old with chronic systolic heart failure EF 25% with apical akinesis with suspected cardioembolic source from apex to lower extremity post thrombus extraction.   1) Arterial thrombus  - suspected cardiac source, has apical akinesis, nidus for development of thrombus.   - Agree with Dr. Acie Fredrickson that we should treat with coumadin from this point on.   - I wrote order for coumadin per pharmacy. On heparin IV.  - Once therapeutic (INR 2-3) would stop ASA and continue Plavix and Coumadin.   - Await ECHO.   2) Chronic systolic heart failure  - Weaning off Dopamine IV (currently 6)  - Holding Toprol XL 200, Hydralazine 75 TID, Lasix 60 BID.   - I will slowly  add back these medications as BP allows.   - BP in outpatient setting 130's SBP.  - No signs currently of fluid overload (minimal basilar crackles). Continue spironolactone. If respiratory rate increases or dyspnea worsens (states he is at baseline) will add back lasix sooner rather than later).  - Continue Imdur, Dig, Spironolactone.   - Stopped Indomethacin (NSAID can exacerbate CHF)  3) CAD  - Stable.   4) Chronic anticoagulation  - As above  5) Hypotension  - Weaning off Dopamine. Will tolerate SBP in the 90's.   6) Gout  - Stable, allopurinol.   - Stopped chronic indocin.    Will follow. Complex situation.    Adam House, [place] 06/27/2014, 8:36 AM

## 2014-06-27 NOTE — Progress Notes (Signed)
Echocardiogram 2D Echocardiogram with Definity has been performed.  Adam House 06/27/2014, 10:47 AM

## 2014-06-28 ENCOUNTER — Telehealth: Payer: Self-pay | Admitting: Vascular Surgery

## 2014-06-28 ENCOUNTER — Telehealth: Payer: Self-pay

## 2014-06-28 LAB — CBC
HCT: 29.7 % — ABNORMAL LOW (ref 39.0–52.0)
HEMOGLOBIN: 10 g/dL — AB (ref 13.0–17.0)
MCH: 27.7 pg (ref 26.0–34.0)
MCHC: 33.7 g/dL (ref 30.0–36.0)
MCV: 82.3 fL (ref 78.0–100.0)
Platelets: 236 10*3/uL (ref 150–400)
RBC: 3.61 MIL/uL — ABNORMAL LOW (ref 4.22–5.81)
RDW: 16.9 % — ABNORMAL HIGH (ref 11.5–15.5)
WBC: 6.4 10*3/uL (ref 4.0–10.5)

## 2014-06-28 LAB — HEPARIN LEVEL (UNFRACTIONATED)
HEPARIN UNFRACTIONATED: 0.45 [IU]/mL (ref 0.30–0.70)
Heparin Unfractionated: 0.18 IU/mL — ABNORMAL LOW (ref 0.30–0.70)
Heparin Unfractionated: 0.22 IU/mL — ABNORMAL LOW (ref 0.30–0.70)

## 2014-06-28 LAB — PROTIME-INR
INR: 1.3 (ref 0.00–1.49)
Prothrombin Time: 16.2 seconds — ABNORMAL HIGH (ref 11.6–15.2)

## 2014-06-28 MED ORDER — WARFARIN VIDEO
Freq: Once | Status: AC
  Administered 2014-06-28: 18:00:00

## 2014-06-28 MED ORDER — METOPROLOL SUCCINATE ER 25 MG PO TB24
25.0000 mg | ORAL_TABLET | Freq: Every day | ORAL | Status: DC
  Administered 2014-06-28: 25 mg via ORAL
  Filled 2014-06-28 (×2): qty 1

## 2014-06-28 MED ORDER — DEXTROSE 5 % IV SOLN
1.5000 g | INTRAVENOUS | Status: DC
  Filled 2014-06-28: qty 1.5

## 2014-06-28 MED ORDER — CEFAZOLIN SODIUM-DEXTROSE 2-3 GM-% IV SOLR
2.0000 g | INTRAVENOUS | Status: DC
  Filled 2014-06-28: qty 50

## 2014-06-28 MED ORDER — WARFARIN SODIUM 5 MG PO TABS
5.0000 mg | ORAL_TABLET | Freq: Once | ORAL | Status: AC
  Administered 2014-06-28: 5 mg via ORAL
  Filled 2014-06-28 (×2): qty 1

## 2014-06-28 MED ORDER — METOPROLOL SUCCINATE ER 25 MG PO TB24
25.0000 mg | ORAL_TABLET | Freq: Once | ORAL | Status: AC
  Administered 2014-06-28: 25 mg via ORAL

## 2014-06-28 MED ORDER — FUROSEMIDE 40 MG PO TABS
60.0000 mg | ORAL_TABLET | Freq: Two times a day (BID) | ORAL | Status: DC
  Administered 2014-06-28 – 2014-07-01 (×6): 60 mg via ORAL
  Filled 2014-06-28 (×8): qty 1

## 2014-06-28 MED ORDER — CEFAZOLIN SODIUM 1-5 GM-% IV SOLN: 1.0000 g | INTRAVENOUS | Status: DC

## 2014-06-28 MED ORDER — PATIENT'S GUIDE TO USING COUMADIN BOOK
Freq: Once | Status: AC
  Administered 2014-06-28: 18:00:00
  Filled 2014-06-28 (×2): qty 1

## 2014-06-28 MED ORDER — METOPROLOL SUCCINATE ER 50 MG PO TB24
50.0000 mg | ORAL_TABLET | Freq: Every day | ORAL | Status: DC
  Administered 2014-06-29: 50 mg via ORAL
  Filled 2014-06-28 (×2): qty 1

## 2014-06-28 NOTE — Progress Notes (Signed)
Run of vtach, pt states he doses not feel dizzy or weak but rather feels normal, 99% 2L [place], 133/62 pulse of 83, will continue to monitor closely Rickard Rhymes, RN

## 2014-06-28 NOTE — Progress Notes (Signed)
ANTICOAGULATION CONSULT NOTE - Follow Up Consult  Pharmacy Consult for Heparin Indication: arterial embolism right leg; s/p embolectomy on 06/24/14  Allergies  Allergen Reactions  . Lisinopril Anaphylaxis and Swelling    angioedema  . Naproxen Hives    Patient Measurements: Height: 5' 9.5" (176.5 cm) Weight: 204 lb 2.3 oz (92.6 kg) IBW/kg (Calculated) : 71.85 Heparin Dosing Weight: 92 kg  Vital Signs: Temp: 97.4 F (36.3 C) (10/14 1426) Temp Source: Oral (10/14 1426) BP: 141/56 mmHg (10/14 1426) Pulse Rate: 84 (10/14 1426)  Labs:  Recent Labs  06/26/14 0307  06/28/14 0500 06/28/14 1230 06/28/14 2105  HGB 10.3*  --  10.0*  --   --   HCT 30.7*  --  29.7*  --   --   PLT 216  --  236  --   --   LABPROT  --   --  16.2*  --   --   INR  --   --  1.30  --   --   HEPARINUNFRC  --   < > 0.18* 0.22* 0.45  < > = values in this interval not displayed.  Estimated Creatinine Clearance: 53.3 ml/min (by C-G formula based on Cr of 1.61).  [name] dextrose 5 % and 0.45% NaCl 75 mL/hr at 06/26/14 2300  . DOPamine Stopped (06/27/14 1129)  . heparin 2,150 Units/hr (06/28/14 1504)     Assessment:   POD# 4 right femoral embolectomy.   Heparin level as low early this morning (0.18) and rate was increased from 1600 to 1900 units/hr.  Follow-up level is still low at 0.22.      INR 1.30 after Coumadin 7.5 mg x 1 last night.  For possible closure of fasciotomy tomorrow.  Discussed briefly with Dr. Scot Dock.  Coumadin to continue today, but will reduce dose to try to avoid significantly increased INR by am.   Also on Aspirin 81 mg and Plavix 75 mg daily. Per Cardiology, Aspirin to stop when INR >2.   Plavix to continue.  PM f/u: heparin level at goal on 2150 units/hr.  No bleeding or complications noted.  Goal of Therapy:  INR 2-3 Heparin level 0.3-0.7 units/ml Monitor platelets by anticoagulation protocol: Yes   Plan:  Continue heparin at current rate. F/u AM heparin level and  CBC.  Uvaldo Rising, BCPS  Clinical Pharmacist Pager [phone]  06/28/2014 9:51 PM

## 2014-06-28 NOTE — Progress Notes (Addendum)
ANTICOAGULATION CONSULT NOTE - Follow Up Consult  Pharmacy Consult for Heparin and Coumadin Indication: arterial embolism right leg; s/p embolectomy on 06/24/14  Allergies  Allergen Reactions  . Lisinopril Anaphylaxis and Swelling    angioedema  . Naproxen Hives    Patient Measurements: Height: 5' 9.5" (176.5 cm) Weight: 204 lb 2.3 oz (92.6 kg) IBW/kg (Calculated) : 71.85 Heparin Dosing Weight: 92 kg  Vital Signs: Temp: 97.4 F (36.3 C) (10/14 1426) Temp Source: Oral (10/14 1426) BP: 141/56 mmHg (10/14 1426) Pulse Rate: 84 (10/14 1426)  Labs:  Recent Labs  06/26/14 0306 06/26/14 0307 06/27/14 1211 06/28/14 0500  HGB  --  10.3*  --  10.0*  HCT  --  30.7*  --  29.7*  PLT  --  216  --  236  LABPROT  --   --   --  16.2*  INR  --   --   --  1.30  HEPARINUNFRC 0.44  --  0.31 0.18*    Estimated Creatinine Clearance: 53.3 ml/min (by C-G formula based on Cr of 1.61).  Assessment:   POD# 4 right femoral embolectomy.   Heparin level as low early this morning (0.18) and rate was increased from 1600 to 1900 units/hr.  Follow-up level is still low at 0.22.      INR 1.30 after Coumadin 7.5 mg x 1 last night.  For possible closure of fasciotomy tomorrow.  Discussed briefly with Dr. Scot Dock.  Coumadin to continue today, but will reduce dose to try to avoid significantly increased INR by am.   Also on Aspirin 81 mg and Plavix 75 mg daily. Per Cardiology, Aspirin to stop when INR >2.   Plavix to continue.  Goal of Therapy:  INR 2-3 Heparin level 0.3-0.7 units/ml Monitor platelets by anticoagulation protocol: Yes   Plan:   Increase Heparin to 2150 units/hr  Coumadin 5 mg x 1 today.  Continue daily heparin level, PT/INR and CBC.  Stop Aspirin when INR >2.  Continue Plavix 75 mg daily.  Discussed Coumadin use, precautions, monitoring and potential drug-drug and drug-food interactions.  Arty Baumgartner, [place] Pager: [phone] 06/28/2014,2:30 PM

## 2014-06-28 NOTE — Progress Notes (Signed)
   VASCULAR SURGERY ASSESSMENT & PLAN:  * 4 Days Post-Op s/p: right femoral embolectomy and 4 compartment fasciotomy  * ECHO: Global hypokinesis with apical akinesis; overall severely reduced LV function; mild LVE and LVH; restrictive filling; biatrial enlargement; mildly reduced RV function; mild AI.  *  Stage 3 CKD: stable  * PTx:  * Heparin and Coumadin per pharmacy.   * I will try to close the fasciotomy tomorrow if swelling is better.  SUBJECTIVE: No specific complaints.  PHYSICAL EXAM: Filed Vitals:   06/27/14 1514 06/27/14 2000 06/27/14 2142 06/28/14 0613  BP: 123/64 104/56 109/62 106/58  Pulse: 71 76 74 86  Temp: 97.6 F (36.4 C) 97.4 F (36.3 C)  97.9 F (36.6 C)  TempSrc: Oral Oral  Oral  Resp: 18 18  18   Height:      Weight:      SpO2: 95% 92%  96%   Right foot warm Incision looks fine Dressing on fasciotomy site with minimal drainage.   LABS: Lab Results  Component Value Date   WBC 6.4 06/28/2014   HGB 10.0* 06/28/2014   HCT 29.7* 06/28/2014   MCV 82.3 06/28/2014   PLT 236 06/28/2014   Lab Results  Component Value Date   CREATININE 1.61* 06/25/2014   Lab Results  Component Value Date   INR 1.30 06/28/2014   Active Problems:   Ischemia of lower extremity   Gae Gallop Beeper: [phone] 06/28/2014

## 2014-06-28 NOTE — Discharge Instructions (Addendum)
Information on my medicine - Coumadin   (Warfarin)  This medication education was reviewed with me or my healthcare representative as part of my discharge preparation.  The pharmacist that spoke with me during my hospital stay was:  Arty Baumgartner, [facility]  Why was Coumadin prescribed for you? Coumadin was prescribed for you because you have a blood clot or a medical condition that can cause an increased risk of forming blood clots. Blood clots can cause serious health problems by blocking the flow of blood to the heart, lung, or brain. Coumadin can prevent harmful blood clots from forming. As a reminder your indication for Coumadin is:   Select from menu   blood clot in artery of right leg.  What test will check on my response to Coumadin? While on Coumadin (warfarin) you will need to have an INR test regularly to ensure that your dose is keeping you in the desired range. The INR (international normalized ratio) number is calculated from the result of the laboratory test called prothrombin time (PT).  If an INR APPOINTMENT HAS NOT ALREADY BEEN MADE FOR YOU please schedule an appointment to have this lab work done by your health care provider within 7 days. Your INR goal is usually a number between:  2 to 3 or your provider may give you a more narrow range like 2-2.5.  Ask your health care provider during an office visit what your goal INR is.  What  do you need to  know  About  COUMADIN? Take Coumadin (warfarin) exactly as prescribed by your healthcare provider about the same time each day.  DO NOT stop taking without talking to the doctor who prescribed the medication.  Stopping without other blood clot prevention medication to take the place of Coumadin may increase your risk of developing a new clot or stroke.  Get refills before you run out.  What do you do if you miss a dose? If you miss a dose, take it as soon as you remember on the same day then continue your regularly scheduled regimen  the next day.  Do not take two doses of Coumadin at the same time.  Important Safety Information A possible side effect of Coumadin (Warfarin) is an increased risk of bleeding. You should call your healthcare provider right away if you experience any of the following:   Bleeding from an injury or your nose that does not stop.   Unusual colored urine (red or dark brown) or unusual colored stools (red or black).   Unusual bruising for unknown reasons.   A serious fall or if you hit your head (even if there is no bleeding).  Some foods or medicines interact with Coumadin (warfarin) and might alter your response to warfarin. To help avoid this:   Eat a balanced diet, maintaining a consistent amount of Vitamin K.   Notify your provider about major diet changes you plan to make.   Avoid alcohol or limit your intake to 1 drink for women and 2 drinks for men per day. (1 drink is 5 oz. wine, 12 oz. beer, or 1.5 oz. liquor.)  Make sure that ANY health care provider who prescribes medication for you knows that you are taking Coumadin (warfarin).  Also make sure the healthcare provider who is monitoring your Coumadin knows when you have started a new medication including herbals and non-prescription products.  Coumadin (Warfarin)  Major Drug Interactions  Increased Warfarin Effect Decreased Warfarin Effect  Alcohol (large quantities) Antibiotics (esp.  Septra/Bactrim, Flagyl, Cipro) Amiodarone (Cordarone) Aspirin (ASA) Cimetidine (Tagamet) Megestrol (Megace) NSAIDs (ibuprofen, naproxen, etc.) Piroxicam (Feldene) Propafenone (Rythmol SR) Propranolol (Inderal) Isoniazid (INH) Posaconazole (Noxafil) Barbiturates (Phenobarbital) Carbamazepine (Tegretol) Chlordiazepoxide (Librium) Cholestyramine (Questran) Griseofulvin Oral Contraceptives Rifampin Sucralfate (Carafate) Vitamin K   Coumadin (Warfarin) Major Herbal Interactions  Increased Warfarin Effect Decreased Warfarin Effect   Garlic Ginseng Ginkgo biloba Coenzyme Q10 Green tea St. Johns wort    Coumadin (Warfarin) FOOD Interactions  Eat a consistent number of servings per week of foods HIGH in Vitamin K (1 serving =  cup)  Collards (cooked, or boiled & drained) Kale (cooked, or boiled & drained) Mustard greens (cooked, or boiled & drained) Parsley *serving size only =  cup Spinach (cooked, or boiled & drained) Swiss chard (cooked, or boiled & drained) Turnip greens (cooked, or boiled & drained)  Eat a consistent number of servings per week of foods MEDIUM-HIGH in Vitamin K (1 serving = 1 cup)  Asparagus (cooked, or boiled & drained) Broccoli (cooked, boiled & drained, or raw & chopped) Brussel sprouts (cooked, or boiled & drained) *serving size only =  cup Lettuce, raw (green leaf, endive, romaine) Spinach, raw Turnip greens, raw & chopped   These websites have more information on Coumadin (warfarin):  FailFactory.se; VeganReport.com.au;

## 2014-06-28 NOTE — Telephone Encounter (Addendum)
Message copied by Gena Fray on Wed Jun 28, 2014 11:00 AM ------      Message from: Denman George      Created: Tue Jun 27, 2014  1:32 PM      Regarding: change appt. time frame                   ----- Message -----         From: Angelia Mould, MD         Sent: 06/27/2014  12:40 PM           To: [facility] Charge Pool      Subject: f/u                                                      This patient has been followed by Dr. Kellie Simmering with carotid disease. He already has a f/u duplex and office visit scheduled. Since he just has a duplex in the hospital, his next duplex and office visit can be changed to 6 months. Thanks.       CD ------  06/28/14: left msg for patient re appt, dpm

## 2014-06-28 NOTE — Progress Notes (Signed)
Physical Therapy Treatment Patient Details Name: CAYMAN KIELBASA MRN: [id number] DOB: 06-19-1951 Today's Date: 06/28/2014    History of Present Illness  Pt is s/p Rt  femoral embolectomy with 4 compartment fasciotomy. PMH includes CAD, CHF, Ischemic cardiomyopathy, ICD, Gout.     PT Comments    Pt moving well and very agreeable today.  Noted plan for return to OR to close fasciotomy tomorrow, so will check back on pt Friday to ensure continued mobility.    Follow Up Recommendations  Home health PT;Supervision - Intermittent     Equipment Recommendations  None recommended by PT    Recommendations for Other Services       Precautions / Restrictions Precautions Precautions: Fall Restrictions Weight Bearing Restrictions: Yes RLE Weight Bearing: Weight bearing as tolerated    Mobility  Bed Mobility Overal bed mobility: Modified Independent                Transfers Overall transfer level: Needs assistance Equipment used: Rolling walker (2 wheeled) Transfers: Sit to/from Stand Sit to Stand: Modified independent (Device/Increase time)         General transfer comment: pt utilizes UE support, but demos good technique.    Ambulation/Gait Ambulation/Gait assistance: Min guard Ambulation Distance (Feet): 200 Feet Assistive device: None Gait Pattern/deviations: Step-through pattern;Decreased step length - left;Decreased stance time - right;Antalgic     General Gait Details: pt moving well, but antalgically 2/2 guarding R LE.     Stairs            Wheelchair Mobility    Modified Rankin (Stroke Patients Only)       Balance Overall balance assessment: Needs assistance Sitting-balance support: No upper extremity supported;Feet supported Sitting balance-Leahy Scale: Good     Standing balance support: No upper extremity supported Standing balance-Leahy Scale: Fair                      Cognition Arousal/Alertness: Awake/alert Behavior  During Therapy: WFL for tasks assessed/performed Overall Cognitive Status: Within Functional Limits for tasks assessed                      Exercises      General Comments        Pertinent Vitals/Pain Pain Assessment: 0-10 Pain Score: 2  Pain Location: R LE Pain Descriptors / Indicators: Guarding Pain Intervention(s): Premedicated before session;Repositioned    Home Living                      Prior Function            PT Goals (current goals can now be found in the care plan section) Acute Rehab PT Goals Patient Stated Goal: to go home PT Goal Formulation: With patient Time For Goal Achievement: 07/02/14 Potential to Achieve Goals: Good Progress towards PT goals: Progressing toward goals    Frequency  Min 3X/week    PT Plan Current plan remains appropriate    Co-evaluation             End of Session   Activity Tolerance: Patient tolerated treatment well Patient left: in bed;with call bell/phone within reach     Time: 5170-0174 PT Time Calculation (min): 16 min  Charges:  $Gait Training: 8-22 mins                    G CodesCatarina Hartshorn, [place] 06/28/2014, 10:14 AM

## 2014-06-28 NOTE — Progress Notes (Signed)
ANTICOAGULATION CONSULT NOTE - Follow Up Consult  Pharmacy Consult for heparin Indication: RLE ischemia s/p embolectomy  Labs:  Recent Labs  06/26/14 0306 06/26/14 0307 06/27/14 1211 06/28/14 0500  HGB  --  10.3*  --  10.0*  HCT  --  30.7*  --  29.7*  PLT  --  216  --  236  LABPROT  --   --   --  16.2*  INR  --   --   --  1.30  HEPARINUNFRC 0.44  --  0.31 0.18*    Assessment: 63yo male now subtherapeutic on heparin after three levels at goal though had been trending down and was at low end of goal yesterday; no gtt issues per RN.  Goal of Therapy:  Heparin level 0.3-0.7 units/ml  Plan:  Will increase heparin gtt by 3 units/kg/hr to 1900 units/hr and check level in 6hr.  Wynona Neat, PharmD, BCPS  06/28/2014,5:51 AM

## 2014-06-28 NOTE — Progress Notes (Signed)
      Patient had two 50 beat runs of NSVT. He was not shocked. Will give him an extra dose of Toprol XL 25 mg tonight and increase daily dose to 50mg  starting tomorrow. Continue to monitor. Consider interrogating device in the AM   Angelena Form PA-C  [facility]

## 2014-06-28 NOTE — Progress Notes (Signed)
Vtach run, pt complains of SOB, no ICD firing, BP 120/63, pulse 82, 2L [place], Lorretta Harp PA notified, no orders received, will continue to monitor pt closely Rickard Rhymes, RN

## 2014-06-28 NOTE — Progress Notes (Signed)
    Subjective:  Feels better. At baseline, mild increased dyspnea (heart as well as ILD). No pain.   Objective:  Vital Signs in the last 24 hours: Temp:  [97.4 F (36.3 C)-97.9 F (36.6 C)] 97.9 F (36.6 C) (10/14 0825) Pulse Rate:  [60-86] 79 (10/14 0825) Resp:  [14-20] 20 (10/14 0825) BP: (100-124)/(56-70) 124/70 mmHg (10/14 0825) SpO2:  [92 %-96 %] 96 % (10/14 0613)  Intake/Output from previous day: 10/13 0701 - 10/14 0700 In: 77.1 [I.V.:77.1] Out: 2900 [Urine:2900]   Physical Exam: General: Well developed, well nourished, in no acute distress. Head:  Normocephalic and atraumatic. Minimal JVD Lungs: Minimal crackles at bases. Heart: Normal S1 and S2.  No murmur, rubs or gallops.  Abdomen: soft, non-tender, positive bowel sounds. Extremities: No clubbing or cyanosis. No edema. Right leg surgical scar noted.  Neurologic: Alert and oriented x 3.    Lab Results:  Recent Labs  06/26/14 0307 06/28/14 0500  WBC 7.9 6.4  HGB 10.3* 10.0*  PLT 216 236    Telemetry: NSR Personally viewed.    Cardiac Studies:  EF 25%,  Apical akinesis no change.  Assessment/Plan:   63 year old with chronic systolic heart failure EF 25% with apical akinesis with suspected cardioembolic source from apex to lower extremity post thrombus extraction.   1) Arterial thrombus  - suspected cardiac source, has apical akinesis, nidus for development of thrombus.   - Agree with Dr. Acie Fredrickson that we should treat with coumadin from this point on.   - I wrote order for coumadin per pharmacy. On heparin IV.  - Once therapeutic (INR 2-3) would stop ASA and continue Plavix and Coumadin.   - ECHO with no change from prior, EF 25%, apical akinesis.   2) Chronic systolic heart failure  - Off Dopamine IV   - RestartingToprol XL 25 (200 at home), holding Hydralazine 75 TID, will add back Lasix 60 BID.   - I will slowly add back these medications as BP allows.   - BP in outpatient setting 130's SBP.  -  No signs currently of fluid overload (minimal basilar crackles). Continue spironolactone. If respiratory rate increases or dyspnea worsens (states he is at baseline) will add back lasix sooner rather than later).  - Continue Imdur, Dig, Spironolactone.   - Stopped Indomethacin (NSAID can exacerbate CHF)  3) CAD  - Stable.   4) Chronic anticoagulation  - As above  5) Hypotension  - Improved   6) Gout  - Stable, allopurinol.   - Stopped chronic indocin.   7) Interstitial lung disease  - Dr. Chase Caller  - Lung biopsy on hold.    Will follow. Complex situation.    Adam House, [place] 06/28/2014, 10:07 AM

## 2014-06-28 NOTE — Telephone Encounter (Signed)
Opened erroneously

## 2014-06-29 ENCOUNTER — Ambulatory Visit: Payer: BC Managed Care – PPO | Admitting: Internal Medicine

## 2014-06-29 ENCOUNTER — Encounter (HOSPITAL_COMMUNITY)
Admission: EM | Disposition: A | Discharge status: Home Health | Payer: Self-pay | Source: Home / Self Care | Attending: Vascular Surgery

## 2014-06-29 DIAGNOSIS — Z95810 Presence of automatic (implantable) cardiac defibrillator: Secondary | ICD-10-CM

## 2014-06-29 DIAGNOSIS — N182 Chronic kidney disease, stage 2 (mild): Secondary | ICD-10-CM

## 2014-06-29 DIAGNOSIS — I5023 Acute on chronic systolic (congestive) heart failure: Secondary | ICD-10-CM

## 2014-06-29 DIAGNOSIS — I472 Ventricular tachycardia: Secondary | ICD-10-CM

## 2014-06-29 LAB — PROTIME-INR
INR: 2.46 — ABNORMAL HIGH (ref 0.00–1.49)
INR: 2.74 — ABNORMAL HIGH (ref 0.00–1.49)
PROTHROMBIN TIME: 29.2 s — AB (ref 11.6–15.2)
Prothrombin Time: 26.9 s — ABNORMAL HIGH (ref 11.6–15.2)

## 2014-06-29 LAB — CBC
HCT: 31.6 % — ABNORMAL LOW (ref 39.0–52.0)
HEMOGLOBIN: 10.8 g/dL — AB (ref 13.0–17.0)
MCH: 27.9 pg (ref 26.0–34.0)
MCHC: 34.2 g/dL (ref 30.0–36.0)
MCV: 81.7 fL (ref 78.0–100.0)
Platelets: 257 10*3/uL (ref 150–400)
RBC: 3.87 MIL/uL — AB (ref 4.22–5.81)
RDW: 16.9 % — ABNORMAL HIGH (ref 11.5–15.5)
WBC: 6.7 10*3/uL (ref 4.0–10.5)

## 2014-06-29 LAB — BASIC METABOLIC PANEL WITH GFR
Anion gap: 16 — ABNORMAL HIGH (ref 5–15)
BUN: 21 mg/dL (ref 6–23)
CO2: 21 meq/L (ref 19–32)
Calcium: 9.2 mg/dL (ref 8.4–10.5)
Chloride: 106 meq/L (ref 96–112)
Creatinine, Ser: 1.75 mg/dL — ABNORMAL HIGH (ref 0.50–1.35)
GFR calc Af Amer: 46 mL/min — ABNORMAL LOW
GFR calc non Af Amer: 40 mL/min — ABNORMAL LOW
Glucose, Bld: 94 mg/dL (ref 70–99)
Potassium: 3.8 meq/L (ref 3.7–5.3)
Sodium: 143 meq/L (ref 137–147)

## 2014-06-29 LAB — HEPATIC FUNCTION PANEL
ALT: 15 U/L (ref 0–53)
AST: 56 U/L — ABNORMAL HIGH (ref 0–37)
Albumin: 3.1 g/dL — ABNORMAL LOW (ref 3.5–5.2)
Alkaline Phosphatase: 74 U/L (ref 39–117)
Bilirubin, Direct: <0.2 mg/dL (ref 0.0–0.3)
Total Bilirubin: 0.6 mg/dL (ref 0.3–1.2)
Total Protein: 7.1 g/dL (ref 6.0–8.3)

## 2014-06-29 LAB — HEPARIN LEVEL (UNFRACTIONATED): HEPARIN UNFRACTIONATED: 0.48 [IU]/mL (ref 0.30–0.70)

## 2014-06-29 LAB — MAGNESIUM: Magnesium: 2.2 mg/dL (ref 1.5–2.5)

## 2014-06-29 SURGERY — FASCIOTOMY CLOSURE
Anesthesia: General | Site: Leg Lower | Laterality: Right

## 2014-06-29 MED ORDER — POTASSIUM CHLORIDE ER 10 MEQ PO TBCR
20.0000 meq | EXTENDED_RELEASE_TABLET | Freq: Once | ORAL | Status: AC
  Administered 2014-06-29: 20 meq via ORAL
  Filled 2014-06-29: qty 2

## 2014-06-29 MED ORDER — METOPROLOL SUCCINATE ER 100 MG PO TB24
100.0000 mg | ORAL_TABLET | Freq: Every day | ORAL | Status: DC
  Administered 2014-06-30 – 2014-07-01 (×2): 100 mg via ORAL
  Filled 2014-06-29 (×2): qty 1

## 2014-06-29 MED ORDER — SOTALOL HCL 80 MG PO TABS
80.0000 mg | ORAL_TABLET | Freq: Two times a day (BID) | ORAL | Status: DC
  Administered 2014-06-29 – 2014-07-01 (×5): 80 mg via ORAL
  Filled 2014-06-29 (×6): qty 1

## 2014-06-29 NOTE — Consult Note (Signed)
ELECTROPHYSIOLOGY CONSULT NOTE    Patient ID: Adam House MRN: [id number], DOB/AGE: 01-11-1951 63 y.o.  Admit date: 06/24/2014 Date of Consult: 06-29-14  Primary Physician: Rosita Fire, MD Primary Cardiologist: Bronson Ing Electrophysiologist: Kimela Malstrom  Reason for Consultation: VT  HPI:  Adam House is a 63 y.o. male with a past medical history significant for CAD, ischemic cardiomyopathy, systolic heart failure, PVD, hypertension, prior CVA, and interstitial lung disease (followed by Dr Chase Caller pending VATS with Roxan Hockey).    He was admitted on 06-24-14 with acute thromboembolism of right foot and underwent thrombectomy and fasciotomy at that time.  He has done well post procedure and was pending closure of fasciotomy today.  Yesterday, he developed ventricular tachycardia that was pace terminated by his device.  He has had 6 episodes since yesterday with the last being around 6:58 this morning.  All cycle lengths are around 390msec.  Initially device was able to pace terminate with 1 round of ATP; most recent episodes have required up to 4 rounds before termination.    He has never been on AAD therapy.  Home dose of Metoprolol was 200mg  daily; he was on 25mg  daily until yesterday during admission, changed to 50mg  as of this morning.    He currently denies chest pain, shortness of breath, fever, chills. He does have incisional pain and has also had recent loose stools.  He has not had dizziness or pre-syncope.  ROS is negative except as outlined above.   Echo 06-27-14 demonstrated EF 20-25%, diffuse hypokinesis, akinesis of apical myocardium, LA 51.   EP has been asked to evaluate for treatment options.   Past Medical History  Diagnosis Date  . CAD (coronary artery disease)     a. ant MI 1998 with PCI to LAD. b. Ant-lat MI 04/2012 tx with overlapping DES to LAD.  [name] Systolic CHF     NYHA Class II/III  . Ischemic cardiomyopathy     EF 25-30% by echo 04/2012; [place]  . CRI (chronic renal insufficiency)   . Unspecified essential hypertension   . Impotence of organic origin   . Esophageal reflux   . Tobacco use disorder   . ICD (implantable cardiac defibrillator) in place   . Hypercholesteremia   . Heart murmur   . CVA (cerebral infarction) 11/2010  . Gout   . 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 Lung disease, interstitial      Surgical History:  Past Surgical History  Procedure Laterality Date  . Anterior cervical decomp/discectomy fusion  2007    "put in 4 screws to hold my head on; ruptured C5; job related injury" (08/24/2012)  . Cardiac defibrillator placement  2011    [employer]  he is a MADIT RIT study patient  . Coronary angioplasty with stent placement  05/1997; 04/2012    "1 + 2; total of 3" (08/24/2012)  . Embolectomy Right 06/24/2014    Procedure: RIGHT FEMORAL EMBOLECTOMY,  ;  Surgeon: Angelia Mould, MD;  Location: [facility] OR;  Service: Vascular;  Laterality: Right;  Right Femoral Embolectomy, with bovine patch angioplasty,.  . Fasciotomy Left 06/24/2014    Procedure: FASCIOTOMY;  Surgeon: Angelia Mould, MD;  Location: [place];  Service: Vascular;  Laterality: Left;  four compartment Fasciotomy.     Prescriptions prior to admission  Medication Sig Dispense Refill  . albuterol (PROVENTIL HFA;VENTOLIN HFA) 108 (90 BASE) MCG/ACT inhaler Inhale 2 puffs into the lungs every 6 (six) hours as needed  for wheezing or shortness of breath. For shortness of breath      . allopurinol (ZYLOPRIM) 100 MG tablet Take 100 mg by mouth daily.      [name] aspirin 81 MG tablet Take 81 mg by mouth daily.      . clopidogrel (PLAVIX) 75 MG tablet Take 75 mg by mouth daily.      . colchicine 0.6 MG tablet Take 1 tablet (0.6 mg total) by mouth daily as needed. For gout  30 tablet  11  . digoxin (LANOXIN) 0.125 MG tablet Take 0.125 mg by mouth daily.      [name] ezetimibe (ZETIA) 10 MG tablet Take 1 tablet (10 mg total) by mouth daily.  28  tablet  0  . fish oil-omega-3 fatty acids 1000 MG capsule Take 2 g by mouth daily.      . furosemide (LASIX) 40 MG tablet Take 1.5 tablets (60 mg total) by mouth 2 (two) times daily.  90 tablet  6  . hydrALAZINE (APRESOLINE) 25 MG tablet Take 3 tablets (75 mg total) by mouth 3 (three) times daily.  270 tablet  6  . indomethacin (INDOCIN) 25 MG capsule Take 25 mg by mouth every 6 (six) hours.      . isosorbide dinitrate (DILATRATE-SR) 40 MG CR capsule Take 1 capsule (40 mg total) by mouth 3 (three) times daily.  90 capsule  6  . losartan (COZAAR) 25 MG tablet Take 1 tablet (25 mg total) by mouth daily.  30 tablet  6  . metoprolol succinate (TOPROL-XL) 200 MG 24 hr tablet Take 1 tablet (200 mg total) by mouth daily.  30 tablet  6  . multivitamin (ONE-A-DAY MEN'S) TABS Take 1 tablet by mouth daily.      . nitroGLYCERIN (NITROSTAT) 0.4 MG SL tablet Place 1 tablet (0.4 mg total) under the tongue every 5 (five) minutes as needed for chest pain (up to 3 doses).      . pantoprazole (PROTONIX) 40 MG tablet Take 1 tablet (40 mg total) by mouth as needed.  30 tablet  6  . potassium chloride SA (KLOR-CON M20) 20 MEQ tablet Take 1 tablet (20 mEq total) by mouth daily.  30 tablet  6  . spironolactone (ALDACTONE) 25 MG tablet Take 25 mg by mouth daily.        Inpatient Medications:  . allopurinol  100 mg Oral Daily  . aspirin  81 mg Oral Daily  . cefUROXime (ZINACEF)  IV  1.5 g Intravenous 60 min Pre-Op  . clopidogrel  75 mg Oral Daily  . digoxin  0.125 mg Oral Daily  . docusate sodium  100 mg Oral Daily  . ezetimibe  10 mg Oral Daily  . furosemide  60 mg Oral BID  . isosorbide dinitrate  40 mg Oral TID  . losartan  25 mg Oral Daily  . metoprolol succinate  50 mg Oral Daily  . multivitamin with minerals  1 tablet Oral Daily  . omega-3 acid ethyl esters  2 g Oral Daily  . pantoprazole  40 mg Oral Daily  . potassium chloride SA  20 mEq Oral Daily  . spironolactone  25 mg Oral Daily  . Warfarin -  Pharmacist Dosing Inpatient   Does not apply q1800    Allergies:  Allergies  Allergen Reactions  . Lisinopril Anaphylaxis and Swelling    angioedema  . Naproxen Hives    History   Social History  . Marital Status: Married    Spouse  Name: N/A    Number of Children: N/A  . Years of Education: N/A   Occupational History  . RETIRED    Social History Main Topics  . Smoking status: Former Smoker -- 0.50 packs/day for 40 years    Types: Cigarettes    Quit date: 02/13/2014  . Smokeless tobacco: Current User    Types: Snuff  . Alcohol Use: No     Comment: 08/24/2012 "used to drink; not anymore; stopped > 32yrs ago; never had any problems w/it"  . Drug Use: Yes    Special: Marijuana     Comment: 08/24/2012 "last drug use >4-5 months ago"  . Sexual Activity: Yes   Other Topics Concern  . Not on file   Social History Narrative   Last updated: 03/15/2010   Retired and lives in [place].  Previously worked for [employer].    Tobacco Use - 1PPD x 40+ years,  has quit x 3 days   ETOH- rare   Drugs- occasional marijuana            Family History  Problem Relation Age of Onset  . Diabetes Mother   . Heart disease Father     Physical Exam: Filed Vitals:   06/28/14 1426 06/29/14 0231 06/29/14 0500 06/29/14 0739  BP: 141/56 139/88 158/73 142/66  Pulse: 84 87 96 100  Temp: 97.4 F (36.3 C)  98 F (36.7 C)   TempSrc: Oral  Oral   Resp: 20  21   Height:      Weight:      SpO2: 97% 92% 96%     GEN- The patient is well appearing, alert and oriented x 3 today.   Head- normocephalic, atraumatic Eyes-  Sclera clear, conjunctiva pink Ears- hearing intact Oropharynx- clear Neck- supple, Lungs- Clear to ausculation bilaterally, normal work of breathing Heart- Regular rate and rhythm  GI- soft, NT, ND, + BS Extremities- no clubbing, cyanosis, or edema, leg dressing in place MS- no significant deformity or atrophy Skin- no rash or lesion Psych- euthymic mood, full  affect Neuro- strength and sensation are intact    Labs:   Lab Results  Component Value Date   WBC 6.7 06/29/2014   HGB 10.8* 06/29/2014   HCT 31.6* 06/29/2014   MCV 81.7 06/29/2014   PLT 257 06/29/2014    Recent Labs Lab 06/29/14 0305  NA 143  K 3.8  CL 106  CO2 21  BUN 21  CREATININE 1.75*  CALCIUM 9.2  GLUCOSE 94    Radiology/Studies: Ct Head Wo Contrast 06/25/2014   CLINICAL DATA:  Acute onset left-sided weakness  EXAM: CT HEAD WITHOUT CONTRAST  TECHNIQUE: Contiguous axial images were obtained from the base of the skull through the vertex without intravenous contrast.  COMPARISON:  November 23, 2010  FINDINGS: The ventricles are normal in size and configuration. There is no mass, hemorrhage, extra-axial fluid collection, or midline shift. There is rather minimal periventricular small vessel disease in the centra semiovale bilaterally. Elsewhere gray-white compartments appear normal. There is no demonstrable acute infarct. The middle cerebral arteries do not show appreciable increased attenuation on either side. The bony calvarium appears intact. The mastoid air cells are clear. There is mild mucosal thickening in several ethmoid air cells bilaterally.  IMPRESSION: Mild mucosal thickening in several ethmoid air cells bilaterally. No intracranial mass, hemorrhage, or acute appearing infarct.   Electronically Signed   By: Lowella Grip M.D.   On: 06/25/2014 13:59   Dg Ang/ext/uni/or Right  06/25/2014   CLINICAL DATA:  Right lower extremity ischemia and right femoral embolectomy with patchy angioplasty of the right common femoral artery.  EXAM: RIGHT ANG/EXT/UNI/ OR  COMPARISON:  None.  FINDINGS: Intraoperative image demonstrates patent visualized popliteal artery and proximal aspects of the tibioperoneal trunk and anterior tibial artery. There does appear to be spasm limiting outflow distally in the calf.  IMPRESSION: Probable spasm of tibial vessels below the knee. The visualized  popliteal artery appears normally patent.   Electronically Signed   By: Aletta Edouard M.D.   On: 06/25/2014 14:54    EKG: reviewed Echo is reviewed Epic records are reviewed  TELEMETRY: sinus rhythm with intermittent pace terminated VT episodes.   DEVICE HISTORY: BSX Teligen dual chamber ICD implanted 2011 by Dr Rayann Heman.  Device interrogation today demonstrates normal device function with VT episodes as outlined above.   A/P 1. VT The patient has recurrent VT at 298 msec.  This is likely due to increased catechols post op and reduced metoprolol.  He is clinically stable.  ICD is interrogated and reveals normal device function with ATP termination of all episodes of VT. I will increase metoprolol XL to 100mg  daily. Add sotalol 80mg  BID and follow QTc.  I would be reluctant to use amiodarone at this time due to chronic lung disease Check tfts and electrolytes No indication to transfer to stepdown presently but will follow closely  2. CAD/ ischemic CM No ischemic symptoms/ euvolemic Increase metoprolol as above On ASA and plavix  3. Acute R leg thrombosis Possibly secondary to embolic event from LV.  He did not have recent afib by ICD interrogation. INR is now therapeutic Stop heparin Will defer decision to keep on ASA, plavix, and coumadin to vascular team  EP to follow closely while here.

## 2014-06-29 NOTE — Progress Notes (Addendum)
    Subjective:  VT yesterday. EP note reviewed. Increased metoprolol.   Objective:  Vital Signs in the last 24 hours: Temp:  [97.4 F (36.3 C)-98 F (36.7 C)] 98 F (36.7 C) (10/15 0500) Pulse Rate:  [84-100] 84 (10/15 1100) Resp:  [20-21] 21 (10/15 0500) BP: (139-158)/(56-88) 142/66 mmHg (10/15 0739) SpO2:  [92 %-97 %] 96 % (10/15 0500)  Intake/Output from previous day: 10/14 0701 - 10/15 0700 In: 240 [P.O.:240] Out: 1200 [Urine:1200]   Physical Exam: General: Well developed, well nourished, in no acute distress. Head:  Normocephalic and atraumatic. Minimal JVD Lungs: Minimal crackles at bases. Heart: Normal S1 and S2.  No murmur, rubs or gallops.  Abdomen: soft, non-tender, positive bowel sounds. Extremities: No clubbing or cyanosis. No edema. Right leg surgical scar noted.  Neurologic: Alert and oriented x 3.    Lab Results:  Recent Labs  06/28/14 0500 06/29/14 0305  WBC 6.4 6.7  HGB 10.0* 10.8*  PLT 236 257    Telemetry: NSR Personally viewed.    Cardiac Studies:  EF 25%,  Apical akinesis no change.  Assessment/Plan:   63 year old with chronic systolic heart failure EF 25% with apical akinesis with suspected cardioembolic source from apex to lower extremity post thrombus extraction. NSVT bouts yesterday.    1) Arterial thrombus  - suspected cardiac source, has apical akinesis, nidus for development of thrombus.   - Agree with Dr. Acie Fredrickson that we should treat with coumadin from this point on.   - I wrote order for coumadin per pharmacy. On heparin IV.  - Once therapeutic (INR 2-3) would stop ASA and continue Plavix and Coumadin.   - ECHO with no change from prior, EF 25%, apical akinesis.   2) Chronic systolic heart failure/NSVT  - Off Dopamine IV   - K, MAG OK. EP consult done.   - IncreasedToprol XL from 25 to 100mg  per EP. (200 at home), holding Hydralazine 75 TID  - Sotolol started. May not need long term if beta blocker able to return to 200 QD.     - continue Lasix 60 BID.   - increase these medications as BP allows.   - BP in outpatient setting 130's SBP.  - No signs currently of fluid overload (minimal basilar crackles). Continue spironolactone. If respiratory rate increases or dyspnea worsens (states he is at baseline) will add back lasix sooner rather than later).  - Continue Imdur, Dig, Spironolactone.   - Stopped Indomethacin (NSAID can exacerbate CHF)  - avoid amio.   3) CAD  - Stable.   4) Chronic anticoagulation  - As above  5) Hypotension  - Improved   6) Gout  - Stable, allopurinol.   - Stopped chronic indocin.   7) Interstitial lung disease  - Dr. Chase Caller  - Lung biopsy on hold.    Will follow. Complex situation.    Carleigh Buccieri, [place] 06/29/2014, 11:57 AM

## 2014-06-29 NOTE — Progress Notes (Signed)
Patient has 60 runs of V-Tach,no ICD firing,patient is asymptomatic,BP is 139/88,EKG was done.Dr.M Elias Else made aware with orders to check potassium and magnesium stat.Will continue to monitor patient closely. Nikash Mortensen Netta Corrigan RN

## 2014-06-29 NOTE — Progress Notes (Signed)
   VASCULAR SURGERY ASSESSMENT & PLAN:  * 5 Days Post-Op s/p: right femoral embolectomy and 4 compartment fasciotomy  * I had him scheduled for closure of his fasciotomy this AM, but he had several runs of V. Tach. Cardiology to evaluate again this AM. Currently sinus rhythm in the 80's. His K is 3.8 this AM. Will supplement and check Mg level also.   * On heparin and Coumadin per pharmacy for embolic event. INR = 2.46.   * The fasciotomy site is still quite swollen. Will use negative pressure dsg instead of closing it.   SUBJECTIVE: No specific complaints.  PHYSICAL EXAM: Filed Vitals:   06/28/14 0825 06/28/14 1426 06/29/14 0231 06/29/14 0500  BP: 124/70 141/56 139/88 158/73  Pulse: 79 84 87 96  Temp: 97.9 F (36.6 C) 97.4 F (36.3 C)  98 F (36.7 C)  TempSrc: Oral Oral  Oral  Resp: 20 20  21   Height:      Weight:      SpO2:  97% 92% 96%   Palpable Right DP pulse Fasciotomy site still swollen. Muscles looks good. Wound measures 4 cm in width by 14 cm in length.  Right groin incision looks fine.  LABS: Lab Results  Component Value Date   WBC 6.7 06/29/2014   HGB 10.8* 06/29/2014   HCT 31.6* 06/29/2014   MCV 81.7 06/29/2014   PLT 257 06/29/2014   Lab Results  Component Value Date   CREATININE 1.75* 06/29/2014   Lab Results  Component Value Date   INR 2.46* 06/29/2014   Active Problems:   Ischemia of lower extremity   Gae Gallop Beeper: [phone] 06/29/2014

## 2014-06-29 NOTE — Progress Notes (Signed)
Patient has several runs(episodes)of V-tach since 2 AM.Dr Elias Else has ordered to give the morning dose of metoprolol 50 mg PO,with suggestion to delay OR schedule today for further evaluation from cardiology this morning.OR charge nurse(Darlin) made aware.Dr. Oneida Alar was also informed about the event,with order to keep NPO.Will continue to monitor closely. Yamili Lichtenwalner Netta Corrigan RN

## 2014-06-29 NOTE — Progress Notes (Signed)
Cardiology cross-cover note Overnight, patient has had several (4+) runs of Vtach that has been aborted with anti-tachycardia pacing on his ICD. This is after 2 runs yesterday. Beta blocker has been increased. Lytes chest and K and Mg wnl.   Depending on the urgency of the surgery, if able, recommend delaying surgery planned for today given that he has been having significant arrhythmias which would increase the risk of surgery. Will continue to escalate beta blocker as feasible; may benefit from antiarrhythmic suppressive therapy; will discuss with EP.

## 2014-06-29 NOTE — Progress Notes (Signed)
ANTICOAGULATION CONSULT NOTE - Follow Up Consult  Pharmacy Consult for Heparin and Coumadin Indication: arterial embolism right leg; s/p embolectomy on 06/24/14  Allergies  Allergen Reactions  . Lisinopril Anaphylaxis and Swelling    angioedema  . Naproxen Hives    Patient Measurements: Height: 5' 9.5" (176.5 cm) Weight: 204 lb 2.3 oz (92.6 kg) IBW/kg (Calculated) : 71.85 Heparin Dosing Weight: 92 kg  Vital Signs: Temp: 98 F (36.7 C) (10/15 0500) Temp Source: Oral (10/15 0500) BP: 142/66 mmHg (10/15 0739) Pulse Rate: 84 (10/15 1100)  Labs:  Recent Labs  06/28/14 0500 06/28/14 1230 06/28/14 2105 06/29/14 0305 06/29/14 0950  HGB 10.0*  --   --  10.8*  --   HCT 29.7*  --   --  31.6*  --   PLT 236  --   --  257  --   LABPROT 16.2*  --   --  26.9* 29.2*  INR 1.30  --   --  2.46* 2.74*  HEPARINUNFRC 0.18* 0.22* 0.45 0.48  --   CREATININE  --   --   --  1.75*  --     Estimated Creatinine Clearance: 49 ml/min (by C-G formula based on Cr of 1.75).  Assessment:   POD# 5 right femoral embolectomy.   Heparin level 0.48 this am at goal on heparin drip 2150 uts/hr  - CBC stable, no bleeding noted - stopped today with increase INR > 2      INR 1.30> 2.46 ( level confirmed with recheck) after 2 doses warfarin - will hold dose tonight  Was for possible closure of fasciotomy but still with swelling.     Also on Aspirin 81 mg and Plavix 75 mg daily. Per Cardiology not 10/13, Aspirin to stop when INR >2.   Plavix to continue.  Goal of Therapy:  INR 2-3 Heparin level 0.3-0.7 units/ml Monitor platelets by anticoagulation protocol: Yes   Plan:  Stop Heparin  No coumadin today  Continue daily PT/INR and CBC.  Stop Aspirin   Continue Plavix 75 mg daily.  Discussed Coumadin use, precautions, monitoring and potential drug-drug and drug-food interactions.  Bonnita Nasuti Pharm.D. CPP, BCPS Clinical Pharmacist [phone] 06/29/2014 12:35 PM

## 2014-06-29 NOTE — Progress Notes (Signed)
Called RT for PRN breathing tx due to pt's shortness of breath, pt 94% on 2L [place] Rickard Rhymes, RN

## 2014-06-30 LAB — BASIC METABOLIC PANEL
ANION GAP: 14 (ref 5–15)
BUN: 19 mg/dL (ref 6–23)
CALCIUM: 9.5 mg/dL (ref 8.4–10.5)
CO2: 24 meq/L (ref 19–32)
CREATININE: 1.64 mg/dL — AB (ref 0.50–1.35)
Chloride: 102 mEq/L (ref 96–112)
GFR calc Af Amer: 50 mL/min — ABNORMAL LOW (ref >90)
GFR calc non Af Amer: 43 mL/min — ABNORMAL LOW (ref >90)
Glucose, Bld: 94 mg/dL (ref 70–99)
Potassium: 3.6 mEq/L — ABNORMAL LOW (ref 3.7–5.3)
SODIUM: 140 meq/L (ref 137–147)

## 2014-06-30 LAB — CBC
HCT: 33.3 % — ABNORMAL LOW (ref 39.0–52.0)
Hemoglobin: 11.5 g/dL — ABNORMAL LOW (ref 13.0–17.0)
MCH: 27.9 pg (ref 26.0–34.0)
MCHC: 34.5 g/dL (ref 30.0–36.0)
MCV: 80.8 fL (ref 78.0–100.0)
PLATELETS: 293 10*3/uL (ref 150–400)
RBC: 4.12 MIL/uL — ABNORMAL LOW (ref 4.22–5.81)
RDW: 16.6 % — AB (ref 11.5–15.5)
WBC: 7.4 10*3/uL (ref 4.0–10.5)

## 2014-06-30 LAB — TSH: TSH: 6.65 u[IU]/mL — AB (ref 0.350–4.500)

## 2014-06-30 LAB — PROTIME-INR
INR: 2.4 — AB (ref 0.00–1.49)
Prothrombin Time: 26.4 seconds — ABNORMAL HIGH (ref 11.6–15.2)

## 2014-06-30 LAB — MAGNESIUM: Magnesium: 1.6 mg/dL (ref 1.5–2.5)

## 2014-06-30 MED ORDER — POTASSIUM CHLORIDE CRYS ER 20 MEQ PO TBCR
40.0000 meq | EXTENDED_RELEASE_TABLET | Freq: Once | ORAL | Status: AC
  Administered 2014-06-30: 40 meq via ORAL

## 2014-06-30 MED ORDER — OXYCODONE HCL 5 MG PO TABS: 5.0000 mg | ORAL_TABLET | Freq: Four times a day (QID) | ORAL | Status: DC | PRN

## 2014-06-30 MED ORDER — WARFARIN SODIUM 3 MG PO TABS
3.0000 mg | ORAL_TABLET | Freq: Once | ORAL | Status: AC
  Administered 2014-06-30: 3 mg via ORAL
  Filled 2014-06-30: qty 1

## 2014-06-30 MED ORDER — METOPROLOL SUCCINATE ER 100 MG PO TB24: 100.0000 mg | ORAL_TABLET | Freq: Every day | ORAL | Status: DC

## 2014-06-30 MED ORDER — MAGNESIUM SULFATE 40 MG/ML IJ SOLN
2.0000 g | Freq: Once | INTRAMUSCULAR | Status: AC
  Administered 2014-06-30: 2 g via INTRAVENOUS
  Filled 2014-06-30: qty 50

## 2014-06-30 MED ORDER — FUROSEMIDE 40 MG PO TABS
60.0000 mg | ORAL_TABLET | Freq: Two times a day (BID) | ORAL | Status: DC
Start: 2014-06-30 — End: 2014-07-11

## 2014-06-30 MED ORDER — WARFARIN SODIUM 5 MG PO TABS: 5.0000 mg | ORAL_TABLET | Freq: Every day | ORAL | Status: DC

## 2014-06-30 MED ORDER — SOTALOL HCL 80 MG PO TABS: 80.0000 mg | ORAL_TABLET | Freq: Two times a day (BID) | ORAL | Status: DC

## 2014-06-30 NOTE — Progress Notes (Signed)
Physical Therapy Treatment Patient Details Name: Adam House MRN: [id number] DOB: 07-Nov-1950 Today's Date: 06/30/2014    History of Present Illness      PT Comments    Excellent progress with mobility.  SaO2 at 85% after ambulation on RA.  Pt declined stair training reporting he completed with someone the other day.  Unable to locate completion of stairs in progress notes.  Follow Up Recommendations  Home health PT;Supervision - Intermittent     Equipment Recommendations  None recommended by PT    Recommendations for Other Services       Precautions / Restrictions Precautions Precautions: Fall Restrictions Weight Bearing Restrictions: No RLE Weight Bearing: Weight bearing as tolerated    Mobility  Bed Mobility Overal bed mobility: Modified Independent                Transfers   Equipment used: None Transfers: Stand Pivot Transfers Sit to Stand: Modified independent (Device/Increase time) Stand pivot transfers: Modified independent (Device/Increase time)          Ambulation/Gait Ambulation/Gait assistance: Supervision Ambulation Distance (Feet): 300 Feet Assistive device: None Gait Pattern/deviations: Step-through pattern;Antalgic Gait velocity: decreased   General Gait Details: Pt ambulated on RA.  SaO2 97% prior to Rx and 85% after ambulation.  Pt placed back on 2 L O2 via [place] and SaO2 increased to 93%.   Stairs            Wheelchair Mobility    Modified Rankin (Stroke Patients Only)       Balance                                    Cognition Arousal/Alertness: Awake/alert Behavior During Therapy: WFL for tasks assessed/performed Overall Cognitive Status: Within Functional Limits for tasks assessed                      Exercises      General Comments        Pertinent Vitals/Pain Pain Assessment: No/denies pain    Home Living                      Prior Function            PT  Goals (current goals can now be found in the care plan section) Progress towards PT goals: Progressing toward goals    Frequency  Min 3X/week    PT Plan Current plan remains appropriate    Co-evaluation             End of Session Equipment Utilized During Treatment: Gait belt Activity Tolerance: Patient tolerated treatment well Patient left: in bed;with call bell/phone within reach     Time: 1043-1057 PT Time Calculation (min): 14 min  Charges:  $Gait Training: 8-22 mins                    G Codes:      Adam House 06/30/2014, 11:03 AM

## 2014-06-30 NOTE — Progress Notes (Signed)
PRN tx given at RN's request for SOB. Tx given, pt tolerating well. O2 weaned from 2L [place] to 1L [place] per O2 sats of 99%. RT will continue to monitor.

## 2014-06-30 NOTE — Progress Notes (Signed)
Subjective: Denies CP, SOB, dizziness or additional arrhythmias Wound vac is now in place.  He denies pain.  Objective: Vital signs in last 24 hours: Temp:  [97.4 F (36.3 C)-98.9 F (37.2 C)] 98.1 F (36.7 C) (10/16 0508) Pulse Rate:  [63-73] 63 (10/16 0725) Resp:  [18-20] 18 (10/16 0508) BP: (109-123)/(55-73) 111/71 mmHg (10/16 0725) SpO2:  [90 %-100 %] 90 % (10/16 0508) Weight change:  Last BM Date: 06/27/14 Intake/Output from previous day: -262 10/15 0701 - 10/16 0700 In: 240 [P.O.:240] Out: 502 [Urine:500; Stool:2] Intake/Output this shift: Total I/O In: 120 [P.O.:120] Out: -   PE: General:Pleasant affect, NAD Skin:Warm and dry, brisk capillary refill HEENT:normocephalic, sclera clear, mucus membranes moist Heart:S1S2 RRR without murmur, gallup, rub or click Lungs: without rales, rhonchi, or wheezes SKA:JGOT, non tender, + BS, do not palpate liver spleen or masses Ext:no lower ext edema, 2+ pedal pulses, 2+ radial pulses, wound vac on rt lower ext Neuro:alert and oriented X 3, MAE, follows commands, + facial symmetry   Lab Results:  Recent Labs  06/29/14 0305 06/30/14 0420  WBC 6.7 7.4  HGB 10.8* 11.5*  HCT 31.6* 33.3*  PLT 257 293   BMET  Recent Labs  06/29/14 0305 06/30/14 0420  NA 143 140  K 3.8 3.6*  CL 106 102  CO2 21 24  GLUCOSE 94 94  BUN 21 19  CREATININE 1.75* 1.64*  CALCIUM 9.2 9.5   No results found for this basename: TROPONINI, CK, MB,  in the last 72 hours  Lab Results  Component Value Date   CHOL 148 08/25/2012   HDL 23* 08/25/2012   LDLCALC 103* 08/25/2012   TRIG 112 08/25/2012   CHOLHDL 6.4 08/25/2012   No results found for this basename: HGBA1C     Lab Results  Component Value Date   TSH 6.650* 06/30/2014    Hepatic Function Panel  Recent Labs  06/29/14 0244  PROT 7.1  ALBUMIN 3.1*  AST 56*  ALT 15  ALKPHOS 74  BILITOT 0.6  BILIDIR <0.2  IBILI NOT CALCULATED   Medications: I have reviewed  the patient's current medications. Scheduled Meds: . allopurinol  100 mg Oral Daily  . cefUROXime (ZINACEF)  IV  1.5 g Intravenous 60 min Pre-Op  . clopidogrel  75 mg Oral Daily  . digoxin  0.125 mg Oral Daily  . docusate sodium  100 mg Oral Daily  . ezetimibe  10 mg Oral Daily  . furosemide  60 mg Oral BID  . isosorbide dinitrate  40 mg Oral TID  . losartan  25 mg Oral Daily  . metoprolol succinate  100 mg Oral Daily  . multivitamin with minerals  1 tablet Oral Daily  . omega-3 acid ethyl esters  2 g Oral Daily  . pantoprazole  40 mg Oral Daily  . potassium chloride SA  20 mEq Oral Daily  . sotalol  80 mg Oral Q12H  . spironolactone  25 mg Oral Daily  . Warfarin - Pharmacist Dosing Inpatient   Does not apply q1800   Continuous Infusions: . dextrose 5 % and 0.45% NaCl 75 mL/hr at 06/26/14 2300   PRN Meds:.acetaminophen, acetaminophen, albuterol, alum & mag hydroxide-simeth, colchicine, guaiFENesin-dextromethorphan, HYDROmorphone (DILAUDID) injection, labetalol, magnesium sulfate 1 - 4 g bolus IVPB, metoprolol, nitroGLYCERIN, ondansetron, oxyCODONE-acetaminophen, phenol, potassium chloride  DEVICE HISTORY: BSX Teligen dual chamber ICD implanted 2011 by Dr Rayann Heman. Device interrogation today demonstrates normal device function with VT episodes  as outlined above  Assessment/Plan: A/P  1. VT  The patient has recurrent VT at 298 msec. This is likely due to increased catechols post op and reduced metoprolol. He is clinically stable. ICD is interrogated and reveals normal device function with ATP termination of all episodes of VT.  Continue sotalol 80mg  BID and follow QTc. 440 ms today- Dr. Rayann Heman would be reluctant to use amiodarone at this time due to chronic lung disease  Keep K >3.9 and Mg .1.9  TSH 6.650--> check T4  2. CAD/ ischemic CM  No ischemic symptoms/ euvolemic  Increase metoprolol as above  On ASA and plavix   3. Acute R leg thrombosis  Possibly secondary to embolic  event from LV. He did not have recent afib by ICD interrogation.  INR is now therapeutic  heparin stopped Will defer decision to keep on ASA, plavix, and coumadin to vascular team   4.  Anticoagulation INR 2.40  Today  EP to follow closely while here.      LOS: 6 days   Time spent with pt. :15 minutes. [facility] R  Nurse Practitioner Certified Pager [phone] or after 5pm and on weekends call [phone] 06/30/2014, 11:49 AM   I have seen, examined the patient, and reviewed the above assessment and plan.  Changes to above are made where necessary.  Doing well with increased metoprolol and added sotalol.  Qt appears stable Will replete electrolytes and check T4. If stable qt tomorrow and no further arrhythmias, could discharge from an EP standpoint No driving x 6 months (pt is aware)  Co Sign: Thompson Grayer, MD 06/30/2014 12:09 PM

## 2014-06-30 NOTE — Discharge Summary (Signed)
Discharge Summary    Adam House 1950/10/30 63 y.o. male  [id number]  Admission Date: 06/24/2014  Discharge Date: 07/01/14  Physician: Angelia Mould, MD  Admission Diagnosis: Critical ischemia of lower extremity [I99.8]   HPI:   This is a 63 y.o. male who developed the sudden onset of pain and paresthesias in his right foot at 4 AM this morning. He was evaluated at [facility] and sent to [facility] with an ischemic right lower extremity. I do not get any clear-cut history of claudication or rest pain prior to this event. He has no history of atrial fibrillation. He does have a defibrillator it was placed and approximately 2010. He's had 2 previous myocardial infarctions one in 1998 and most recently in 2013. He does have ischemic cardiomyopathy with EF of 25-30%.  His chief complaint is severe pain in the right foot with paresthesias and decreased motor function.   Of note, he was seen by Dr. Roxan Hockey on 06/20/2014 because CT scan of the chest had shown interstitial lung disease. Dr. Roxan Hockey noted that he had pulmonary parenchymal fibrosis and interstitial pneumonitis. He was being scheduled for a thorascopic lung biopsy for a definitive diagnosis.  He was also seen in March of this year by Dr. Kellie Simmering falls and with carotid disease. He has a known right internal carotid artery occlusion with a 40-59% left carotid stenosis. At the time of his most recent office visit he had palpable femoral pulses.  Hospital Course:  The patient was admitted to the hospital and taken to the operating room on 06/24/2014 and underwent: 1. Right femoral embolectomy  2. Bovine pericardial patch angioplasty of the right common femoral artery  3. Intraoperative arteriogram  4. 4 compartment fasciotomy    The pt tolerated the procedure well and was transported to the PACU in good condition. He was placed on a heparin gtt post operatively.  On POD 1, he did have decreased  planar and dorsi flexion and the staples from the lateral fasciotomy site were removed.  The muscle looked well perfused.  Wet to dry dressings were used on this wound until POD 6 and a wound vac was placed.    On the afternoon of POD 1, he had a brief episode of decreased consciousness and ? Left arm weakness.  He was hypotensive and was started on dopamine.  This was eventually weaned successfully.   A CT of the head was unremarkable.  Carotid duplex revealed There right ICA appears occluded. The left distal CCA exhibits significant plaque with a 60-79% stenosis, highest end of scale, which is a significant change since study done 09/2013. There is 40-59% left ICA stenosis. Vertebral artery flow is antegrade. The left vertebral artery exhibits increased velocities.   Dr. Scot Dock does not recommend CTA or cerebral arteriogram unless a left CEA is being considered.  This will need to be followed closely and he has been scheduled for a carotid duplex in 6 months and f/u with Dr. Kellie Simmering.  Source of the embolus is not clear and cardiology consult was obtained. Cardiology states pt has hx of chronic systolic CHF and has akinesis of the apex by previous echo and it is suspected that he formed a thrombus in the apex, which embolized.  There is no evidence of a fib.  It is recommended for lifelong anticoagulation and he is started on coumadin.  Once INR was therapeutic, his aspirin is discontinued and he is continued on Plavix.  2D echocardiogram on 06/27/14 reveal  the following: Study Conclusions  - Left ventricle: The cavity size was mildly dilated. Wall thickness was increased in a pattern of mild LVH. Systolic function was severely reduced. The estimated ejection fraction was in the range of 20% to 25%. Diffuse hypokinesis. There is akinesis of the apical myocardium. Doppler parameters are consistent with restrictive physiology, indicative of decreased left ventricular diastolic compliance and/or  increased left atrial pressure. - Aortic valve: There was mild regurgitation. - Mitral valve: Calcified annulus. Mildly thickened leaflets . - Left atrium: The atrium was severely dilated. - Right ventricle: Systolic function was mildly reduced. - Right atrium: The atrium was mildly dilated.  Impressions:  - Global hypokinesis with apical akinesis; overall severely reduced LV function; mild LVE and LVH; restrictive filling; biatrial enlargement; mildly reduced RV function; mild AI.  By POD 3, he was off the dopamine and was transferred to the telemetry floor.  The pt's CKD 3 continues to be stable.  On 06/28/14, he had two 50 beat runs of NSVT and was not shocked.  He received and extra dose of Toprol XL that evening.  That evening he had several runs of Vtach that was aborted with anti-tachycardia pacing on his ICD and this was after the two previous runs.  His beta blocker was titrated up.  EP's recommendations on 06/29/14 are as follows: 1. VT  The patient has recurrent VT at 298 msec. This is likely due to increased catechols post op and reduced metoprolol. He is clinically stable. ICD is interrogated and reveals normal device function with ATP termination of all episodes of VT.  I will increase metoprolol XL to 100mg  daily.  Add sotalol 80mg  BID and follow QTc. I would be reluctant to use amiodarone at this time due to chronic lung disease  Check tfts and electrolytes  No indication to transfer to stepdown presently but will follow closely  2. CAD/ ischemic CM  No ischemic symptoms/ euvolemic  Increase metoprolol as above  On ASA and plavix  His TSH was 6.650 and a T4 was checked.  This was pending.  His wound vac was ready on 07/01/14 and he is discharged home.   Closure of his fasciotomy was cancelled due to his dysrhythmias.  It was still quite swollen, so a wound vac was placed.  Medication changes per cardiology: Coumadin started  Plavix continued Aspirin  discontinued Toprol XL changed to 100mg  daily Cozaar continued Sotalol added (80mg  bid) Digoxin continued Lasix increased to 60mg  bid from 40mg  bid Spironolactone continued  Dilatrate SR continued Hydralazine discontinued Indomethacin discontinued Allopurinol continued   The remainder of the hospital course consisted of increasing mobilization and increasing intake of solids without difficulty.  CBC    Component Value Date/Time   WBC 7.4 06/30/2014 0420   RBC 4.12* 06/30/2014 0420   HGB 11.5* 06/30/2014 0420   HCT 33.3* 06/30/2014 0420   PLT 293 06/30/2014 0420   MCV 80.8 06/30/2014 0420   MCH 27.9 06/30/2014 0420   MCHC 34.5 06/30/2014 0420   RDW 16.6* 06/30/2014 0420   LYMPHSABS 0.9 06/24/2014 1000   MONOABS 1.1* 06/24/2014 1000   EOSABS 0.2 06/24/2014 1000   BASOSABS 0.0 06/24/2014 1000    BMET    Component Value Date/Time   NA 140 06/30/2014 0420   K 3.6* 06/30/2014 0420   CL 102 06/30/2014 0420   CO2 24 06/30/2014 0420   GLUCOSE 94 06/30/2014 0420   BUN 19 06/30/2014 0420   CREATININE 1.64* 06/30/2014 0420   CREATININE  1.34 07/21/2013 1212   CALCIUM 9.5 06/30/2014 0420   GFRNONAA 43* 06/30/2014 0420   GFRAA 50* 06/30/2014 0420     Discharge Instructions:   The patient is discharged with extensive instructions on wound care and progressive ambulation.  They are instructed not to drive or perform any heavy lifting until returning to see the physician in his office.  Discharge Instructions   Call MD for:  redness, tenderness, or signs of infection (pain, swelling, bleeding, redness, odor or green/yellow discharge around incision site)    Complete by:  As directed      Call MD for:  severe or increased pain, loss or decreased feeling  in affected limb(s)    Complete by:  As directed      Call MD for:  temperature >100.5    Complete by:  As directed      Discharge wound care:    Complete by:  As directed   Wash the groin wound with soap and water daily and  pat dry. (No tub bath-only shower)  Then put a dry gauze or washcloth there to keep this area dry daily and as needed.  Do not use Vaseline or neosporin on your incisions.  Only use soap and water on your incisions and then protect and keep dry.     Driving Restrictions    Complete by:  As directed   No driving for 2 weeks     Lifting restrictions    Complete by:  As directed   No lifting for 4 weeks     Resume previous diet    Complete by:  As directed            Discharge Diagnosis:  Critical ischemia of lower extremity [I99.8]  Secondary Diagnosis: Patient Active Problem List   Diagnosis Date Noted  . Ischemia of lower extremity 06/24/2014  . Lung disease, interstitial   . ILD (interstitial lung disease) 06/05/2014  . Dyspnea and respiratory abnormality 05/01/2014  . Occlusion and stenosis of carotid artery without mention of cerebral infarction 12/06/2013  . 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 Carotid artery disease 10/28/2013  . Ventricular tachyarrhythmia 07/07/2013  . Tobacco abuse 09/03/2012  . Carotid bruit 09/03/2012  . Chronic systolic CHF (congestive heart failure) 08/25/2012  . NSTEMI (non-ST elevated myocardial infarction) 08/25/2012  . Hyperlipidemia 05/13/2012  . Lip swelling 04/26/2012  . STEMI (ST elevation myocardial infarction) 04/24/2012  . Acute on chronic systolic CHF (congestive heart failure) 04/24/2012  . Encounter for servicing of automatic implantable cardioverter-defibrillator (AICD) at end of battery life 04/24/2012  . Chronic kidney disease (CKD), stage II (mild) 04/24/2012  . Acute renal failure 04/24/2012  . Pericarditis 04/24/2012  . Other primary cardiomyopathies 03/15/2012  . Hypertension 02/17/2011  . NICOTINE ADDICTION 03/15/2010  . IMPLANTATION OF DEFIBRILLATOR, HX OF 01/24/2010  . CARDIOMYOPATHY, ISCHEMIC 12/03/2009  . ESSENTIAL HYPERTENSION, BENIGN 10/01/2009  . CAD  10/01/2009  . LEFT VENTRICULAR FUNCTION, DECREASED 10/01/2009  . AAA 10/01/2009  . AMI 09/28/2009  . RENAL FAILURE 09/28/2009  . 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 Chronic systolic dysfunction of left ventricle 09/28/2009  . Other and unspecified hyperlipidemia 07/12/2009   Past Medical History  Diagnosis Date  . CAD (coronary artery disease)     a. ant MI 1998 with PCI to LAD. b. Ant-lat MI 04/2012 tx with overlapping DES to LAD.  [name] Systolic CHF     NYHA Class II/III  . Ischemic  cardiomyopathy     EF 25-30% by echo 04/2012; [place]  . CRI (chronic renal insufficiency)   . Unspecified essential hypertension   . Impotence of organic origin   . Esophageal reflux   . Tobacco use disorder   . ICD (implantable cardiac defibrillator) in place   . Hypercholesteremia   . Heart murmur   . CVA (cerebral infarction) 11/2010  . Gout   . 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 Lung disease, interstitial        Medication List    STOP taking these medications       aspirin 81 MG tablet     hydrALAZINE 25 MG tablet  Commonly known as:  APRESOLINE     indomethacin 25 MG capsule  Commonly known as:  INDOCIN      TAKE these medications       albuterol 108 (90 BASE) MCG/ACT inhaler  Commonly known as:  PROVENTIL HFA;VENTOLIN HFA  Inhale 2 puffs into the lungs every 6 (six) hours as needed for wheezing or shortness of breath. For shortness of breath     allopurinol 100 MG tablet  Commonly known as:  ZYLOPRIM  Take 100 mg by mouth daily.     clopidogrel 75 MG tablet  Commonly known as:  PLAVIX  Take 75 mg by mouth daily.     colchicine 0.6 MG tablet  Take 1 tablet (0.6 mg total) by mouth daily as needed. For gout     digoxin 0.125 MG tablet  Commonly known as:  LANOXIN  Take 0.125 mg by mouth daily.     ezetimibe 10 MG tablet  Commonly known as:  ZETIA  Take 1 tablet (10 mg total) by mouth daily.     fish oil-omega-3 fatty acids 1000 MG capsule  Take  2 g by mouth daily.     furosemide 40 MG tablet  Commonly known as:  LASIX  Take 1.5 tablets (60 mg total) by mouth 2 (two) times daily.     isosorbide dinitrate 40 MG CR capsule  Commonly known as:  DILATRATE-SR  Take 1 capsule (40 mg total) by mouth 3 (three) times daily.     losartan 25 MG tablet  Commonly known as:  COZAAR  Take 1 tablet (25 mg total) by mouth daily.     metoprolol succinate 100 MG 24 hr tablet  Commonly known as:  TOPROL XL  Take 1 tablet (100 mg total) by mouth daily. Take with or immediately following a meal.     multivitamin Tabs tablet  Take 1 tablet by mouth daily.     nitroGLYCERIN 0.4 MG SL tablet  Commonly known as:  NITROSTAT  Place 1 tablet (0.4 mg total) under the tongue every 5 (five) minutes as needed for chest pain (up to 3 doses).     oxyCODONE 5 MG immediate release tablet  Commonly known as:  ROXICODONE  Take 1 tablet (5 mg total) by mouth every 6 (six) hours as needed.     pantoprazole 40 MG tablet  Commonly known as:  PROTONIX  Take 1 tablet (40 mg total) by mouth as needed.     potassium chloride SA 20 MEQ tablet  Commonly known as:  KLOR-CON M20  Take 1 tablet (20 mEq total) by mouth daily.     sotalol 80 MG tablet  Commonly known as:  BETAPACE  Take 1 tablet (80 mg total) by mouth every 12 (twelve) hours.  spironolactone 25 MG tablet  Commonly known as:  ALDACTONE  Take 25 mg by mouth daily.     warfarin 5 MG tablet  Commonly known as:  COUMADIN  Take 1 tablet (5 mg total) by mouth daily.        Roxicodone #30 No Refill  Disposition: home- No driving x 6 months  Patient's condition: is Good  Follow up: 1. Dr. Scot Dock in 2 weeks 2. Dr. Domenic Polite 07/07/14 @ 4:20pm 3. [place] [facility]R clinic in [place] on Tuesday at [place], [place] [facility] [phone] 06/30/2014  3:04 PM

## 2014-06-30 NOTE — Consult Note (Addendum)
WOC wound consult note Reason for Consult: Consult requested to apply Vac dressing to right leg fasciotomy site. Wound type: Full thickness post-op  Measurement:15X4.5X.2cm Wound bed: 90% beefy red 5% black, 5% yellow with exposed tendons Drainage (amount, consistency, odor) Mod amt yellow drainage, no odor Periwound: Edema surrounding Dressing procedure/placement/frequency: Applied Mepitel contact layer and one piece black sponge to 75 mm cont suction.  Pt tolerated with minimal discomfort.  Bedside nurse can change on Mon. Please re-consult if further assistance is needed.  Thank-you,  Julien Girt MSN, [place], [place], [place], [place], [place]

## 2014-06-30 NOTE — Progress Notes (Addendum)
  Progress Note  Agree with note below. Wound VAC to be placed today. Coumadin is therapeutic. As per Cardiology, continue Plavix and Coumadin, but no ASA. Home once VAC can be arranged at home.   Deitra Mayo, MD, FACS Beeper [phone] 10:07 AM    06/30/2014 8:02 AM 6 Days Post-Op  Subjective:  Ready to go home to his dogs  Afebrile HR 60's-100's 619'J-093'O systolic 67% RA  Filed Vitals:   06/30/14 0725  BP: 111/71  Pulse: 63  Temp:   Resp:     Physical Exam: Cardiac:  regular Lungs:  Non labored Incisions:  Right groin is c/d/i with some ecchymosis Extremities:  Right foot is warm and well perfused with a palpable right DP  CBC    Component Value Date/Time   WBC 7.4 06/30/2014 0420   RBC 4.12* 06/30/2014 0420   HGB 11.5* 06/30/2014 0420   HCT 33.3* 06/30/2014 0420   PLT 293 06/30/2014 0420   MCV 80.8 06/30/2014 0420   MCH 27.9 06/30/2014 0420   MCHC 34.5 06/30/2014 0420   RDW 16.6* 06/30/2014 0420   LYMPHSABS 0.9 06/24/2014 1000   MONOABS 1.1* 06/24/2014 1000   EOSABS 0.2 06/24/2014 1000   BASOSABS 0.0 06/24/2014 1000    BMET    Component Value Date/Time   NA 140 06/30/2014 0420   K 3.6* 06/30/2014 0420   CL 102 06/30/2014 0420   CO2 24 06/30/2014 0420   GLUCOSE 94 06/30/2014 0420   BUN 19 06/30/2014 0420   CREATININE 1.64* 06/30/2014 0420   CREATININE 1.34 07/21/2013 1212   CALCIUM 9.5 06/30/2014 0420   GFRNONAA 43* 06/30/2014 0420   GFRAA 50* 06/30/2014 0420    INR    Component Value Date/Time   INR 2.40* 06/30/2014 0420     Intake/Output Summary (Last 24 hours) at 06/30/14 0802 Last data filed at 06/29/14 1400  Gross per 24 hour  Intake    240 ml  Output    502 ml  Net   -262 ml     Assessment:  63 y.o. male is s/p:  right femoral embolectomy and 4 compartment fasciotomy  6 Days Post-Op  Plan: -pt doing well and ambulating  -pt has palpable right DP pulse -wound vac not placed yesterday--will order this  today -DVT prophylaxis:  INR is therapeutic at 2.4   Leontine Locket, PA-C [facility] [phone] 06/30/2014 8:02 AM

## 2014-06-30 NOTE — Progress Notes (Signed)
CARE MANAGEMENT NOTE 06/30/2014  Patient:  Adam House, Adam House   Account Number:  [id number]  Date Initiated:  06/26/2014  Documentation initiated by:  Marvetta Gibbons  Subjective/Objective Assessment:   Pt tx [place] with ischemic LE, now s/p  Right femoral embolectomy   angioplasty of the right common femoral artery, compartment fasciotomy     Action/Plan:   PTA pt lived at home with spouse- PT/OT evals ordered- anticipate return home with [facility]- will follow for recommendations   Anticipated DC Date:  06/30/2014   Anticipated DC Plan:  [place]  CM consult      [facility] Choice  HOME HEALTH   Choice offered to / List presented to:  C-1 Patient   DME arranged  VAC      DME agency  [employer]     [place] arranged  HH-1 RN      [place].   Status of service:  Completed, signed off Medicare Important Message given?  NO (If response is "NO", the following Medicare IM given date fields will be blank) Date Medicare IM given:   Medicare IM given by:   Date Additional Medicare IM given:   Additional Medicare IM given by:    Discharge Disposition:  [place]  Per UR Regulation:  Reviewed for med. necessity/level of care/duration of stay  If discussed at [place] of Stay Meetings, dates discussed:    Comments:  06/30/2014 1400 NCM spoke to pt and offered choice for [facility]. Pt agreeable to [facility] for [facility]. NCM contacted [employer] rep, Rickie Toye # [phone] for initiation of wound vac for dc home today. Waiting insurance auth on wound vac. Notified [facility] for possible dc home today or 10/17 with wound vac. Jonnie Finner RN CCM Case Mgmt phone [phone]

## 2014-06-30 NOTE — Progress Notes (Signed)
ANTICOAGULATION CONSULT NOTE - Follow Up Consult  Pharmacy Consult for Coumadin Indication: arterial embolism right leg; s/p embolectomy on 06/24/14  Allergies  Allergen Reactions  . Lisinopril Anaphylaxis and Swelling    angioedema  . Naproxen Hives    Patient Measurements: Height: 5' 9.5" (176.5 cm) Weight: 204 lb 2.3 oz (92.6 kg) IBW/kg (Calculated) : 71.85 Heparin Dosing Weight:   Vital Signs: Temp: 98.5 F (36.9 C) (10/16 1500) Temp Source: Oral (10/16 1500) BP: 110/69 mmHg (10/16 1500) Pulse Rate: 56 (10/16 1500)  Labs:  Recent Labs  06/28/14 0500 06/28/14 1230 06/28/14 2105 06/29/14 0305 06/29/14 0950 06/30/14 0420  HGB 10.0*  --   --  10.8*  --  11.5*  HCT 29.7*  --   --  31.6*  --  33.3*  PLT 236  --   --  257  --  293  LABPROT 16.2*  --   --  26.9* 29.2* 26.4*  INR 1.30  --   --  2.46* 2.74* 2.40*  HEPARINUNFRC 0.18* 0.22* 0.45 0.48  --   --   CREATININE  --   --   --  1.75*  --  1.64*    Estimated Creatinine Clearance: 52.3 ml/min (by C-G formula based on Cr of 1.64).   Medications:  Scheduled:  . allopurinol  100 mg Oral Daily  . cefUROXime (ZINACEF)  IV  1.5 g Intravenous 60 min Pre-Op  . clopidogrel  75 mg Oral Daily  . digoxin  0.125 mg Oral Daily  . docusate sodium  100 mg Oral Daily  . ezetimibe  10 mg Oral Daily  . furosemide  60 mg Oral BID  . isosorbide dinitrate  40 mg Oral TID  . losartan  25 mg Oral Daily  . metoprolol succinate  100 mg Oral Daily  . multivitamin with minerals  1 tablet Oral Daily  . omega-3 acid ethyl esters  2 g Oral Daily  . pantoprazole  40 mg Oral Daily  . potassium chloride SA  20 mEq Oral Daily  . sotalol  80 mg Oral Q12H  . spironolactone  25 mg Oral Daily  . warfarin  3 mg Oral ONCE-1800  . Warfarin - Pharmacist Dosing Inpatient   Does not apply q1800    Assessment: Pt doing well. Ready for discharge. Pt received coumadin 7.5 mg and 5 mg with a jump in his INR to 2.74 after 2 doses. Coumadin was  held yesterday. Will dose conservatively today and give him 3 mg. Will f/u INR in the AM. Goal of Therapy:  INR 2-3 Monitor platelets by anticoagulation protocol: Yes   Plan:  Coumadin 3 mg today. Daily INR.  Minta Balsam 06/30/2014,3:18 PM

## 2014-06-30 NOTE — Progress Notes (Signed)
06/30/2014 09:15 PM Progress Notes The patient was put on an EKG before the betapace medicine was given. It was sinus rhythm with 1st degree AV block. The QTc was 42.6.  Will continue to monitor. Lupita Dawn

## 2014-07-01 LAB — CBC
HCT: 33.5 % — ABNORMAL LOW (ref 39.0–52.0)
Hemoglobin: 11.4 g/dL — ABNORMAL LOW (ref 13.0–17.0)
MCH: 27.6 pg (ref 26.0–34.0)
MCHC: 34 g/dL (ref 30.0–36.0)
MCV: 81.1 fL (ref 78.0–100.0)
Platelets: 302 10*3/uL (ref 150–400)
RBC: 4.13 MIL/uL — ABNORMAL LOW (ref 4.22–5.81)
RDW: 16.6 % — ABNORMAL HIGH (ref 11.5–15.5)
WBC: 7.8 10*3/uL (ref 4.0–10.5)

## 2014-07-01 LAB — BASIC METABOLIC PANEL
ANION GAP: 14 (ref 5–15)
BUN: 23 mg/dL (ref 6–23)
CALCIUM: 9.2 mg/dL (ref 8.4–10.5)
CO2: 25 meq/L (ref 19–32)
CREATININE: 2.07 mg/dL — AB (ref 0.50–1.35)
Chloride: 99 mEq/L (ref 96–112)
GFR calc Af Amer: 38 mL/min — ABNORMAL LOW (ref >90)
GFR calc non Af Amer: 32 mL/min — ABNORMAL LOW (ref >90)
Glucose, Bld: 86 mg/dL (ref 70–99)
Potassium: 3.6 mEq/L — ABNORMAL LOW (ref 3.7–5.3)
Sodium: 138 mEq/L (ref 137–147)

## 2014-07-01 LAB — PROTIME-INR
INR: 2.42 — AB (ref 0.00–1.49)
Prothrombin Time: 26.6 seconds — ABNORMAL HIGH (ref 11.6–15.2)

## 2014-07-01 LAB — T4, FREE: Free T4: 1.18 ng/dL (ref 0.80–1.80)

## 2014-07-01 LAB — MAGNESIUM: Magnesium: 2 mg/dL (ref 1.5–2.5)

## 2014-07-01 NOTE — Progress Notes (Signed)
07/01/2014 12:59 AM Progress Notes Two hours after Betapace was given to the patient, his second EKG was done.  It showed sinus rhythm with 1st degree AV block.  The QT/QTc was 448/458 ms. Will continue to monitor patient. Lupita Dawn

## 2014-07-01 NOTE — Progress Notes (Signed)
    Subjective  - POD #7  No complaints this morning Ready to go home   Physical Exam:  Incisions are clean with staples intact Palpable dorsalis pedis pulse Wound VAC functioning and in place on fasciotomy       Assessment/Plan:  POD #7  We'll DC home today with follow up with Dr. Scot Dock in a few weeks  Adam House IV, V. WELLS 07/01/2014 11:06 AM --  Filed Vitals:   07/01/14 0917  BP: 102/61  Pulse: 60  Temp: 97.4 F (36.3 C)  Resp: 18    Intake/Output Summary (Last 24 hours) at 07/01/14 1106 Last data filed at 07/01/14 0955  Gross per 24 hour  Intake    240 ml  Output    300 ml  Net    -60 ml     Laboratory CBC    Component Value Date/Time   WBC 7.8 07/01/2014 0350   HGB 11.4* 07/01/2014 0350   HCT 33.5* 07/01/2014 0350   PLT 302 07/01/2014 0350    BMET    Component Value Date/Time   NA 138 07/01/2014 0350   K 3.6* 07/01/2014 0350   CL 99 07/01/2014 0350   CO2 25 07/01/2014 0350   GLUCOSE 86 07/01/2014 0350   BUN 23 07/01/2014 0350   CREATININE 2.07* 07/01/2014 0350   CREATININE 1.34 07/21/2013 1212   CALCIUM 9.2 07/01/2014 0350   GFRNONAA 32* 07/01/2014 0350   GFRAA 38* 07/01/2014 0350    COAG Lab Results  Component Value Date   INR 2.42* 07/01/2014   INR 2.40* 06/30/2014   INR 2.74* 06/29/2014   No results found for this basename: PTT    Antibiotics Anti-infectives   Start     Dose/Rate Route Frequency Ordered Stop   06/29/14 0600  cefUROXime (ZINACEF) 1.5 g in dextrose 5 % 50 mL IVPB     1.5 g 100 mL/hr over 30 Minutes Intravenous 60 min pre-op 06/28/14 1502     06/29/14 0000  ceFAZolin (ANCEF) IVPB 1 g/50 mL premix  Status:  Discontinued    Comments:  Send with pt to OR   1 g 100 mL/hr over 30 Minutes Intravenous On call 06/28/14 0642 06/28/14 1149   06/29/14 0000  ceFAZolin (ANCEF) IVPB 2 g/50 mL premix  Status:  Discontinued    Comments:  Send with pt to OR   2 g 100 mL/hr over 30 Minutes Intravenous On call 06/28/14  1149 06/28/14 1502   06/28/14 1419  cefUROXime (ZINACEF) 1.5 g in dextrose 5 % 50 mL IVPB  Status:  Discontinued     1.5 g 100 mL/hr over 30 Minutes Intravenous 60 min pre-op 06/28/14 1419 06/28/14 1502   06/24/14 1800  cefUROXime (ZINACEF) 1.5 g in dextrose 5 % 50 mL IVPB     1.5 g 100 mL/hr over 30 Minutes Intravenous Every 12 hours 06/24/14 1554 06/25/14 0514       V. Leia Alf, M.D. [facility] [place] Office: [phone] Pager:  [phone]

## 2014-07-01 NOTE — Progress Notes (Signed)
ELECTROPHYSIOLOGY ROUNDING NOTE    Patient Name: Adam House Date of Encounter: 07/01/2014    SUBJECTIVE:Patient feels well this morning.  No chest pain, shortness of breath, dizziness.   TELEMETRY: Reviewed telemetry pt in sinus rhythm with significantly decreased ventricular ectopy Filed Vitals:   06/30/14 1354 06/30/14 1500 06/30/14 2200 07/01/14 0355  BP: 111/64 110/69 106/62 100/62  Pulse: 84 56 69 54  Temp:  98.5 F (36.9 C) 98.1 F (36.7 C) 97.8 F (36.6 C)  TempSrc: Oral Oral Oral Oral  Resp: 19 16 18 19   Height:      Weight:      SpO2: 91% 100% 95% 99%    Intake/Output Summary (Last 24 hours) at 07/01/14 0707 Last data filed at 06/30/14 2218  Gross per 24 hour  Intake    240 ml  Output    300 ml  Net    -60 ml    CURRENT MEDICATIONS: . allopurinol  100 mg Oral Daily  . cefUROXime (ZINACEF)  IV  1.5 g Intravenous 60 min Pre-Op  . clopidogrel  75 mg Oral Daily  . digoxin  0.125 mg Oral Daily  . docusate sodium  100 mg Oral Daily  . ezetimibe  10 mg Oral Daily  . furosemide  60 mg Oral BID  . isosorbide dinitrate  40 mg Oral TID  . losartan  25 mg Oral Daily  . metoprolol succinate  100 mg Oral Daily  . multivitamin with minerals  1 tablet Oral Daily  . omega-3 acid ethyl esters  2 g Oral Daily  . pantoprazole  40 mg Oral Daily  . potassium chloride SA  20 mEq Oral Daily  . sotalol  80 mg Oral Q12H  . spironolactone  25 mg Oral Daily  . Warfarin - Pharmacist Dosing Inpatient   Does not apply q1800    LABS: Basic Metabolic Panel:  Recent Labs  06/30/14 0420 07/01/14 0350  NA 140 138  K 3.6* 3.6*  CL 102 99  CO2 24 25  GLUCOSE 94 86  BUN 19 23  CREATININE 1.64* 2.07*  CALCIUM 9.5 9.2  MG 1.6 2.0   Liver Function Tests:  Recent Labs  06/29/14 0244  AST 56*  ALT 15  ALKPHOS 74  BILITOT 0.6  PROT 7.1  ALBUMIN 3.1*   CBC:  Recent Labs  06/30/14 0420 07/01/14 0350  WBC 7.4 7.8  HGB 11.5* 11.4*  HCT 33.3* 33.5*  MCV  80.8 81.1  PLT 293 302   Thyroid Function Tests:  Recent Labs  06/30/14 0400  TSH 6.650*    PHYSICAL EXAM stable appearing NAD HEENT: Unremarkable,, AT Neck:  6 JVD, no thyromegally Back:  No CVA tenderness Lungs:  Clear with no wheezes, rales, or rhonchi HEART:  Regular rate rhythm, no murmurs, no rubs, no clicks Abd:  soft, positive bowel sounds, no organomegally, no rebound, no guarding Ext:  2 plus pulses, no edema, no cyanosis, no clubbing, right lower leg with wound vac Skin:  No rashes no nodules Neuro:  CN II through XII intact, motor grossly intact    Active Problems:   Ischemia of lower extremity    1. VT  No further sustained VT on Sotalol and increased dose of Metoprolol.  QTc stable.  Continue sotalol 80mg  BID and follow QTc.   Keep K >3.9 and Mg .1.9  TSH 6.650--> T4 pending  2. CAD/ ischemic CM  No ischemic symptoms/ euvolemic  Increase metoprolol as above  On  plavix   3. Acute R leg thrombosis  Possibly secondary to embolic event from LV. He did not have recent afib by ICD interrogation.  INR is now therapeutic    Follow up scheduled with [facility], Dr Bronson Ing, and Dr Rayann Heman.  No driving X6 months (pt aware)   EP Attending  Patient seen and examined. [place] for discharge from arrhythmia perspective. Please send him home on Sotalol 80 mg twice daily.  Mikle Bosworth.D.

## 2014-07-01 NOTE — Progress Notes (Signed)
Pt discharging home with wife, home health arranged and follow up appts in place.  VAC changed to personal KCI device, battery fully charged.  All prescriptions and instructions given and reviewed, all questions answered.

## 2014-07-03 ENCOUNTER — Telehealth: Payer: Self-pay | Admitting: Vascular Surgery

## 2014-07-03 NOTE — Telephone Encounter (Signed)
Phone call from [place], [place].  Reported pt. was admitted to [facility] today.  Noted upon wound vac change, that the distal wound bed had "a grape-size black clot present".  Stated she flushed the wound bed with Saline; reported a very small piece of the clot broke off, and the rest of the clot remained intact, in the wound bed.  Denied any sign of bleeding from site.  Also reported the remainder of the wound bed is "beefy red and shallow."  The Wound Vac was reapplied and the suction was set at 100 mm of negative pressure, per orders.  Stated pt. was advised to report any increased bleeding or drainage.  Dr. Trula Slade made aware of the above, and agreed; no new orders at this time.  Verdis Frederickson, [facility] RN was made aware that no new orders per Dr. Trula Slade.  Will notify office if any worsening bleeding/ drainage.

## 2014-07-03 NOTE — Telephone Encounter (Signed)
Message copied by Gena Fray on Mon Jul 03, 2014  4:09 PM ------      Message from: Mena Goes      Created: Fri Jun 30, 2014  4:20 PM      Regarding: Schedule                   ----- Message -----         From: Gabriel Earing, PA-C         Sent: 06/30/2014   2:46 PM           To: [facility] Charge Pool            S/p right femoral embolectomy and 4 compartment fasciotomy.  F/u in 2 weeks.            Thanks,      Aldona Bar ------

## 2014-07-04 ENCOUNTER — Telehealth: Payer: Self-pay | Admitting: *Deleted

## 2014-07-04 ENCOUNTER — Ambulatory Visit (INDEPENDENT_AMBULATORY_CARE_PROVIDER_SITE_OTHER): Payer: BC Managed Care – PPO | Admitting: *Deleted

## 2014-07-04 ENCOUNTER — Telehealth: Payer: Self-pay | Admitting: Cardiovascular Disease

## 2014-07-04 DIAGNOSIS — I824Y9 Acute embolism and thrombosis of unspecified deep veins of unspecified proximal lower extremity: Secondary | ICD-10-CM | POA: Insufficient documentation

## 2014-07-04 DIAGNOSIS — I824Y1 Acute embolism and thrombosis of unspecified deep veins of right proximal lower extremity: Secondary | ICD-10-CM

## 2014-07-04 LAB — POCT INR: INR: 1.2

## 2014-07-04 NOTE — Telephone Encounter (Signed)
Patient is in a lot of pain and would like to know if he can come another day

## 2014-07-04 NOTE — Telephone Encounter (Signed)
Home health nurse called to report issues with patient and issues with medications. Patient can't read or write and is very sensitive to this issue. Per home health nurse, patient is very embarrassed by this issue and wanted to make office aware. Please contact wife with coumadin instructions or send the directions to her. Patient doesn't have betapace or zetia at his home per nurse and these are listed on his d/c medication profile. Also per home health nurse, patient did have hydralazine and aspirin at his home and may have been taking these medications. These two medications were not listed on his d/c list.

## 2014-07-04 NOTE — Telephone Encounter (Signed)
Pt needs to come today

## 2014-07-04 NOTE — Telephone Encounter (Signed)
Called and left message on home machine to bring all medication bottles to the coumadin visit today.

## 2014-07-05 ENCOUNTER — Other Ambulatory Visit (HOSPITAL_COMMUNITY): Payer: BC Managed Care – PPO

## 2014-07-07 ENCOUNTER — Encounter: Payer: BC Managed Care – PPO | Admitting: Cardiovascular Disease

## 2014-07-07 ENCOUNTER — Inpatient Hospital Stay (HOSPITAL_COMMUNITY)
Admission: EM | Admit: 2014-07-07 | Discharge: 2014-07-11 | DRG: 309 | Disposition: A | Discharge status: Home / Self Care | Payer: BC Managed Care – PPO | Attending: Internal Medicine | Admitting: Internal Medicine

## 2014-07-07 ENCOUNTER — Inpatient Hospital Stay (HOSPITAL_COMMUNITY)
Admission: RE | Admit: 2014-07-07 | Payer: BC Managed Care – PPO | Source: Ambulatory Visit | Admitting: Thoracic Surgery (Cardiothoracic Vascular Surgery)

## 2014-07-07 ENCOUNTER — Emergency Department (HOSPITAL_COMMUNITY): Payer: BC Managed Care – PPO

## 2014-07-07 ENCOUNTER — Encounter (HOSPITAL_COMMUNITY): Payer: Self-pay | Admitting: Emergency Medicine

## 2014-07-07 ENCOUNTER — Ambulatory Visit (INDEPENDENT_AMBULATORY_CARE_PROVIDER_SITE_OTHER): Payer: BC Managed Care – PPO | Admitting: Pharmacist

## 2014-07-07 ENCOUNTER — Encounter (HOSPITAL_COMMUNITY): Admission: RE | Payer: Self-pay | Source: Ambulatory Visit

## 2014-07-07 DIAGNOSIS — I129 Hypertensive chronic kidney disease with stage 1 through stage 4 chronic kidney disease, or unspecified chronic kidney disease: Secondary | ICD-10-CM | POA: Diagnosis present

## 2014-07-07 DIAGNOSIS — Z87891 Personal history of nicotine dependence: Secondary | ICD-10-CM

## 2014-07-07 DIAGNOSIS — E780 Pure hypercholesterolemia: Secondary | ICD-10-CM | POA: Diagnosis present

## 2014-07-07 DIAGNOSIS — F419 Anxiety disorder, unspecified: Secondary | ICD-10-CM | POA: Diagnosis present

## 2014-07-07 DIAGNOSIS — N184 Chronic kidney disease, stage 4 (severe): Secondary | ICD-10-CM

## 2014-07-07 DIAGNOSIS — Z95810 Presence of automatic (implantable) cardiac defibrillator: Secondary | ICD-10-CM | POA: Diagnosis not present

## 2014-07-07 DIAGNOSIS — I5022 Chronic systolic (congestive) heart failure: Secondary | ICD-10-CM | POA: Diagnosis present

## 2014-07-07 DIAGNOSIS — I739 Peripheral vascular disease, unspecified: Secondary | ICD-10-CM | POA: Diagnosis present

## 2014-07-07 DIAGNOSIS — I509 Heart failure, unspecified: Secondary | ICD-10-CM

## 2014-07-07 DIAGNOSIS — I472 Ventricular tachycardia, unspecified: Secondary | ICD-10-CM

## 2014-07-07 DIAGNOSIS — I2510 Atherosclerotic heart disease of native coronary artery without angina pectoris: Secondary | ICD-10-CM | POA: Diagnosis present

## 2014-07-07 DIAGNOSIS — N189 Chronic kidney disease, unspecified: Secondary | ICD-10-CM | POA: Diagnosis present

## 2014-07-07 DIAGNOSIS — I252 Old myocardial infarction: Secondary | ICD-10-CM

## 2014-07-07 DIAGNOSIS — Z7901 Long term (current) use of anticoagulants: Secondary | ICD-10-CM

## 2014-07-07 DIAGNOSIS — Z888 Allergy status to other drugs, medicaments and biological substances status: Secondary | ICD-10-CM

## 2014-07-07 DIAGNOSIS — I255 Ischemic cardiomyopathy: Secondary | ICD-10-CM | POA: Diagnosis present

## 2014-07-07 DIAGNOSIS — M109 Gout, unspecified: Secondary | ICD-10-CM | POA: Diagnosis present

## 2014-07-07 DIAGNOSIS — J849 Interstitial pulmonary disease, unspecified: Secondary | ICD-10-CM | POA: Diagnosis present

## 2014-07-07 DIAGNOSIS — K219 Gastro-esophageal reflux disease without esophagitis: Secondary | ICD-10-CM | POA: Diagnosis present

## 2014-07-07 DIAGNOSIS — R079 Chest pain, unspecified: Secondary | ICD-10-CM

## 2014-07-07 DIAGNOSIS — Z7982 Long term (current) use of aspirin: Secondary | ICD-10-CM | POA: Diagnosis not present

## 2014-07-07 DIAGNOSIS — Z4502 Encounter for adjustment and management of automatic implantable cardiac defibrillator: Secondary | ICD-10-CM

## 2014-07-07 DIAGNOSIS — I998 Other disorder of circulatory system: Secondary | ICD-10-CM

## 2014-07-07 DIAGNOSIS — Z8673 Personal history of transient ischemic attack (TIA), and cerebral infarction without residual deficits: Secondary | ICD-10-CM | POA: Diagnosis not present

## 2014-07-07 DIAGNOSIS — I5043 Acute on chronic combined systolic (congestive) and diastolic (congestive) heart failure: Secondary | ICD-10-CM

## 2014-07-07 DIAGNOSIS — I824Y1 Acute embolism and thrombosis of unspecified deep veins of right proximal lower extremity: Secondary | ICD-10-CM

## 2014-07-07 DIAGNOSIS — I10 Essential (primary) hypertension: Secondary | ICD-10-CM

## 2014-07-07 DIAGNOSIS — N179 Acute kidney failure, unspecified: Secondary | ICD-10-CM

## 2014-07-07 LAB — CBC
HEMATOCRIT: 36.6 % — AB (ref 39.0–52.0)
HEMOGLOBIN: 13 g/dL (ref 13.0–17.0)
MCH: 28.1 pg (ref 26.0–34.0)
MCHC: 35.5 g/dL (ref 30.0–36.0)
MCV: 79.2 fL (ref 78.0–100.0)
Platelets: 443 10*3/uL — ABNORMAL HIGH (ref 150–400)
RBC: 4.62 MIL/uL (ref 4.22–5.81)
RDW: 15.9 % — ABNORMAL HIGH (ref 11.5–15.5)
WBC: 11.5 10*3/uL — AB (ref 4.0–10.5)

## 2014-07-07 LAB — BASIC METABOLIC PANEL
Anion gap: 17 — ABNORMAL HIGH (ref 5–15)
BUN: 23 mg/dL (ref 6–23)
CHLORIDE: 94 meq/L — AB (ref 96–112)
CO2: 20 meq/L (ref 19–32)
Calcium: 9.2 mg/dL (ref 8.4–10.5)
Creatinine, Ser: 1.4 mg/dL — ABNORMAL HIGH (ref 0.50–1.35)
GFR calc Af Amer: 60 mL/min — ABNORMAL LOW (ref >90)
GFR calc non Af Amer: 52 mL/min — ABNORMAL LOW (ref >90)
GLUCOSE: 105 mg/dL — AB (ref 70–99)
Potassium: 4.1 mEq/L (ref 3.7–5.3)
SODIUM: 131 meq/L — AB (ref 137–147)

## 2014-07-07 LAB — I-STAT TROPONIN, ED: TROPONIN I, POC: 0.1 ng/mL — AB (ref 0.00–0.08)

## 2014-07-07 LAB — POCT INR: INR: 2.5

## 2014-07-07 LAB — PROTIME-INR
INR: 1.98 — AB (ref 0.00–1.49)
PROTHROMBIN TIME: 22.7 s — AB (ref 11.6–15.2)

## 2014-07-07 LAB — DIGOXIN LEVEL: DIGOXIN LVL: 0.4 ng/mL — AB (ref 0.8–2.0)

## 2014-07-07 SURGERY — VIDEO ASSISTED THORACOSCOPY
Anesthesia: General | Site: Chest

## 2014-07-07 MED ORDER — ALLOPURINOL 100 MG PO TABS
100.0000 mg | ORAL_TABLET | Freq: Every day | ORAL | Status: DC
  Administered 2014-07-08 – 2014-07-11 (×4): 100 mg via ORAL
  Filled 2014-07-07 (×4): qty 1

## 2014-07-07 MED ORDER — AMIODARONE IV BOLUS ONLY 150 MG/100ML: 150.0000 mg | Freq: Once | INTRAVENOUS | Status: DC

## 2014-07-07 MED ORDER — METOPROLOL SUCCINATE ER 100 MG PO TB24
100.0000 mg | ORAL_TABLET | Freq: Every day | ORAL | Status: DC
  Administered 2014-07-08 – 2014-07-09 (×2): 100 mg via ORAL
  Filled 2014-07-07 (×2): qty 1

## 2014-07-07 MED ORDER — OXYCODONE HCL 5 MG PO TABS
5.0000 mg | ORAL_TABLET | Freq: Four times a day (QID) | ORAL | Status: DC | PRN
  Administered 2014-07-08 – 2014-07-10 (×5): 5 mg via ORAL
  Filled 2014-07-07 (×4): qty 1

## 2014-07-07 MED ORDER — AMIODARONE HCL IN DEXTROSE 360-4.14 MG/200ML-% IV SOLN
30.0000 mg/h | INTRAVENOUS | Status: DC
  Filled 2014-07-07 (×2): qty 200

## 2014-07-07 MED ORDER — NITROGLYCERIN 0.4 MG SL SUBL: 0.4000 mg | SUBLINGUAL_TABLET | SUBLINGUAL | Status: DC | PRN

## 2014-07-07 MED ORDER — AMIODARONE HCL IN DEXTROSE 360-4.14 MG/200ML-% IV SOLN
60.0000 mg/h | INTRAVENOUS | Status: AC
  Administered 2014-07-07 – 2014-07-08 (×2): 60 mg/h via INTRAVENOUS
  Filled 2014-07-07: qty 200

## 2014-07-07 MED ORDER — ALBUTEROL SULFATE HFA 108 (90 BASE) MCG/ACT IN AERS: 2.0000 | INHALATION_SPRAY | Freq: Four times a day (QID) | RESPIRATORY_TRACT | Status: DC | PRN

## 2014-07-07 MED ORDER — AMIODARONE IV BOLUS ONLY 150 MG/100ML
150.0000 mg | Freq: Once | INTRAVENOUS | Status: DC
  Filled 2014-07-07: qty 100

## 2014-07-07 MED ORDER — SOTALOL HCL 80 MG PO TABS: 80.0000 mg | ORAL_TABLET | Freq: Two times a day (BID) | ORAL | Status: DC

## 2014-07-07 MED ORDER — WARFARIN SODIUM 5 MG PO TABS
5.0000 mg | ORAL_TABLET | Freq: Every day | ORAL | Status: DC
  Administered 2014-07-08 (×2): 5 mg via ORAL
  Filled 2014-07-07 (×3): qty 1

## 2014-07-07 MED ORDER — PANTOPRAZOLE SODIUM 40 MG PO TBEC
40.0000 mg | DELAYED_RELEASE_TABLET | Freq: Every day | ORAL | Status: DC
  Administered 2014-07-08 – 2014-07-11 (×4): 40 mg via ORAL
  Filled 2014-07-07 (×4): qty 1

## 2014-07-07 MED ORDER — ISOSORBIDE DINITRATE ER 40 MG PO CPCR
40.0000 mg | ORAL_CAPSULE | Freq: Three times a day (TID) | ORAL | Status: DC
  Administered 2014-07-08 – 2014-07-11 (×9): 40 mg via ORAL
  Filled 2014-07-07 (×13): qty 1

## 2014-07-07 MED ORDER — ADULT MULTIVITAMIN W/MINERALS CH
1.0000 | ORAL_TABLET | Freq: Every day | ORAL | Status: DC
  Administered 2014-07-09 – 2014-07-11 (×3): 1 via ORAL
  Filled 2014-07-07 (×4): qty 1

## 2014-07-07 MED ORDER — POTASSIUM CHLORIDE CRYS ER 20 MEQ PO TBCR
20.0000 meq | EXTENDED_RELEASE_TABLET | Freq: Every day | ORAL | Status: DC
  Administered 2014-07-08 – 2014-07-11 (×4): 20 meq via ORAL
  Filled 2014-07-07 (×5): qty 1

## 2014-07-07 MED ORDER — SPIRONOLACTONE 25 MG PO TABS
25.0000 mg | ORAL_TABLET | Freq: Every day | ORAL | Status: DC
  Administered 2014-07-08 – 2014-07-09 (×2): 25 mg via ORAL
  Filled 2014-07-07 (×2): qty 1

## 2014-07-07 MED ORDER — LOSARTAN POTASSIUM 25 MG PO TABS
25.0000 mg | ORAL_TABLET | Freq: Every day | ORAL | Status: DC
  Administered 2014-07-08 – 2014-07-09 (×2): 25 mg via ORAL
  Filled 2014-07-07 (×3): qty 1

## 2014-07-07 MED ORDER — EZETIMIBE 10 MG PO TABS
10.0000 mg | ORAL_TABLET | Freq: Every day | ORAL | Status: DC
  Administered 2014-07-08 – 2014-07-11 (×4): 10 mg via ORAL
  Filled 2014-07-07 (×4): qty 1

## 2014-07-07 MED ORDER — CLOPIDOGREL BISULFATE 75 MG PO TABS
75.0000 mg | ORAL_TABLET | Freq: Every day | ORAL | Status: DC
  Administered 2014-07-08 – 2014-07-09 (×2): 75 mg via ORAL
  Filled 2014-07-07 (×2): qty 1

## 2014-07-07 MED ORDER — DIGOXIN 125 MCG PO TABS
0.1250 mg | ORAL_TABLET | Freq: Every day | ORAL | Status: DC
  Administered 2014-07-08 – 2014-07-11 (×4): 0.125 mg via ORAL
  Filled 2014-07-07 (×4): qty 1

## 2014-07-07 MED ORDER — FENTANYL CITRATE 0.05 MG/ML IJ SOLN: 50.0000 ug | Freq: Once | INTRAMUSCULAR | Status: DC

## 2014-07-07 MED ORDER — FUROSEMIDE 40 MG PO TABS
60.0000 mg | ORAL_TABLET | Freq: Two times a day (BID) | ORAL | Status: DC
  Administered 2014-07-08 – 2014-07-09 (×5): 60 mg via ORAL
  Filled 2014-07-07: qty 3
  Filled 2014-07-07 (×8): qty 1

## 2014-07-07 MED ORDER — AMIODARONE HCL IN DEXTROSE 360-4.14 MG/200ML-% IV SOLN
60.0000 mg/h | Freq: Once | INTRAVENOUS | Status: AC
  Administered 2014-07-07: 60 mg/h via INTRAVENOUS
  Filled 2014-07-07: qty 200

## 2014-07-07 NOTE — H&P (Signed)
History and Physical   Patient ID: Adam House MRN: [id number], DOB/AGE: [birth year] 63 y.o. Date of Encounter: 07/07/2014  Primary Physician: Rosita Fire, MD Primary Cardiologist: Bronson Ing Primary EP: Allred  Chief Complaint:  ICD fired, VT  HPI: Adam House is a 63 y.o. male with a past medical history significant for CAD, ischemic cardiomyopathy, systolic heart failure, PVD, hypertension, prior CVA, and interstitial lung disease presents after his ICD fired today.  with a past medical history significant for CAD, ischemic cardiomyopathy, systolic heart failure, PVD, hypertension, prior CVA, and interstitial lung disease.  Post op course was complicated by recurrent episodes of VT and hypotension.  He was seen by EP and had his medications changed to include Sotalol 80 mg BID and metoprolol 100 mg.  He was at home today and had an episode of this device firing.  He went to the ED and again his device fired and on telemetry he had a what appears to be VT.  He was started on amiodarone with bolus and gtt.     Past Medical History  Diagnosis Date  . CAD (coronary artery disease)     a. ant MI 1998 with PCI to LAD. b. Ant-lat MI 04/2012 tx with overlapping DES to LAD.  [name] Systolic CHF     NYHA Class II/III  . Ischemic cardiomyopathy     EF 25-30% by echo 04/2012; [place]  . CRI (chronic renal insufficiency)   . Unspecified essential hypertension   . Impotence of organic origin   . Esophageal reflux   . Tobacco use disorder   . ICD (implantable cardiac defibrillator) in place   . Hypercholesteremia   . Heart murmur   . CVA (cerebral infarction) 11/2010  . Gout   . 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 Lung disease, interstitial     Surgical History:  Past Surgical History  Procedure Laterality Date  . Anterior cervical decomp/discectomy fusion  2007    "put in 4 screws to hold my head on; ruptured C5; job related injury" (08/24/2012)  .  Cardiac defibrillator placement  2011    [employer]  he is a MAD[facility] RIT study patient  . Coronary angioplasty with stent placement  05/1997; 04/2012    "1 + 2; total of 3" (08/24/2012)  . Embolectomy Right 06/24/2014    Procedure: RIGHT FEMORAL EMBOLECTOMY,  ;  Surgeon: Angelia Mould, MD;  Location: [facility] OR;  Service: Vascular;  Laterality: Right;  Right Femoral Embolectomy, with bovine patch angioplasty,.  . Fasciotomy Left 06/24/2014    Procedure: FASCIOTOMY;  Surgeon: Angelia Mould, MD;  Location: [place];  Service: Vascular;  Laterality: Left;  four compartment Fasciotomy.     I have reviewed the patient's current medications. Prior to Admission medications   Medication Sig Start Date End Date Taking? Authorizing Provider  albuterol (PROVENTIL HFA;VENTOLIN HFA) 108 (90 BASE) MCG/ACT inhaler Inhale 2 puffs into the lungs every 6 (six) hours as needed for wheezing or shortness of breath. For shortness of breath   Yes Historical Provider, MD  allopurinol (ZYLOPRIM) 100 MG tablet Take 100 mg by mouth daily.   Yes Historical Provider, MD  clopidogrel (PLAVIX) 75 MG tablet Take 75 mg by mouth daily.   Yes Historical Provider, MD  colchicine 0.6 MG tablet Take 1 tablet (0.6 mg total) by mouth daily as needed. For gout 04/27/12  Yes Rhonda G Barrett, PA-C  digoxin (LANOXIN) 0.125 MG tablet Take  0.125 mg by mouth daily.   Yes Historical Provider, MD  ezetimibe (ZETIA) 10 MG tablet Take 1 tablet (10 mg total) by mouth daily. 04/24/14  Yes Herminio Commons, MD  fish oil-omega-3 fatty acids 1000 MG capsule Take 2 g by mouth daily.   Yes Historical Provider, MD  furosemide (LASIX) 40 MG tablet Take 1.5 tablets (60 mg total) by mouth 2 (two) times daily. 06/30/14  Yes Samantha J Rhyne, PA-C  isosorbide dinitrate (DILATRATE-SR) 40 MG CR capsule Take 1 capsule (40 mg total) by mouth 3 (three) times daily. 10/06/12  Yes Larey Dresser, MD  losartan (COZAAR) 25 MG tablet Take 1 tablet (25 mg  total) by mouth daily. 11/23/12  Yes Larey Dresser, MD  metoprolol succinate (TOPROL XL) 100 MG 24 hr tablet Take 1 tablet (100 mg total) by mouth daily. Take with or immediately following a meal. 06/30/14  Yes Samantha J Rhyne, PA-C  multivitamin (ONE-A-DAY MEN'S) TABS Take 1 tablet by mouth daily.   Yes Historical Provider, MD  oxyCODONE (ROXICODONE) 5 MG immediate release tablet Take 1 tablet (5 mg total) by mouth every 6 (six) hours as needed. 06/30/14  Yes Samantha J Rhyne, PA-C  pantoprazole (PROTONIX) 40 MG tablet Take 1 tablet (40 mg total) by mouth as needed. 08/19/13  Yes Herminio Commons, MD  potassium chloride SA (KLOR-CON M20) 20 MEQ tablet Take 1 tablet (20 mEq total) by mouth daily. 07/07/13  Yes Thompson Grayer, MD  sotalol (BETAPACE) 80 MG tablet Take 1 tablet (80 mg total) by mouth every 12 (twelve) hours. 06/30/14  Yes Samantha J Rhyne, PA-C  spironolactone (ALDACTONE) 25 MG tablet Take 25 mg by mouth daily.   Yes Historical Provider, MD  warfarin (COUMADIN) 5 MG tablet Take 1 tablet (5 mg total) by mouth daily. 06/30/14  Yes Samantha J Rhyne, PA-C  nitroGLYCERIN (NITROSTAT) 0.4 MG SL tablet Place 1 tablet (0.4 mg total) under the tongue every 5 (five) minutes as needed for chest pain (up to 3 doses). 08/27/12   [name] N Dunn, PA-C   Scheduled Meds: . [START ON 07/08/2014] allopurinol  100 mg Oral Daily  . [START ON 07/08/2014] clopidogrel  75 mg Oral Daily  . [START ON 07/08/2014] digoxin  0.125 mg Oral Daily  . [START ON 07/08/2014] ezetimibe  10 mg Oral Daily  . fentaNYL  50 mcg Intravenous Once  . furosemide  60 mg Oral BID  . isosorbide dinitrate  40 mg Oral TID  . [START ON 07/08/2014] losartan  25 mg Oral Daily  . [START ON 07/08/2014] metoprolol succinate  100 mg Oral Daily  . [START ON 07/08/2014] multivitamin  1 tablet Oral Daily  . [START ON 07/08/2014] pantoprazole  40 mg Oral Daily  . [START ON 07/08/2014] potassium chloride SA  20 mEq Oral Daily  . sotalol  80  mg Oral Q12H  . [START ON 07/08/2014] spironolactone  25 mg Oral Daily  . [START ON 07/08/2014] warfarin  5 mg Oral Daily   Continuous Infusions: . amiodarone     Followed by  . [START ON 07/08/2014] amiodarone     PRN Meds:.albuterol, nitroGLYCERIN, oxyCODONE  Allergies:  Allergies  Allergen Reactions  . Lisinopril Anaphylaxis and Swelling    angioedema  . Naproxen Hives    History   Social History  . Marital Status: Married    Spouse Name: N/A    Number of Children: N/A  . Years of Education: N/A   Occupational History  .  RETIRED    Social History Main Topics  . Smoking status: Former Smoker -- 0.50 packs/day for 40 years    Types: Cigarettes    Quit date: 02/13/2014  . Smokeless tobacco: Current User    Types: Snuff  . Alcohol Use: No     Comment: 08/24/2012 "used to drink; not anymore; stopped > 41yrs ago; never had any problems w/it"  . Drug Use: Yes    Special: Marijuana     Comment: 08/24/2012 "last drug use >4-5 months ago"  . Sexual Activity: Yes   Other Topics Concern  . Not on file   Social History Narrative   Last updated: 03/15/2010   Retired and lives in [place].  Previously worked for [employer].    Tobacco Use - 1PPD x 40+ years,  has quit x 3 days   ETOH- rare   Drugs- occasional marijuana           Family History  Problem Relation Age of Onset  . Diabetes Mother   . Heart disease Father    Family Status  Relation Status Death Age  . Father Deceased     MI  . Mother Deceased     MI  . Brother Deceased     MI    Review of Systems:   Full 14-point review of systems otherwise negative except as noted above.  Physical Exam: Blood pressure 126/74, pulse 80, temperature 98.4 F (36.9 C), temperature source Oral, resp. rate 29, SpO2 100.00%. General: Well developed, well nourished,male in no acute distress. Head: Normocephalic, atraumatic, sclera non-icteric, no xanthomas, nares are without discharge. Dentition:  Neck: No  carotid bruits. JVD not elevated. No thyromegally Lungs: Good expansion bilaterally. without wheezes or rhonchi.  Heart: IRRegular rate and rhythm with S1 S2.  No S3 or S4.  No murmur, no rubs, or gallops appreciated. Abdomen: Soft, non-tender, non-distended with normoactive bowel sounds. No hepatomegaly. No rebound/guarding. No obvious abdominal masses. Msk:  Strength and tone appear normal for age. No joint deformities or effusions, no spine or costo-vertebral angle tenderness. Extremities: No clubbing or cyanosis. No edema.  Distal pedal pulses are 2+ in 4 extrem Neuro: Alert and oriented X 3. Moves all extremities spontaneously. No focal deficits noted. Psych:  Responds to questions appropriately with a normal affect. Skin: No rashes or lesions noted  Labs:   Lab Results  Component Value Date   WBC 11.5* 07/07/2014   HGB 13.0 07/07/2014   HCT 36.6* 07/07/2014   MCV 79.2 07/07/2014   PLT 443* 07/07/2014    Recent Labs  07/07/14 1907  INR 1.98*    Recent Labs Lab 07/07/14 1907  NA 131*  K 4.1  CL 94*  CO2 20  BUN 23  CREATININE 1.40*  CALCIUM 9.2  GLUCOSE 105*   No results found for this basename: CKTOTAL, CKMB, TROPONINI,  in the last 72 hours  Recent Labs  07/07/14 1929  TROPIPOC 0.10*   Lab Results  Component Value Date   CHOL 148 08/25/2012   HDL 23* 08/25/2012   LDLCALC 103* 08/25/2012   TRIG 112 08/25/2012   No results found for this basename: probnp   No results found for this basename: DDIMER    Radiology/Studies: Dg Chest Portable 1 View  07/07/2014   CLINICAL DATA:  Defibrillator malfunction. Shocked twice today. History of hypertension. Prior smoker.  EXAM: PORTABLE CHEST - 1 VIEW  COMPARISON:  01/10/2014  FINDINGS: Left AICD in place, unchanged. Cardiomegaly. Chronic interstitial prominence  in the lower lungs, likely chronic interstitial lung disease/ fibrosis. No effusions. No edema or acute airspace opacity.  IMPRESSION: Cardiomegaly.  Stable  chronic bibasilar fibrosis   Electronically Signed   By: Rolm Baptise M.D.   On: 07/07/2014 19:52     ASSESSMENT AND PLAN:  Active Problems:   VT (ventricular tachycardia)  1) VT - continue amiodarone gtt, will also continue home sotalol and metoprolol, may need increased doses of sotalol as amio is not a long term solution for him.  2) Ischemic heart disease - continue home medications including isosorbide dinitrate, BB, plavix, zetia  3) CHF - ischemic, chronic - continue ARB, long acting BB, lasix and spironolactone  4) Ischemic peripheral arterial disease - continue home anticoagulation and wound management  5) Anxiety/fatigue - xanax PRN  FEN - NPO for now, on coumadin will not need DVT prophy,   Signed, Violeta Gelinas, MD 07/07/2014 11:43 PM Beeper [phone]

## 2014-07-07 NOTE — ED Notes (Signed)
Pt to ED via [facility]- pt reports his defibrillator fired prior to arrival while he was sitting.  Pt denies CP or shortness of breath.  Pt speaking in full sentences no distress noted at present.  Pt sinus rhythm on monitor at present.

## 2014-07-07 NOTE — ED Notes (Signed)
Family x3 at [facility], no changes, VSS.

## 2014-07-07 NOTE — ED Notes (Addendum)
Placed on O2 [place] 4L, amiodarone bolus and gtt started, reports some sob, (denies: pain, dizziness or other sx), alert, NAD, calm, interactive, ST 106 on monitor, VSS. Family x2 at [facility]. Xray at [facility]. Denies any ASA today, has been taken off ASA, takes coumadin, has not had coumadin today.

## 2014-07-07 NOTE — ED Notes (Signed)
Alert, NAD, calm, interactive, no dyspnea noted, family at [facility] x3.

## 2014-07-07 NOTE — Progress Notes (Signed)
  Amiodarone Drug - Drug Interaction Consult Note  Recommendations: THIS PT HAS MULTIPLE SIGNIFICANT INTERACTIONS WITH AMIODARONE AND SHOULD BE MONITORED CLOSELY.  HOME MED SOTALOL SHOULD NOT BE GIVEN W/ AMIO.  INR WILL NEED TO BE MONITORED CLOSELY, AS WELL AS POTASSIUM.   Amiodarone is metabolized by the cytochrome P450 system and therefore has the potential to cause many drug interactions. Amiodarone has an average plasma half-life of 50 days (range 20 to 100 days).   There is potential for drug interactions to occur several weeks or months after stopping treatment and the onset of drug interactions may be slow after initiating amiodarone.   []  Statins: Increased risk of myopathy. Simvastatin- restrict dose to 20mg  daily. Other statins: counsel patients to report any muscle pain or weakness immediately.  [x]  Anticoagulants: Amiodarone can increase anticoagulant effect. Consider warfarin dose reduction. Patients should be monitored closely and the dose of anticoagulant altered accordingly, remembering that amiodarone levels take several weeks to stabilize.  []  Antiepileptics: Amiodarone can increase plasma concentration of phenytoin, the dose should be reduced. Note that small changes in phenytoin dose can result in large changes in levels. Monitor patient and counsel on signs of toxicity.  [x]  Beta blockers: increased risk of bradycardia, AV block and myocardial depression. Sotalol - avoid concomitant use.  []   Calcium channel blockers (diltiazem and verapamil): increased risk of bradycardia, AV block and myocardial depression.  []   Cyclosporine: Amiodarone increases levels of cyclosporine. Reduced dose of cyclosporine is recommended.  []  Digoxin dose should be halved when amiodarone is started.  [x]  Diuretics: increased risk of cardiotoxicity if hypokalemia occurs.  []  Oral hypoglycemic agents (glyburide, glipizide, glimepiride): increased risk of hypoglycemia. Patient's glucose levels  should be monitored closely when initiating amiodarone therapy.   [x]  Drugs that prolong the QT interval:  Torsades de pointes risk may be increased with concurrent use - avoid if possible.  Monitor QTc, also keep magnesium/potassium WNL if concurrent therapy can't be avoided. [name] Antibiotics: e.g. fluoroquinolones, erythromycin. . Antiarrhythmics: e.g. quinidine, procainamide, disopyramide, sotalol. . Antipsychotics: e.g. phenothiazines, haloperidol.  . Lithium, tricyclic antidepressants, and methadone. Thank You,  Wynona Neat, PharmD, BCPS  07/07/2014 11:42 PM

## 2014-07-07 NOTE — ED Notes (Signed)
Pt noted to be Vtach on the monitor with a rate in the 200's.  By the time RN went into room, pt stated he had felt his AICD go off.  HR in the 90's.

## 2014-07-07 NOTE — ED Notes (Signed)
Elevated I-stat Troponin reported to Dr. Mingo Amber

## 2014-07-07 NOTE — ED Notes (Signed)
Ice water given to visitors. They declined straws.

## 2014-07-07 NOTE — ED Notes (Signed)
No changes, VSS/improved, alert, NAD, calm, interactive.

## 2014-07-07 NOTE — ED Notes (Signed)
Myself and Chastity, NT undressed pt, placed in gown, on monitor, continuous pulse oximetry and blood pressure cuff

## 2014-07-07 NOTE — ED Provider Notes (Signed)
CSN: [id number]     Arrival date & time 07/07/14  1832 History   First MD Initiated Contact with Patient 07/07/14 1844     Chief Complaint  Patient presents with  . AICD Problem     (Consider location/radiation/quality/duration/timing/severity/associated sxs/prior Treatment) Patient is a 63 y.o. male presenting with general illness.  Illness Location:  Home Quality:  AICD fire Severity:  Severe Onset quality:  Sudden Duration: seconds. Timing:  Sporadic Progression:  Unchanged Chronicity:  New Context:  Was resting, no CP, SOB, N/V - then AICD fired Relieved by:  Nothing Worsened by:  Nothing Associated symptoms: no abdominal pain, no chest pain, no cough, no fever, no shortness of breath and no vomiting     Past Medical History  Diagnosis Date  . CAD (coronary artery disease)     a. ant MI 1998 with PCI to LAD. b. Ant-lat MI 04/2012 tx with overlapping DES to LAD.  [name] Systolic CHF     NYHA Class II/III  . Ischemic cardiomyopathy     EF 25-30% by echo 04/2012; [place]  . CRI (chronic renal insufficiency)   . Unspecified essential hypertension   . Impotence of organic origin   . Esophageal reflux   . Tobacco use disorder   . ICD (implantable cardiac defibrillator) in place   . Hypercholesteremia   . Heart murmur   . CVA (cerebral infarction) 11/2010  . Gout   . 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 Lung disease, interstitial    Past Surgical History  Procedure Laterality Date  . Anterior cervical decomp/discectomy fusion  2007    "put in 4 screws to hold my head on; ruptured C5; job related injury" (08/24/2012)  . Cardiac defibrillator placement  2011    [employer]  he is a MADIT RIT study patient  . Coronary angioplasty with stent placement  05/1997; 04/2012    "1 + 2; total of 3" (08/24/2012)  . Embolectomy Right 06/24/2014    Procedure: RIGHT FEMORAL EMBOLECTOMY,  ;  Surgeon: Angelia Mould, MD;  [facility] OR;  Service: Vascular;   Laterality: Right;  Right Femoral Embolectomy, with bovine patch angioplasty,.  . Fasciotomy Left 06/24/2014    Procedure: FASCIOTOMY;  Surgeon: Angelia Mould, MD;  Location: [place];  Service: Vascular;  Laterality: Left;  four compartment Fasciotomy.   Family History  Problem Relation Age of Onset  . Diabetes Mother   . Heart disease Father    History  Substance Use Topics  . Smoking status: Former Smoker -- 0.50 packs/day for 40 years    Types: Cigarettes    Quit date: 02/13/2014  . Smokeless tobacco: Current User    Types: Snuff  . Alcohol Use: No     Comment: 08/24/2012 "used to drink; not anymore; stopped > 38yrs ago; never had any problems w/it"    Review of Systems  Constitutional: Negative for fever and chills.  Respiratory: Negative for cough and shortness of breath.   Cardiovascular: Negative for chest pain and leg swelling.  Gastrointestinal: Negative for vomiting and abdominal pain.  All other systems reviewed and are negative.     Allergies  Lisinopril and Naproxen  Home Medications   Prior to Admission medications   Medication Sig Start Date End Date Taking? Authorizing Provider  albuterol (PROVENTIL HFA;VENTOLIN HFA) 108 (90 BASE) MCG/ACT inhaler Inhale 2 puffs into the lungs every 6 (six) hours as needed for wheezing or shortness of breath. For shortness of  breath   Yes Historical Provider, MD  allopurinol (ZYLOPRIM) 100 MG tablet Take 100 mg by mouth daily.   Yes Historical Provider, MD  clopidogrel (PLAVIX) 75 MG tablet Take 75 mg by mouth daily.   Yes Historical Provider, MD  colchicine 0.6 MG tablet Take 1 tablet (0.6 mg total) by mouth daily as needed. For gout 04/27/12  Yes Rhonda G Barrett, PA-C  digoxin (LANOXIN) 0.125 MG tablet Take 0.125 mg by mouth daily.   Yes Historical Provider, MD  ezetimibe (ZETIA) 10 MG tablet Take 1 tablet (10 mg total) by mouth daily. 04/24/14  Yes Herminio Commons, MD  fish oil-omega-3 fatty acids 1000 MG capsule  Take 2 g by mouth daily.   Yes Historical Provider, MD  furosemide (LASIX) 40 MG tablet Take 1.5 tablets (60 mg total) by mouth 2 (two) times daily. 06/30/14  Yes Samantha J Rhyne, PA-C  isosorbide dinitrate (DILATRATE-SR) 40 MG CR capsule Take 1 capsule (40 mg total) by mouth 3 (three) times daily. 10/06/12  Yes Larey Dresser, MD  losartan (COZAAR) 25 MG tablet Take 1 tablet (25 mg total) by mouth daily. 11/23/12  Yes Larey Dresser, MD  metoprolol succinate (TOPROL XL) 100 MG 24 hr tablet Take 1 tablet (100 mg total) by mouth daily. Take with or immediately following a meal. 06/30/14  Yes Samantha J Rhyne, PA-C  multivitamin (ONE-A-DAY MEN'S) TABS Take 1 tablet by mouth daily.   Yes Historical Provider, MD  oxyCODONE (ROXICODONE) 5 MG immediate release tablet Take 1 tablet (5 mg total) by mouth every 6 (six) hours as needed. 06/30/14  Yes Samantha J Rhyne, PA-C  pantoprazole (PROTONIX) 40 MG tablet Take 1 tablet (40 mg total) by mouth as needed. 08/19/13  Yes Herminio Commons, MD  potassium chloride SA (KLOR-CON M20) 20 MEQ tablet Take 1 tablet (20 mEq total) by mouth daily. 07/07/13  Yes Thompson Grayer, MD  sotalol (BETAPACE) 80 MG tablet Take 1 tablet (80 mg total) by mouth every 12 (twelve) hours. 06/30/14  Yes Samantha J Rhyne, PA-C  spironolactone (ALDACTONE) 25 MG tablet Take 25 mg by mouth daily.   Yes Historical Provider, MD  warfarin (COUMADIN) 5 MG tablet Take 1 tablet (5 mg total) by mouth daily. 06/30/14  Yes Samantha J Rhyne, PA-C  nitroGLYCERIN (NITROSTAT) 0.4 MG SL tablet Place 1 tablet (0.4 mg total) under the tongue every 5 (five) minutes as needed for chest pain (up to 3 doses). 08/27/12   [name] N Dunn, PA-C   BP 135/71  Pulse 77  Temp(Src) 98.4 F (36.9 C) (Oral)  Resp 16  SpO2 95% Physical Exam  Nursing note and vitals reviewed. Constitutional: He is oriented to person, place, and time. He appears well-developed and well-nourished. No distress.  HENT:  Head:  Normocephalic and atraumatic.  Mouth/Throat: Oropharynx is clear and moist. No oropharyngeal exudate.  Eyes: EOM are normal. Pupils are equal, round, and reactive to light.  Neck: Normal range of motion. Neck supple.  Cardiovascular: Normal rate and regular rhythm.  Exam reveals no friction rub.   No murmur heard. Pulmonary/Chest: Effort normal and breath sounds normal. No respiratory distress. He has no wheezes. He has no rales.  Abdominal: Soft. He exhibits no distension. There is no tenderness. There is no rebound.  Musculoskeletal: Normal range of motion. He exhibits no edema.  Neurological: He is alert and oriented to person, place, and time.  Skin: No rash noted. He is not diaphoretic.    ED Course  Procedures (including critical care time) Labs Review Labs Reviewed  [place], ED    Imaging Review No results found.   EKG Interpretation   Date/Time:  Friday July 07 2014 18:38:56 EDT Ventricular Rate:  95 PR Interval:  207 QRS Duration: 88 QT Interval:  318 QTC Calculation: 400 R Axis:   -119 Text Interpretation:  Sinus rhythm Borderline prolonged PR interval Left  atrial enlargement Inferior infarct, old Anterolateral infarct, age  indeterminate T wave morphologies similar to prior Confirmed by [facility]   MD, [place] [phone]) on 07/07/2014 6:47:26 PM     CRITICAL CARE Performed by: Osvaldo Shipper   Total critical care time: 30 minutes  Critical care time was exclusive of separately billable procedures and treating other patients.  Critical care was necessary to treat or prevent imminent or life-threatening deterioration.  Critical care was time spent personally by me on the following activities: development of treatment plan with patient and/or surrogate as well as nursing, discussions with consultants, evaluation of patient's response to treatment, examination of patient, obtaining history from patient or  surrogate, ordering and performing treatments and interventions, ordering and review of laboratory studies, ordering and review of radiographic studies, pulse oximetry and re-evaluation of patient's condition.  MDM   Final diagnoses:  Chest pain  AICD discharge    34M presents with AICD firing. Happened twice today, once prior to arrival and then once here, just prior to me examining him. Vtach captured on the monitor prior to his shock. EKG with some diffuse ST elevation, all seen on prior EKGs. No active CP, no SOB, no N/V, was feeling well until his AICD fired. Recently here for blood clot in R leg, seen by EP while on Vascular Surgery service - had sotalol BID added. Will consult Cards for his 2 episodes of AICD firing. Amiodarone bolus and drip started for his Vtach. Admitted by Cards.    Evelina Bucy, MD 07/07/14 [phone]

## 2014-07-08 DIAGNOSIS — I2510 Atherosclerotic heart disease of native coronary artery without angina pectoris: Secondary | ICD-10-CM

## 2014-07-08 DIAGNOSIS — I472 Ventricular tachycardia: Principal | ICD-10-CM

## 2014-07-08 DIAGNOSIS — N184 Chronic kidney disease, stage 4 (severe): Secondary | ICD-10-CM

## 2014-07-08 DIAGNOSIS — I255 Ischemic cardiomyopathy: Secondary | ICD-10-CM

## 2014-07-08 DIAGNOSIS — Z95810 Presence of automatic (implantable) cardiac defibrillator: Secondary | ICD-10-CM

## 2014-07-08 LAB — CBC
HCT: 34 % — ABNORMAL LOW (ref 39.0–52.0)
Hemoglobin: 11.9 g/dL — ABNORMAL LOW (ref 13.0–17.0)
MCH: 27.7 pg (ref 26.0–34.0)
MCHC: 35 g/dL (ref 30.0–36.0)
MCV: 79.3 fL (ref 78.0–100.0)
PLATELETS: 425 10*3/uL — AB (ref 150–400)
RBC: 4.29 MIL/uL (ref 4.22–5.81)
RDW: 15.8 % — ABNORMAL HIGH (ref 11.5–15.5)
WBC: 9.7 10*3/uL (ref 4.0–10.5)

## 2014-07-08 LAB — BASIC METABOLIC PANEL
ANION GAP: 16 — AB (ref 5–15)
BUN: 21 mg/dL (ref 6–23)
CALCIUM: 9.1 mg/dL (ref 8.4–10.5)
CHLORIDE: 99 meq/L (ref 96–112)
CO2: 19 mEq/L (ref 19–32)
Creatinine, Ser: 1.39 mg/dL — ABNORMAL HIGH (ref 0.50–1.35)
GFR calc non Af Amer: 52 mL/min — ABNORMAL LOW (ref >90)
GFR, EST AFRICAN AMERICAN: 61 mL/min — AB (ref >90)
Glucose, Bld: 110 mg/dL — ABNORMAL HIGH (ref 70–99)
Potassium: 4.2 mEq/L (ref 3.7–5.3)
SODIUM: 134 meq/L — AB (ref 137–147)

## 2014-07-08 LAB — PROTIME-INR
INR: 2.09 — ABNORMAL HIGH (ref 0.00–1.49)
Prothrombin Time: 23.7 seconds — ABNORMAL HIGH (ref 11.6–15.2)

## 2014-07-08 MED ORDER — ONDANSETRON HCL 4 MG/2ML IJ SOLN
4.0000 mg | Freq: Four times a day (QID) | INTRAMUSCULAR | Status: DC | PRN
Start: 2014-07-08 — End: 2014-07-11
  Administered 2014-07-09: 4 mg via INTRAVENOUS
  Filled 2014-07-08: qty 2

## 2014-07-08 MED ORDER — ALBUTEROL SULFATE (2.5 MG/3ML) 0.083% IN NEBU: 3.0000 mL | INHALATION_SOLUTION | Freq: Four times a day (QID) | RESPIRATORY_TRACT | Status: DC | PRN

## 2014-07-08 MED ORDER — WARFARIN - PHYSICIAN DOSING INPATIENT
Freq: Every day | Status: DC
  Administered 2014-07-08: 18:00:00

## 2014-07-08 MED ORDER — ACETAMINOPHEN 325 MG PO TABS
650.0000 mg | ORAL_TABLET | ORAL | Status: DC | PRN
  Administered 2014-07-08: 650 mg via ORAL
  Filled 2014-07-08: qty 2

## 2014-07-08 MED ORDER — ALPRAZOLAM 0.5 MG PO TABS: 0.5000 mg | ORAL_TABLET | Freq: Every evening | ORAL | Status: DC | PRN

## 2014-07-08 MED ORDER — SOTALOL HCL 80 MG PO TABS
160.0000 mg | ORAL_TABLET | Freq: Two times a day (BID) | ORAL | Status: DC
Start: 2014-07-09 — End: 2014-07-09
  Administered 2014-07-08: 80 mg via ORAL
  Administered 2014-07-09: 160 mg via ORAL
  Filled 2014-07-08 (×2): qty 2

## 2014-07-08 MED ORDER — SOTALOL HCL 80 MG PO TABS
160.0000 mg | ORAL_TABLET | Freq: Two times a day (BID) | ORAL | Status: DC
  Administered 2014-07-08: 160 mg via ORAL
  Filled 2014-07-08 (×2): qty 2

## 2014-07-08 MED ORDER — NITROGLYCERIN 0.4 MG SL SUBL: 0.4000 mg | SUBLINGUAL_TABLET | SUBLINGUAL | Status: DC | PRN

## 2014-07-08 NOTE — Progress Notes (Signed)
PT Cancellation Note  Patient Details Name: Adam House MRN: [id number] DOB: 1951-09-04   Cancelled Treatment:    Reason Eval/Treat Not Completed: Medical issues which prohibited therapy.  Pt experiencing gout in his left foot and unable to participate with PT today. 07/08/2014  [name], [place] [phone]  (pager)   [name], [name] 07/08/2014, 4:44 PM

## 2014-07-08 NOTE — Consult Note (Signed)
ELECTROPHYSIOLOGY CONSULT NOTE    Patient ID: Adam House MRN: [id number], DOB/AGE: 63-Sep-1952 63 y.o.  Admit date: 07/07/2014 Date of Consult: 07/08/2014  Primary Physician: Rosita Fire, MD Primary Cardiologist: Bronson Ing Electrophysiologist: Zareya Tuckett  Reason for Consultation: VT  HPI:  Adam House is a 63 y.o. male with a past medical history significant for CAD, ischemic cardiomyopathy, systolic heart failure, PVD, hypertension, prior CVA, and interstitial lung disease (followed by Dr Chase Caller pending VATS with Roxan Hockey).   He was recently admitted on 10-10 - 07/03/14 with acute thromboembolism of right foot and underwent thrombectomy and fasciotomy at that time. He developed ventricular tachycardia that was predominately pace terminated.  Sotalol was added and he did well without recurrent arrhythmias. He was discharged on Sotalol 80mg  twice daily and Metoprolol 100mg  daily.   On the day of admission, he had an ICD shock and came to the ER for evaluation.  He had recurrent VT in the ER that failed to terminate with ATP but terminated with HV shock.  He was admitted for further evaluation.  IV Amiodarone has been started in addition to his sotalol.   He currently denies chest pain, shortness of breath, fever, chills. He does have incisional pain and has also had recent loose stools. He has not had dizziness or pre-syncope. ROS is negative except as outlined above.   Echo 06-27-14 demonstrated EF 20-25%, diffuse hypokinesis, akinesis of apical myocardium, LA 51.   EP has been asked to evaluate for treatment options   Past Medical History  Diagnosis Date  . CAD (coronary artery disease)     a. ant MI 1998 with PCI to LAD. b. Ant-lat MI 04/2012 tx with overlapping DES to LAD.  [name] Systolic CHF     NYHA Class II/III  . Ischemic cardiomyopathy     EF 25-30% by echo 04/2012; [place]  . CRI (chronic renal insufficiency)   . Unspecified essential  hypertension   . Impotence of organic origin   . Esophageal reflux   . Tobacco use disorder   . ICD (implantable cardiac defibrillator) in place   . Hypercholesteremia   . Heart murmur   . CVA (cerebral infarction) 11/2010  . Gout   . 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 Lung disease, interstitial      Surgical History:  Past Surgical History  Procedure Laterality Date  . Anterior cervical decomp/discectomy fusion  2007    "put in 4 screws to hold my head on; ruptured C5; job related injury" (08/24/2012)  . Cardiac defibrillator placement  2011    [employer]  he is a MADIT RIT study patient  . Coronary angioplasty with stent placement  05/1997; 04/2012    "1 + 2; total of 3" (08/24/2012)  . Embolectomy Right 06/24/2014    Procedure: RIGHT FEMORAL EMBOLECTOMY,  ;  Surgeon: Angelia Mould, MD;  Location: [facility] OR;  Service: Vascular;  Laterality: Right;  Right Femoral Embolectomy, with bovine patch angioplasty,.  . Fasciotomy Left 06/24/2014    Procedure: FASCIOTOMY;  Surgeon: Angelia Mould, MD;  Location: [place];  Service: Vascular;  Laterality: Left;  four compartment Fasciotomy.     Prescriptions prior to admission  Medication Sig Dispense Refill  . albuterol (PROVENTIL HFA;VENTOLIN HFA) 108 (90 BASE) MCG/ACT inhaler Inhale 2 puffs into the lungs every 6 (six) hours as needed for wheezing or shortness of breath. For shortness of breath      . allopurinol (ZYLOPRIM) 100 MG tablet  Take 100 mg by mouth daily.      . clopidogrel (PLAVIX) 75 MG tablet Take 75 mg by mouth daily.      . colchicine 0.6 MG tablet Take 1 tablet (0.6 mg total) by mouth daily as needed. For gout  30 tablet  11  . digoxin (LANOXIN) 0.125 MG tablet Take 0.125 mg by mouth daily.      [name] ezetimibe (ZETIA) 10 MG tablet Take 1 tablet (10 mg total) by mouth daily.  28 tablet  0  . fish oil-omega-3 fatty acids 1000 MG capsule Take 2 g by mouth daily.      . furosemide (LASIX) 40 MG tablet Take 1.5  tablets (60 mg total) by mouth 2 (two) times daily.  90 tablet  0  . isosorbide dinitrate (DILATRATE-SR) 40 MG CR capsule Take 1 capsule (40 mg total) by mouth 3 (three) times daily.  90 capsule  6  . losartan (COZAAR) 25 MG tablet Take 1 tablet (25 mg total) by mouth daily.  30 tablet  6  . metoprolol succinate (TOPROL XL) 100 MG 24 hr tablet Take 1 tablet (100 mg total) by mouth daily. Take with or immediately following a meal.  30 tablet  0  . multivitamin (ONE-A-DAY MEN'S) TABS Take 1 tablet by mouth daily.      [name] oxyCODONE (ROXICODONE) 5 MG immediate release tablet Take 1 tablet (5 mg total) by mouth every 6 (six) hours as needed.  30 tablet  0  . pantoprazole (PROTONIX) 40 MG tablet Take 1 tablet (40 mg total) by mouth as needed.  30 tablet  6  . potassium chloride SA (KLOR-CON M20) 20 MEQ tablet Take 1 tablet (20 mEq total) by mouth daily.  30 tablet  6  . sotalol (BETAPACE) 80 MG tablet Take 1 tablet (80 mg total) by mouth every 12 (twelve) hours.  60 tablet  1  . spironolactone (ALDACTONE) 25 MG tablet Take 25 mg by mouth daily.      [name] warfarin (COUMADIN) 5 MG tablet Take 1 tablet (5 mg total) by mouth daily.  30 tablet  1  . nitroGLYCERIN (NITROSTAT) 0.4 MG SL tablet Place 1 tablet (0.4 mg total) under the tongue every 5 (five) minutes as needed for chest pain (up to 3 doses).        Inpatient Medications:  . allopurinol  100 mg Oral Daily  . clopidogrel  75 mg Oral Daily  . digoxin  0.125 mg Oral Daily  . ezetimibe  10 mg Oral Daily  . fentaNYL  50 mcg Intravenous Once  . furosemide  60 mg Oral BID  . isosorbide dinitrate  40 mg Oral TID  . losartan  25 mg Oral Daily  . metoprolol succinate  100 mg Oral Daily  . multivitamin with minerals  1 tablet Oral Daily  . pantoprazole  40 mg Oral Daily  . potassium chloride SA  20 mEq Oral Daily  . sotalol  80 mg Oral Q12H  . spironolactone  25 mg Oral Daily  . warfarin  5 mg Oral q1800  . Warfarin - Physician Dosing Inpatient   Does  not apply q1800    Allergies:  Allergies  Allergen Reactions  . Lisinopril Anaphylaxis and Swelling    angioedema  . Naproxen Hives    History   Social History  . Marital Status: Married    Spouse Name: N/A    Number of Children: N/A  . Years of Education: N/A  Occupational History  . RETIRED    Social History Main Topics  . Smoking status: Former Smoker -- 0.50 packs/day for 40 years    Types: Cigarettes    Quit date: 02/13/2014  . Smokeless tobacco: Current User    Types: Snuff  . Alcohol Use: No     Comment: 08/24/2012 "used to drink; not anymore; stopped > 33yrs ago; never had any problems w/it"  . Drug Use: Yes    Special: Marijuana     Comment: 08/24/2012 "last drug use >4-5 months ago"  . Sexual Activity: Yes   Other Topics Concern  . Not on file   Social History Narrative   Last updated: 03/15/2010   Retired and lives in [place].  Previously worked for [employer].    Tobacco Use - 1PPD x 40+ years,  has quit x 3 days   ETOH- rare   Drugs- occasional marijuana            Family History  Problem Relation Age of Onset  . Diabetes Mother   . Heart disease Father     BP 118/60  Pulse 74  Temp(Src) 98.9 F (37.2 C) (Oral)  Resp 28  Ht 5\' 9"  (1.753 m)  Wt 189 lb 9.5 oz (86 kg)  BMI 27.99 kg/m2  SpO2 100%  Physical Exam: Filed Vitals:   07/08/14 0040 07/08/14 0050 07/08/14 0124 07/08/14 0525  BP: 143/75 141/72 124/63 118/60  Pulse: 78 80 77 74  Temp:   98 F (36.7 C) 98.9 F (37.2 C)  TempSrc:   Oral Oral  Resp: 26 28 20 28   Height:   5\' 9"  (1.753 m)   Weight:   189 lb 9.5 oz (86 kg)   SpO2: 100% 100% 100% 100%    GEN- The patient is ill appearing, alert and oriented x 3 today.   Head- normocephalic, atraumatic Eyes-  Sclera clear, conjunctiva pink Ears- hearing intact Oropharynx- clear Neck- supple, Lungs- Clear to ausculation bilaterally, normal work of breathing Heart- Regular rate and rhythm  GI- soft, NT, ND, +  BS Extremities- no clubbing, cyanosis, or edema, R leg dressing is in place, 2+ DP/PT pulses bilaterally,  Small area of warmth tenderness over L midfoot laterally MS- no significant deformity or atrophy Skin- no rash or lesion Psych- euthymic mood, full affect Neuro- strength and sensation are intact    Labs:   Lab Results  Component Value Date   WBC 9.7 07/08/2014   HGB 11.9* 07/08/2014   HCT 34.0* 07/08/2014   MCV 79.3 07/08/2014   PLT 425* 07/08/2014    Recent Labs Lab 07/08/14 0246  NA 134*  K 4.2  CL 99  CO2 19  BUN 21  CREATININE 1.39*  CALCIUM 9.1  GLUCOSE 110*     Radiology/Studies:  Dg Chest Portable 1 View 07/07/2014   CLINICAL DATA:  Defibrillator malfunction. Shocked twice today. History of hypertension. Prior smoker.  EXAM: PORTABLE CHEST - 1 VIEW  COMPARISON:  01/10/2014  FINDINGS: Left AICD in place, unchanged. Cardiomegaly. Chronic interstitial prominence in the lower lungs, likely chronic interstitial lung disease/ fibrosis. No effusions. No edema or acute airspace opacity.  IMPRESSION: Cardiomegaly.  Stable chronic bibasilar fibrosis   Electronically Signed   By: Rolm Baptise M.D.   On: 07/07/2014 19:52   MLJ:QGBEE rhythm, 1st degree AV block, QTc 439  TELEMETRY: sinus rhythm, no further ventricular arrhythmias since ER yesterday.   DEVICE HISTORY: BSX Teligen dual chamber ICD implanted 2011 by Dr  Allissa Albright.  Device interrogation today  Demonstrated 3 episodes of VT (3pm, 5pm, 7pm).  3pm was atp terminated.  During 5 pm and 7pm events he exhausted ATP and then had a successful 41 J shock .  VT CL was 285 msec.  Device function is otherwise normal.  A/P 1. VT The patient has had recurrent VT is the setting of recent illness.  Given interstitial lung disease, I worry about risks of amiodarone long term.  I would therefore like to try optimization of sotalol first.  He has tolerated sotalol 80mg  BID without QT prolongation.  I will increase to 160mg  BID today  and follow closely over the weekend.  Stop IV amiodarone. Continue metoprolol.  He has a severe medically issue which I am actively treating with high medical risks of sudden death and progressive CHF.  He will require close inpatient monitoring over the weekend.  2. Ischemic CM/ acute on chronic systolic dysfunction The patient appears mildly volume overloaded on exam.  We will continue aggressive medical therapy and monitor closely.  3. CAD No ischemic symptoms Continue current medicines  4. PVD with recent thrombectomy/ fasciotomy I have spoken with Dr Bridgett Larsson with [facility] this am and he will help [place] address and manage the patients fasciotomy site while here.  The patient has good pulses today.  He has an area over the lateral aspect of his L midfoot which is warm and tender.  The patient attributes this to gout.  I will follow closely while here.  5. Chronic renal failure Slightly improved Will need to follow closely in the setting of increased sotalol.  He will remain in the hospital for arrhythmia management over the weekend.

## 2014-07-08 NOTE — Progress Notes (Signed)
Adam House has been increasingly hypotensive today after increasing sotalol to 160mg .  All other medications are unchanged from his home doses.  He is afebrile and WBC is wnl.  Will hold his PM dose of isordil and give sotalol 90mg  instead of 180mg .

## 2014-07-08 NOTE — Progress Notes (Signed)
   Daily Progress Note  Assessment/Planning: POD #13 s/p R fem embolectomy w/ BPA   R lateral fasciotomy looks cleaner: Continue wet-to-dry BID to lateral fasciotomy  Dr. Scot Dock will swing by on Monday if still in hospital  Subjective    "heart misbehaving"  Objective Filed Vitals:   07/08/14 0050 07/08/14 0124 07/08/14 0525 07/08/14 0846  BP: 141/72 124/63 118/60 121/68  Pulse: 80 77 74 80  Temp:  98 F (36.7 C) 98.9 F (37.2 C)   TempSrc:  Oral Oral   Resp: 28 20 28    Height:  5\' 9"  (1.753 m)    Weight:  189 lb 9.5 oz (86 kg)    SpO2: 100% 100% 100%     Intake/Output Summary (Last 24 hours) at 07/08/14 0907 Last data filed at 07/08/14 0519  Gross per 24 hour  Intake    200 ml  Output   1250 ml  Net  -1050 ml    VASC  Medial fasciotomy incision c/d/i, staples in placed; lateral fasciotomy incision: +granulation, mild amount of fibrinous exudate in mid-segment, +new blood clot  Laboratory CBC    Component Value Date/Time   WBC 9.7 07/08/2014 0246   HGB 11.9* 07/08/2014 0246   HCT 34.0* 07/08/2014 0246   PLT 425* 07/08/2014 0246    BMET    Component Value Date/Time   NA 134* 07/08/2014 0246   K 4.2 07/08/2014 0246   CL 99 07/08/2014 0246   CO2 19 07/08/2014 0246   GLUCOSE 110* 07/08/2014 0246   BUN 21 07/08/2014 0246   CREATININE 1.39* 07/08/2014 0246   CREATININE 1.34 07/21/2013 1212   CALCIUM 9.1 07/08/2014 0246   GFRNONAA 52* 07/08/2014 0246   GFRAA 61* 07/08/2014 0246    Adele Barthel, MD [facility]: [phone] Pager: [phone]  07/08/2014, 9:07 AM

## 2014-07-09 ENCOUNTER — Other Ambulatory Visit: Payer: Self-pay

## 2014-07-09 DIAGNOSIS — I10 Essential (primary) hypertension: Secondary | ICD-10-CM

## 2014-07-09 DIAGNOSIS — I5043 Acute on chronic combined systolic (congestive) and diastolic (congestive) heart failure: Secondary | ICD-10-CM

## 2014-07-09 DIAGNOSIS — Z0389 Encounter for observation for other suspected diseases and conditions ruled out: Secondary | ICD-10-CM

## 2014-07-09 LAB — BASIC METABOLIC PANEL
ANION GAP: 15 (ref 5–15)
BUN: 34 mg/dL — AB (ref 6–23)
CALCIUM: 9.2 mg/dL (ref 8.4–10.5)
CO2: 21 meq/L (ref 19–32)
CREATININE: 1.97 mg/dL — AB (ref 0.50–1.35)
Chloride: 95 mEq/L — ABNORMAL LOW (ref 96–112)
GFR calc Af Amer: 40 mL/min — ABNORMAL LOW (ref >90)
GFR calc non Af Amer: 34 mL/min — ABNORMAL LOW (ref >90)
GLUCOSE: 99 mg/dL (ref 70–99)
Potassium: 4.6 mEq/L (ref 3.7–5.3)
Sodium: 131 mEq/L — ABNORMAL LOW (ref 137–147)

## 2014-07-09 LAB — PROTIME-INR
INR: 3.35 — AB (ref 0.00–1.49)
Prothrombin Time: 34.2 seconds — ABNORMAL HIGH (ref 11.6–15.2)

## 2014-07-09 LAB — MAGNESIUM: Magnesium: 2 mg/dL (ref 1.5–2.5)

## 2014-07-09 MED ORDER — SOTALOL HCL 80 MG PO TABS
160.0000 mg | ORAL_TABLET | Freq: Two times a day (BID) | ORAL | Status: DC
  Administered 2014-07-09 – 2014-07-11 (×4): 160 mg via ORAL
  Filled 2014-07-09 (×5): qty 2

## 2014-07-09 MED ORDER — METOPROLOL SUCCINATE ER 100 MG PO TB24
100.0000 mg | ORAL_TABLET | Freq: Every day | ORAL | Status: DC
  Administered 2014-07-10 – 2014-07-11 (×2): 100 mg via ORAL
  Filled 2014-07-09 (×2): qty 1

## 2014-07-09 MED ORDER — ALPRAZOLAM 0.5 MG PO TABS
0.5000 mg | ORAL_TABLET | Freq: Three times a day (TID) | ORAL | Status: DC | PRN
  Administered 2014-07-09 – 2014-07-10 (×3): 0.5 mg via ORAL
  Filled 2014-07-09 (×3): qty 1

## 2014-07-09 MED ORDER — SPIRONOLACTONE 25 MG PO TABS
25.0000 mg | ORAL_TABLET | Freq: Every day | ORAL | Status: DC
  Filled 2014-07-09: qty 1

## 2014-07-09 NOTE — Progress Notes (Signed)
SUBJECTIVE: The patient is doing ok today.  He has not been able to participate in PT due to bilateral leg pain.  He has pain over his L foot which he attributes to gout.  At this time, he denies chest pain, shortness of breath, or any new concerns.  [name] allopurinol  100 mg Oral Daily  . digoxin  0.125 mg Oral Daily  . ezetimibe  10 mg Oral Daily  . fentaNYL  50 mcg Intravenous Once  . furosemide  60 mg Oral BID  . isosorbide dinitrate  40 mg Oral TID  . losartan  25 mg Oral Daily  . [START ON 07/10/2014] metoprolol succinate  100 mg Oral Daily  . multivitamin with minerals  1 tablet Oral Daily  . pantoprazole  40 mg Oral Daily  . potassium chloride SA  20 mEq Oral Daily  . sotalol  160 mg Oral Q12H  . [START ON 07/10/2014] spironolactone  25 mg Oral Daily  . Warfarin - Physician Dosing Inpatient   Does not apply q1800      OBJECTIVE: Physical Exam: Filed Vitals:   07/08/14 2200 07/08/14 2325 07/09/14 0421 07/09/14 1057  BP: 89/46 92/43 112/70 100/56  Pulse: 60 63 63 60  Temp:   98.4 F (36.9 C)   TempSrc:   Oral   Resp:   18   Height:      Weight:   185 lb 14.4 oz (84.324 kg)   SpO2:   100%     Intake/Output Summary (Last 24 hours) at 07/09/14 1305 Last data filed at 07/09/14 1200  Gross per 24 hour  Intake    600 ml  Output   1075 ml  Net   -475 ml    Telemetry reveals sinus rhythm  GEN- The patient is chronically ill appearing, alert and oriented x 3 today.   Head- normocephalic, atraumatic Eyes-  Sclera clear, conjunctiva pink Ears- hearing intact Oropharynx- clear Neck- supple  Lungs- Clear to ausculation bilaterally, normal work of breathing Heart- Regular rate and rhythm  GI- soft, NT, ND, + BS Extremities- no clubbing, cyanosis, or edema Skin- R leg dressing in place, there is an area of warmth/redness/ ttp over the L lateral midfoot Psych- euthymic mood, full affect Neuro- strength and sensation are intact  LABS: Basic Metabolic Panel:  Recent  Labs  07/08/14 0246 07/09/14 0445  NA 134* 131*  K 4.2 4.6  CL 99 95*  CO2 19 21  GLUCOSE 110* 99  BUN 21 34*  CREATININE 1.39* 1.97*  CALCIUM 9.1 9.2  MG  --  2.0   Liver Function Tests: No results found for this basename: AST, ALT, ALKPHOS, BILITOT, PROT, ALBUMIN,  in the last 72 hours No results found for this basename: LIPASE, AMYLASE,  in the last 72 hours CBC:  Recent Labs  07/07/14 1907 07/08/14 0246  WBC 11.5* 9.7  HGB 13.0 11.9*  HCT 36.6* 34.0*  MCV 79.2 79.3  PLT 443* 425*    RADIOLOGY: Ct Head Wo Contrast  06/25/2014   CLINICAL DATA:  Acute onset left-sided weakness  EXAM: CT HEAD WITHOUT CONTRAST  TECHNIQUE: Contiguous axial images were obtained from the base of the skull through the vertex without intravenous contrast.  COMPARISON:  November 23, 2010  FINDINGS: The ventricles are normal in size and configuration. There is no mass, hemorrhage, extra-axial fluid collection, or midline shift. There is rather minimal periventricular small vessel disease in the centra semiovale bilaterally. Elsewhere gray-white compartments appear normal.  There is no demonstrable acute infarct. The middle cerebral arteries do not show appreciable increased attenuation on either side. The bony calvarium appears intact. The mastoid air cells are clear. There is mild mucosal thickening in several ethmoid air cells bilaterally.  IMPRESSION: Mild mucosal thickening in several ethmoid air cells bilaterally. No intracranial mass, hemorrhage, or acute appearing infarct.   Electronically Signed   By: Lowella Grip M.D.   On: 06/25/2014 13:59   Dg Ang/ext/uni/or Right  06/25/2014   CLINICAL DATA:  Right lower extremity ischemia and right femoral embolectomy with patchy angioplasty of the right common femoral artery.  EXAM: RIGHT ANG/EXT/UNI/ OR  COMPARISON:  None.  FINDINGS: Intraoperative image demonstrates patent visualized popliteal artery and proximal aspects of the tibioperoneal trunk and  anterior tibial artery. There does appear to be spasm limiting outflow distally in the calf.  IMPRESSION: Probable spasm of tibial vessels below the knee. The visualized popliteal artery appears normally patent.   Electronically Signed   By: Aletta Edouard M.D.   On: 06/25/2014 14:54   Dg Chest Portable 1 View  07/07/2014   CLINICAL DATA:  Defibrillator malfunction. Shocked twice today. History of hypertension. Prior smoker.  EXAM: PORTABLE CHEST - 1 VIEW  COMPARISON:  01/10/2014  FINDINGS: Left AICD in place, unchanged. Cardiomegaly. Chronic interstitial prominence in the lower lungs, likely chronic interstitial lung disease/ fibrosis. No effusions. No edema or acute airspace opacity.  IMPRESSION: Cardiomegaly.  Stable chronic bibasilar fibrosis   Electronically Signed   By: Rolm Baptise M.D.   On: 07/07/2014 19:52    ASSESSMENT AND PLAN:  Active Problems:   VT (ventricular tachycardia)  1. VT  The patient has had recurrent VT is the setting of recent illness. Given interstitial lung disease, I worry about risks of amiodarone long term.  Presently he is doing well on sotalol 160mg  BID.  QT is stable. Continue metoprolol. He has a severe medically issue which I am actively treating with high medical risks of sudden death and progressive CHF. He will require close inpatient monitoring over the weekend.   2. Ischemic CM/ acute on chronic systolic dysfunction  The patient appears mildly volume overloaded on exam. We will continue aggressive medical therapy and monitor closely. I am holding medicines due to hypotension.  3. CAD  No ischemic symptoms  Continue current medicines as BP allows Second generation stents in 2013.  I will stop plavix today.  Continue ASA and coumadin.   4. PVD with recent thrombectomy/ fasciotomy  Appreciate VVS input.  Continue wet to dry dressings.  May need rehab at discharge. supratherapeutic INR.  Hold couamdin tonight.  5. Acute on chronic renal failure    Creatinine is worse today Will need to follow closely in the setting of increased sotalol.  He will remain in the hospital for arrhythmia management over the weekend.  Possibly home tomorrow, will need SW to assess as he may need SNF.   Thompson Grayer, MD 07/09/2014 1:05 PM

## 2014-07-09 NOTE — Evaluation (Signed)
Physical Therapy Evaluation Patient Details Name: Adam House MRN: [id number] DOB: 12/31/1950 Today's Date: 07/09/2014   History of Present Illness   Pt is admitted after his pacer/Defibrillator went off due to ventricular arrhythmia;  s/p Rt  femoral embolectomy with 4 compartment fasciotomy. PMH includes CAD, CHF, Ischemic cardiomyopathy, ICD, Gout.   Clinical Impression   Pt admitted with above. Pt currently with functional limitations due to the deficits listed below (see PT Problem List).  Pt will benefit from skilled PT to increase their independence and safety with mobility to allow discharge to the venue listed below.   Must be modified independent ot dc home, and given today's performance and bil LE pain, must consider SNF stay to maximize independence and safety with mobility prior to dc home      Follow Up Recommendations SNF (though it seems pt would prefer dc home)    Equipment Recommendations  Rolling walker with 5" wheels;3in1 (PT)    Recommendations for Other Services OT consult     Precautions / Restrictions Precautions Precautions: Fall Restrictions RLE Weight Bearing: Weight bearing as tolerated      Mobility  Bed Mobility Overal bed mobility: Modified Independent Bed Mobility: Supine to Sit     Supine to sit: Modified independent (Device/Increase time);HOB elevated Sit to supine: Modified independent (Device/Increase time)      Transfers Overall transfer level: Needs assistance Equipment used: Rolling walker (2 wheeled) Transfers: Sit to/from Stand Sit to Stand: Mod assist         General transfer comment: Mod anti-gravity assist to reach standing after first try with min assist was unsuccessful; Very painful bil LEs in standing and ultimately was only abl eot stand for approx 20 seconds  Ambulation/Gait             General Gait Details: unable today  Stairs            Wheelchair Mobility    Modified Rankin (Stroke  Patients Only)       Balance             Standing balance-Leahy Scale: Poor Standing balance comment: Required bil UE support for standing                             Pertinent Vitals/Pain Pain Assessment: 0-10 Pain Score: 7  Pain Location: R and LLEs Pain Descriptors / Indicators: Aching (especially in dependent position sitting EOB) Pain Intervention(s): Limited activity within patient's tolerance;Monitored during session;Repositioned See vitals flow sheet    Home Living Family/patient expects to be discharged to:: Private residence Living Arrangements: Spouse/significant other Available Help at Discharge: Family (daughter is a Quarry manager at [employer] (SNF)) Type of Home: House Home Access: Stairs to enter Entrance Stairs-Rails: Can reach both;Left;Right Entrance Stairs-Number of Steps: 5-6 Home Layout: Multi-level;Bed/bath upstairs Home Equipment: Walker - 2 wheels;Cane - single point;Shower seat      Prior Function Level of Independence: Independent with assistive device(s) (Prior to previous  admission earlier this month )         Comments: users AD due to gout flare ups      Hand Dominance   Dominant Hand: Right    Extremity/Trunk Assessment   Upper Extremity Assessment: Overall WFL for tasks assessed           Lower Extremity Assessment: Generalized weakness (Weight acceptance limited bilaterally due to pain)      Cervical / Trunk Assessment: Normal  Communication  Communication: No difficulties  Cognition Arousal/Alertness: Awake/alert Behavior During Therapy: WFL for tasks assessed/performed Overall Cognitive Status: Within Functional Limits for tasks assessed                      General Comments      Exercises        Assessment/Plan    PT Assessment Patient needs continued PT services  PT Diagnosis Difficulty walking;Acute pain   PT Problem List Decreased strength;Decreased range of motion;Decreased activity  tolerance;Decreased balance;Decreased mobility;Decreased cognition;Decreased knowledge of use of DME;Decreased safety awareness;Decreased knowledge of precautions;Pain;Impaired sensation  PT Treatment Interventions DME instruction;Gait training;Stair training;Functional mobility training;Therapeutic activities;Therapeutic exercise;Balance training;Neuromuscular re-education;Patient/family education   PT Goals (Current goals can be found in the Care Plan section) Acute Rehab PT Goals Patient Stated Goal: to go home PT Goal Formulation: With patient Time For Goal Achievement: 07/23/14 Potential to Achieve Goals: Good    Frequency Min 3X/week   Barriers to discharge Decreased caregiver support Must be modified independent to dc home    Co-evaluation               End of Session Equipment Utilized During Treatment: Gait belt Activity Tolerance: Patient limited by pain Patient left: in bed;with call bell/phone within reach Nurse Communication: Mobility status         Time: 2355-7322 PT Time Calculation (min): 35 min   Charges:   PT Evaluation $Initial PT Evaluation Tier I: 1 Procedure PT Treatments $Therapeutic Activity: 23-37 mins   PT G Codes:          Quin Hoop 07/09/2014, 1:33 PM  Roney Marion, [place] Pager [phone] Office [phone]

## 2014-07-10 DIAGNOSIS — N179 Acute kidney failure, unspecified: Secondary | ICD-10-CM

## 2014-07-10 LAB — BASIC METABOLIC PANEL
ANION GAP: 17 — AB (ref 5–15)
BUN: 40 mg/dL — AB (ref 6–23)
CHLORIDE: 98 meq/L (ref 96–112)
CO2: 19 mEq/L (ref 19–32)
Calcium: 9.3 mg/dL (ref 8.4–10.5)
Creatinine, Ser: 1.96 mg/dL — ABNORMAL HIGH (ref 0.50–1.35)
GFR calc non Af Amer: 35 mL/min — ABNORMAL LOW (ref >90)
GFR, EST AFRICAN AMERICAN: 40 mL/min — AB (ref >90)
Glucose, Bld: 84 mg/dL (ref 70–99)
POTASSIUM: 4.2 meq/L (ref 3.7–5.3)
SODIUM: 134 meq/L — AB (ref 137–147)

## 2014-07-10 LAB — PROTIME-INR
INR: 2.45 — AB (ref 0.00–1.49)
Prothrombin Time: 26.8 seconds — ABNORMAL HIGH (ref 11.6–15.2)

## 2014-07-10 LAB — MAGNESIUM: Magnesium: 2.2 mg/dL (ref 1.5–2.5)

## 2014-07-10 MED ORDER — WARFARIN SODIUM 5 MG PO TABS
5.0000 mg | ORAL_TABLET | Freq: Once | ORAL | Status: AC
  Administered 2014-07-10: 5 mg via ORAL
  Filled 2014-07-10: qty 1

## 2014-07-10 NOTE — Progress Notes (Signed)
SUBJECTIVE: The patient is doing ok today.  He has not been able to participate in PT due to bilateral leg pain.  He is going to give it his best effort today.  At this time, he denies chest pain, shortness of breath, or any new concerns.  [name] allopurinol  100 mg Oral Daily  . digoxin  0.125 mg Oral Daily  . ezetimibe  10 mg Oral Daily  . fentaNYL  50 mcg Intravenous Once  . isosorbide dinitrate  40 mg Oral TID  . metoprolol succinate  100 mg Oral Daily  . multivitamin with minerals  1 tablet Oral Daily  . pantoprazole  40 mg Oral Daily  . potassium chloride SA  20 mEq Oral Daily  . sotalol  160 mg Oral Q12H  . Warfarin - Physician Dosing Inpatient   Does not apply q1800      OBJECTIVE: Physical Exam: Filed Vitals:   07/09/14 1522 07/09/14 2037 07/09/14 2328 07/10/14 0418  BP: 94/53 95/66 102/58 113/57  Pulse: 56 61  55  Temp: 98.3 F (36.8 C) 98.4 F (36.9 C)  97.8 F (36.6 C)  TempSrc: Oral Oral  Oral  Resp: 18 17  18   Height:      Weight:    186 lb (84.369 kg)  SpO2: 99% 97%  98%    Intake/Output Summary (Last 24 hours) at 07/10/14 0805 Last data filed at 07/10/14 0400  Gross per 24 hour  Intake   1220 ml  Output   1425 ml  Net   -205 ml    Telemetry reveals sinus rhythm  GEN- The patient is chronically ill appearing, alert and oriented x 3 today.   Head- normocephalic, atraumatic Eyes-  Sclera clear, conjunctiva pink Ears- hearing intact Oropharynx- clear Neck- supple  Lungs- Clear to ausculation bilaterally, normal work of breathing Heart- Regular rate and rhythm  GI- soft, NT, ND, + BS Extremities- no clubbing, cyanosis, or edema Skin- R leg dressing in place, there is an area of warmth/redness/ ttp over the L lateral midfoot Psych- euthymic mood, full affect Neuro- strength and sensation are intact  LABS: Basic Metabolic Panel:  Recent Labs  07/09/14 0445 07/10/14 0316  NA 131* 134*  K 4.6 4.2  CL 95* 98  CO2 21 19  GLUCOSE 99 84  BUN 34*  40*  CREATININE 1.97* 1.96*  CALCIUM 9.2 9.3  MG 2.0 2.2   Liver Function Tests: No results found for this basename: AST, ALT, ALKPHOS, BILITOT, PROT, ALBUMIN,  in the last 72 hours No results found for this basename: LIPASE, AMYLASE,  in the last 72 hours CBC:  Recent Labs  07/07/14 1907 07/08/14 0246  WBC 11.5* 9.7  HGB 13.0 11.9*  HCT 36.6* 34.0*  MCV 79.2 79.3  PLT 443* 425*      ASSESSMENT AND PLAN:  Active Problems:   VT (ventricular tachycardia)  1. VT  Presently he is doing well on sotalol 160mg  BID.  QT is stable. Continue metoprolol.    2. Ischemic CM/ acute on chronic systolic dysfunction  Appears dry today Hold lasix  3. CAD  No ischemic symptoms  Continue current medicines as BP allows Second generation stents in 2013.  I have stopped plavix.  Continue ASA and coumadin.   4. PVD with recent thrombectomy/ fasciotomy  Appreciate VVS input.  Continue wet to dry dressings.  May need rehab at discharge.  5. Acute on chronic renal failure  Creatinine is stable today.  He appears  dry. Will hold lasix, losartan, and spironolactone and repeat bmet today Will need to follow closely in the setting of increased sotalol.   Possibly home tomorrow, will need SW to assess as he may need SNF.  Hopefully if he is more ambulatory he may actually be able to go home.   Thompson Grayer, MD 07/10/2014 8:05 AM

## 2014-07-10 NOTE — Discharge Summary (Signed)
Agree with plans for D/C.  Deitra Mayo, MD, [place] [phone] 07/10/2014

## 2014-07-10 NOTE — Progress Notes (Signed)
07/10/2014 11:28 PM Patient was able to ambulate from his bed to the door way and then back to his bed at about 28 feet. The patient tolerated the exercise fairly. Will continue to monitor. Lupita Dawn

## 2014-07-10 NOTE — Progress Notes (Signed)
ANTICOAGULATION CONSULT NOTE - Initial Consult  Pharmacy Consult for Coumadin Indication: PVD  Allergies  Allergen Reactions  . Lisinopril Anaphylaxis and Swelling    angioedema  . Naproxen Hives    Patient Measurements: Height: 5\' 9"  (175.3 cm) Weight: 186 lb (84.369 kg) IBW/kg (Calculated) : 70.7   Vital Signs: Temp: 97.6 F (36.4 C) (10/26 1406) Temp Source: Oral (10/26 1406) BP: 104/62 mmHg (10/26 1534) Pulse Rate: 52 (10/26 1534)  Labs:  Recent Labs  07/07/14 1907 07/08/14 0246 07/09/14 0445 07/10/14 0316  HGB 13.0 11.9*  --   --   HCT 36.6* 34.0*  --   --   PLT 443* 425*  --   --   LABPROT 22.7* 23.7* 34.2* 26.8*  INR 1.98* 2.09* 3.35* 2.45*  CREATININE 1.40* 1.39* 1.97* 1.96*    Estimated Creatinine Clearance: 38.6 ml/min (by C-G formula based on Cr of 1.96).   Medical History: Past Medical History  Diagnosis Date  . CAD (coronary artery disease)     a. ant MI 1998 with PCI to LAD. b. Ant-lat MI 04/2012 tx with overlapping DES to LAD.  [name] Systolic CHF     NYHA Class II/III  . Ischemic cardiomyopathy     EF 25-30% by echo 04/2012; [place]  . CRI (chronic renal insufficiency)   . Unspecified essential hypertension   . Impotence of organic origin   . Esophageal reflux   . Tobacco use disorder   . ICD (implantable cardiac defibrillator) in place   . Hypercholesteremia   . Heart murmur   . CVA (cerebral infarction) 11/2010  . Gout   . 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 Lung disease, interstitial     Assessment: 63 year old male on Coumadin PTA for stroke hx / PVD Admit INR was 3.35 - Coumadin held INR today = 2.45   Goal of Therapy:  INR 2-3 Monitor platelets by anticoagulation protocol: Yes   Plan:  1) Coumadin 5 mg po x 1 dose tonight 2) Daily INR  Thank you. Anette Guarneri, PharmD [phone]  07/10/2014,6:06 PM

## 2014-07-10 NOTE — Progress Notes (Signed)
Attempted to ambulate pt in hallway.  Pt ambulated to the door in his room and was not able to go any further due to increased pain in his legs from his gout.  Will continue to monitor.

## 2014-07-10 NOTE — Clinical Social Work Placement (Signed)
Clinical Social Work Department CLINICAL SOCIAL WORK PLACEMENT NOTE 07/10/2014  Patient:  KHADIR, ROAM  Account Number:  [id number] Admit date:  07/07/2014  Clinical Social Worker:  Taraya Steward, LCSWA  Date/time:  07/10/2014 12:45 PM  Clinical Social Work is seeking post-discharge placement for this patient at the following level of care:   SKILLED NURSING   (*CSW will update this form in Epic as items are completed)   07/10/2014  Patient/family provided with [facility] Department of Clinical Social Work's list of facilities offering this level of care within the geographic area requested by the patient (or if unable, by the patient's family).  07/10/2014  Patient/family informed of their freedom to choose among providers that offer the needed level of care, that participate in Medicare, Medicaid or managed care program needed by the patient, have an available bed and are willing to accept the patient.  07/10/2014  Patient/family informed of [facility]' ownership interest in [facility], as well as of the fact that they are under no obligation to receive care at this facility.  PASARR submitted to EDS on 07/10/2014 PASARR number received on 07/10/2014  FL2 transmitted to all facilities in geographic area requested by pt/family on  07/10/2014 FL2 transmitted to all facilities within larger geographic area on 07/10/2014  Patient informed that his/her managed care company has contracts with or will negotiate with  certain facilities, including the following:     Patient/family informed of bed offers received:   Patient chooses bed at  Physician recommends and patient chooses bed at    Patient to be transferred to  on   Patient to be transferred to facility by  Patient and family notified of transfer on  Name of family member notified:    The following physician request were entered in Epic:   Additional Comments:   Antoinne Spadaccini R. [place], MSW,  [place] 07/10/2014 11:41 PM

## 2014-07-10 NOTE — Clinical Social Work Psychosocial (Signed)
Clinical Social Work Department BRIEF PSYCHOSOCIAL ASSESSMENT 07/10/2014  Patient:  Adam House, Adam House     Account Number:  [id number]     Admit date:  07/07/2014  Clinical Social Worker:  Dian Queen  Date/Time:  07/10/2014 12:30 PM  Referred by:  Physician  Date Referred:  07/10/2014 Referred for  SNF Placement   Other Referral:   Interview type:  Patient Other interview type:    PSYCHOSOCIAL DATA Living Status:  WIFE Admitted from facility:   Level of care:   Primary support name:  Adam House Primary support relationship to patient:  SPOUSE Degree of support available:   Patient's wife Adam House is the primary support at home.    CURRENT CONCERNS Current Concerns  Post-Acute Placement   Other Concerns:    SOCIAL WORK ASSESSMENT / PLAN Patient is a 63 year old male who lives with his wife in [place].  Patient was readmitted to hospital.  Patient was receiving home health.  Patient is alert and oriented x3, friendly, and talkative.  Patient states he has never been in a SNF before for rehab.  Patient asked about SNF and what to expect.  Patient was informed that people usually attend a SNF for rehab and then plan to return back home once their strengthening has returned, and patient is safe to return back home.  Patient states his wife just went through finding placement for her dad and she has been stressed lately.  Patient is in agreement to attend SNF for rehab once patient is medically ready and discharge orders have been given.  The plan is to return back home once he has gained his strength back.   Assessment/plan status:   Other assessment/ plan:   Information/referral to community resources:    PATIENT'S/FAMILY'S RESPONSE TO PLAN OF CARE: Patient in agreement to plan of care.    Adam House. Adam House, MSW, [place] 07/10/2014 11:39 PM

## 2014-07-10 NOTE — Progress Notes (Signed)
Pt HR 56, [name], PA notified about pt receiving Digoxin 0.125 with a heart rate of 56. No orders to hold med, she said it would be okay for pt to receive.

## 2014-07-10 NOTE — Progress Notes (Signed)
  Progress Note    07/10/2014 7:49 AM * No surgery found *  Subjective:  No complaints  afebrile  Filed Vitals:   07/10/14 0418  BP: 113/57  Pulse: 55  Temp: 97.8 F (36.6 C)  Resp: 18    Physical Exam: Cardiac:  regular Lungs:  Non labored Incisions:  Right groin is healing nicely Extremities:  2+ palpable right DP pulse; fasciotomy site is wrapped and dressing is clean and dry.  CBC    Component Value Date/Time   WBC 9.7 07/08/2014 0246   RBC 4.29 07/08/2014 0246   HGB 11.9* 07/08/2014 0246   HCT 34.0* 07/08/2014 0246   PLT 425* 07/08/2014 0246   MCV 79.3 07/08/2014 0246   MCH 27.7 07/08/2014 0246   MCHC 35.0 07/08/2014 0246   RDW 15.8* 07/08/2014 0246   LYMPHSABS 0.9 06/24/2014 1000   MONOABS 1.1* 06/24/2014 1000   EOSABS 0.2 06/24/2014 1000   BASOSABS 0.0 06/24/2014 1000    BMET    Component Value Date/Time   NA 134* 07/10/2014 0316   K 4.2 07/10/2014 0316   CL 98 07/10/2014 0316   CO2 19 07/10/2014 0316   GLUCOSE 84 07/10/2014 0316   BUN 40* 07/10/2014 0316   CREATININE 1.96* 07/10/2014 0316   CREATININE 1.34 07/21/2013 1212   CALCIUM 9.3 07/10/2014 0316   GFRNONAA 35* 07/10/2014 0316   GFRAA 40* 07/10/2014 0316    INR    Component Value Date/Time   INR 2.45* 07/10/2014 0316   INR 2.5 07/07/2014     Intake/Output Summary (Last 24 hours) at 07/10/14 0749 Last data filed at 07/10/14 0400  Gross per 24 hour  Intake   1220 ml  Output   1425 ml  Net   -205 ml     Assessment:  63 y.o. male is s/p:  1. Right femoral embolectomy  2. Bovine pericardial patch angioplasty of the right common femoral artery  3. Intraoperative arteriogram  4. 4 compartment fasciotomy 15 Days Post-Op  Plan: -pt doing well this am from vascular standpoint.  He does have a palpable right DP and foot is warm. -(will come back after OR to check fasciotomy sites) -DVT prophylaxis:  Coumadin-INR today is 2.45 (down from 3.35) -continue to increase  mobilization -possible rehab after discharge -meds per Cardiology -creatinine is stable from yesterday.    Leontine Locket, PA-C [facility] [phone] 07/10/2014 7:49 AM

## 2014-07-10 NOTE — Progress Notes (Signed)
Physical Therapy Treatment Patient Details Name: Adam House MRN: [id number] DOB: Apr 19, 1951 Today's Date: 07/10/2014    History of Present Illness  Pt is admitted after his pacer/Defibrillator went off due to ventricular arrhythmia;  s/p Rt  femoral embolectomy with 4 compartment fasciotomy. PMH includes CAD, CHF, Ischemic cardiomyopathy, ICD, Gout.     PT Comments    Pt with limited progression with mobility but very determined to progress and advance function limited by pain. Pt educated for HEP and dorsiflexion stretching. Pt educated to only mobilize with staff assist and for deficits with gait currently. Will follow.    Follow Up Recommendations  SNF     Equipment Recommendations       Recommendations for Other Services       Precautions / Restrictions Precautions Precautions: Fall Restrictions RLE Weight Bearing: Weight bearing as tolerated    Mobility  Bed Mobility Overal bed mobility: Modified Independent                Transfers     Transfers: Sit to/from Stand Sit to Stand: Min assist         General transfer comment: cues for hand placement, position and safety as pt pulling up on Rw despite cues  Ambulation/Gait Ambulation/Gait assistance: Min guard Ambulation Distance (Feet): 16 Feet Assistive device: Rolling walker (2 wheeled) Gait Pattern/deviations: Shuffle;Step-to pattern;Decreased dorsiflexion - right   Gait velocity interpretation: Below normal speed for age/gender General Gait Details: pt with bil knees hyperextended throughout gait with self too close to Rw throughout despite cues with short shuffling steps due to pain with no dorsiflexion past neutral on RLE   Stairs            Wheelchair Mobility    Modified Rankin (Stroke Patients Only)       Balance Overall balance assessment: Needs assistance   Sitting balance-Leahy Scale: Good       Standing balance-Leahy Scale: Poor                       Cognition Arousal/Alertness: Awake/alert Behavior During Therapy: WFL for tasks assessed/performed Overall Cognitive Status: Within Functional Limits for tasks assessed                      Exercises General Exercises - Lower Extremity Long Arc Quad: AROM;Right;20 reps;Seated Hip Flexion/Marching: AROM;Right;20 reps;Seated Other Exercises Other Exercises: passive dorsiflexion stretch RLE x 5 with education for use of gait belt to stretch    General Comments        Pertinent Vitals/Pain Pain Score: 8  Pain Location: bil LE Pain Descriptors / Indicators: Aching Pain Intervention(s): Repositioned;Premedicated before session    Home Living                      Prior Function            PT Goals (current goals can now be found in the care plan section) Progress towards PT goals: Progressing toward goals (slowly limited by pain bil LE)    Frequency       PT Plan Current plan remains appropriate    Co-evaluation             End of Session Equipment Utilized During Treatment: Gait belt Activity Tolerance: Patient limited by pain Patient left: in chair;with call bell/phone within House     Time: 1147-1204 PT Time Calculation (min): 17 min  Charges:  $Therapeutic Activity: 8-22 mins  G CodesMelford House 2014/07/13, 12:11 PM Adam House, Adam House

## 2014-07-10 NOTE — Progress Notes (Addendum)
Returned to see pt this afternoon to change his dressing and check fasciotomy site.  Wound bed is beefy red with minimal fibrinous tissue around the medial side of wound.  Drain sponges were used at last dressing change.  Please don't use dressing sponges for wet to dry dressings.   Will change dressing changes to at least tid.  Orders are for dressing change bid and it has not been changed today.    Pt tolerated dressing change well.    RHYNE, SAMANTHA 07/10/2014 1:18 PM  Agree with above note.  Deitra Mayo, MD, [place] [phone] 07/11/2014

## 2014-07-11 LAB — BASIC METABOLIC PANEL
ANION GAP: 14 (ref 5–15)
BUN: 37 mg/dL — ABNORMAL HIGH (ref 6–23)
CALCIUM: 9.3 mg/dL (ref 8.4–10.5)
CO2: 21 mEq/L (ref 19–32)
CREATININE: 1.67 mg/dL — AB (ref 0.50–1.35)
Chloride: 97 mEq/L (ref 96–112)
GFR calc non Af Amer: 42 mL/min — ABNORMAL LOW (ref >90)
GFR, EST AFRICAN AMERICAN: 49 mL/min — AB (ref >90)
Glucose, Bld: 99 mg/dL (ref 70–99)
Potassium: 4.5 mEq/L (ref 3.7–5.3)
Sodium: 132 mEq/L — ABNORMAL LOW (ref 137–147)

## 2014-07-11 LAB — PROTIME-INR
INR: 2.22 — AB (ref 0.00–1.49)
PROTHROMBIN TIME: 24.8 s — AB (ref 11.6–15.2)

## 2014-07-11 MED ORDER — SOTALOL HCL 160 MG PO TABS: 160.0000 mg | ORAL_TABLET | Freq: Two times a day (BID) | ORAL | Status: DC

## 2014-07-11 MED ORDER — ASPIRIN EC 81 MG PO TBEC: 81.0000 mg | DELAYED_RELEASE_TABLET | Freq: Every day | ORAL | Status: DC

## 2014-07-11 NOTE — Progress Notes (Signed)
Physician Discharge Summary  Patient ID: Adam House MRN: [id number] DOB/AGE: 63-Mar-1952 63 y.o.  Primary Cardiologist: Dr. Bronson Ing Primary Electrophysiologist: Dr. Rayann Heman  Admit date: 07/07/2014 Discharge date: 07/11/2014  Admission Diagnoses: Ventricular Tachycardia  Discharge Diagnoses:  Active Problems:   VT (ventricular tachycardia)   Discharged Condition: stable  Hospital Course: Adam House is a 63 y.o. male with a past medical history significant for CAD, ischemic cardiomyopathy with EF of 20-25% s/p ICD implantation, systolic heart failure, PVD, hypertension, prior CVA, and interstitial lung disease (followed by Dr Chase Caller pending VATS with Roxan Hockey).   He was recently admitted on 10-10 - 07/03/14 with acute thromboembolism of right foot and underwent thrombectomy and fasciotomy at that time. He developed ventricular tachycardia that was predominately pace terminated. Sotalol was added and he did well without recurrent arrhythmias. He was discharged on Sotalol 80mg  twice daily and Metoprolol 100mg  daily.   He presented back to the [facility] ER on 07/07/14 after sustaining an ICD shock. He had recurrent VT in the ER that failed to terminate with ATP but terminated with HV shock. He was admitted for further evaluation. Initially, IV Amiodarone was started in addition to his sotalol. He was evaluated by Electrophysiology. Given his interstitial lung disease, there was concern about risks of amiodarone long term. Therefore, it was decided to try optimization of sotalol first. Amiodarone was discontinued and sotalol was increased to 160 mg BID. Metoprolol 100 mg daily was continued. He tolerated the increase in sotalol well. QT interval remained stable. No further VT was noted.  Also, on admit, he was noted to be mildly volume overloaded and was treated with Lasix, spironolactone, losartan and metoprolol. He diuresed back to a euvolemic state, but developed acute on  chronic renal failure with SCr increasing from 1.4 to 1.97. His Lasix, spirolactone and ARB were placed on hold and Scr started to improve back towards baseline. On discharge, low dose losartan was restarted. However, it was recommended that his Lasix and spirolactone continue to be held until office follow-up.   During this admission, Vascular Surgery was also consulted for management/ recommendations regarding his fasciotomy. Recommendations were for continuation of wet to dry dressings. WOC assisted with dressing changes.    On 07/11/14, he was examined by Dr. Rayann Heman, who felt that he was stable for discharge home. Transition of care follow-up has been arranged with his primary cardiologist, Dr. Bronson Ing, in [place] on 07/21/14. He is scheduled for EP follow-up with Dr. Rayann Heman, in [place], on 08/14/14.     Consults: vascular surgery  Significant Diagnostic Studies:    Treatments: See Hospital Course  Discharge Exam: Blood pressure 115/67, pulse 59, temperature 97.6 F (36.4 C), temperature source Oral, resp. rate 18, height 5\' 9"  (1.753 m), weight 183 lb (83.008 kg), SpO2 100.00%.   Disposition: 06-Home-Health Care Svc     Medication List    STOP taking these medications       clopidogrel 75 MG tablet  Commonly known as:  PLAVIX     furosemide 40 MG tablet  Commonly known as:  LASIX     spironolactone 25 MG tablet  Commonly known as:  ALDACTONE      TAKE these medications       albuterol 108 (90 BASE) MCG/ACT inhaler  Commonly known as:  PROVENTIL HFA;VENTOLIN HFA  Inhale 2 puffs into the lungs every 6 (six) hours as needed for wheezing or shortness of breath. For shortness of breath     allopurinol 100 MG tablet  Commonly known as:  ZYLOPRIM  Take 100 mg by mouth daily.     aspirin EC 81 MG tablet  Take 1 tablet (81 mg total) by mouth daily.     colchicine 0.6 MG tablet  Take 1 tablet (0.6 mg total) by mouth daily as needed. For gout     digoxin 0.125 MG tablet    Commonly known as:  LANOXIN  Take 0.125 mg by mouth daily.     ezetimibe 10 MG tablet  Commonly known as:  ZETIA  Take 1 tablet (10 mg total) by mouth daily.     fish oil-omega-3 fatty acids 1000 MG capsule  Take 2 g by mouth daily.     isosorbide dinitrate 40 MG CR capsule  Commonly known as:  DILATRATE-SR  Take 1 capsule (40 mg total) by mouth 3 (three) times daily.     losartan 25 MG tablet  Commonly known as:  COZAAR  Take 1 tablet (25 mg total) by mouth daily.     metoprolol succinate 100 MG 24 hr tablet  Commonly known as:  TOPROL XL  Take 1 tablet (100 mg total) by mouth daily. Take with or immediately following a meal.     multivitamin Tabs tablet  Take 1 tablet by mouth daily.     nitroGLYCERIN 0.4 MG SL tablet  Commonly known as:  NITROSTAT  Place 1 tablet (0.4 mg total) under the tongue every 5 (five) minutes as needed for chest pain (up to 3 doses).     oxyCODONE 5 MG immediate release tablet  Commonly known as:  ROXICODONE  Take 1 tablet (5 mg total) by mouth every 6 (six) hours as needed.     pantoprazole 40 MG tablet  Commonly known as:  PROTONIX  Take 1 tablet (40 mg total) by mouth as needed.     potassium chloride SA 20 MEQ tablet  Commonly known as:  KLOR-CON M20  Take 1 tablet (20 mEq total) by mouth daily.     sotalol 160 MG tablet  Commonly known as:  BETAPACE  Take 1 tablet (160 mg total) by mouth 2 (two) times daily.     warfarin 5 MG tablet  Commonly known as:  COUMADIN  Take 1 tablet (5 mg total) by mouth daily.       Follow-up Information   Follow up with Herminio Commons, MD On 07/21/2014. (3:40 pm)    Specialty:  Cardiology   Contact information:   [place] [address] [phone]       Follow up with Thompson Grayer, MD On 08/14/2014. (11:30 am )    Specialty:  Cardiology   Contact information:   [place] [address] [place], INCLUDING  PHYSICIAN TIME: >30 MINUTES  Signed: Lyda Jester 07/11/2014, 12:07 PM  Thompson Grayer MD

## 2014-07-11 NOTE — Progress Notes (Signed)
   VASCULAR SURGERY ASSESSMENT & PLAN:  * S/P Right LE FASCIOTOMY: apparently, the VAC was not approved by his insurance company. For this reason this was apparently discontinued. Therefore he is currently getting dressing changes to the wound on the lateral aspect of his right leg. Given that the dressings are only been done once a day, I have recommended that we use hydrogel to keep the wound moist covered by moist 4 x 4's (normal saline), covered with dry Kerlix and a 4 inch Ace. I can follow this wound in the office once he is discharged.  * He is now over 2 weeks postop status post right femoral embolectomy. I will write to have his staples removed from the medial aspect of his right leg.  SUBJECTIVE: No specific complaints except that he once to go home.  PHYSICAL EXAM: Filed Vitals:   07/10/14 1534 07/10/14 2136 07/11/14 0622 07/11/14 1034  BP: 104/62 111/64 104/56 115/67  Pulse: 52 57 57 59  Temp:  98.5 F (36.9 C) 97.7 F (36.5 C) 97.6 F (36.4 C)  TempSrc:  Oral Oral Oral  Resp:  18 18 18   Height:      Weight:   183 lb (83.008 kg)   SpO2: 100% 99%  100%   Fasciotomy site looks fairly clean with a small amount of fibrin exudate.  LABS: Lab Results  Component Value Date   WBC 9.7 07/08/2014   HGB 11.9* 07/08/2014   HCT 34.0* 07/08/2014   MCV 79.3 07/08/2014   PLT 425* 07/08/2014   Lab Results  Component Value Date   CREATININE 1.67* 07/11/2014   Lab Results  Component Value Date   INR 2.22* 07/11/2014    Active Problems:   VT (ventricular tachycardia)   Gae Gallop Beeper: [phone] 07/11/2014

## 2014-07-11 NOTE — Progress Notes (Signed)
Discharge education completed by RN. Pt received a copy of discharge paperwork and confirm understanding of follow up appointments and discharge medications. Both deny any questions at this time. IV removed, site is within normal limits. Dressing change to RLE completed.  Pt will discharge from the unit via wheelchair.

## 2014-07-11 NOTE — Care Management Note (Signed)
    Page 1 of 1   07/11/2014     4:23:09 PM CARE MANAGEMENT NOTE 07/11/2014  Patient:  JISHNU, JENNIGES   Account Number:  [id number]  Date Initiated:  07/11/2014  Documentation initiated by:  Elanda Garmany  Subjective/Objective Assessment:   Pt adm on 07/07/14 with Vtach, ICD fire.  PTA, pt resides at home with spouse and is active with [facility] for [facility] needs.     Action/Plan:   Pt for dc home today; he is refusing recommendation of SNF placement.  Will resume HH with [facility] as prior to admission.   Anticipated DC Date:  07/11/2014   Anticipated DC Plan:  [place]  CM consult      [facility] Choice  HOME HEALTH   Choice offered to / List presented to:  C-1 Patient        [place] arranged  HH-1 RN  [place].   Status of service:  Completed, signed off Medicare Important Message given?  YES (If response is "NO", the following Medicare IM given date fields will be blank) Date Medicare IM given:  07/11/2014 Medicare IM given by:  Dakayla Disanti Date Additional Medicare IM given:   Additional Medicare IM given by:    Discharge Disposition:  [place]  Per UR Regulation:  Reviewed for med. necessity/level of care/duration of stay  If discussed at [place] of Stay Meetings, dates discussed:    Comments:

## 2014-07-11 NOTE — Progress Notes (Signed)
ANTICOAGULATION CONSULT NOTE - Follow Up Consult  Pharmacy Consult for Coumadin Indication: PVD  Allergies  Allergen Reactions  . Lisinopril Anaphylaxis and Swelling    angioedema  . Naproxen Hives    Patient Measurements: Height: 5\' 9"  (175.3 cm) Weight: 183 lb (83.008 kg) IBW/kg (Calculated) : 70.7 Heparin Dosing Weight:   Vital Signs: Temp: 97.6 F (36.4 C) (10/27 1034) Temp Source: Oral (10/27 1034) BP: 115/67 mmHg (10/27 1034) Pulse Rate: 59 (10/27 1034)  Labs:  Recent Labs  07/09/14 0445 07/10/14 0316 07/11/14 0530  LABPROT 34.2* 26.8* 24.8*  INR 3.35* 2.45* 2.22*  CREATININE 1.97* 1.96* 1.67*    Estimated Creatinine Clearance: 45.3 ml/min (by C-G formula based on Cr of 1.67).   Medications:  Scheduled:  . allopurinol  100 mg Oral Daily  . digoxin  0.125 mg Oral Daily  . ezetimibe  10 mg Oral Daily  . fentaNYL  50 mcg Intravenous Once  . isosorbide dinitrate  40 mg Oral TID  . metoprolol succinate  100 mg Oral Daily  . multivitamin with minerals  1 tablet Oral Daily  . pantoprazole  40 mg Oral Daily  . potassium chloride SA  20 mEq Oral Daily  . sotalol  160 mg Oral Q12H   Goal of Therapy:  INR 2-3 Monitor platelets by anticoagulation protocol: Yes  Assessment/Plan:  63yo male with PVD, continuing Coumadin.  INR 2.22 this AM, down some but dose was held for inc INR on 10/25.  No bleeding noted, expect d/c home today.   Coumadin 5mg  today if not discharged.  Gracy Bruins, PharmD Clinical Pharmacist [facility]

## 2014-07-11 NOTE — Progress Notes (Signed)
SUBJECTIVE: The patient is doing follow-up better day.  He is ambulating better and wants to try to return home with [facility].  At this time, he denies chest pain, shortness of breath, or any new concerns.  [name] allopurinol  100 mg Oral Daily  . digoxin  0.125 mg Oral Daily  . ezetimibe  10 mg Oral Daily  . fentaNYL  50 mcg Intravenous Once  . isosorbide dinitrate  40 mg Oral TID  . metoprolol succinate  100 mg Oral Daily  . multivitamin with minerals  1 tablet Oral Daily  . pantoprazole  40 mg Oral Daily  . potassium chloride SA  20 mEq Oral Daily  . sotalol  160 mg Oral Q12H      OBJECTIVE: Physical Exam: Filed Vitals:   07/10/14 1406 07/10/14 1534 07/10/14 2136 07/11/14 0622  BP: 105/62 104/62 111/64 104/56  Pulse: 55 52 57 57  Temp: 97.6 F (36.4 C)  98.5 F (36.9 C) 97.7 F (36.5 C)  TempSrc: Oral  Oral Oral  Resp: 18  18 18   Height:      Weight:    183 lb (83.008 kg)  SpO2: 98% 100% 99%     Intake/Output Summary (Last 24 hours) at 07/11/14 1012 Last data filed at 07/11/14 0849  Gross per 24 hour  Intake    804 ml  Output   1400 ml  Net   -596 ml    Telemetry reveals sinus rhythm  GEN- The patient is chronically ill appearing, alert and oriented x 3 today.  More energetic today Head- normocephalic, atraumatic Eyes-  Sclera clear, conjunctiva pink Ears- hearing intact Oropharynx- clear Neck- supple  Lungs- Clear to ausculation bilaterally, normal work of breathing Heart- Regular rate and rhythm  GI- soft, NT, ND, + BS Extremities- no clubbing, cyanosis, or edema Skin- R leg dressing in place,   Psych- euthymic mood, full affect Neuro- strength and sensation are intact  LABS: Basic Metabolic Panel:  Recent Labs  07/09/14 0445 07/10/14 0316 07/11/14 0530  NA 131* 134* 132*  K 4.6 4.2 4.5  CL 95* 98 97  CO2 21 19 21   GLUCOSE 99 84 99  BUN 34* 40* 37*  CREATININE 1.97* 1.96* 1.67*  CALCIUM 9.2 9.3 9.3  MG 2.0 2.2  --    Liver Function Tests: No  results found for this basename: AST, ALT, ALKPHOS, BILITOT, PROT, ALBUMIN,  in the last 72 hours No results found for this basename: LIPASE, AMYLASE,  in the last 72 hours CBC: No results found for this basename: WBC, NEUTROABS, HGB, HCT, MCV, PLT,  in the last 72 hours    ASSESSMENT AND PLAN:  Active Problems:   VT (ventricular tachycardia)  1. VT  Presently he is doing well on sotalol 160mg  BID.  QT is stable. Continue metoprolol.    2. Ischemic CM/ acute on chronic systolic dysfunction  Appears dry today Hold lasix at discharge.  Could restart low dose ARB but would not restart spironolactone or lasix until his transition of care visit.  I doubt that he will need much lasix in the near future.  3. CAD  No ischemic symptoms  Continue current medicines as BP allows Second generation stents in 2013.  I have stopped plavix.  Continue ASA and coumadin.   4. PVD with recent thrombectomy/ fasciotomy  Appreciate VVS input.  Continue wet to dry dressings.    5. Acute on chronic renal failure  Improved with holding lasix and spironolactone  He wants to go home.  We will try to arrange this for today Will need a transition of care visit and close follow-up with Dr Bronson Ing in [place] I will see again in 4 weeks   Thompson Grayer, MD 07/11/2014 10:12 AM He has

## 2014-07-12 ENCOUNTER — Telehealth: Payer: Self-pay | Admitting: *Deleted

## 2014-07-12 ENCOUNTER — Encounter: Payer: BC Managed Care – PPO | Admitting: Cardiovascular Disease

## 2014-07-12 NOTE — Telephone Encounter (Signed)
Juliann Pulse had called in protime to sally last week and has order to do again tomorrow. He just got home with new orders from hospital and no mention of protime to be done. When do we want him checked/tmj

## 2014-07-12 NOTE — Telephone Encounter (Signed)
Order given to Rosario Adie RN [facility] to check INR tomorrow 07/13/14 with results to me.

## 2014-07-13 ENCOUNTER — Ambulatory Visit (INDEPENDENT_AMBULATORY_CARE_PROVIDER_SITE_OTHER): Payer: BC Managed Care – PPO | Admitting: *Deleted

## 2014-07-13 DIAGNOSIS — I824Y9 Acute embolism and thrombosis of unspecified deep veins of unspecified proximal lower extremity: Secondary | ICD-10-CM

## 2014-07-13 LAB — POCT INR: INR: 3.9

## 2014-07-17 ENCOUNTER — Encounter: Payer: Self-pay | Admitting: Vascular Surgery

## 2014-07-18 ENCOUNTER — Encounter: Payer: Self-pay | Admitting: Vascular Surgery

## 2014-07-18 ENCOUNTER — Ambulatory Visit (INDEPENDENT_AMBULATORY_CARE_PROVIDER_SITE_OTHER): Payer: Self-pay | Admitting: Vascular Surgery

## 2014-07-18 VITALS — BP 98/65 | HR 68 | Resp 16 | Ht 69.5 in | Wt 185.0 lb

## 2014-07-18 DIAGNOSIS — I739 Peripheral vascular disease, unspecified: Secondary | ICD-10-CM

## 2014-07-18 NOTE — Progress Notes (Signed)
Subjective:     Patient ID: Adam House, male   DOB: [birth year], 63 y.o.   MRN: [id number]  HPIthis 63 year old male returns for follow-up having undergone right femoral embolectomy and fasciotomy by Dr. Doren House on 06/24/2014. Patient has an open wound in the right lateral leg where the fasciotomy was performed and is getting daily dressing changes with the home health coming twice weekly. His right foot has some mild discomfort but generally feels well. He is ambulating.     Review of Systems     Objective:   Physical Exam BP 98/65 mmHg  Pulse 68  Resp 16  Ht 5' 9.5" (1.765 m)  Wt 185 lb (83.915 kg)  BMI 26.94 kg/m2  Gen. Well-developed well-nourished male in no apparent stress alert and oriented 3 Right leg with 3+ femoral and 3+ dorsalis pedis pulse palpable. Running wound well-healed. Right popliteal and well healed. Lateral fasciotomy wound measures about 8 x 3 cm. Muscle appears viable with no purulent drainage.     Assessment:     Status post embolectomy with 4 compartment fasciotomy with open fasciotomy wound      Plan:     Patient to continue daily dressing changes at home as he is doing. Skin staples removed today from popliteal wound Patient to return in 3-4 weeks for continued follow-up with Dr. Gae House

## 2014-07-19 ENCOUNTER — Ambulatory Visit (INDEPENDENT_AMBULATORY_CARE_PROVIDER_SITE_OTHER): Payer: BC Managed Care – PPO | Admitting: *Deleted

## 2014-07-19 DIAGNOSIS — I824Y9 Acute embolism and thrombosis of unspecified deep veins of unspecified proximal lower extremity: Secondary | ICD-10-CM

## 2014-07-19 LAB — POCT INR: INR: 2.7

## 2014-07-21 ENCOUNTER — Encounter: Payer: Self-pay | Admitting: Cardiovascular Disease

## 2014-07-21 ENCOUNTER — Ambulatory Visit (INDEPENDENT_AMBULATORY_CARE_PROVIDER_SITE_OTHER): Payer: BC Managed Care – PPO | Admitting: Cardiovascular Disease

## 2014-07-21 VITALS — BP 100/62 | HR 60 | Ht 69.5 in | Wt 180.0 lb

## 2014-07-21 DIAGNOSIS — I779 Disorder of arteries and arterioles, unspecified: Secondary | ICD-10-CM

## 2014-07-21 DIAGNOSIS — J849 Interstitial pulmonary disease, unspecified: Secondary | ICD-10-CM

## 2014-07-21 DIAGNOSIS — N183 Chronic kidney disease, stage 3 unspecified: Secondary | ICD-10-CM

## 2014-07-21 DIAGNOSIS — I10 Essential (primary) hypertension: Secondary | ICD-10-CM

## 2014-07-21 DIAGNOSIS — Z95810 Presence of automatic (implantable) cardiac defibrillator: Secondary | ICD-10-CM

## 2014-07-21 DIAGNOSIS — I5022 Chronic systolic (congestive) heart failure: Secondary | ICD-10-CM

## 2014-07-21 DIAGNOSIS — I739 Peripheral vascular disease, unspecified: Secondary | ICD-10-CM

## 2014-07-21 DIAGNOSIS — E785 Hyperlipidemia, unspecified: Secondary | ICD-10-CM

## 2014-07-21 DIAGNOSIS — I472 Ventricular tachycardia, unspecified: Secondary | ICD-10-CM

## 2014-07-21 DIAGNOSIS — I25812 Atherosclerosis of bypass graft of coronary artery of transplanted heart without angina pectoris: Secondary | ICD-10-CM

## 2014-07-21 MED ORDER — PANTOPRAZOLE SODIUM 40 MG PO TBEC: 40.0000 mg | DELAYED_RELEASE_TABLET | Freq: Every day | ORAL | Status: DC

## 2014-07-21 MED ORDER — FUROSEMIDE 20 MG PO TABS: 20.0000 mg | ORAL_TABLET | Freq: Every day | ORAL | Status: DC

## 2014-07-21 NOTE — Patient Instructions (Signed)
   Begin Lasix 20mg  daily - new 90 day sent to [facility]  Remain off of the Spironolactone for now. Continue all other medications.   Protonix refill sent to [facility] for 90 day. Lab for BMET - due in about 5 days Office will contact with results via phone or letter.   Follow up in  3 months

## 2014-07-21 NOTE — Progress Notes (Signed)
Patient ID: Adam House, male   DOB: 1951/07/27, 63 y.o.   MRN: [id number]      SUBJECTIVE: The patient returns for follow-up after undergoing right femoral embolectomy and fasciotomy by Dr. Doren Custard on 06/24/2014. Warfarin was initiated. His past medical history is also significant for CAD, ischemic cardiomyopathy with EF of 20-25% s/p ICD implantation, chronic systolic heart failure, PVD, hypertension, prior CVA, and interstitial lung disease (followed by Dr Chase Caller). He has not undergone VATS yet.  While he was hospitalized, he developed ventricular tachycardia that was predominately pace terminated. Sotalol was added and he did well without recurrent arrhythmias. He was discharged on sotalol 80mg  twice daily and metoprolol 100mg  daily.   He presented back to the [facility] ER on 07/07/14 after sustaining an ICD shock. He had recurrent VT in the ER that failed to terminate with ATP but terminated with shock. He was admitted for further evaluation. Initially, IV amiodarone was started in addition to his sotalol. He was evaluated by Electrophysiology. Given his interstitial lung disease, there was concern about the risks of amiodarone long term. Therefore, it was decided to try optimization of sotalol first. Amiodarone was discontinued and sotalol was increased to 160 mg BID. Metoprolol 100 mg daily was continued. He tolerated the increase in sotalol well. QT interval remained stable. No further VT was noted.  Also, on admit, he was noted to be mildly volume overloaded and was treated with Lasix, spironolactone, losartan and metoprolol. He diuresed back to a euvolemic state, but developed acute on chronic renal failure with SCr increasing from 1.4 to 1.97. His Lasix, spirolactone and ARB were placed on hold and Scr started to improve back towards baseline. On discharge, low dose losartan was restarted. However, it was recommended that his Lasix and spironolactone continue to be held until office  follow-up.   Echocardiogram on 06/27/14 demonstrated EF 20-25% with diffuse hypokinesis and apical akinesis with mild aortic regurgitation and diastolic dysfunction.  He is gradually regaining his strength. He denies chest pain. He is anxious about having another shock from his defibrillator, particularly when he begins talking and his heart rate picks up, as well as when his breathing becomes labored. He denies leg swelling. His right leg is bandaged. He denies orthopnea and paroxysmal nocturnal dyspnea. He denies dizziness, palpitations and syncope.  He is here with his wife, who works in the [place]. His father was also recently hospitalized.   He has been a [employer] fan since the mid-60's.   Review of Systems: As per "subjective", otherwise negative.  Allergies  Allergen Reactions  . Lisinopril Anaphylaxis and Swelling    angioedema  . Naproxen Hives    Current Outpatient Prescriptions  Medication Sig Dispense Refill  . albuterol (PROVENTIL HFA;VENTOLIN HFA) 108 (90 BASE) MCG/ACT inhaler Inhale 2 puffs into the lungs every 6 (six) hours as needed for wheezing or shortness of breath. For shortness of breath    . allopurinol (ZYLOPRIM) 100 MG tablet Take 100 mg by mouth daily.    [name] aspirin EC 81 MG tablet Take 1 tablet (81 mg total) by mouth daily.    . colchicine 0.6 MG tablet Take 1 tablet (0.6 mg total) by mouth daily as needed. For gout 30 tablet 11  . digoxin (LANOXIN) 0.125 MG tablet Take 0.125 mg by mouth daily.    [name] ezetimibe (ZETIA) 10 MG tablet Take 1 tablet (10 mg total) by mouth daily. 28 tablet 0  . fish oil-omega-3 fatty acids 1000 MG  capsule Take 2 g by mouth daily.    . isosorbide dinitrate (DILATRATE-SR) 40 MG CR capsule Take 1 capsule (40 mg total) by mouth 3 (three) times daily. 90 capsule 6  . losartan (COZAAR) 25 MG tablet Take 1 tablet (25 mg total) by mouth daily. 30 tablet 6  . metoprolol succinate (TOPROL XL) 100 MG 24 hr tablet Take 1 tablet  (100 mg total) by mouth daily. Take with or immediately following a meal. 30 tablet 0  . multivitamin (ONE-A-DAY MEN'S) TABS Take 1 tablet by mouth daily.    . nitroGLYCERIN (NITROSTAT) 0.4 MG SL tablet Place 1 tablet (0.4 mg total) under the tongue every 5 (five) minutes as needed for chest pain (up to 3 doses).    [name] oxyCODONE (ROXICODONE) 5 MG immediate release tablet Take 1 tablet (5 mg total) by mouth every 6 (six) hours as needed. 30 tablet 0  . pantoprazole (PROTONIX) 40 MG tablet Take 1 tablet (40 mg total) by mouth as needed. 30 tablet 6  . potassium chloride SA (KLOR-CON M20) 20 MEQ tablet Take 1 tablet (20 mEq total) by mouth daily. 30 tablet 6  . sotalol (BETAPACE) 160 MG tablet Take 1 tablet (160 mg total) by mouth 2 (two) times daily. 60 tablet 5  . warfarin (COUMADIN) 5 MG tablet Take 1 tablet (5 mg total) by mouth daily. 30 tablet 1   No current facility-administered medications for this visit.    Past Medical History  Diagnosis Date  . CAD (coronary artery disease)     a. ant MI 1998 with PCI to LAD. b. Ant-lat MI 04/2012 tx with overlapping DES to LAD.  [name] Systolic CHF     NYHA Class II/III  . Ischemic cardiomyopathy     EF 25-30% by echo 04/2012; [place]  . CRI (chronic renal insufficiency)   . Unspecified essential hypertension   . Impotence of organic origin   . Esophageal reflux   . Tobacco use disorder   . ICD (implantable cardiac defibrillator) in place   . Hypercholesteremia   . Heart murmur   . CVA (cerebral infarction) 11/2010  . Gout   . 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 Lung disease, interstitial     Past Surgical History  Procedure Laterality Date  . Anterior cervical decomp/discectomy fusion  2007    "put in 4 screws to hold my head on; ruptured C5; job related injury" (08/24/2012)  . Cardiac defibrillator placement  2011    [employer]  he is a MADIT RIT study patient  . Coronary angioplasty with stent placement  05/1997; 04/2012      "1 + 2; total of 3" (08/24/2012)  . Embolectomy Right 06/24/2014    Procedure: RIGHT FEMORAL EMBOLECTOMY,  ;  Surgeon: Angelia Mould, MD;  Location: [place] [facility];  Service: Vascular;  Laterality: Right;  Right Femoral Embolectomy, with bovine patch angioplasty,.  . Fasciotomy Left 06/24/2014    Procedure: FASCIOTOMY;  Surgeon: Angelia Mould, MD;  Location: [place];  Service: Vascular;  Laterality: Left;  four compartment Fasciotomy.    History   Social History  . Marital Status: Married    Spouse Name: N/A    Number of Children: N/A  . Years of Education: N/A   Occupational History  . RETIRED    Social History Main Topics  . Smoking status: Former Smoker -- 0.50 packs/day for 40 years    Types: Cigarettes    Quit date: 02/13/2014  .  Smokeless tobacco: Current User    Types: Snuff  . Alcohol Use: No     Comment: 08/24/2012 "used to drink; not anymore; stopped > 26yrs ago; never had any problems w/it"  . Drug Use: Yes    Special: Marijuana     Comment: 08/24/2012 "last drug use >4-5 months ago"  . Sexual Activity: Yes   Other Topics Concern  . Not on file   Social History Narrative   Last updated: 03/15/2010   Retired and lives in [place].  Previously worked for [employer].    Tobacco Use - 1PPD x 40+ years,  has quit x 3 days   ETOH- rare   Drugs- occasional marijuana             PHYSICAL EXAM General: NAD HEENT: Normal. Neck: No JVD, no thyromegaly. Lungs: Diminished but clear, no rales or wheezes. CV: Regular rate and rhythm, normal S1/S2, no S3/S4, no murmur. No pretibial or periankle edema of left leg, right leg bandaged.  No carotid bruit.  Normal pedal pulses on left.  Abdomen: Soft, nontender, no hepatosplenomegaly, no distention.  Neurologic: Alert and oriented.  Psych: Normal affect. Skin: Normal. Extremities: No clubbing or cyanosis.   ECG: Most recent ECG reviewed.     ASSESSMENT AND PLAN: 1. CAD: Stable ischemic heart disease  with severe LV dysfunction. Anterolateral STEMI in 8/13 with overlapping DES to LAD. No longer on ASA (due to being on warfarin) or Crestor (due to elevated CK). Continue sotalol 160 mg bid and Toprol-XL 100 mg daily as per EP for VT prophylaxis.  2. Chronic systolic CHF with ICD: Appears euvolemic. He is not on ACEI due to history of angioedema. Will restart low dose Lasix 20 mg daily and check BMET in 5 days. Continue supplemental KCl. Will hold off on spironolactone due low normal BP and recent renal insufficiency while hospitalized. Will continue metoprolol, losartan, digoxin, and isosorbide dinitrate.  3. Hyperlipidemia: LDL 107 (had been on Crestor 40 mg but stopped due to elevated CK by Dr. Chase Caller, and now on Zetia) with CAD. He has already been shown to be a candidate for PCSK-9 inhibitors. I would consider starting Praluent. Will need repeat lipids. 4. Interstitial lung disease with history of tobacco abuse: He has now quit. Followed by pulmonary. Has not undergone VATS yet. 5. Carotid stenosis: Chronic RICA occlusion with minimal LICA disease (6-71% in 09/2013). No longer on ASA or statin. On Zetia and warfarin. He is followed by vascular. 6. CKD stage 3: 07/11/14-BUN 37, cre 1.67, GFR 49 ml/min. Will check BMET in one week as I am resuming Lasix. 7. PVD: Followed by vascular. On warfarin. 8. Ventricular tachycardia: Continue sotalol 160 mg bid and Toprol-XL 100 mg daily as per EP for VT prophylaxis. Due to f/u with Dr. Rayann Heman.  Dispo: f/u 3 months.   Kate Sable, M.D., F.A.C.C.

## 2014-07-25 ENCOUNTER — Other Ambulatory Visit: Payer: Self-pay | Admitting: Cardiovascular Disease

## 2014-07-25 NOTE — Telephone Encounter (Signed)
[place] cant do RX call into [facility]   [phone]

## 2014-07-26 ENCOUNTER — Telehealth: Payer: Self-pay | Admitting: Internal Medicine

## 2014-07-26 MED ORDER — PANTOPRAZOLE SODIUM 40 MG PO TBEC: 40.0000 mg | DELAYED_RELEASE_TABLET | Freq: Every day | ORAL | Status: DC

## 2014-07-26 NOTE — Telephone Encounter (Signed)
Called and spoke to pt. Appt made for 09/22/14 with MR. Pt verbalized understanding and denied any further questions or concerns at this time.

## 2014-07-26 NOTE — Telephone Encounter (Signed)
Please give him first avail jan 2016 is fine: to see me to discuss his ILD after he refused VATS bx  [place]  Dr. Brand Males, M.D., [facility].C.P Pulmonary and Critical Care Medicine Staff Physician [facility] Pager: [phone], If no answer or between  15:00h - 7:00h: call [phone]  07/26/2014 9:36 AM

## 2014-07-28 NOTE — Discharge Summary (Signed)
Physician Discharge Summary  Patient ID: Adam House MRN: [id number] DOB/AGE: [birth year] 63 y.o.  Primary Cardiologist: Dr. Bronson Ing Primary Electrophysiologist: Dr. Rayann Heman  Admit date: 07/07/2014 Discharge date: 07/11/2014  Admission Diagnoses: Ventricular Tachycardia  Discharge Diagnoses:  Active Problems:  VT (ventricular tachycardia)   Discharged Condition: stable  Hospital Course: Adam House is a 63 y.o. male with a past medical history significant for CAD, ischemic cardiomyopathy with EF of 20-25% s/p ICD implantation, systolic heart failure, PVD, hypertension, prior CVA, and interstitial lung disease (followed by Dr Chase Caller pending VATS with Roxan Hockey).   He was recently admitted on 10-10 - 07/03/14 with acute thromboembolism of right foot and underwent thrombectomy and fasciotomy at that time. He developed ventricular tachycardia that was predominately pace terminated. Sotalol was added and he did well without recurrent arrhythmias. He was discharged on Sotalol 80mg  twice daily and Metoprolol 100mg  daily.   He presented back to the [facility] ER on 07/07/14 after sustaining an ICD shock. He had recurrent VT in the ER that failed to terminate with ATP but terminated with HV shock. He was admitted for further evaluation. Initially, IV Amiodarone was started in addition to his sotalol. He was evaluated by Electrophysiology. Given his interstitial lung disease, there was concern about risks of amiodarone long term. Therefore, it was decided to try optimization of sotalol first. Amiodarone was discontinued and sotalol was increased to 160 mg BID. Metoprolol 100 mg daily was continued. He tolerated the increase in sotalol well. QT interval remained stable. No further VT was noted.  Also, on admit, he was noted to be mildly volume overloaded and was treated with Lasix, spironolactone, losartan and metoprolol. He diuresed back to a euvolemic state, but developed acute on  chronic renal failure with SCr increasing from 1.4 to 1.97. His Lasix, spirolactone and ARB were placed on hold and Scr started to improve back towards baseline. On discharge, low dose losartan was restarted. However, it was recommended that his Lasix and spirolactone continue to be held until office follow-up.   During this admission, Vascular Surgery was also consulted for management/ recommendations regarding his fasciotomy. Recommendations were for continuation of wet to dry dressings. WOC assisted with dressing changes.   On 07/11/14, he was examined by Dr. Rayann Heman, who felt that he was stable for discharge home. Transition of care follow-up has been arranged with his primary cardiologist, Dr. Bronson Ing, in [place] on 07/21/14. He is scheduled for EP follow-up with Dr. Rayann Heman, in [place], on 08/14/14.     Consults: vascular surgery  Significant Diagnostic Studies:    Treatments: See Hospital Course  Discharge Exam: Blood pressure 115/67, pulse 59, temperature 97.6 F (36.4 C), temperature source Oral, resp. rate 18, height 5\' 9"  (1.753 m), weight 183 lb (83.008 kg), SpO2 100.00%.   Disposition: 06-Home-Health Care Svc     Medication List    STOP taking these medications      clopidogrel 75 MG tablet  Commonly known as: PLAVIX     furosemide 40 MG tablet  Commonly known as: LASIX     spironolactone 25 MG tablet  Commonly known as: ALDACTONE      TAKE these medications      albuterol 108 (90 BASE) MCG/ACT inhaler  Commonly known as: PROVENTIL HFA;VENTOLIN HFA  Inhale 2 puffs into the lungs every 6 (six) hours as needed for wheezing or shortness of breath. For shortness of breath     allopurinol 100 MG tablet  Commonly known as: ZYLOPRIM  Take 100  mg by mouth daily.     aspirin EC 81 MG tablet  Take 1 tablet (81 mg total) by mouth daily.     colchicine 0.6 MG tablet  Take 1 tablet (0.6 mg total) by mouth daily as needed. For  gout     digoxin 0.125 MG tablet  Commonly known as: LANOXIN  Take 0.125 mg by mouth daily.     ezetimibe 10 MG tablet  Commonly known as: ZETIA  Take 1 tablet (10 mg total) by mouth daily.     fish oil-omega-3 fatty acids 1000 MG capsule  Take 2 g by mouth daily.     isosorbide dinitrate 40 MG CR capsule  Commonly known as: DILATRATE-SR  Take 1 capsule (40 mg total) by mouth 3 (three) times daily.     losartan 25 MG tablet  Commonly known as: COZAAR  Take 1 tablet (25 mg total) by mouth daily.     metoprolol succinate 100 MG 24 hr tablet  Commonly known as: TOPROL XL  Take 1 tablet (100 mg total) by mouth daily. Take with or immediately following a meal.     multivitamin Tabs tablet  Take 1 tablet by mouth daily.     nitroGLYCERIN 0.4 MG SL tablet  Commonly known as: NITROSTAT  Place 1 tablet (0.4 mg total) under the tongue every 5 (five) minutes as needed for chest pain (up to 3 doses).     oxyCODONE 5 MG immediate release tablet  Commonly known as: ROXICODONE  Take 1 tablet (5 mg total) by mouth every 6 (six) hours as needed.     pantoprazole 40 MG tablet  Commonly known as: PROTONIX  Take 1 tablet (40 mg total) by mouth as needed.     potassium chloride SA 20 MEQ tablet  Commonly known as: KLOR-CON M20  Take 1 tablet (20 mEq total) by mouth daily.     sotalol 160 MG tablet  Commonly known as: BETAPACE  Take 1 tablet (160 mg total) by mouth 2 (two) times daily.     warfarin 5 MG tablet  Commonly known as: COUMADIN  Take 1 tablet (5 mg total) by mouth daily.       Follow-up Information   Follow up with Herminio Commons, MD On 07/21/2014. (3:40 pm)    Specialty: Cardiology   Contact information:   [place] [address] [phone]       Follow up with Thompson Grayer, MD On 08/14/2014. (11:30 am )    Specialty: Cardiology   Contact  information:   [place] [address] [address], INCLUDING PHYSICIAN TIME: >30 MINUTES  Signed: Lyda Jester 07/11/2014, 12:07 PM  Thompson Grayer MD

## 2014-07-31 ENCOUNTER — Encounter: Payer: Self-pay | Admitting: *Deleted

## 2014-07-31 ENCOUNTER — Other Ambulatory Visit: Payer: Self-pay | Admitting: *Deleted

## 2014-07-31 DIAGNOSIS — I10 Essential (primary) hypertension: Secondary | ICD-10-CM

## 2014-07-31 DIAGNOSIS — N182 Chronic kidney disease, stage 2 (mild): Secondary | ICD-10-CM

## 2014-08-01 ENCOUNTER — Ambulatory Visit (INDEPENDENT_AMBULATORY_CARE_PROVIDER_SITE_OTHER): Payer: BC Managed Care – PPO | Admitting: *Deleted

## 2014-08-01 DIAGNOSIS — I824Y1 Acute embolism and thrombosis of unspecified deep veins of right proximal lower extremity: Secondary | ICD-10-CM

## 2014-08-01 LAB — POCT INR
INR: 3.5
INR: 3.5

## 2014-08-13 ENCOUNTER — Telehealth: Payer: Self-pay | Admitting: Internal Medicine

## 2014-08-13 ENCOUNTER — Encounter: Payer: Self-pay | Admitting: Internal Medicine

## 2014-08-13 NOTE — Telephone Encounter (Signed)
Received a call from [facility] ER. Patient with single ICD firing today. They are having [employer] rep interrogate device and then will send home. Please arrange EP f/u with device clinic this week. He follows with Dr. Rayann Heman.    Thanks.

## 2014-08-14 ENCOUNTER — Ambulatory Visit (INDEPENDENT_AMBULATORY_CARE_PROVIDER_SITE_OTHER): Payer: BC Managed Care – PPO | Admitting: *Deleted

## 2014-08-14 ENCOUNTER — Encounter: Payer: Self-pay | Admitting: Internal Medicine

## 2014-08-14 ENCOUNTER — Ambulatory Visit (INDEPENDENT_AMBULATORY_CARE_PROVIDER_SITE_OTHER): Payer: BC Managed Care – PPO | Admitting: Internal Medicine

## 2014-08-14 VITALS — BP 114/74 | HR 74 | Ht 69.0 in | Wt 177.0 lb

## 2014-08-14 DIAGNOSIS — I824Y1 Acute embolism and thrombosis of unspecified deep veins of right proximal lower extremity: Secondary | ICD-10-CM

## 2014-08-14 DIAGNOSIS — I519 Heart disease, unspecified: Secondary | ICD-10-CM | POA: Diagnosis not present

## 2014-08-14 DIAGNOSIS — I472 Ventricular tachycardia, unspecified: Secondary | ICD-10-CM

## 2014-08-14 DIAGNOSIS — I5022 Chronic systolic (congestive) heart failure: Secondary | ICD-10-CM | POA: Diagnosis not present

## 2014-08-14 DIAGNOSIS — I824Y9 Acute embolism and thrombosis of unspecified deep veins of unspecified proximal lower extremity: Secondary | ICD-10-CM

## 2014-08-14 LAB — MDC_IDC_ENUM_SESS_TYPE_INCLINIC
HIGH POWER IMPEDANCE MEASURED VALUE: 45 Ohm
HIGH POWER IMPEDANCE MEASURED VALUE: 62 Ohm
Implantable Pulse Generator Serial Number: 147011
Lead Channel Impedance Value: 581 Ohm
Lead Channel Pacing Threshold Amplitude: 0.6 V
Lead Channel Pacing Threshold Amplitude: 0.9 V
Lead Channel Pacing Threshold Pulse Width: 0.4 ms
Lead Channel Setting Pacing Amplitude: 2 V
Lead Channel Setting Pacing Pulse Width: 0.4 ms
MDC IDC MSMT LEADCHNL RA SENSING INTR AMPL: 10.7 mV
MDC IDC MSMT LEADCHNL RV IMPEDANCE VALUE: 503 Ohm
MDC IDC MSMT LEADCHNL RV PACING THRESHOLD PULSEWIDTH: 0.4 ms
MDC IDC MSMT LEADCHNL RV SENSING INTR AMPL: 13.3 mV
MDC IDC SESS DTM: 20151130050000
MDC IDC SET LEADCHNL RV PACING AMPLITUDE: 2.4 V
MDC IDC SET LEADCHNL RV SENSING SENSITIVITY: 0.6 mV
Zone Setting Detection Interval: 240 ms
Zone Setting Detection Interval: 300 ms
Zone Setting Detection Interval: 353 ms

## 2014-08-14 LAB — POCT INR: INR: 1.6

## 2014-08-14 MED ORDER — SOTALOL HCL 80 MG PO TABS: 120.0000 mg | ORAL_TABLET | Freq: Two times a day (BID) | ORAL | Status: DC

## 2014-08-14 NOTE — Patient Instructions (Signed)
Your physician recommends that you schedule a follow-up appointment in: 4 weeks with Dr. Rayann Heman. Your physician has recommended you make the following change in your medication:  STOP FUROSEMIDE 40 MG. STOP SPIRONOLACTONE 25 MG. STOP CLOPIDOGREL 75 MG. INCREASE SOTALOL TO 120 MG TWICE DAILY.  1 &1/2 TABLETS OF 80 MG TWICE DAILY. TAKE POTASSIUM CHLORIDE 40 MEQ FOR 3 DAYS. THIS IS 2 OF YOUR 20 MEQ TABLETS. THEN RESUME 1 DAILY. CONTINUE ALL OTHER MEDICATIONS THE SAME. Your physician recommends that you have lab work on Friday 08/18/14 to check your BMET.

## 2014-08-15 ENCOUNTER — Encounter: Payer: Self-pay | Admitting: Vascular Surgery

## 2014-08-16 ENCOUNTER — Ambulatory Visit (INDEPENDENT_AMBULATORY_CARE_PROVIDER_SITE_OTHER): Payer: Self-pay | Admitting: Vascular Surgery

## 2014-08-16 ENCOUNTER — Encounter: Payer: Self-pay | Admitting: Vascular Surgery

## 2014-08-16 VITALS — BP 117/64 | HR 65 | Ht 69.0 in | Wt 179.5 lb

## 2014-08-16 DIAGNOSIS — Z48812 Encounter for surgical aftercare following surgery on the circulatory system: Secondary | ICD-10-CM

## 2014-08-16 NOTE — Progress Notes (Signed)
PCP:  FANTA,[name], MD Primary Cardiologist:  Dr Bronson Ing  The patient presents today for electrophysiology followup.  He continues to have difficult with VT.  He most recently received an ICD shock 11/29 for which he presented to [facility], he has been confused and multiple medicines are not correct.  He is only taking sotalol 80mg  BID (instructions were for 160mg  BID).  He is making slow recovery from his recent surgery.  His leg is healing.  He does not have much pain. He is very anxious about further ICD shocks. Today, he denies symptoms of palpitations, chest pain, shortness of breath, orthopnea, PND, lower extremity edema, dizziness, presyncope, syncope, or neurologic sequela.  The patient feels that he is tolerating medications without difficulties and is otherwise without complaint today.   Past Medical History  Diagnosis Date  . CAD (coronary artery disease)     a. ant MI 1998 with PCI to LAD. b. Ant-lat MI 04/2012 tx with overlapping DES to LAD.  [name] Systolic CHF     NYHA Class II/III  . Ischemic cardiomyopathy     EF 25-30% by echo 04/2012; [place]  . CRI (chronic renal insufficiency)   . Unspecified essential hypertension   . Impotence of organic origin   . Esophageal reflux   . Tobacco use disorder   . ICD (implantable cardiac defibrillator) in place   . Hypercholesteremia   . Heart murmur   . CVA (cerebral infarction) 11/2010  . Gout   . 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 Lung disease, interstitial    Past Surgical History  Procedure Laterality Date  . Anterior cervical decomp/discectomy fusion  2007    "put in 4 screws to hold my head on; ruptured C5; job related injury" (08/24/2012)  . Cardiac defibrillator placement  2011    [employer]  he is a MADIT RIT study patient  . Coronary angioplasty with stent placement  05/1997; 04/2012    "1 + 2; total of 3" (08/24/2012)  . Embolectomy Right 06/24/2014    Procedure: RIGHT FEMORAL  EMBOLECTOMY,  ;  Surgeon: Angelia Mould, MD;  Location: [facility] OR;  Service: Vascular;  Laterality: Right;  Right Femoral Embolectomy, with bovine patch angioplasty,.  . Fasciotomy Left 06/24/2014    Procedure: FASCIOTOMY;  Surgeon: Angelia Mould, MD;  Location: [place];  Service: Vascular;  Laterality: Left;  four compartment Fasciotomy.    Current Outpatient Prescriptions  Medication Sig Dispense Refill  . albuterol (PROVENTIL HFA;VENTOLIN HFA) 108 (90 BASE) MCG/ACT inhaler Inhale 2 puffs into the lungs every 6 (six) hours as needed for wheezing or shortness of breath. For shortness of breath    . colchicine 0.6 MG tablet Take 1 tablet (0.6 mg total) by mouth daily as needed. For gout 30 tablet 11  . digoxin (LANOXIN) 0.125 MG tablet Take 0.125 mg by mouth daily.    . furosemide (LASIX) 20 MG tablet Take 1 tablet by mouth daily.    [name] HYDROcodone-acetaminophen (NORCO/VICODIN) 5-325 MG per tablet Take 1 tablet by mouth 2 (two) times daily.    . isosorbide dinitrate (DILATRATE-SR) 40 MG CR capsule Take 1 capsule (40 mg total) by mouth 3 (three) times daily. 90 capsule 6  . losartan (COZAAR) 25 MG tablet Take 1 tablet (25 mg total) by mouth daily. 30 tablet 6  . metoprolol succinate (TOPROL XL) 100 MG 24 hr tablet Take 1 tablet (100 mg total) by mouth daily. Take with or  immediately following a meal. 30 tablet 0  . nitroGLYCERIN (NITROSTAT) 0.4 MG SL tablet Place 1 tablet (0.4 mg total) under the tongue every 5 (five) minutes as needed for chest pain (up to 3 doses).    . pantoprazole (PROTONIX) 40 MG tablet Take 1 tablet (40 mg total) by mouth daily. 90 tablet 3  . potassium chloride SA (KLOR-CON M20) 20 MEQ tablet Take 1 tablet (20 mEq total) by mouth daily. 30 tablet 6  . sotalol (BETAPACE) 80 MG tablet Take 1.5 tablets (120 mg total) by mouth 2 (two) times daily. 90 tablet 3  . warfarin (COUMADIN) 5 MG tablet Take 1 tablet (5 mg total) by mouth daily. 30 tablet 1   No current  facility-administered medications for this visit.    Allergies  Allergen Reactions  . Lisinopril Anaphylaxis and Swelling    angioedema  . Naproxen Hives    History   Social History  . Marital Status: Married    Spouse Name: N/A    Number of Children: N/A  . Years of Education: N/A   Occupational History  . RETIRED    Social History Main Topics  . Smoking status: Former Smoker -- 0.50 packs/day for 40 years    Types: Cigarettes    Start date: 12/20/1983    Quit date: 02/13/2014  . Smokeless tobacco: Current User    Types: Snuff  . Alcohol Use: No     Comment: 08/24/2012 "used to drink; not anymore; stopped > 52yrs ago; never had any problems w/it"  . Drug Use: Yes    Special: Marijuana     Comment: 08/24/2012 "last drug use >4-5 months ago"  . Sexual Activity: Yes   Other Topics Concern  . Not on file   Social History Narrative   Last updated: 03/15/2010   Retired and lives in [place].  Previously worked for [employer].    Tobacco Use - 1PPD x 40+ years,  has quit x 3 days   ETOH- rare   Drugs- occasional marijuana           Family History  Problem Relation Age of Onset  . Diabetes Mother   . Heart disease Father     ROS-  All systems are reviewed and are negative except as outlined in the HPI above  Physical Exam: Filed Vitals:   08/14/14 1144  BP: 114/74  Pulse: 74  Height: 5\' 9"  (1.753 m)  Weight: 177 lb (80.287 kg)  SpO2: 97%    GEN- The patient is well appearing, alert and oriented x 3 today.   Head- normocephalic, atraumatic Eyes-  Sclera clear, conjunctiva pink Ears- hearing intact Oropharynx- clear Neck- supple, no JVP Lymph- no cervical lymphadenopathy Lungs- Clear to ausculation bilaterally, normal work of breathing Heart- Regular rate and rhythm, no murmurs, rubs or gallops, PMI not laterally displaced GI- soft, NT, ND, + BS Extremities- no clubbing, cyanosis, or edema MS- no significant deformity or atrophy Skin- leg dressing  is c/d/i Psych- euthymic mood, full affect Neuro- strength and sensation are intact  ICD interrogation is reviewed (see paceart) ekg is reviewed- qtc is stable  Assessment and Plan:  1. VT He is not taking sotalol as directed Today, I will increase sotalol to 120mg  BID.  Consider increasing further if needed in the future Given renal failure, will need to be cautious No driving Continue toprol  2. Chronic systolic dysfuntion Appears dry on exam Was taking lasix 60mg  BID (though Dr Bronson Ing has instructed 20mg   daily) Will decrease to 20mg  daily Increase K supplement and repeat bmet in 1 week

## 2014-08-16 NOTE — Progress Notes (Signed)
   Patient name: Adam House MRN: [id number] DOB: [birth year] Sex: male  REASON FOR VISIT: Follow up after right femoral embolectomy and fasciotomy.  HPI: Adam House is a 63 y.o. male who presented with an ischemic right lower extremity on 06/24/2014. He underwent right femoral embolectomy, bovine pericardial patch angioplasty of the right common femoral artery, and 4 compartment fasciotomy. He comes in for a follow up visit. He has no specific complaints except that he was in the hospital after his defibrillator fired. He denies claudication of his right leg. His wife has been doing his dressing changes to his lateral fasciotomy site.  REVIEW OF SYSTEMS: Valu.Nieves ] denotes positive finding; [  ] denotes negative finding  CARDIOVASCULAR:  [ ]  chest pain   [ ]  dyspnea on exertion    CONSTITUTIONAL:  [ ]  fever   [ ]  chills  PHYSICAL EXAM: Filed Vitals:   08/16/14 0918  BP: 117/64  Pulse: 65  Height: 5\' 9"  (1.753 m)  Weight: 179 lb 8 oz (81.421 kg)  SpO2: 98%   Body mass index is 26.5 kg/(m^2). GENERAL: The patient is a well-nourished male, in no acute distress. The vital signs are documented above. CARDIOVASCULAR: There is a regular rate and rhythm. PULMONARY: There is good air exchange bilaterally without wheezing or rales. He has a palpable dorsalis pedis pulse on the right. The lateral fasciotomy site is granulating nicely and measures a maximum of 15 mm in width and 10 cm in length.  MEDICAL ISSUES: The patient is doing well status post right femoral embolectomy and fasciotomy. He will continue the dressing changes to the fasciotomy site. He is on Coumadin. I will see him back in 2 months to make sure that the wound has healed. After that we can follow him likely at 1 year intervals.  [facility] Beeper: [phone]

## 2014-08-24 ENCOUNTER — Encounter (HOSPITAL_COMMUNITY): Payer: Self-pay | Admitting: Cardiology

## 2014-08-29 ENCOUNTER — Other Ambulatory Visit (HOSPITAL_COMMUNITY): Payer: PRIVATE HEALTH INSURANCE

## 2014-08-29 ENCOUNTER — Ambulatory Visit: Payer: PRIVATE HEALTH INSURANCE | Admitting: Vascular Surgery

## 2014-08-29 ENCOUNTER — Ambulatory Visit (INDEPENDENT_AMBULATORY_CARE_PROVIDER_SITE_OTHER): Payer: BC Managed Care – PPO | Admitting: *Deleted

## 2014-08-29 DIAGNOSIS — I824Y1 Acute embolism and thrombosis of unspecified deep veins of right proximal lower extremity: Secondary | ICD-10-CM

## 2014-08-29 LAB — POCT INR: INR: 1.6

## 2014-09-09 ENCOUNTER — Encounter: Payer: Self-pay | Admitting: Internal Medicine

## 2014-09-11 ENCOUNTER — Telehealth: Payer: Self-pay | Admitting: *Deleted

## 2014-09-11 ENCOUNTER — Ambulatory Visit: Payer: BC Managed Care – PPO | Admitting: *Deleted

## 2014-09-11 DIAGNOSIS — I5022 Chronic systolic (congestive) heart failure: Secondary | ICD-10-CM

## 2014-09-11 DIAGNOSIS — I472 Ventricular tachycardia, unspecified: Secondary | ICD-10-CM

## 2014-09-11 NOTE — Telephone Encounter (Signed)
Pt had episode on 09-08-14 around 1202. SK reviewed egm. EGM showed VT with ATP and then shock. Pt was fully aware of episode. Pt aware of no driving x 6 mths. Pt scheduled for 09-22-14 with JA.

## 2014-09-11 NOTE — Progress Notes (Deleted)
Remote defibrillator check.

## 2014-09-12 ENCOUNTER — Ambulatory Visit (INDEPENDENT_AMBULATORY_CARE_PROVIDER_SITE_OTHER): Payer: BC Managed Care – PPO | Admitting: *Deleted

## 2014-09-12 DIAGNOSIS — I824Y1 Acute embolism and thrombosis of unspecified deep veins of right proximal lower extremity: Secondary | ICD-10-CM

## 2014-09-12 LAB — POCT INR: INR: 2

## 2014-09-22 ENCOUNTER — Encounter: Payer: Self-pay | Admitting: Internal Medicine

## 2014-09-22 ENCOUNTER — Ambulatory Visit (INDEPENDENT_AMBULATORY_CARE_PROVIDER_SITE_OTHER): Payer: BC Managed Care – PPO | Admitting: Internal Medicine

## 2014-09-22 ENCOUNTER — Ambulatory Visit: Payer: BC Managed Care – PPO | Admitting: Internal Medicine

## 2014-09-22 VITALS — BP 122/78 | HR 60 | Ht 69.0 in | Wt 184.0 lb

## 2014-09-22 DIAGNOSIS — R0689 Other abnormalities of breathing: Secondary | ICD-10-CM

## 2014-09-22 DIAGNOSIS — I10 Essential (primary) hypertension: Secondary | ICD-10-CM

## 2014-09-22 DIAGNOSIS — I472 Ventricular tachycardia, unspecified: Secondary | ICD-10-CM

## 2014-09-22 DIAGNOSIS — R0600 Dyspnea, unspecified: Secondary | ICD-10-CM

## 2014-09-22 DIAGNOSIS — I255 Ischemic cardiomyopathy: Secondary | ICD-10-CM

## 2014-09-22 DIAGNOSIS — I5023 Acute on chronic systolic (congestive) heart failure: Secondary | ICD-10-CM

## 2014-09-22 LAB — MDC_IDC_ENUM_SESS_TYPE_INCLINIC
Implantable Pulse Generator Serial Number: 147011
Lead Channel Setting Pacing Amplitude: 2 V
Lead Channel Setting Pacing Amplitude: 2.4 V
Lead Channel Setting Pacing Pulse Width: 0.4 ms
Lead Channel Setting Sensing Sensitivity: 0.6 mV
MDC IDC SET ZONE DETECTION INTERVAL: 240 ms
MDC IDC SET ZONE DETECTION INTERVAL: 300 ms
Zone Setting Detection Interval: 353 ms

## 2014-09-22 MED ORDER — SOTALOL HCL 80 MG PO TABS: 160.0000 mg | ORAL_TABLET | Freq: Two times a day (BID) | ORAL | Status: DC

## 2014-09-22 NOTE — Progress Notes (Signed)
PCP:  FANTA,[name], MD Primary Cardiologist:  Dr Bronson Ing  The patient presents today for electrophysiology followup.  He had an ICD shock 12/25 and 12/26.  These occurred at rest.  He reports compliance with home medicines. He is very anxious about further ICD shocks. Today, he denies symptoms of palpitations, chest pain,  orthopnea, PND, lower extremity edema, dizziness, presyncope, syncope, or neurologic sequela.  He is making improvement but continues to have SOB.  The patient feels that he is tolerating medications without difficulties and is otherwise without complaint today.   Past Medical History  Diagnosis Date  . CAD (coronary artery disease)     a. ant MI 1998 with PCI to LAD. b. Ant-lat MI 04/2012 tx with overlapping DES to LAD.  [name] Systolic CHF     NYHA Class II/III  . Ischemic cardiomyopathy     EF 25-30% by echo 04/2012; [place]  . CRI (chronic renal insufficiency)   . Unspecified essential hypertension   . Impotence of organic origin   . Esophageal reflux   . Tobacco use disorder   . ICD (implantable cardiac defibrillator) in place   . Hypercholesteremia   . Heart murmur   . CVA (cerebral infarction) 11/2010  . Gout   . 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 Lung disease, interstitial    Past Surgical History  Procedure Laterality Date  . Anterior cervical decomp/discectomy fusion  2007    "put in 4 screws to hold my head on; ruptured C5; job related injury" (08/24/2012)  . Cardiac defibrillator placement  2011    [employer]  he is a MADIT RIT study patient  . Coronary angioplasty with stent placement  05/1997; 04/2012    "1 + 2; total of 3" (08/24/2012)  . Embolectomy Right 06/24/2014    Procedure: RIGHT FEMORAL EMBOLECTOMY,  ;  Surgeon: Angelia Mould, MD;  Location: [facility] OR;  Service: Vascular;  Laterality: Right;  Right Femoral Embolectomy, with bovine patch angioplasty,.  . Fasciotomy Left 06/24/2014    Procedure: FASCIOTOMY;  Surgeon:  Angelia Mould, MD;  Location: [place];  Service: Vascular;  Laterality: Left;  four compartment Fasciotomy.  . Left heart catheterization with coronary angiogram N/A 04/22/2012    Procedure: LEFT HEART CATHETERIZATION WITH CORONARY ANGIOGRAM;  Surgeon: Leonie Man, MD;  Location: [facility] CATH LAB;  Service: Cardiovascular;  Laterality: N/A;  . Percutaneous coronary stent intervention (pci-s)  04/22/2012    Procedure: PERCUTANEOUS CORONARY STENT INTERVENTION (PCI-S);  Surgeon: Leonie Man, MD;  Location: [facility] CATH LAB;  Service: Cardiovascular;;    Current Outpatient Prescriptions  Medication Sig Dispense Refill  . albuterol (PROVENTIL HFA;VENTOLIN HFA) 108 (90 BASE) MCG/ACT inhaler Inhale 2 puffs into the lungs every 6 (six) hours as needed for wheezing or shortness of breath. For shortness of breath    . colchicine 0.6 MG tablet Take 1 tablet (0.6 mg total) by mouth daily as needed. For gout 30 tablet 11  . digoxin (LANOXIN) 0.125 MG tablet Take 0.125 mg by mouth daily.    . furosemide (LASIX) 20 MG tablet Take 1 tablet by mouth daily.    . isosorbide dinitrate (DILATRATE-SR) 40 MG CR capsule Take 1 capsule (40 mg total) by mouth 3 (three) times daily. 90 capsule 6  . losartan (COZAAR) 25 MG tablet Take 1 tablet (25 mg total) by mouth daily. 30 tablet 6  . metoprolol succinate (TOPROL XL) 100 MG 24 hr tablet Take 1 tablet (100  mg total) by mouth daily. Take with or immediately following a meal. 30 tablet 0  . nitroGLYCERIN (NITROSTAT) 0.4 MG SL tablet Place 1 tablet (0.4 mg total) under the tongue every 5 (five) minutes as needed for chest pain (up to 3 doses).    . pantoprazole (PROTONIX) 40 MG tablet Take 1 tablet (40 mg total) by mouth daily. 90 tablet 3  . potassium chloride SA (KLOR-CON M20) 20 MEQ tablet Take 1 tablet (20 mEq total) by mouth daily. 30 tablet 6  . sotalol (BETAPACE) 80 MG tablet Take 2 tablets (160 mg total) by mouth 2 (two) times daily. 120 tablet 3  . warfarin  (COUMADIN) 5 MG tablet Take 1 tablet (5 mg total) by mouth daily. 30 tablet 1   No current facility-administered medications for this visit.    Allergies  Allergen Reactions  . Lisinopril Anaphylaxis and Swelling    angioedema  . Naproxen Hives    History   Social History  . Marital Status: Married    Spouse Name: N/A    Number of Children: N/A  . Years of Education: N/A   Occupational History  . RETIRED    Social History Main Topics  . Smoking status: Former Smoker -- 0.50 packs/day for 40 years    Types: Cigarettes    Start date: 12/20/1983    Quit date: 02/13/2014  . Smokeless tobacco: Current User    Types: Snuff  . Alcohol Use: No     Comment: 08/24/2012 "used to drink; not anymore; stopped > 23yrs ago; never had any problems w/it"  . Drug Use: Yes    Special: Marijuana     Comment: 08/24/2012 "last drug use >4-5 months ago"  . Sexual Activity: Yes   Other Topics Concern  . Not on file   Social History Narrative   Last updated: 03/15/2010   Retired and lives in [place].  Previously worked for [employer].    Tobacco Use - 1PPD x 40+ years,  has quit x 3 days   ETOH- rare   Drugs- occasional marijuana           Family History  Problem Relation Age of Onset  . Diabetes Mother   . Heart disease Father     ROS-  All systems are reviewed and are negative except as outlined in the HPI above  Physical Exam: Filed Vitals:   09/22/14 0909  BP: 122/78  Pulse: 60  Height: 5\' 9"  (1.753 m)  Weight: 184 lb (83.462 kg)    GEN- The patient is well appearing, alert and oriented x 3 today.   Head- normocephalic, atraumatic Eyes-  Sclera clear, conjunctiva pink Ears- hearing intact Oropharynx- clear Neck- supple  Lungs- Clear to ausculation bilaterally, normal work of breathing Heart- Regular rate and rhythm, no murmurs, rubs or gallops, PMI not laterally displaced GI- soft, NT, ND, + BS Extremities- no clubbing, cyanosis, or edema MS- no significant  deformity or atrophy Skin- R leg has healed nicely Psych- euthymic mood, full affect Neuro- strength and sensation are intact  ICD interrogation is reviewed (see paceart) ekg is reviewed- sinus 62 bpm, PR 240, qtc is 369  Assessment and Plan:  1. VT Today, I will increase sotalol to 160mg  BID.  I would like to avoid amiodarone given his SOB and lung issues Given renal failure, will need to be cautious No driving x 6 months (pt is compliant) Continue toprol He is presently too sick for ablation. Check K, Mg, and  TSH He has a lot of anxiety related to his prior ICD shocks.  I have encouraged him to discuss consideration of medicine for anxiety with primary care.  2. Chronic systolic dysfuntion euvolemic today Continue lasix 20mg  daily bmet today  3. CRI bmet today Low dose lasix  4. Afib Maintaining sinus rhythm Continue anticoagulation  5. Lung lesion I have encouraged him to follow-up on this  Return in 4 weeks for further assessment

## 2014-09-22 NOTE — Patient Instructions (Signed)
Your physician recommends that you schedule a follow-up appointment in: 1 month. Your physician has recommended you make the following change in your medication:  Increase your sotalol to 160 mg twice daily. Continue all other medications the same. Your physician recommends that you return for have lab work today to check your BMET, DIGOXIN, MG,CBC,TSH LEVELS.

## 2014-09-26 ENCOUNTER — Ambulatory Visit (INDEPENDENT_AMBULATORY_CARE_PROVIDER_SITE_OTHER): Payer: BC Managed Care – PPO | Admitting: *Deleted

## 2014-09-26 DIAGNOSIS — I824Y1 Acute embolism and thrombosis of unspecified deep veins of right proximal lower extremity: Secondary | ICD-10-CM

## 2014-09-26 LAB — POCT INR: INR: 2.7

## 2014-09-26 MED ORDER — POTASSIUM CHLORIDE CRYS ER 20 MEQ PO TBCR: 20.0000 meq | EXTENDED_RELEASE_TABLET | Freq: Every day | ORAL | Status: DC

## 2014-09-26 MED ORDER — WARFARIN SODIUM 5 MG PO TABS: 5.0000 mg | ORAL_TABLET | Freq: Every day | ORAL | Status: AC

## 2014-09-27 ENCOUNTER — Telehealth: Payer: Self-pay | Admitting: Internal Medicine
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
## 2014-09-27 NOTE — Telephone Encounter (Signed)
Will forward to Dr. Bronson Ing as Juluis Rainier

## 2014-09-27 NOTE — Telephone Encounter (Signed)
New Message  Pt was told by lawyer to see Dr. Rayann Heman before the end of January for legal reasons; currentl yhas appt set for 2/5. Please call back and discuss.

## 2014-10-03 ENCOUNTER — Telehealth: Payer: Self-pay | Admitting: *Deleted

## 2014-10-03 NOTE — Telephone Encounter (Signed)
Patient informed. 

## 2014-10-04 ENCOUNTER — Encounter: Payer: Self-pay | Admitting: Cardiovascular Disease

## 2014-10-04 ENCOUNTER — Other Ambulatory Visit: Payer: Self-pay | Admitting: *Deleted

## 2014-10-04 ENCOUNTER — Ambulatory Visit (INDEPENDENT_AMBULATORY_CARE_PROVIDER_SITE_OTHER): Payer: BLUE CROSS/BLUE SHIELD | Admitting: Cardiovascular Disease

## 2014-10-04 VITALS — BP 102/62 | HR 62 | Ht 69.0 in | Wt 181.0 lb

## 2014-10-04 DIAGNOSIS — N183 Chronic kidney disease, stage 3 unspecified: Secondary | ICD-10-CM

## 2014-10-04 DIAGNOSIS — E785 Hyperlipidemia, unspecified: Secondary | ICD-10-CM

## 2014-10-04 DIAGNOSIS — I472 Ventricular tachycardia, unspecified: Secondary | ICD-10-CM

## 2014-10-04 DIAGNOSIS — I255 Ischemic cardiomyopathy: Secondary | ICD-10-CM

## 2014-10-04 DIAGNOSIS — I25812 Atherosclerosis of bypass graft of coronary artery of transplanted heart without angina pectoris: Secondary | ICD-10-CM

## 2014-10-04 DIAGNOSIS — I10 Essential (primary) hypertension: Secondary | ICD-10-CM

## 2014-10-04 DIAGNOSIS — J849 Interstitial pulmonary disease, unspecified: Secondary | ICD-10-CM

## 2014-10-04 DIAGNOSIS — Z9289 Personal history of other medical treatment: Secondary | ICD-10-CM

## 2014-10-04 DIAGNOSIS — Z95810 Presence of automatic (implantable) cardiac defibrillator: Secondary | ICD-10-CM

## 2014-10-04 DIAGNOSIS — I779 Disorder of arteries and arterioles, unspecified: Secondary | ICD-10-CM

## 2014-10-04 DIAGNOSIS — I5022 Chronic systolic (congestive) heart failure: Secondary | ICD-10-CM

## 2014-10-04 DIAGNOSIS — Z87898 Personal history of other specified conditions: Secondary | ICD-10-CM

## 2014-10-04 DIAGNOSIS — I739 Peripheral vascular disease, unspecified: Secondary | ICD-10-CM

## 2014-10-04 MED ORDER — EZETIMIBE 10 MG PO TABS: 10.0000 mg | ORAL_TABLET | Freq: Every day | ORAL | Status: DC

## 2014-10-04 MED ORDER — ALBUTEROL SULFATE HFA 108 (90 BASE) MCG/ACT IN AERS: 2.0000 | INHALATION_SPRAY | Freq: Four times a day (QID) | RESPIRATORY_TRACT | Status: DC | PRN
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
## 2014-10-04 NOTE — Patient Instructions (Addendum)
Your physician recommends that you schedule a follow-up appointment in: 4 months.  Your physician has recommended you make the following change in your medication:  Start zetia 10 mg daily Continue all other medications the same. Your physician recommends that you have a FASTING lipid profile done in 2 months.

## 2014-10-17 ENCOUNTER — Encounter: Payer: Self-pay | Admitting: Vascular Surgery

## 2014-10-17 ENCOUNTER — Telehealth: Payer: Self-pay | Admitting: *Deleted

## 2014-10-17 ENCOUNTER — Ambulatory Visit (INDEPENDENT_AMBULATORY_CARE_PROVIDER_SITE_OTHER): Payer: BLUE CROSS/BLUE SHIELD | Admitting: *Deleted

## 2014-10-17 DIAGNOSIS — I824Y9 Acute embolism and thrombosis of unspecified deep veins of unspecified proximal lower extremity: Secondary | ICD-10-CM

## 2014-10-17 DIAGNOSIS — I824Y1 Acute embolism and thrombosis of unspecified deep veins of right proximal lower extremity: Secondary | ICD-10-CM

## 2014-10-17 LAB — POCT INR: INR: 2.3

## 2014-10-17 MED ORDER — NITROGLYCERIN 0.4 MG SL SUBL: 0.4000 mg | SUBLINGUAL_TABLET | SUBLINGUAL | Status: DC | PRN

## 2014-10-17 NOTE — Telephone Encounter (Signed)
[facility] requesting refill of nitroglycerin. Medication sent to pharmacy.

## 2014-10-18 ENCOUNTER — Encounter: Payer: Self-pay | Admitting: Vascular Surgery

## 2014-10-18 ENCOUNTER — Ambulatory Visit (INDEPENDENT_AMBULATORY_CARE_PROVIDER_SITE_OTHER): Payer: BLUE CROSS/BLUE SHIELD | Admitting: Vascular Surgery

## 2014-10-18 VITALS — BP 127/70 | HR 62 | Ht 69.0 in | Wt 191.0 lb

## 2014-10-18 DIAGNOSIS — Z48812 Encounter for surgical aftercare following surgery on the circulatory system: Secondary | ICD-10-CM

## 2014-10-18 NOTE — Progress Notes (Signed)
Vascular and Vein Specialist of [facility]  Patient name: Adam House MRN: [id number] DOB: 08/02/1951 Sex: male  REASON FOR VISIT: Follow up after right femoral embolectomy and fasciotomy.  HPI: [name] is a 64 y.o. male who presented with an ischemic right lower extremity on 06/24/2014. He underwent a right femoral embolectomy with bovine pericardial patch angioplasty and 4 compartment fasciotomy. I last saw him on 12-15 at which time he had a palpable dorsalis pedis pulse on the right. The lateral fasciotomy site was granulating nicely and measured 15 cm in width by 10 cm in length. He was on Coumadin at that time. He was set up for a 2 month follow up visit.  Since I saw him last he denies any significant problems. He denies claudication, rest pain, or nonhealing ulcers. His fasciotomy site has healed.  He apparently was being considered for lung biopsy but at this point does not wish to proceed with this. He remains on Coumadin.  Past Medical History  Diagnosis Date  . CAD (coronary artery disease)     a. ant MI 1998 with PCI to LAD. b. Ant-lat MI 04/2012 tx with overlapping DES to LAD.  [name] Systolic CHF     NYHA Class II/III  . Ischemic cardiomyopathy     EF 25-30% by echo 04/2012; [place]  . CRI (chronic renal insufficiency)   . Unspecified essential hypertension   . Impotence of organic origin   . Esophageal reflux   . Tobacco use disorder   . ICD (implantable cardiac defibrillator) in place   . Hypercholesteremia   . Heart murmur   . CVA (cerebral infarction) 11/2010  . Gout   . 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 Lung disease, interstitial    Family History  Problem Relation Age of Onset  . Diabetes Mother   . Heart disease Father    SOCIAL HISTORY: History  Substance Use Topics  . Smoking status: Former Smoker -- 0.50 packs/day for 40 years    Types: Cigarettes    Start date: 12/20/1983    Quit date: 02/13/2014  . Smokeless tobacco:  Current User    Types: Snuff  . Alcohol Use: No     Comment: 08/24/2012 "used to drink; not anymore; stopped > 94yrs ago; never had any problems w/it"   Allergies  Allergen Reactions  . Lisinopril Anaphylaxis and Swelling    angioedema  . Naproxen Hives   Current Outpatient Prescriptions  Medication Sig Dispense Refill  . albuterol (PROVENTIL HFA;VENTOLIN HFA) 108 (90 BASE) MCG/ACT inhaler Inhale 2 puffs into the lungs every 6 (six) hours as needed for wheezing or shortness of breath. For shortness of breath 3.7 g 6  . colchicine 0.6 MG tablet Take 1 tablet (0.6 mg total) by mouth daily as needed. For gout 30 tablet 11  . digoxin (LANOXIN) 0.125 MG tablet Take 0.125 mg by mouth daily.    . furosemide (LASIX) 20 MG tablet Take 1 tablet by mouth daily.    . isosorbide dinitrate (DILATRATE-SR) 40 MG CR capsule Take 1 capsule (40 mg total) by mouth 3 (three) times daily. 90 capsule 6  . losartan (COZAAR) 25 MG tablet Take 1 tablet (25 mg total) by mouth daily. 30 tablet 6  . metoprolol succinate (TOPROL XL) 100 MG 24 hr tablet Take 1 tablet (100 mg total) by mouth daily. Take with or immediately following a meal. 30 tablet 0  . nitroGLYCERIN (NITROSTAT) 0.4 MG SL tablet Place 1  tablet (0.4 mg total) under the tongue every 5 (five) minutes as needed for chest pain (up to 3 doses). 25 tablet 3  . pantoprazole (PROTONIX) 40 MG tablet Take 1 tablet (40 mg total) by mouth daily. 90 tablet 3  . potassium chloride SA (KLOR-CON M20) 20 MEQ tablet Take 1 tablet (20 mEq total) by mouth daily. 30 tablet 6  . Red Yeast Rice 600 MG CAPS Take by mouth 2 (two) times daily.    . sotalol (BETAPACE) 80 MG tablet Take 2 tablets (160 mg total) by mouth 2 (two) times daily. 120 tablet 3  . warfarin (COUMADIN) 5 MG tablet Take 1 tablet (5 mg total) by mouth daily. 30 tablet 6   No current facility-administered medications for this visit.   REVIEW OF SYSTEMS: Valu.Nieves ] denotes positive finding; [  ] denotes negative  finding  CARDIOVASCULAR:  [ ]  chest pain   [ ]  chest pressure   [ ]  palpitations   [ ]  orthopnea   [ ]  dyspnea on exertion   [ ]  claudication   [ ]  rest pain   [ ]  DVT   [ ]  phlebitis PULMONARY:   [ ]  productive cough   [ ]  asthma   [ ]  wheezing NEUROLOGIC:   [ ]  weakness  [ ]  paresthesias  [ ]  aphasia  [ ]  amaurosis  [ ]  dizziness HEMATOLOGIC:   [ ]  bleeding problems   [ ]  clotting disorders MUSCULOSKELETAL:  [ ]  joint pain   [ ]  joint swelling [ ]  leg swelling GASTROINTESTINAL: [ ]   blood in stool  [ ]   hematemesis GENITOURINARY:  [ ]   dysuria  [ ]   hematuria PSYCHIATRIC:  [ ]  history of major depression INTEGUMENTARY:  [ ]  rashes  [ ]  ulcers CONSTITUTIONAL:  [ ]  fever   [ ]  chills  PHYSICAL EXAM: Filed Vitals:   10/18/14 0942  BP: 127/70  Pulse: 62  Height: 5\' 9"  (1.753 m)  Weight: 191 lb (86.637 kg)  SpO2: 100%   Body mass index is 28.19 kg/(m^2). GENERAL: The patient is a well-nourished male, in no acute distress. The vital signs are documented above. CARDIOVASCULAR: There is a regular rate and rhythm. I do not detect carotid bruits. He has palpable femoral pulses and palpable dorsalis pedis pulses bilaterally. PULMONARY: There is good air exchange bilaterally without wheezing or rales. ABDOMEN: Soft and non-tender with normal pitched bowel sounds.  MUSCULOSKELETAL: There are no major deformities or cyanosis. NEUROLOGIC: No focal weakness or paresthesias are detected. SKIN: There are no ulcers or rashes noted. His lateral fasciotomy site has completely healed. PSYCHIATRIC: The patient has a normal affect.  MEDICAL ISSUES: The patient is doing well status post right femoral embolectomy and fasciotomy. He has palpable pedal pulses. He is on Coumadin. I will see him back as needed. Fortunately he is not a smoker.   Return if symptoms worsen or fail to improve.  [place] [facility] [place] Beeper: [phone]

## 2014-10-20 ENCOUNTER — Telehealth: Payer: Self-pay | Admitting: *Deleted

## 2014-10-20 ENCOUNTER — Encounter: Payer: Self-pay | Admitting: Internal Medicine

## 2014-10-20 ENCOUNTER — Telehealth: Payer: Self-pay

## 2014-10-20 ENCOUNTER — Ambulatory Visit (INDEPENDENT_AMBULATORY_CARE_PROVIDER_SITE_OTHER): Payer: BLUE CROSS/BLUE SHIELD | Admitting: Internal Medicine

## 2014-10-20 VITALS — BP 139/72 | HR 61 | Ht 69.5 in | Wt 190.8 lb

## 2014-10-20 DIAGNOSIS — I472 Ventricular tachycardia, unspecified: Secondary | ICD-10-CM

## 2014-10-20 DIAGNOSIS — I5022 Chronic systolic (congestive) heart failure: Secondary | ICD-10-CM | POA: Diagnosis not present

## 2014-10-20 LAB — MDC_IDC_ENUM_SESS_TYPE_INCLINIC
HIGH POWER IMPEDANCE MEASURED VALUE: 42 Ohm
Implantable Pulse Generator Serial Number: 147011
Lead Channel Impedance Value: 459 Ohm
Lead Channel Impedance Value: 548 Ohm
Lead Channel Sensing Intrinsic Amplitude: 12.8 mV
Lead Channel Sensing Intrinsic Amplitude: 9.3 mV
Lead Channel Setting Pacing Amplitude: 2 V
Lead Channel Setting Pacing Amplitude: 2.4 V
Lead Channel Setting Pacing Pulse Width: 0.4 ms
Lead Channel Setting Sensing Sensitivity: 0.6 mV
MDC IDC SESS DTM: 20160205050000
MDC IDC SET ZONE DETECTION INTERVAL: 240 ms
MDC IDC SET ZONE DETECTION INTERVAL: 300 ms
Zone Setting Detection Interval: 353 ms

## 2014-10-20 NOTE — Patient Instructions (Signed)
Continue all current medications. Follow up in  3 months - Dr. Rayann Heman

## 2014-10-20 NOTE — Telephone Encounter (Signed)
Pt given 2 boxes of Zetia 10 mg Lot# [id number] Exp. 10/17 and coupon. Per Dr. Bronson Ing

## 2014-10-20 NOTE — Telephone Encounter (Signed)
Pt will pick up Zetia samples in [place] (his home ctr) I mailed him the Patient Assistance form but he said it was" no ones's business how much money he makes and it's personal"  I doubt he will fill form out. I explained that it is to his benefit to complete form and that I can't help him if he is unwilling to provide income so they may determine if he is eligible for assistance

## 2014-10-20 NOTE — Progress Notes (Signed)
Electrophysiology Office Note   Date:  10/20/2014   ID:  Adam House, DOB [birth year], MRN [id number]  PCP:  Rosita Fire, MD  Cardiologist:  Dr Bronson Ing Primary Electrophysiologist: Thompson Grayer, MD    Chief Complaint  Patient presents with  . VT     History of Present Illness: Adam House is a 64 y.o. male who presents today for electrophysiology evaluation.   The patient presents for EP follow-up.  He has had no further VT.  He continues to make slow recovery.  His energy is improving.  His SOB is also better.  He is tolerating coumadin without bleeding.  He recently saw Dr Scot Dock and has been released from his care. The patient sold his antique car recently.  He seems pretty happy with this.   He is much less anxious today. Today, he denies symptoms of palpitations, chest pain,   orthopnea, PND, lower extremity edema, claudication, dizziness, presyncope, syncope, bleeding, or neurologic sequela. The patient is tolerating medications without difficulties and is otherwise without complaint today.    Past Medical History  Diagnosis Date  . CAD (coronary artery disease)     a. ant MI 1998 with PCI to LAD. b. Ant-lat MI 04/2012 tx with overlapping DES to LAD.  [name] Systolic CHF     NYHA Class II/III  . Ischemic cardiomyopathy     EF 25-30% by echo 04/2012; [place]  . CRI (chronic renal insufficiency)   . Unspecified essential hypertension   . Impotence of organic origin   . Esophageal reflux   . Tobacco use disorder   . ICD (implantable cardiac defibrillator) in place   . Hypercholesteremia   . Heart murmur   . CVA (cerebral infarction) 11/2010  . Gout   . 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 Lung disease, interstitial    Past Surgical History  Procedure Laterality Date  . Anterior cervical decomp/discectomy fusion  2007    "put in 4 screws to hold my head on; ruptured C5; job related injury" (08/24/2012)  . Cardiac defibrillator placement  2011   [employer]  he is a MAD[facility] RIT study patient  . Coronary angioplasty with stent placement  05/1997; 04/2012    "1 + 2; total of 3" (08/24/2012)  . Embolectomy Right 06/24/2014    Procedure: RIGHT FEMORAL EMBOLECTOMY,  ;  Surgeon: Angelia Mould, MD;  Location: [facility] OR;  Service: Vascular;  Laterality: Right;  Right Femoral Embolectomy, with bovine patch angioplasty,.  . Fasciotomy Left 06/24/2014    Procedure: FASCIOTOMY;  Surgeon: Angelia Mould, MD;  Location: [place];  Service: Vascular;  Laterality: Left;  four compartment Fasciotomy.  . Left heart catheterization with coronary angiogram N/A 04/22/2012    Procedure: LEFT HEART CATHETERIZATION WITH CORONARY ANGIOGRAM;  Surgeon: Leonie Man, MD;  Location[facility] CATH LAB;  Service: Cardiovascular;  Laterality: N/A;  . Percutaneous coronary stent intervention (pci-s)  04/22/2012    Procedure: PERCUTANEOUS CORONARY STENT INTERVENTION (PCI-S);  Surgeon: Leonie Man, MD;  Location: [facility] CATH LAB;  Service: Cardiovascular;;     Current Outpatient Prescriptions  Medication Sig Dispense Refill  . albuterol (PROVENTIL HFA;VENTOLIN HFA) 108 (90 BASE) MCG/ACT inhaler Inhale 2 puffs into the lungs every 6 (six) hours as needed for wheezing or shortness of breath. For shortness of breath 3.7 g 6  . colchicine 0.6 MG tablet Take 1 tablet (0.6 mg total) by mouth daily as needed. For gout 30 tablet 11  . digoxin (  LANOXIN) 0.125 MG tablet Take 0.125 mg by mouth daily.    . furosemide (LASIX) 20 MG tablet Take 1 tablet by mouth daily.    . isosorbide dinitrate (DILATRATE-SR) 40 MG CR capsule Take 1 capsule (40 mg total) by mouth 3 (three) times daily. 90 capsule 6  . losartan (COZAAR) 25 MG tablet Take 1 tablet (25 mg total) by mouth daily. 30 tablet 6  . metoprolol succinate (TOPROL XL) 100 MG 24 hr tablet Take 1 tablet (100 mg total) by mouth daily. Take with or immediately following a meal. 30 tablet 0  . nitroGLYCERIN (NITROSTAT) 0.4 MG SL  tablet Place 1 tablet (0.4 mg total) under the tongue every 5 (five) minutes as needed for chest pain (up to 3 doses). 25 tablet 3  . pantoprazole (PROTONIX) 40 MG tablet Take 1 tablet (40 mg total) by mouth daily. 90 tablet 3  . potassium chloride SA (KLOR-CON M20) 20 MEQ tablet Take 1 tablet (20 mEq total) by mouth daily. 30 tablet 6  . Red Yeast Rice 600 MG CAPS Take by mouth 2 (two) times daily.    . sotalol (BETAPACE) 80 MG tablet Take 2 tablets (160 mg total) by mouth 2 (two) times daily. 120 tablet 3  . warfarin (COUMADIN) 5 MG tablet Take 1 tablet (5 mg total) by mouth daily. 30 tablet 6   No current facility-administered medications for this visit.    Allergies:   Lisinopril and Naproxen   Social History:  The patient  reports that he quit smoking about 8 months ago. His smoking use included Cigarettes. He started smoking about 30 years ago. He has a 20 pack-year smoking history. His smokeless tobacco use includes Snuff. He reports that he uses illicit drugs (Marijuana). He reports that he does not drink alcohol.   Family History:  The patient's family history includes Diabetes in his mother; Heart disease in his father.    ROS:  Please see the history of present illness.   All other systems are reviewed and negative.    PHYSICAL EXAM: VS:  BP 139/72 mmHg  Pulse 61  Ht 5' 9.5" (1.765 m)  Wt 190 lb 12.8 oz (86.546 kg)  BMI 27.78 kg/m2 , BMI Body mass index is 27.78 kg/(m^2). GEN: Well nourished, well developed, in no acute distress HEENT: normal Neck: no JVD, carotid bruits, or masses Cardiac: RRR; no murmurs, rubs, or gallops,no edema  Respiratory:  clear to auscultation bilaterally, normal work of breathing GI: soft, nontender, nondistended, + BS MS: no deformity or atrophy Skin: warm and dry, device pocket is well healed Neuro:  Strength and sensation are intact Psych: euthymic mood, full affect  EKG:  EKG is ordered today. The ekg ordered today shows sinus rhythm 67  bpm, LAA< inferior infarct, Qtc 412  Device interrogation is reviewed today in detail.  See PaceArt for details.   Recent Labs: 06/29/2014: ALT 15 06/30/2014: TSH 6.650* 07/08/2014: Hemoglobin 11.9*; Platelets 425* 07/10/2014: Magnesium 2.2 07/11/2014: BUN 37*; Creatinine 1.67*; Potassium 4.5; Sodium 132*    Lipid Panel     Component Value Date/Time   CHOL 148 08/25/2012 0204   TRIG 112 08/25/2012 0204   HDL 23* 08/25/2012 0204   CHOLHDL 6.4 08/25/2012 0204   VLDL 22 08/25/2012 0204   LDLCALC 103* 08/25/2012 0204     Wt Readings from Last 3 Encounters:  10/20/14 190 lb 12.8 oz (86.546 kg)  10/18/14 191 lb (86.637 kg)  10/04/14 181 lb (82.101 kg)  Other studies Reviewed: Additional studies/ records that were reviewed today include: Dr Nicole Cella note and Dr Raylene Everts note      ASSESSMENT AND PLAN:  1. VT Stable on sotalol 160mg  BID No changes today Qt is stable Normal ICD function  2. Chronic systolic dysfuntion euvolemic today No changes  3. CRI No changes  4. Afib Maintaining sinus rhythm Continue anticoagulation life long  5. Lung lesion I have encouraged him to follow-up on this  Return in 3 months for further assessment Will monitor remotely with [facility]itude in the interim Follow-up with Dr Bronson Ing as scheduled   Current medicines are reviewed at length with the patient today.   The patient does not have concerns regarding his medicines.  The following changes were made today:  none  Labs/ tests ordered today include:  Orders Placed This Encounter  Procedures  . Implantable device check  . EKG 12-Lead    Signed, Thompson Grayer, MD  10/20/2014 1:41 PM     [address] [place] [address] [phone] (office) [phone] (fax)

## 2014-10-23 ENCOUNTER — Ambulatory Visit: Payer: BC Managed Care – PPO | Admitting: Cardiovascular Disease

## 2014-10-24 ENCOUNTER — Encounter: Payer: Self-pay | Admitting: *Deleted

## 2014-10-24 NOTE — Progress Notes (Signed)
Patient declined to participate in the application for free zetia. Patient informed Edd Fabian that he did not want to pursue getting free medication and he didn't want anyone knowing his personal financial status. MD informed.

## 2014-10-25 ENCOUNTER — Ambulatory Visit: Payer: BLUE CROSS/BLUE SHIELD | Admitting: Cardiovascular Disease

## 2014-11-14 ENCOUNTER — Ambulatory Visit (INDEPENDENT_AMBULATORY_CARE_PROVIDER_SITE_OTHER): Payer: BLUE CROSS/BLUE SHIELD | Admitting: *Deleted

## 2014-11-14 DIAGNOSIS — I824Y1 Acute embolism and thrombosis of unspecified deep veins of right proximal lower extremity: Secondary | ICD-10-CM

## 2014-11-14 LAB — POCT INR: INR: 3.4

## 2014-11-15 ENCOUNTER — Other Ambulatory Visit: Payer: Self-pay | Admitting: Vascular Surgery

## 2014-11-27 ENCOUNTER — Telehealth: Payer: Self-pay | Admitting: *Deleted

## 2014-11-27 NOTE — Telephone Encounter (Signed)
Notes Recorded by Laurine Blazer, LPN on 8/83/2549 at 8:26 PM Left message to return call.

## 2014-11-27 NOTE — Telephone Encounter (Signed)
-----   Message from Merlene Laughter, LPN sent at 1/63/8466  4:35 PM EST -----   ----- Message -----    From: Herminio Commons, MD    Sent: 11/24/2014   2:44 PM      To: Merlene Laughter, LPN  Continue Zetia for now. Repeat lipids in 3 months.

## 2014-11-28 NOTE — Telephone Encounter (Signed)
Patient informed. Patient is currently awaiting to get assistance for zetia and has only take 2 weeks worth. Patient instructed that he would be contacted once we get a response and will need to get these labs after taking zetia for 3 months.

## 2014-11-28 NOTE — Telephone Encounter (Signed)
Patient informed. Patient informed nurse that he no longer sees Dr. Legrand Rams and now sees Dr. Woody Seller.

## 2014-12-05 ENCOUNTER — Ambulatory Visit (INDEPENDENT_AMBULATORY_CARE_PROVIDER_SITE_OTHER): Payer: BLUE CROSS/BLUE SHIELD | Admitting: *Deleted

## 2014-12-05 DIAGNOSIS — I824Y1 Acute embolism and thrombosis of unspecified deep veins of right proximal lower extremity: Secondary | ICD-10-CM | POA: Diagnosis not present

## 2014-12-05 LAB — POCT INR: INR: 2

## 2014-12-26 ENCOUNTER — Ambulatory Visit (INDEPENDENT_AMBULATORY_CARE_PROVIDER_SITE_OTHER): Payer: BLUE CROSS/BLUE SHIELD | Admitting: *Deleted

## 2014-12-26 ENCOUNTER — Other Ambulatory Visit (HOSPITAL_COMMUNITY): Payer: BC Managed Care – PPO

## 2014-12-26 ENCOUNTER — Ambulatory Visit: Payer: Self-pay | Admitting: Vascular Surgery

## 2014-12-26 DIAGNOSIS — I824Y1 Acute embolism and thrombosis of unspecified deep veins of right proximal lower extremity: Secondary | ICD-10-CM

## 2014-12-26 LAB — POCT INR: INR: 1.7

## 2015-01-01 ENCOUNTER — Encounter: Payer: Self-pay | Admitting: Vascular Surgery

## 2015-01-02 ENCOUNTER — Other Ambulatory Visit (HOSPITAL_COMMUNITY): Payer: BC Managed Care – PPO

## 2015-01-02 ENCOUNTER — Ambulatory Visit: Payer: Self-pay | Admitting: Vascular Surgery

## 2015-01-04 ENCOUNTER — Telehealth: Payer: Self-pay | Admitting: *Deleted

## 2015-01-04 NOTE — Telephone Encounter (Signed)
Spoke with [employer] patient assistance representative and was informed that the application was returned on 12/04/14 due to the patient not indication if he was male or male. Application printed off and corrected, then mailed to [employer].

## 2015-01-08 ENCOUNTER — Ambulatory Visit (INDEPENDENT_AMBULATORY_CARE_PROVIDER_SITE_OTHER): Payer: BLUE CROSS/BLUE SHIELD | Admitting: Cardiovascular Disease

## 2015-01-08 ENCOUNTER — Encounter: Payer: Self-pay | Admitting: Cardiovascular Disease

## 2015-01-08 VITALS — BP 123/73 | HR 63 | Ht 69.0 in | Wt 207.0 lb

## 2015-01-08 DIAGNOSIS — I472 Ventricular tachycardia, unspecified: Secondary | ICD-10-CM

## 2015-01-08 DIAGNOSIS — N183 Chronic kidney disease, stage 3 unspecified: Secondary | ICD-10-CM

## 2015-01-08 DIAGNOSIS — I25812 Atherosclerosis of bypass graft of coronary artery of transplanted heart without angina pectoris: Secondary | ICD-10-CM

## 2015-01-08 DIAGNOSIS — I10 Essential (primary) hypertension: Secondary | ICD-10-CM | POA: Diagnosis not present

## 2015-01-08 DIAGNOSIS — I5022 Chronic systolic (congestive) heart failure: Secondary | ICD-10-CM

## 2015-01-08 DIAGNOSIS — Z95810 Presence of automatic (implantable) cardiac defibrillator: Secondary | ICD-10-CM

## 2015-01-08 DIAGNOSIS — J849 Interstitial pulmonary disease, unspecified: Secondary | ICD-10-CM

## 2015-01-08 DIAGNOSIS — I779 Disorder of arteries and arterioles, unspecified: Secondary | ICD-10-CM

## 2015-01-08 DIAGNOSIS — I255 Ischemic cardiomyopathy: Secondary | ICD-10-CM

## 2015-01-08 DIAGNOSIS — I739 Peripheral vascular disease, unspecified: Secondary | ICD-10-CM

## 2015-01-08 DIAGNOSIS — E785 Hyperlipidemia, unspecified: Secondary | ICD-10-CM

## 2015-01-08 MED ORDER — ISOSORBIDE MONONITRATE ER 30 MG PO TB24: 30.0000 mg | ORAL_TABLET | Freq: Every day | ORAL | Status: DC

## 2015-01-08 NOTE — Patient Instructions (Addendum)
   Stop Isosorbide Dinitrate  Decrease Imdur to '30mg'$  daily - new prescription sent to [facility] today. Continue all other medications.   Labs for Lipids after being on Zetia for 3 months.  Your physician wants you to follow up in: 6 months.  You will receive a reminder letter in the mail one-two months in advance.  If you don't receive a letter, please call our office to schedule the follow up appointment

## 2015-01-08 NOTE — Progress Notes (Signed)
Patient ID: Adam House, male   DOB: August 10, 1951, 64 y.o.   MRN: [id number]      SUBJECTIVE: Adam House presents for routine cardiovascular follow up. Although he had only been taking Zetia for 2 weeks, lipids on 11/23/14 demonstrated total cholesterol 183, triglycerides 320, HDL 25, LDL 94. He is still waiting on the Zetia assistance program. He denies chest pain, palpitations, and shortness of breath. His previous PCP had him taking 240 mg of Imdur three times daily. He is now seeing Adam House. He is considering having his anticoagulation monitoring performed by his PCP. He takes Xanax twice daily for anxiety and feels much better.  Device interrogation on 10/20/14 demonstrated normal function with 2 episodes of NSVT with no mode switches.    Soc: Married. Wife works in the [place]. He has been a [employer] fan since the mid-60's.  Review of Systems: As per "subjective", otherwise negative.  Allergies  Allergen Reactions  . Lisinopril Anaphylaxis and Swelling    angioedema  . Naproxen Hives    Current Outpatient Prescriptions  Medication Sig Dispense Refill  . albuterol (PROVENTIL HFA;VENTOLIN HFA) 108 (90 BASE) MCG/ACT inhaler Inhale 2 puffs into the lungs every 6 (six) hours as needed for wheezing or shortness of breath. For shortness of breath 3.7 g 6  . ALPRAZolam (XANAX) 0.5 MG tablet Take 0.5 mg by mouth 2 (two) times daily as needed for anxiety.    . colchicine 0.6 MG tablet Take 1 tablet (0.6 mg total) by mouth daily as needed. For gout (Patient taking differently: Take 0.6 mg by mouth daily. For gout) 30 tablet 11  . digoxin (LANOXIN) 0.125 MG tablet Take 0.125 mg by mouth daily.    . furosemide (LASIX) 20 MG tablet Take 1 tablet by mouth daily.    [name] ipratropium (ATROVENT) 0.02 % nebulizer solution Take 0.5 mg by nebulization every 6 (six) hours as needed for wheezing or shortness of breath.    . isosorbide mononitrate (IMDUR) 120 MG 24 hr tablet Take 240  mg by mouth 3 (three) times daily.    [name] losartan (COZAAR) 25 MG tablet Take 1 tablet (25 mg total) by mouth daily. 30 tablet 6  . metoprolol succinate (TOPROL XL) 100 MG 24 hr tablet Take 1 tablet (100 mg total) by mouth daily. Take with or immediately following a meal. 30 tablet 0  . nitroGLYCERIN (NITROSTAT) 0.4 MG SL tablet Place 1 tablet (0.4 mg total) under the tongue every 5 (five) minutes as needed for chest pain (up to 3 doses). 25 tablet 3  . pantoprazole (PROTONIX) 40 MG tablet Take 1 tablet (40 mg total) by mouth daily. 90 tablet 3  . potassium chloride SA (KLOR-CON M20) 20 MEQ tablet Take 1 tablet (20 mEq total) by mouth daily. 30 tablet 6  . Red Yeast Rice 600 MG CAPS Take by mouth 2 (two) times daily.    . sotalol (BETAPACE) 80 MG tablet Take 2 tablets (160 mg total) by mouth 2 (two) times daily. (Patient taking differently: Take 120 mg by mouth 2 (two) times daily. ) 120 tablet 3  . warfarin (COUMADIN) 5 MG tablet Take 1 tablet (5 mg total) by mouth daily. 30 tablet 6  . isosorbide dinitrate (DILATRATE-SR) 40 MG CR capsule Take 1 capsule (40 mg total) by mouth 3 (three) times daily. (Patient not taking: Reported on 01/08/2015) 90 capsule 6   No current facility-administered medications for this visit.    Past Medical History  Diagnosis Date  .  CAD (coronary artery disease)     a. ant MI 1998 with PCI to LAD. b. Ant-lat MI 04/2012 tx with overlapping DES to LAD.  [name] Systolic CHF     NYHA Class II/III  . Ischemic cardiomyopathy     EF 25-30% by echo 04/2012; [place]  . CRI (chronic renal insufficiency)   . Unspecified essential hypertension   . Impotence of organic origin   . Esophageal reflux   . Tobacco use disorder   . ICD (implantable cardiac defibrillator) in place   . Hypercholesteremia   . Heart murmur   . CVA (cerebral infarction) 11/2010  . Gout   . 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 Lung disease, interstitial     Past Surgical History  Procedure  Laterality Date  . Anterior cervical decomp/discectomy fusion  2007    "put in 4 screws to hold my head on; ruptured C5; job related injury" (08/24/2012)  . Cardiac defibrillator placement  2011    [employer]  he is a MADIT RIT study patient  . Coronary angioplasty with stent placement  05/1997; 04/2012    "1 + 2; total of 3" (08/24/2012)  . Embolectomy Right 06/24/2014    Procedure: RIGHT FEMORAL EMBOLECTOMY,  ;  Surgeon: Angelia Mould, MD;  Location: [facility];  Service: Vascular;  Laterality: Right;  Right Femoral Embolectomy, with bovine patch angioplasty,.  . Fasciotomy Left 06/24/2014    Procedure: FASCIOTOMY;  Surgeon: Angelia Mould, MD;  Location: [place];  Service: Vascular;  Laterality: Left;  four compartment Fasciotomy.  . Left heart catheterization with coronary angiogram N/A 04/22/2012    Procedure: LEFT HEART CATHETERIZATION WITH CORONARY ANGIOGRAM;  Surgeon: Leonie Man, MD;  Location: [facility] CATH LAB;  Service: Cardiovascular;  Laterality: N/A;  . Percutaneous coronary stent intervention (pci-s)  04/22/2012    Procedure: PERCUTANEOUS CORONARY STENT INTERVENTION (PCI-S);  Surgeon: Leonie Man, MD;  Location: [facility] CATH LAB;  Service: Cardiovascular;;    History   Social History  . Marital Status: Married    Spouse Name: N/A  . Number of Children: N/A  . Years of Education: N/A   Occupational History  . RETIRED    Social History Main Topics  . Smoking status: Former Smoker -- 0.50 packs/day for 40 years    Types: Cigarettes    Start date: 12/20/1983    Quit date: 02/13/2014  . Smokeless tobacco: Current User    Types: Snuff  . Alcohol Use: No     Comment: 08/24/2012 "used to drink; not anymore; stopped > 71yr ago; never had any problems w/it"  . Drug Use: Yes    Special: Marijuana     Comment: 08/24/2012 "last drug use >4-5 months ago"  . Sexual Activity: Yes   Other Topics Concern  . Not on file   Social History Narrative   Last updated:  03/15/2010   [place]  Previously worked for [employer]    Tobacco Use - 1PPD x 40+ years,  has quit x 3 days   ETOH- rare   Drugs- occasional marijuana            Filed Vitals:   01/08/15 0849  BP: 123/73  Pulse: 63  Height: '5\' 9"'$  (1.753 m)  Weight: 207 lb (93.895 kg)    PHYSICAL EXAM General: NAD HEENT: Normal. Neck: No JVD, no thyromegaly. Lungs: Clear to auscultation bilaterally with normal respiratory effort. CV: Nondisplaced PMI. Regular  rate and rhythm, normal S1/S2, no S3/S4, no murmur. No pretibial or periankle edema.  Abdomen: Soft, nontender, no distention.  Neurologic: Alert and oriented x 3.  Psych: Normal affect. Skin: Normal. Musculoskeletal: Normal range of motion, no gross deformities. Extremities: No clubbing or cyanosis.    ECG: Most recent ECG reviewed.      ASSESSMENT AND PLAN: 1. CAD: Stable ischemic heart disease with severe LV dysfunction. Anterolateral STEMI in 8/13 with overlapping DES to LAD. No longer on ASA (due to being on warfarin) or Crestor (due to elevated CK). Continue sotalol 160 mg bid and Toprol-XL 100 mg daily as per EP for VT prophylaxis.   2. Chronic systolic CHF with ICD: Appears euvolemic. Not on ACEI due to history of angioedema. Will continue Lasix 20 mg daily and supplemental KCl. Will hold off on spironolactone due to previously low normal BP and CKD. Will continue metoprolol, losartan, digoxin, and Imdur (reduce to 30 mg daily).   3. Hyperlipidemia:  Lipids after Zetia 10 mg noted above. Once he has received patient assistance and has been on Zetia for three months, will recheck lipids. If not sufficiently reduced, would consider Repatha.  4. Interstitial lung disease with history of tobacco abuse: He has quit smoking. Followed by pulmonary. Has not undergone VATS yet.   5. Carotid stenosis: Chronic RICA occlusion with minimal LICA disease (3-41% in 09/2013). No longer on ASA or statin. On  warfarin and soon to be on Zetia. He is followed by vascular.  6. CKD stage 3: Stable. No changes to therapy.  7. PVD: Followed by vascular. On warfarin.  8. Ventricular tachycardia: Continue sotalol 160 mg bid and Toprol-XL 100 mg daily as per EP for VT prophylaxis. F/u with Dr. Rayann Heman.  Dispo: f/u 6 months.   Kate Sable, M.D., F.A.C.C.

## 2015-01-15 ENCOUNTER — Telehealth: Payer: Self-pay | Admitting: *Deleted

## 2015-01-15 NOTE — Telephone Encounter (Signed)
[name] states that Dr. Woody Seller checked his coumdin last week in his office. He will request that a copy be faxed to our office.

## 2015-01-26 ENCOUNTER — Encounter: Payer: Self-pay | Admitting: Internal Medicine

## 2015-01-26 ENCOUNTER — Ambulatory Visit (INDEPENDENT_AMBULATORY_CARE_PROVIDER_SITE_OTHER): Payer: BLUE CROSS/BLUE SHIELD | Admitting: Internal Medicine

## 2015-01-26 VITALS — BP 110/66 | HR 59 | Ht 69.0 in | Wt 208.0 lb

## 2015-01-26 DIAGNOSIS — I472 Ventricular tachycardia, unspecified: Secondary | ICD-10-CM

## 2015-01-26 DIAGNOSIS — I5022 Chronic systolic (congestive) heart failure: Secondary | ICD-10-CM

## 2015-01-26 DIAGNOSIS — I5023 Acute on chronic systolic (congestive) heart failure: Secondary | ICD-10-CM | POA: Diagnosis not present

## 2015-01-26 DIAGNOSIS — N182 Chronic kidney disease, stage 2 (mild): Secondary | ICD-10-CM | POA: Diagnosis not present

## 2015-01-26 DIAGNOSIS — I10 Essential (primary) hypertension: Secondary | ICD-10-CM

## 2015-01-26 LAB — CUP PACEART INCLINIC DEVICE CHECK
Brady Statistic RA Percent Paced: 1 %
Brady Statistic RV Percent Paced: 1 %
Date Time Interrogation Session: 20160513040000
HighPow Impedance: 42 Ohm
HighPow Impedance: 61 Ohm
Lead Channel Impedance Value: 479 Ohm
Lead Channel Pacing Threshold Amplitude: 1 V
Lead Channel Sensing Intrinsic Amplitude: 10.8 mV
Lead Channel Sensing Intrinsic Amplitude: 18.2 mV
Lead Channel Setting Pacing Pulse Width: 0.4 ms
Lead Channel Setting Sensing Sensitivity: 0.6 mV
MDC IDC MSMT LEADCHNL RA IMPEDANCE VALUE: 567 Ohm
MDC IDC MSMT LEADCHNL RA PACING THRESHOLD PULSEWIDTH: 0.4 ms
MDC IDC MSMT LEADCHNL RV PACING THRESHOLD AMPLITUDE: 0.9 V
MDC IDC MSMT LEADCHNL RV PACING THRESHOLD PULSEWIDTH: 0.4 ms
MDC IDC SET LEADCHNL RA PACING AMPLITUDE: 2 V
MDC IDC SET LEADCHNL RV PACING AMPLITUDE: 2.4 V
Pulse Gen Serial Number: 147011
Zone Setting Detection Interval: 240 ms
Zone Setting Detection Interval: 300 ms
Zone Setting Detection Interval: 353 ms

## 2015-01-26 NOTE — Patient Instructions (Addendum)
Your physician recommends that you continue on your current medications as directed. Please refer to the Current Medication list given to you today. Your physician recommends that you have lab work today to check your Digoxin level, BMET, CBC and Magnesium level. Your physician recommends that you schedule a follow-up appointment in: 6 months with Dr. Rayann Heman. You will receive a reminder letter in the mail in about 4 months reminding you to call and schedule your appointment. If you don't receive this letter, please contact our office. [facility] Device check on 04/30/15.

## 2015-01-26 NOTE — Progress Notes (Signed)
Electrophysiology Office Note   Date:  01/26/2015   ID:  Adam House, DOB 06-03-1951, MRN [id number]  PCP:  Glenda Chroman., MD  Cardiologist:  Dr Bronson Ing Primary Electrophysiologist: Thompson Grayer, MD    Chief Complaint  Patient presents with  . VT     History of Present Illness: Adam House is a 64 y.o. male who presents today for electrophysiology evaluation.   The patient presents for EP follow-up.  He has had no further VT.  He continues to make slow recovery.  His energy is improving.   Today, he denies symptoms of palpitations, chest pain,   orthopnea, PND, lower extremity edema, claudication, dizziness, presyncope, syncope, bleeding, or neurologic sequela. The patient is tolerating medications without difficulties and is otherwise without complaint today.    Past Medical History  Diagnosis Date  . CAD (coronary artery disease)     a. ant MI 1998 with PCI to LAD. b. Ant-lat MI 04/2012 tx with overlapping DES to LAD.  [name] Systolic CHF     NYHA Class II/III  . Ischemic cardiomyopathy     EF 25-30% by echo 04/2012; [place]  . CRI (chronic renal insufficiency)   . Unspecified essential hypertension   . Impotence of organic origin   . Esophageal reflux   . Tobacco use disorder   . ICD (implantable cardiac defibrillator) in place   . Hypercholesteremia   . Heart murmur   . CVA (cerebral infarction) 11/2010  . Gout   . 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 Lung disease, interstitial    Past Surgical History  Procedure Laterality Date  . Anterior cervical decomp/discectomy fusion  2007    "put in 4 screws to hold my head on; ruptured C5; job related injury" (08/24/2012)  . Cardiac defibrillator placement  2011    [employer]  he is a MADIT RIT study patient  . Coronary angioplasty with stent placement  05/1997; 04/2012    "1 + 2; total of 3" (08/24/2012)  . Embolectomy Right 06/24/2014    Procedure: RIGHT FEMORAL EMBOLECTOMY,  ;  Surgeon:  Angelia Mould, MD;  Location: [facility] OR;  Service: Vascular;  Laterality: Right;  Right Femoral Embolectomy, with bovine patch angioplasty,.  . Fasciotomy Left 06/24/2014    Procedure: FASCIOTOMY;  Surgeon: Angelia Mould, MD;  Location: [place];  Service: Vascular;  Laterality: Left;  four compartment Fasciotomy.  . Left heart catheterization with coronary angiogram N/A 04/22/2012    Procedure: LEFT HEART CATHETERIZATION WITH CORONARY ANGIOGRAM;  Surgeon: Leonie Man, MD;  Location: [facility] CATH LAB;  Service: Cardiovascular;  Laterality: N/A;  . Percutaneous coronary stent intervention (pci-s)  04/22/2012    Procedure: PERCUTANEOUS CORONARY STENT INTERVENTION (PCI-S);  Surgeon: Leonie Man, MD;  Location: [facility] CATH LAB;  Service: Cardiovascular;;     Current Outpatient Prescriptions  Medication Sig Dispense Refill  . albuterol (PROVENTIL HFA;VENTOLIN HFA) 108 (90 BASE) MCG/ACT inhaler Inhale 2 puffs into the lungs every 6 (six) hours as needed for wheezing or shortness of breath. For shortness of breath 3.7 g 6  . ALPRAZolam (XANAX) 0.5 MG tablet Take 0.5 mg by mouth 2 (two) times daily as needed for anxiety.    . colchicine 0.6 MG tablet Take 1 tablet (0.6 mg total) by mouth daily as needed. For gout (Patient taking differently: Take 0.6 mg by mouth daily. For gout) 30 tablet 11  . digoxin (LANOXIN) 0.125 MG tablet Take 0.125 mg by mouth daily.    [name]  furosemide (LASIX) 20 MG tablet Take 1 tablet by mouth daily.    [name] ipratropium (ATROVENT) 0.02 % nebulizer solution Take 0.5 mg by nebulization every 6 (six) hours as needed for wheezing or shortness of breath.    . isosorbide mononitrate (IMDUR) 30 MG 24 hr tablet Take 1 tablet (30 mg total) by mouth daily. 30 tablet 6  . losartan (COZAAR) 25 MG tablet Take 1 tablet (25 mg total) by mouth daily. 30 tablet 6  . metoprolol succinate (TOPROL XL) 100 MG 24 hr tablet Take 1 tablet (100 mg total) by mouth daily. Take with or immediately following  a meal. 30 tablet 0  . nitroGLYCERIN (NITROSTAT) 0.4 MG SL tablet Place 1 tablet (0.4 mg total) under the tongue every 5 (five) minutes as needed for chest pain (up to 3 doses). 25 tablet 3  . pantoprazole (PROTONIX) 40 MG tablet Take 1 tablet (40 mg total) by mouth daily. 90 tablet 3  . potassium chloride SA (KLOR-CON M20) 20 MEQ tablet Take 1 tablet (20 mEq total) by mouth daily. 30 tablet 6  . Red Yeast Rice 600 MG CAPS Take by mouth 2 (two) times daily.    . sotalol (BETAPACE) 80 MG tablet Take 2 tablets (160 mg total) by mouth 2 (two) times daily. (Patient taking differently: Take 120 mg by mouth 2 (two) times daily. ) 120 tablet 3  . warfarin (COUMADIN) 5 MG tablet Take 1 tablet (5 mg total) by mouth daily. 30 tablet 6   No current facility-administered medications for this visit.    Allergies:   Lisinopril and Naproxen   Social History:  The patient  reports that he quit smoking about a year ago. His smoking use included Cigarettes. He started smoking about 31 years ago. He has a 20 pack-year smoking history. His smokeless tobacco use includes Snuff. He reports that he uses illicit drugs (Marijuana). He reports that he does not drink alcohol.   Family History:  The patient's family history includes Diabetes in his mother; Heart disease in his father.    ROS:  Please see the history of present illness.   All other systems are reviewed and negative.    PHYSICAL EXAM: VS:  BP 110/66 mmHg  Pulse 59  Ht '5\' 9"'$  (1.753 m)  Wt 208 lb (94.348 kg)  BMI 30.70 kg/m2  SpO2 98% , BMI Body mass index is 30.7 kg/(m^2). GEN: Well nourished, well developed, in no acute distress HEENT: normal Neck: no JVD, carotid bruits, or masses Cardiac: RRR; no murmurs, rubs, or gallops,no edema  Respiratory:  clear to auscultation bilaterally, normal work of breathing GI: soft, nontender, nondistended, + BS MS: no deformity or atrophy Skin: warm and dry, device pocket is well healed Neuro:  Strength and  sensation are intact Psych: euthymic mood, full affect  EKG:  EKG is ordered today. The ekg ordered today shows sinus rhythm, stable QTc  Device interrogation is reviewed today in detail.  See PaceArt for details.   Recent Labs: 06/29/2014: ALT 15 06/30/2014: TSH 6.650* 07/08/2014: Hemoglobin 11.9*; Platelets 425* 07/10/2014: Magnesium 2.2 07/11/2014: BUN 37*; Creatinine 1.67*; Potassium 4.5; Sodium 132*    Lipid Panel     Component Value Date/Time   CHOL 148 08/25/2012 0204   TRIG 112 08/25/2012 0204   HDL 23* 08/25/2012 0204   CHOLHDL 6.4 08/25/2012 0204   VLDL 22 08/25/2012 0204   LDLCALC 103* 08/25/2012 0204     Wt Readings from Last 3 Encounters:  01/26/15 208  lb (94.348 kg)  01/08/15 207 lb (93.895 kg)  10/20/14 190 lb 12.8 oz (86.546 kg)      ASSESSMENT AND PLAN:  1. VT Stable on sotalol '160mg'$  BID No changes today Qt is stable Normal ICD function Labs ordered  2. Chronic systolic dysfuntion euvolemic today No changes Labs ordered  3. CRI No changes  4. Afib Maintaining sinus rhythm Continue anticoagulation life long  5. Lung lesion I have encouraged him to follow-up on this  6. CRI bmet today  Return in 6 months for further assessment Will monitor remotely with [facility] in the interim Follow-up with Dr Bronson Ing as scheduled   Current medicines are reviewed at length with the patient today.   The patient does not have concerns regarding his medicines.  The following changes were made today:  none  Labs/ tests ordered today include:  Orders Placed This Encounter  Procedures  . Basic metabolic panel  . Magnesium  . Digoxin level  . CBC  . Implantable device check  . EKG 12-Lead    Signed, Thompson Grayer, MD  01/26/2015 11:22 PM     [place] [address] [phone] (office) [phone] (fax)

## 2015-02-06 ENCOUNTER — Encounter: Payer: Self-pay | Admitting: Internal Medicine

## 2015-02-06 ENCOUNTER — Telehealth: Payer: Self-pay | Admitting: *Deleted

## 2015-02-06 NOTE — Telephone Encounter (Signed)
-----   Message from Thompson Grayer, MD sent at 02/04/2015  4:42 PM EDT ----- Results reviewed.  Please inform pt of result.

## 2015-02-07 NOTE — Telephone Encounter (Signed)
Patient informed. 

## 2015-02-07 NOTE — Telephone Encounter (Signed)
Returned your call.

## 2015-02-09 ENCOUNTER — Encounter: Payer: Self-pay | Admitting: Vascular Surgery

## 2015-02-13 ENCOUNTER — Ambulatory Visit: Payer: BLUE CROSS/BLUE SHIELD | Admitting: Vascular Surgery

## 2015-02-13 ENCOUNTER — Encounter (HOSPITAL_COMMUNITY): Payer: BLUE CROSS/BLUE SHIELD

## 2015-03-09 ENCOUNTER — Other Ambulatory Visit: Payer: Self-pay | Admitting: *Deleted

## 2015-03-09 DIAGNOSIS — I6523 Occlusion and stenosis of bilateral carotid arteries: Secondary | ICD-10-CM

## 2015-03-14 ENCOUNTER — Encounter: Payer: Self-pay | Admitting: Vascular Surgery

## 2015-03-20 ENCOUNTER — Encounter: Payer: Self-pay | Admitting: Vascular Surgery

## 2015-03-20 ENCOUNTER — Ambulatory Visit (INDEPENDENT_AMBULATORY_CARE_PROVIDER_SITE_OTHER): Payer: BLUE CROSS/BLUE SHIELD | Admitting: Vascular Surgery

## 2015-03-20 ENCOUNTER — Ambulatory Visit (HOSPITAL_COMMUNITY)
Admission: RE | Admit: 2015-03-20 | Discharge: 2015-03-20 | Disposition: A | Discharge status: Home / Self Care | Payer: BLUE CROSS/BLUE SHIELD | Source: Ambulatory Visit | Attending: Vascular Surgery | Admitting: Vascular Surgery

## 2015-03-20 VITALS — BP 121/66 | HR 72 | Resp 16 | Ht 69.5 in | Wt 206.0 lb

## 2015-03-20 DIAGNOSIS — I6522 Occlusion and stenosis of left carotid artery: Secondary | ICD-10-CM | POA: Diagnosis not present

## 2015-03-20 DIAGNOSIS — I6523 Occlusion and stenosis of bilateral carotid arteries: Secondary | ICD-10-CM | POA: Diagnosis not present

## 2015-03-20 NOTE — Progress Notes (Signed)
Subjective:     Patient ID: Adam House, male   DOB: 1950/12/30, 64 y.o.   MRN: [id number]  HPI this 64 year old male returns for follow-up regarding his carotid occlusive disease. He has a known right ICA occlusion. He has mild to moderate left ICA stenosis which we are following. He denies any lateralizing weakness, aphasia, amaurosis fugax, diplopia, blurred vision, or syncope. He had an episode of ischemia of the right lower extremity requiring embolectomy and 4 compartment fasciotomy by Dr. Doren Custard in October 2015 and he has done well from that standpoint. He has no implantable defibrillator in place. He is on chronic Coumadin.  Past Medical History  Diagnosis Date  . CAD (coronary artery disease)     a. ant MI 1998 with PCI to LAD. b. Ant-lat MI 04/2012 tx with overlapping DES to LAD.  [name] Systolic CHF     NYHA Class II/III  . Ischemic cardiomyopathy     EF 25-30% by echo 04/2012; [place]  . CRI (chronic renal insufficiency)   . Unspecified essential hypertension   . Impotence of organic origin   . Esophageal reflux   . Tobacco use disorder   . ICD (implantable cardiac defibrillator) in place   . Hypercholesteremia   . Heart murmur   . CVA (cerebral infarction) 11/2010  . Gout   . 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 Lung disease, interstitial   . Atrial fibrillation   . CHF (congestive heart failure)   . Stroke ? 2006  . DVT (deep venous thrombosis)   . Myocardial infarction 1998 and 2005  . Peripheral arterial disease     History  Substance Use Topics  . Smoking status: Former Smoker -- 0.50 packs/day for 40 years    Types: Cigarettes    Start date: 12/20/1983    Quit date: 02/13/2014  . Smokeless tobacco: Current User    Types: Snuff  . Alcohol Use: No     Comment: 08/24/2012 "used to drink; not anymore; stopped > 64yr ago; never had any problems w/it"    Family History  Problem Relation Age of Onset  . Diabetes Mother   . Cancer Mother     Ovarian   . Heart disease Mother   . Hyperlipidemia Mother   . Hypertension Mother   . Heart attack Mother   . Heart disease Father   . Hyperlipidemia Father   . Hypertension Father   . Heart attack Father   . Heart disease Brother   . Heart attack Brother   . Cancer Sister     Ovarian-Liver-Lung-Brain  . Heart disease Sister   . Heart attack Daughter   . Heart disease Daughter     Before age 64 . Cancer Sister     Lung  . Heart disease Sister     Allergies  Allergen Reactions  . Lisinopril Anaphylaxis and Swelling    angioedema  . Naproxen Hives     Current outpatient prescriptions:  .  albuterol (PROVENTIL HFA;VENTOLIN HFA) 108 (90 BASE) MCG/ACT inhaler, Inhale 2 puffs into the lungs every 6 (six) hours as needed for wheezing or shortness of breath. For shortness of breath, Disp: 3.7 g, Rfl: 6 .  ALPRAZolam (XANAX) 0.5 MG tablet, Take 0.5 mg by mouth 2 (two) times daily as needed for anxiety., Disp: , Rfl:  .  colchicine 0.6 MG tablet, Take 1 tablet (0.6 mg total) by mouth daily as needed. For gout (Patient taking differently: Take 0.6 mg  by mouth daily. For gout), Disp: 30 tablet, Rfl: 11 .  digoxin (LANOXIN) 0.125 MG tablet, Take 0.125 mg by mouth daily., Disp: , Rfl:  .  furosemide (LASIX) 20 MG tablet, Take 1 tablet by mouth daily., Disp: , Rfl:  .  ipratropium (ATROVENT) 0.02 % nebulizer solution, Take 0.5 mg by nebulization every 6 (six) hours as needed for wheezing or shortness of breath., Disp: , Rfl:  .  isosorbide mononitrate (IMDUR) 30 MG 24 hr tablet, Take 1 tablet (30 mg total) by mouth daily., Disp: 30 tablet, Rfl: 6 .  losartan (COZAAR) 25 MG tablet, Take 1 tablet (25 mg total) by mouth daily., Disp: 30 tablet, Rfl: 6 .  metoprolol succinate (TOPROL XL) 100 MG 24 hr tablet, Take 1 tablet (100 mg total) by mouth daily. Take with or immediately following a meal., Disp: 30 tablet, Rfl: 0 .  nitroGLYCERIN (NITROSTAT) 0.4 MG SL tablet, Place 1 tablet (0.4 mg total) under  the tongue every 5 (five) minutes as needed for chest pain (up to 3 doses)., Disp: 25 tablet, Rfl: 3 .  pantoprazole (PROTONIX) 40 MG tablet, Take 1 tablet (40 mg total) by mouth daily., Disp: 90 tablet, Rfl: 3 .  potassium chloride SA (KLOR-CON M20) 20 MEQ tablet, Take 1 tablet (20 mEq total) by mouth daily., Disp: 30 tablet, Rfl: 6 .  Red Yeast Rice 600 MG CAPS, Take by mouth 2 (two) times daily., Disp: , Rfl:  .  sotalol (BETAPACE) 80 MG tablet, Take 2 tablets (160 mg total) by mouth 2 (two) times daily. (Patient taking differently: Take 120 mg by mouth 2 (two) times daily. ), Disp: 120 tablet, Rfl: 3 .  warfarin (COUMADIN) 5 MG tablet, Take 1 tablet (5 mg total) by mouth daily., Disp: 30 tablet, Rfl: 6  Filed Vitals:   03/20/15 1123 03/20/15 1127  BP: 118/76 121/66  Pulse: 77 72  Resp:  16  Height:  5' 9.5" (1.765 m)  Weight:  206 lb (93.441 kg)  SpO2:  100%    Body mass index is 29.99 kg/(m^2).           Review of Systems denies chest pain does have orthopnea and dyspnea on exertion. Has made progress with smoking. Continues to refrain from smoking and is using dip twice daily. Complains of some wheezing on occasion and tingling in the right lower extremity near the incision. Other systems negative and complete review of systems      Objective:   Physical Exam BP 121/66 mmHg  Pulse 72  Resp 16  Ht 5' 9.5" (1.765 m)  Wt 206 lb (93.441 kg)  BMI 29.99 kg/m2  SpO2 100%  Gen.-alert and oriented x3 in no apparent distress HEENT normal for age Lungs no rhonchi or wheezing Cardiovascular regular rhythm no murmurs carotid pulses 3+ palpable no bruits audible Abdomen soft nontender no palpable masses Musculoskeletal free of  major deformities Skin clear -no rashes Neurologic normal Lower extremities 3+ femoral and dorsalis pedis pulses palpable bilaterally with no edema-well-healed incisions right lower extremity medially and laterally at fasciotomy sites. Both feet well  perfused.  Today I ordered a carotid duplex exam which I reviewed and interpreted. Right ICA is totally occluded chronically. Left common carotid has approximately 50-60% stenosis which is stable.       Assessment:     Stable left common carotid stenosis with right ICA occlusion-asymptomatic Successful embolectomy with 4 compartment fasciotomy performed in October 2015 with good circulation restored Making good progress in  discontinuing tobacco use-has stopped smoking and is now using dip twice daily    Plan:     Return in 1 year for carotid duplex exam and see nurse practitioner

## 2015-03-21 NOTE — Addendum Note (Signed)
Addended by: Dorthula Rue L on: 03/21/2015 04:33 PM   Modules accepted: Orders

## 2015-04-05 ENCOUNTER — Ambulatory Visit: Payer: Self-pay | Admitting: *Deleted

## 2015-04-05 DIAGNOSIS — I824Y9 Acute embolism and thrombosis of unspecified deep veins of unspecified proximal lower extremity: Secondary | ICD-10-CM

## 2015-04-30 ENCOUNTER — Encounter: Payer: Self-pay | Admitting: Internal Medicine

## 2015-04-30 ENCOUNTER — Ambulatory Visit (INDEPENDENT_AMBULATORY_CARE_PROVIDER_SITE_OTHER): Payer: BLUE CROSS/BLUE SHIELD | Admitting: *Deleted

## 2015-04-30 DIAGNOSIS — I255 Ischemic cardiomyopathy: Secondary | ICD-10-CM | POA: Diagnosis not present

## 2015-04-30 NOTE — Progress Notes (Signed)
Remote ICD transmission.   

## 2015-05-07 LAB — CUP PACEART REMOTE DEVICE CHECK
Battery Remaining Longevity: 78 mo
Brady Statistic RV Percent Paced: 0 %
Date Time Interrogation Session: 20160815065100
HIGH POWER IMPEDANCE MEASURED VALUE: 65 Ohm
Lead Channel Impedance Value: 475 Ohm
Lead Channel Impedance Value: 566 Ohm
Lead Channel Sensing Intrinsic Amplitude: 13.1 mV
Lead Channel Sensing Intrinsic Amplitude: 8 mV
Lead Channel Setting Pacing Amplitude: 2 V
Lead Channel Setting Pacing Amplitude: 2.4 V
Lead Channel Setting Pacing Pulse Width: 0.4 ms
MDC IDC MSMT BATTERY REMAINING PERCENTAGE: 88 %
MDC IDC PG SERIAL: 147011
MDC IDC SET LEADCHNL RV SENSING SENSITIVITY: 0.6 mV
MDC IDC SET ZONE DETECTION INTERVAL: 300 ms
MDC IDC STAT BRADY RA PERCENT PACED: 0 %
Zone Setting Detection Interval: 240 ms
Zone Setting Detection Interval: 353 ms

## 2015-05-18 ENCOUNTER — Encounter: Payer: Self-pay | Admitting: Cardiology

## 2015-06-01 ENCOUNTER — Encounter: Payer: Self-pay | Admitting: *Deleted

## 2015-06-01 ENCOUNTER — Telehealth: Payer: Self-pay | Admitting: Cardiovascular Disease

## 2015-06-01 NOTE — Telephone Encounter (Signed)
Patient said Dr. Woody Seller ordered labs and the results show the patient's cholesterol and glucose were elevated. Patient also said he is retaining fluid.  The patient wants to know if Dr. Bronson Ing wants to change his medications based on his lab results and fluid retention.

## 2015-06-01 NOTE — Telephone Encounter (Signed)
Spoke with patient. Will have Dr. Woody Seller office fax lab results for review by MD.

## 2015-06-25 ENCOUNTER — Encounter: Payer: Self-pay | Admitting: Cardiovascular Disease

## 2015-06-25 ENCOUNTER — Encounter: Payer: Self-pay | Admitting: *Deleted

## 2015-06-25 ENCOUNTER — Ambulatory Visit (INDEPENDENT_AMBULATORY_CARE_PROVIDER_SITE_OTHER): Payer: BLUE CROSS/BLUE SHIELD | Admitting: Cardiovascular Disease

## 2015-06-25 VITALS — BP 110/60 | HR 58 | Ht 69.0 in | Wt 206.0 lb

## 2015-06-25 DIAGNOSIS — I5022 Chronic systolic (congestive) heart failure: Secondary | ICD-10-CM

## 2015-06-25 DIAGNOSIS — I519 Heart disease, unspecified: Secondary | ICD-10-CM | POA: Diagnosis not present

## 2015-06-25 DIAGNOSIS — N182 Chronic kidney disease, stage 2 (mild): Secondary | ICD-10-CM

## 2015-06-25 DIAGNOSIS — I472 Ventricular tachycardia, unspecified: Secondary | ICD-10-CM

## 2015-06-25 DIAGNOSIS — I6523 Occlusion and stenosis of bilateral carotid arteries: Secondary | ICD-10-CM

## 2015-06-25 DIAGNOSIS — I10 Essential (primary) hypertension: Secondary | ICD-10-CM

## 2015-06-25 DIAGNOSIS — J849 Interstitial pulmonary disease, unspecified: Secondary | ICD-10-CM

## 2015-06-25 DIAGNOSIS — I25812 Atherosclerosis of bypass graft of coronary artery of transplanted heart without angina pectoris: Secondary | ICD-10-CM | POA: Diagnosis not present

## 2015-06-25 DIAGNOSIS — I739 Peripheral vascular disease, unspecified: Secondary | ICD-10-CM

## 2015-06-25 DIAGNOSIS — Z95810 Presence of automatic (implantable) cardiac defibrillator: Secondary | ICD-10-CM

## 2015-06-25 DIAGNOSIS — E785 Hyperlipidemia, unspecified: Secondary | ICD-10-CM

## 2015-06-25 NOTE — Patient Instructions (Signed)
Continue all current medications. Your physician wants you to follow up in: 6 months.  You will receive a reminder letter in the mail one-two months in advance.  If you don't receive a letter, please call our office to schedule the follow up appointment   

## 2015-06-25 NOTE — Progress Notes (Signed)
Patient ID: Adam House, male   DOB: 10/25/1950, 64 y.o.   MRN: [id number]      SUBJECTIVE: The patient presents for routine cardiovascular follow-up. He denies chest pain, palpitations, and leg swelling.  He did not qualify for the [employer] assistance program. He reportedly had his lipids checked relatively recently by his PCP but these results are currently unavailable. He is looking forward to the upcoming [employer] basketball season.   Review of Systems: As per "subjective", otherwise negative.  Allergies  Allergen Reactions  . Lisinopril Anaphylaxis and Swelling    angioedema  . Naproxen Hives    Current Outpatient Prescriptions  Medication Sig Dispense Refill  . albuterol (PROVENTIL HFA;VENTOLIN HFA) 108 (90 BASE) MCG/ACT inhaler Inhale 2 puffs into the lungs every 6 (six) hours as needed for wheezing or shortness of breath. For shortness of breath 3.7 g 6  . ALPRAZolam (XANAX) 0.5 MG tablet Take 0.5 mg by mouth 2 (two) times daily as needed for anxiety.    . colchicine 0.6 MG tablet Take 1 tablet (0.6 mg total) by mouth daily as needed. For gout (Patient taking differently: Take 0.6 mg by mouth daily. For gout) 30 tablet 11  . digoxin (LANOXIN) 0.125 MG tablet Take 0.125 mg by mouth daily.    . furosemide (LASIX) 20 MG tablet Take 1 tablet by mouth daily.    [name] ipratropium (ATROVENT) 0.02 % nebulizer solution Take 0.5 mg by nebulization every 6 (six) hours as needed for wheezing or shortness of breath.    . isosorbide mononitrate (IMDUR) 30 MG 24 hr tablet Take 1 tablet (30 mg total) by mouth daily. 30 tablet 6  . losartan (COZAAR) 25 MG tablet Take 1 tablet (25 mg total) by mouth daily. 30 tablet 6  . metoprolol succinate (TOPROL XL) 100 MG 24 hr tablet Take 1 tablet (100 mg total) by mouth daily. Take with or immediately following a meal. 30 tablet 0  . nitroGLYCERIN (NITROSTAT) 0.4 MG SL tablet Place 1 tablet (0.4 mg total) under the tongue every 5 (five) minutes as needed for  chest pain (up to 3 doses). 25 tablet 3  . pantoprazole (PROTONIX) 40 MG tablet Take 1 tablet (40 mg total) by mouth daily. 90 tablet 3  . potassium chloride SA (KLOR-CON M20) 20 MEQ tablet Take 1 tablet (20 mEq total) by mouth daily. 30 tablet 6  . Red Yeast Rice 600 MG CAPS Take by mouth 2 (two) times daily.    [name] warfarin (COUMADIN) 5 MG tablet Take 1 tablet (5 mg total) by mouth daily. 30 tablet 6   No current facility-administered medications for this visit.    Past Medical History  Diagnosis Date  . CAD (coronary artery disease)     a. ant MI 1998 with PCI to LAD. b. Ant-lat MI 04/2012 tx with overlapping DES to LAD.  [name] Systolic CHF (HCC)     NYHA Class II/III  . Ischemic cardiomyopathy     EF 25-30% by echo 04/2012; [place]  . CRI (chronic renal insufficiency)   . Unspecified essential hypertension   . Impotence of organic origin   . Esophageal reflux   . Tobacco use disorder   . ICD (implantable cardiac defibrillator) in place   . Hypercholesteremia   . Heart murmur   . CVA (cerebral infarction) 11/2010  . Gout   . Post-MI pericarditis ([place])     04/2012  . Lung disease, interstitial ([place])   . Atrial fibrillation ([place])   .  CHF (congestive heart failure) ([place])   . Stroke [facility]) ? 2006  . DVT (deep venous thrombosis) ([place])   . Myocardial infarction ([place]) 1998 and 2005  . Peripheral arterial disease [facility])     Past Surgical History  Procedure Laterality Date  . Anterior cervical decomp/discectomy fusion  2007    "put in 4 screws to hold my head on; ruptured C5; job related injury" (08/24/2012)  . Cardiac defibrillator placement  2011    [employer]  he is a MADIT RIT study patient  . Coronary angioplasty with stent placement  05/1997; 04/2012    "1 + 2; total of 3" (08/24/2012)  . Embolectomy Right 06/24/2014    Procedure: RIGHT FEMORAL EMBOLECTOMY,  ;  Surgeon: Angelia Mould, MD;  Location: [facility] OR;  Service: Vascular;  Laterality: Right;  Right  Femoral Embolectomy, with bovine patch angioplasty,.  . Fasciotomy Left 06/24/2014    Procedure: FASCIOTOMY;  Surgeon: Angelia Mould, MD;  Location: [place];  Service: Vascular;  Laterality: Left;  four compartment Fasciotomy.  . Left heart catheterization with coronary angiogram N/A 04/22/2012    Procedure: LEFT HEART CATHETERIZATION WITH CORONARY ANGIOGRAM;  Surgeon: Leonie Man, MD;  Location: [facility] CATH LAB;  Service: Cardiovascular;  Laterality: N/A;  . Percutaneous coronary stent intervention (pci-s)  04/22/2012    Procedure: PERCUTANEOUS CORONARY STENT INTERVENTION (PCI-S);  Surgeon: Leonie Man, MD;  Location: [facility] CATH LAB;  Service: Cardiovascular;;    Social History   Social History  . Marital Status: Married    Spouse Name: N/A  . Number of Children: N/A  . Years of Education: N/A   Occupational History  . RETIRED    Social History Main Topics  . Smoking status: Former Smoker -- 0.50 packs/day for 40 years    Types: Cigarettes    Start date: 12/20/1983    Quit date: 02/13/2014  . Smokeless tobacco: Current User    Types: Snuff  . Alcohol Use: No     Comment: 08/24/2012 "used to drink; not anymore; stopped > 76yr ago; never had any problems w/it"  . Drug Use: Yes    Special: Marijuana     Comment: 08/24/2012 "last drug use >4-5 months ago"  . Sexual Activity: Yes   Other Topics Concern  . Not on file   Social History Narrative   Last updated: 03/15/2010   [place]  Previously worked for [employer]    Tobacco Use - 1PPD x 40+ years,  has quit x 3 days   ETOH- rare   Drugs- occasional marijuana            Filed Vitals:   06/25/15 0851  Height: '5\' 9"'$  (1.753 m)  Weight: 206 lb (93.441 kg)    PHYSICAL EXAM General: NAD HEENT: Normal. Neck: No JVD, no thyromegaly. Lungs: Clear to auscultation bilaterally with normal respiratory effort. CV: Nondisplaced PMI.  Regular rate and rhythm, normal S1/S2, no S3/S4, no murmur. No  pretibial or periankle edema.  No carotid bruit.   Abdomen: Soft, nontender, no hepatosplenomegaly, no distention.  Neurologic: Alert and oriented x 3.  Psych: Normal affect. Skin: Normal. Musculoskeletal: Normal range of motion, no gross deformities. Extremities: No clubbing or cyanosis.   ECG: Most recent ECG reviewed.      ASSESSMENT AND PLAN: 1. CAD: Stable ischemic heart disease with severe LV dysfunction. Anterolateral STEMI in 8/13 with overlapping DES to LAD. No longer on ASA (due to being on warfarin)  or Crestor (due to elevated CK). Continue sotalol 120 mg bid and Toprol-XL 100 mg daily as per EP for VT prophylaxis.   2. Chronic systolic CHF with ICD: Appears euvolemic. Not on ACEI due to history of angioedema. Will continue Lasix 20 mg daily and supplemental KCl. Will hold off on spironolactone due to previously low normal BP and CKD. Will continue metoprolol, losartan, digoxin, and Imdur.   3. Hyperlipidemia:Will obtain copy of lipids from PCP. Did not qualify for Zetia assistance program. May consider low dose simvastatin 10 mg which I discussed with him. Would then repeat lipids in 3 months. If not sufficiently reduced, would consider Repatha.  4. Interstitial lung disease with history of tobacco abuse: He has quit smoking. Followed by pulmonary. Decided not to pursue VATS.  5. Carotid stenosis: Chronic RICA occlusion with mild to moderate LICA disease. Not on ASA or statin. On warfarin. He is followed by vascular.  6. CKD stage 3: Stable. No changes to therapy.  7. PVD: Followed by vascular. On warfarin.  8. Ventricular tachycardia: Continue sotalol 120 mg bid and Toprol-XL 100 mg daily as per EP for VT prophylaxis. F/u with Dr. Rayann Heman.  Dispo: f/u 6 months.   Kate Sable, M.D., F.A.C.C.

## 2015-06-29 ENCOUNTER — Encounter: Payer: BLUE CROSS/BLUE SHIELD | Admitting: Internal Medicine

## 2015-07-20 ENCOUNTER — Encounter: Payer: BLUE CROSS/BLUE SHIELD | Admitting: Internal Medicine

## 2015-07-20 ENCOUNTER — Encounter: Payer: Self-pay | Admitting: Internal Medicine

## 2015-07-20 ENCOUNTER — Ambulatory Visit (INDEPENDENT_AMBULATORY_CARE_PROVIDER_SITE_OTHER): Payer: BLUE CROSS/BLUE SHIELD | Admitting: Internal Medicine

## 2015-07-20 VITALS — BP 130/72 | HR 74 | Ht 69.0 in | Wt 202.0 lb

## 2015-07-20 DIAGNOSIS — I472 Ventricular tachycardia, unspecified: Secondary | ICD-10-CM

## 2015-07-20 DIAGNOSIS — I5022 Chronic systolic (congestive) heart failure: Secondary | ICD-10-CM

## 2015-07-20 DIAGNOSIS — I10 Essential (primary) hypertension: Secondary | ICD-10-CM | POA: Diagnosis not present

## 2015-07-20 NOTE — Patient Instructions (Addendum)
Your physician recommends that you continue on your current medications as directed. Please refer to the Current Medication list given to you today. Remote device check 10/22/15. Your physician recommends that you schedule a follow-up appointment in: 6 month. You can schedule this appointment today or you can wait for your letter to come in the mail in about 4 months reminding you to call and schedule this appointment. If you do not receive this letter, please contact our office for your appointment.

## 2015-07-22 NOTE — Progress Notes (Signed)
Electrophysiology Office Note   Date:  07/22/2015   ID:  Adam House, DOB 07/01/1951, MRN [id number]  PCP:  Glenda Chroman., MD  Cardiologist:  Dr Bronson Ing Primary Electrophysiologist: Thompson Grayer, MD    No chief complaint on file.    History of Present Illness: Adam House is a 63 y.o. male who presents today for electrophysiology evaluation.   The patient presents for EP follow-up.  He has had no further VT. He is doing much better at this time.  Today, he denies symptoms of palpitations, chest pain,   orthopnea, PND, lower extremity edema, claudication, dizziness, presyncope, syncope, bleeding, or neurologic sequela. The patient is tolerating medications without difficulties and is otherwise without complaint today.    Past Medical History  Diagnosis Date  . CAD (coronary artery disease)     a. ant MI 1998 with PCI to LAD. b. Ant-lat MI 04/2012 tx with overlapping DES to LAD.  [name] Systolic CHF (HCC)     NYHA Class II/III  . Ischemic cardiomyopathy     EF 25-30% by echo 04/2012; [place]  . CRI (chronic renal insufficiency)   . Unspecified essential hypertension   . Impotence of organic origin   . Esophageal reflux   . Tobacco use disorder   . ICD (implantable cardiac defibrillator) in place   . Hypercholesteremia   . Heart murmur   . CVA (cerebral infarction) 11/2010  . Gout   . 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 Lung disease, interstitial ([place])   . Atrial fibrillation ([place])   . CHF (congestive heart failure) ([place])   . Stroke [facility]) ? 2006  . DVT (deep venous thrombosis) ([place])   . Myocardial infarction ([place]) 1998 and 2005  . Peripheral arterial disease [facility])    Past Surgical History  Procedure Laterality Date  . Anterior cervical decomp/discectomy fusion  2007    "put in 4 screws to hold my head on; ruptured C5; job related injury" (08/24/2012)  . Cardiac defibrillator placement  2011    [employer]  he is a MADIT RIT study patient   . Coronary angioplasty with stent placement  05/1997; 04/2012    "1 + 2; total of 3" (08/24/2012)  . Embolectomy Right 06/24/2014    Procedure: RIGHT FEMORAL EMBOLECTOMY,  ;  Surgeon: Angelia Mould, MD;  Location: [facility] OR;  Service: Vascular;  Laterality: Right;  Right Femoral Embolectomy, with bovine patch angioplasty,.  . Fasciotomy Left 06/24/2014    Procedure: FASCIOTOMY;  Surgeon: Angelia Mould, MD;  Location: [place];  Service: Vascular;  Laterality: Left;  four compartment Fasciotomy.  . Left heart catheterization with coronary angiogram N/A 04/22/2012    Procedure: LEFT HEART CATHETERIZATION WITH CORONARY ANGIOGRAM;  Surgeon: Leonie Man, MD;  Location: [facility] CATH LAB;  Service: Cardiovascular;  Laterality: N/A;  . Percutaneous coronary stent intervention (pci-s)  04/22/2012    Procedure: PERCUTANEOUS CORONARY STENT INTERVENTION (PCI-S);  Surgeon: Leonie Man, MD;  Location[facility] CATH LAB;  Service: Cardiovascular;;     Current Outpatient Prescriptions  Medication Sig Dispense Refill  . albuterol (PROVENTIL HFA;VENTOLIN HFA) 108 (90 BASE) MCG/ACT inhaler Inhale 2 puffs into the lungs every 6 (six) hours as needed for wheezing or shortness of breath. For shortness of breath 3.7 g 6  . ALPRAZolam (XANAX) 0.5 MG tablet Take 0.5 mg by mouth 2 (two) times daily as needed for anxiety.    . colchicine 0.6 MG tablet Take 1 tablet (0.6  mg total) by mouth daily as needed. For gout (Patient taking differently: Take 0.6 mg by mouth daily. For gout) 30 tablet 11  . digoxin (LANOXIN) 0.125 MG tablet Take 0.125 mg by mouth daily.    . furosemide (LASIX) 20 MG tablet Take 1 tablet by mouth daily.    [name] ipratropium (ATROVENT) 0.02 % nebulizer solution Take 0.5 mg by nebulization every 6 (six) hours as needed for wheezing or shortness of breath.    . isosorbide mononitrate (IMDUR) 30 MG 24 hr tablet Take 1 tablet (30 mg total) by mouth daily. 30 tablet 6  . losartan (COZAAR) 25 MG tablet Take 1  tablet (25 mg total) by mouth daily. 30 tablet 6  . metoprolol succinate (TOPROL XL) 100 MG 24 hr tablet Take 1 tablet (100 mg total) by mouth daily. Take with or immediately following a meal. 30 tablet 0  . nitroGLYCERIN (NITROSTAT) 0.4 MG SL tablet Place 1 tablet (0.4 mg total) under the tongue every 5 (five) minutes as needed for chest pain (up to 3 doses). 25 tablet 3  . pantoprazole (PROTONIX) 40 MG tablet Take 1 tablet (40 mg total) by mouth daily. 90 tablet 3  . potassium chloride SA (KLOR-CON M20) 20 MEQ tablet Take 1 tablet (20 mEq total) by mouth daily. 30 tablet 6  . Red Yeast Rice 600 MG CAPS Take by mouth 2 (two) times daily.    . sotalol (BETAPACE) 80 MG tablet Take 80 mg by mouth 2 (two) times daily. Take 1 & 1/2 2 times daily    . warfarin (COUMADIN) 5 MG tablet Take 1 tablet (5 mg total) by mouth daily. 30 tablet 6   No current facility-administered medications for this visit.    Allergies:   Lisinopril and Naproxen   Social History:  The patient  reports that he quit smoking about 17 months ago. His smoking use included Cigarettes. He started smoking about 31 years ago. He has a 20 pack-year smoking history. His smokeless tobacco use includes Snuff. He reports that he uses illicit drugs (Marijuana). He reports that he does not drink alcohol.   Family History:  The patient's family history includes Cancer in his mother, sister, and sister; Diabetes in his mother; Heart attack in his brother, daughter, father, and mother; Heart disease in his brother, daughter, father, mother, sister, and sister; Hyperlipidemia in his father and mother; Hypertension in his father and mother.    ROS:  Please see the history of present illness.   All other systems are reviewed and negative.    PHYSICAL EXAM: VS:  BP 130/72 mmHg  Pulse 74  Ht '5\' 9"'$  (1.753 m)  Wt 202 lb (91.627 kg)  BMI 29.82 kg/m2  SpO2 97% , BMI Body mass index is 29.82 kg/(m^2). GEN: Well nourished, well developed, in no  acute distress HEENT: normal Neck: no JVD, carotid bruits, or masses Cardiac: RRR; no murmurs, rubs, or gallops,no edema  Respiratory:  clear to auscultation bilaterally, normal work of breathing GI: soft, nontender, nondistended, + BS MS: no deformity or atrophy Skin: warm and dry, device pocket is well healed Neuro:  Strength and sensation are intact Psych: euthymic mood, full affect  EKG:  EKG is ordered today. The ekg ordered today shows sinus rhythm, stable QTc  Device interrogation is reviewed today in detail.  See PaceArt for details.   Recent Labs: No results found for requested labs within last 365 days.    Lipid Panel     Component  Value Date/Time   CHOL 148 08/25/2012 0204   TRIG 112 08/25/2012 0204   HDL 23* 08/25/2012 0204   CHOLHDL 6.4 08/25/2012 0204   VLDL 22 08/25/2012 0204   LDLCALC 103* 08/25/2012 0204     Wt Readings from Last 3 Encounters:  07/20/15 202 lb (91.627 kg)  06/25/15 206 lb (93.441 kg)  03/20/15 206 lb (93.441 kg)      ASSESSMENT AND PLAN:  1. VT Stable on sotalol '160mg'$  BID No changes today Qt is stable Normal ICD function  2. Chronic systolic dysfuntion euvolemic today No changes  3. CRI No changes  4. Afib Maintaining sinus rhythm Continue anticoagulation life long  5. Lung lesion I think that he has decides to avoid any further procedures.    Return in 6 months for further assessment Will monitor remotely with [facility] in the interim Follow-up with Dr Bronson Ing as scheduled   Current medicines are reviewed at length with the patient today.   The patient does not have concerns regarding his medicines.  The following changes were made today:  none  Signed, Thompson Grayer, MD    [place] [address] [phone] (office) [phone] (fax)

## 2015-07-23 LAB — CUP PACEART INCLINIC DEVICE CHECK
HIGH POWER IMPEDANCE MEASURED VALUE: 42 Ohm
HIGH POWER IMPEDANCE MEASURED VALUE: 59 Ohm
Implantable Lead Implant Date: 20110328
Implantable Lead Location: 753859
Implantable Lead Model: 4136
Lead Channel Impedance Value: 483 Ohm
Lead Channel Impedance Value: 565 Ohm
Lead Channel Pacing Threshold Amplitude: 1 V
Lead Channel Pacing Threshold Pulse Width: 0.4 ms
Lead Channel Sensing Intrinsic Amplitude: 9.4 mV
MDC IDC LEAD IMPLANT DT: 20110328
MDC IDC LEAD LOCATION: 753860
MDC IDC LEAD MODEL: 158
MDC IDC LEAD SERIAL: 28708916
MDC IDC LEAD SERIAL: 301516
MDC IDC MSMT LEADCHNL RV PACING THRESHOLD AMPLITUDE: 1 V
MDC IDC MSMT LEADCHNL RV PACING THRESHOLD PULSEWIDTH: 0.4 ms
MDC IDC MSMT LEADCHNL RV SENSING INTR AMPL: 13.4 mV
MDC IDC PG SERIAL: 147011
MDC IDC SESS DTM: 20161104040000
MDC IDC SET LEADCHNL RA PACING AMPLITUDE: 2 V
MDC IDC SET LEADCHNL RV PACING AMPLITUDE: 2.4 V
MDC IDC SET LEADCHNL RV PACING PULSEWIDTH: 0.4 ms
MDC IDC SET LEADCHNL RV SENSING SENSITIVITY: 0.6 mV

## 2015-07-30 ENCOUNTER — Encounter: Payer: Self-pay | Admitting: Internal Medicine

## 2015-08-08 ENCOUNTER — Telehealth: Payer: Self-pay

## 2015-08-08 MED ORDER — SIMVASTATIN 10 MG PO TABS: 10.0000 mg | ORAL_TABLET | Freq: Every day | ORAL | Status: DC

## 2015-08-08 NOTE — Telephone Encounter (Signed)
LM ,pt needs to start simvastatin 10 mg for his cholesterol after Dr Bronson Ing reviewed his lab work from his pcp, e-scribed med already

## 2015-09-24 ENCOUNTER — Other Ambulatory Visit: Payer: Self-pay | Admitting: *Deleted

## 2015-09-24 MED ORDER — SIMVASTATIN 10 MG PO TABS: 10.0000 mg | ORAL_TABLET | Freq: Every day | ORAL | Status: AC

## 2015-10-09 ENCOUNTER — Encounter: Payer: Self-pay | Admitting: Cardiovascular Disease

## 2015-10-09 ENCOUNTER — Ambulatory Visit (INDEPENDENT_AMBULATORY_CARE_PROVIDER_SITE_OTHER): Payer: BLUE CROSS/BLUE SHIELD | Admitting: Cardiovascular Disease

## 2015-10-09 VITALS — BP 109/83 | HR 71 | Ht 69.0 in | Wt 199.0 lb

## 2015-10-09 DIAGNOSIS — I519 Heart disease, unspecified: Secondary | ICD-10-CM

## 2015-10-09 DIAGNOSIS — I6523 Occlusion and stenosis of bilateral carotid arteries: Secondary | ICD-10-CM

## 2015-10-09 DIAGNOSIS — I472 Ventricular tachycardia, unspecified: Secondary | ICD-10-CM

## 2015-10-09 DIAGNOSIS — I10 Essential (primary) hypertension: Secondary | ICD-10-CM | POA: Diagnosis not present

## 2015-10-09 DIAGNOSIS — Z95810 Presence of automatic (implantable) cardiac defibrillator: Secondary | ICD-10-CM

## 2015-10-09 DIAGNOSIS — F419 Anxiety disorder, unspecified: Secondary | ICD-10-CM

## 2015-10-09 DIAGNOSIS — R0602 Shortness of breath: Secondary | ICD-10-CM | POA: Diagnosis not present

## 2015-10-09 DIAGNOSIS — I25812 Atherosclerosis of bypass graft of coronary artery of transplanted heart without angina pectoris: Secondary | ICD-10-CM

## 2015-10-09 DIAGNOSIS — E785 Hyperlipidemia, unspecified: Secondary | ICD-10-CM

## 2015-10-09 DIAGNOSIS — I5022 Chronic systolic (congestive) heart failure: Secondary | ICD-10-CM

## 2015-10-09 DIAGNOSIS — J849 Interstitial pulmonary disease, unspecified: Secondary | ICD-10-CM

## 2015-10-09 NOTE — Patient Instructions (Signed)
Continue all current medications. Follow up in  3 months 

## 2015-10-09 NOTE — Progress Notes (Signed)
Patient ID: Adam House, male   DOB: 09/05/1951, 65 y.o.   MRN: [id number]      SUBJECTIVE: Adam House presents today earlier than his scheduled follow-up appointment in May due to shortness of breath. His wife has been undergoing several health issues and recently underwent surgery and a prolonged hospitalization at [facility]. Due to all of this, he has been much more anxious recently. He has felt some shortness of breath while walking the dog which was relieved with taking Xanax. He denies chest pain and denies any symptoms which are similar to those he experienced prior to stent placement.  Wt 199 lbs (206 lbs on 06/25/15).  Review of Systems: As per "subjective", otherwise negative.  Allergies  Allergen Reactions  . Lisinopril Anaphylaxis and Swelling    angioedema  . Naproxen Hives    Current Outpatient Prescriptions  Medication Sig Dispense Refill  . albuterol (PROVENTIL HFA;VENTOLIN HFA) 108 (90 BASE) MCG/ACT inhaler Inhale 2 puffs into the lungs every 6 (six) hours as needed for wheezing or shortness of breath. For shortness of breath 3.7 g 6  . allopurinol (ZYLOPRIM) 300 MG tablet Take 300 mg by mouth daily.  4  . ALPRAZolam (XANAX) 0.5 MG tablet Take 0.5 mg by mouth 2 (two) times daily as needed for anxiety.    . colchicine 0.6 MG tablet Take 1 tablet (0.6 mg total) by mouth daily as needed. For gout (Patient taking differently: Take 0.6 mg by mouth daily. For gout) 30 tablet 11  . furosemide (LASIX) 20 MG tablet Take 1 tablet by mouth daily.    [name] ipratropium (ATROVENT) 0.02 % nebulizer solution Take 0.5 mg by nebulization every 6 (six) hours as needed for wheezing or shortness of breath.    . isosorbide mononitrate (IMDUR) 30 MG 24 hr tablet Take 1 tablet (30 mg total) by mouth daily. 30 tablet 6  . metoprolol succinate (TOPROL XL) 100 MG 24 hr tablet Take 1 tablet (100 mg total) by mouth daily. Take with or immediately following a meal. 30 tablet 0  . nitroGLYCERIN  (NITROSTAT) 0.4 MG SL tablet Place 1 tablet (0.4 mg total) under the tongue every 5 (five) minutes as needed for chest pain (up to 3 doses). 25 tablet 3  . pantoprazole (PROTONIX) 40 MG tablet Take 1 tablet (40 mg total) by mouth daily. 90 tablet 3  . potassium chloride SA (KLOR-CON M20) 20 MEQ tablet Take 1 tablet (20 mEq total) by mouth daily. 30 tablet 6  . simvastatin (ZOCOR) 10 MG tablet Take 1 tablet (10 mg total) by mouth at bedtime. 90 tablet 3  . sotalol (BETAPACE) 120 MG tablet Take 120 mg by mouth 2 (two) times daily.  4  . warfarin (COUMADIN) 5 MG tablet Take 1 tablet (5 mg total) by mouth daily. 30 tablet 6  . digoxin (LANOXIN) 0.125 MG tablet Take 0.125 mg by mouth daily. Reported on 10/09/2015    . losartan (COZAAR) 25 MG tablet Take 1 tablet (25 mg total) by mouth daily. (Patient not taking: Reported on 10/09/2015) 30 tablet 6   No current facility-administered medications for this visit.    Past Medical History  Diagnosis Date  . CAD (coronary artery disease)     a. ant MI 1998 with PCI to LAD. b. Ant-lat MI 04/2012 tx with overlapping DES to LAD.  [name] Systolic CHF (HCC)     NYHA Class II/III  . Ischemic cardiomyopathy     EF 25-30% by echo 04/2012; [place]  .  CRI (chronic renal insufficiency)   . Unspecified essential hypertension   . Impotence of organic origin   . Esophageal reflux   . Tobacco use disorder   . ICD (implantable cardiac defibrillator) in place   . Hypercholesteremia   . Heart murmur   . CVA (cerebral infarction) 11/2010  . Gout   . 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 Lung disease, interstitial ([place])   . Atrial fibrillation ([place])   . CHF (congestive heart failure) ([place])   . Stroke [facility]) ? 2006  . DVT (deep venous thrombosis) ([place])   . Myocardial infarction ([place]) 1998 and 2005  . Peripheral arterial disease [facility])     Past Surgical History  Procedure Laterality Date  . Anterior cervical decomp/discectomy fusion  2007    "put in 4  screws to hold my head on; ruptured C5; job related injury" (08/24/2012)  . Cardiac defibrillator placement  2011    [employer]  he is a MADIT RIT study patient  . Coronary angioplasty with stent placement  05/1997; 04/2012    "1 + 2; total of 3" (08/24/2012)  . Embolectomy Right 06/24/2014    Procedure: RIGHT FEMORAL EMBOLECTOMY,  ;  Surgeon: Angelia Mould, MD;  Location: [facility] OR;  Service: Vascular;  Laterality: Right;  Right Femoral Embolectomy, with bovine patch angioplasty,.  . Fasciotomy Left 06/24/2014    Procedure: FASCIOTOMY;  Surgeon: Angelia Mould, MD;  Location: [place];  Service: Vascular;  Laterality: Left;  four compartment Fasciotomy.  . Left heart catheterization with coronary angiogram N/A 04/22/2012    Procedure: LEFT HEART CATHETERIZATION WITH CORONARY ANGIOGRAM;  Surgeon: Leonie Man, MD;  Location: [facility] CATH LAB;  Service: Cardiovascular;  Laterality: N/A;  . Percutaneous coronary stent intervention (pci-s)  04/22/2012    Procedure: PERCUTANEOUS CORONARY STENT INTERVENTION (PCI-S);  Surgeon: Leonie Man, MD;  Location: [facility] CATH LAB;  Service: Cardiovascular;;    Social History   Social History  . Marital Status: Married    Spouse Name: N/A  . Number of Children: N/A  . Years of Education: N/A   Occupational History  . RETIRED    Social History Main Topics  . Smoking status: Former Smoker -- 0.50 packs/day for 40 years    Types: Cigarettes    Start date: 12/20/1983    Quit date: 02/13/2014  . Smokeless tobacco: Current User    Types: Snuff  . Alcohol Use: No     Comment: 08/24/2012 "used to drink; not anymore; stopped > 71yr ago; never had any problems w/it"  . Drug Use: Yes    Special: Marijuana     Comment: 08/24/2012 "last drug use >4-5 months ago"  . Sexual Activity: Yes   Other Topics Concern  . Not on file   Social History Narrative   Last updated: 03/15/2010   Retired [place]  Previously worked for [employer]     Tobacco Use - 1PPD x 40+ years,  has quit x 3 days   ETOH- rare   Drugs- occasional marijuana            Filed Vitals:   10/09/15 0848  BP: 109/83  Pulse: 71  Height: '5\' 9"'$  (1.753 m)  Weight: 199 lb (90.266 kg)  SpO2: 99%    PHYSICAL EXAM General: NAD HEENT: Normal. Neck: No JVD, no thyromegaly. Lungs: Clear to auscultation bilaterally with normal respiratory effort. CV: Nondisplaced PMI.  Regular rate and rhythm, normal S1/S2,  no S3/S4, no murmur. No pretibial or periankle edema.    Abdomen: Soft, no distention.  Neurologic: Alert and oriented x 3.  Psych: Normal affect. Skin: Normal. Musculoskeletal: Normal range of motion, no gross deformities. Extremities: No clubbing or cyanosis.   ECG: Most recent ECG reviewed.      ASSESSMENT AND PLAN: 1. CAD: Stable ischemic heart disease with severe LV dysfunction. Anterolateral STEMI in 04/2012 with overlapping DES to LAD. Not on ASA (due to being on warfarin) or Crestor (due to elevated CK in the past). Continue sotalol 120 mg bid and Toprol-XL 100 mg daily as per EP for VT prophylaxis.   2. SOB/Chronic systolic CHF with ICD: Appears euvolemic. Symptoms do not appear to be consistent with heart failure, and more likely due to anxiety. Not on ACEI due to history of angioedema. Will continue Lasix 20 mg daily and supplemental KCl. Will hold off on spironolactone due to low normal BP and CKD. Will continue metoprolol, losartan, digoxin, and Imdur.   3. Hyperlipidemia:Now on simvastatin 10 mg. I will repeat in April. If not sufficiently reduced, would consider Repatha.  4. Interstitial lung disease with history of tobacco abuse: He has quit smoking. Followed by pulmonary. Decided not to pursue VATS.  5. Carotid stenosis: Chronic RICA occlusion with mild to moderate LICA disease. Not on ASA or statin. On warfarin. He is followed by vascular.  6. CKD stage 3: Stable. No changes to therapy.  7. PVD: Followed by vascular. On  warfarin.  8. Ventricular tachycardia: Continue sotalol 120 mg bid and Toprol-XL 100 mg daily as per EP for VT prophylaxis. F/u with Dr. Rayann Heman.  Dispo: f/u April.   Kate Sable, M.D., F.A.C.C.

## 2015-10-22 ENCOUNTER — Ambulatory Visit (INDEPENDENT_AMBULATORY_CARE_PROVIDER_SITE_OTHER): Payer: BLUE CROSS/BLUE SHIELD | Admitting: *Deleted

## 2015-10-22 DIAGNOSIS — I255 Ischemic cardiomyopathy: Secondary | ICD-10-CM | POA: Diagnosis not present

## 2015-10-23 NOTE — Progress Notes (Signed)
Remote ICD transmission.   

## 2015-10-24 ENCOUNTER — Other Ambulatory Visit: Payer: Self-pay | Admitting: *Deleted

## 2015-10-24 MED ORDER — ISOSORBIDE MONONITRATE ER 30 MG PO TB24: 30.0000 mg | ORAL_TABLET | Freq: Every day | ORAL | Status: AC

## 2015-11-13 ENCOUNTER — Other Ambulatory Visit: Payer: Self-pay | Admitting: *Deleted

## 2015-11-13 MED ORDER — FUROSEMIDE 20 MG PO TABS: 20.0000 mg | ORAL_TABLET | Freq: Every day | ORAL | Status: AC

## 2015-11-17 LAB — CUP PACEART REMOTE DEVICE CHECK
Brady Statistic RV Percent Paced: 0 %
HIGH POWER IMPEDANCE MEASURED VALUE: 63 Ohm
Implantable Lead Implant Date: 20110328
Implantable Lead Location: 753859
Implantable Lead Model: 4136
Implantable Lead Serial Number: 28708916
Lead Channel Pacing Threshold Amplitude: 1 V
Lead Channel Pacing Threshold Amplitude: 1 V
Lead Channel Pacing Threshold Pulse Width: 0.4 ms
Lead Channel Pacing Threshold Pulse Width: 0.4 ms
Lead Channel Setting Sensing Sensitivity: 0.6 mV
MDC IDC LEAD IMPLANT DT: 20110328
MDC IDC LEAD LOCATION: 753860
MDC IDC LEAD MODEL: 158
MDC IDC LEAD SERIAL: 301516
MDC IDC MSMT BATTERY REMAINING LONGEVITY: 66 mo
MDC IDC MSMT BATTERY REMAINING PERCENTAGE: 80 %
MDC IDC MSMT LEADCHNL RA IMPEDANCE VALUE: 586 Ohm
MDC IDC MSMT LEADCHNL RV IMPEDANCE VALUE: 511 Ohm
MDC IDC PG SERIAL: 147011
MDC IDC SESS DTM: 20170206083000
MDC IDC SET LEADCHNL RA PACING AMPLITUDE: 2 V
MDC IDC SET LEADCHNL RV PACING AMPLITUDE: 2.4 V
MDC IDC SET LEADCHNL RV PACING PULSEWIDTH: 0.4 ms
MDC IDC STAT BRADY RA PERCENT PACED: 0 %

## 2015-11-17 NOTE — Progress Notes (Signed)
Normal remote reviewed.  Next follow up 01/18/16 [facility]

## 2015-11-18 ENCOUNTER — Encounter: Payer: Self-pay | Admitting: Cardiology

## 2015-11-28 ENCOUNTER — Other Ambulatory Visit: Payer: Self-pay | Admitting: *Deleted

## 2015-11-28 MED ORDER — PANTOPRAZOLE SODIUM 40 MG PO TBEC: 40.0000 mg | DELAYED_RELEASE_TABLET | Freq: Every day | ORAL | Status: DC

## 2015-12-25 ENCOUNTER — Ambulatory Visit (INDEPENDENT_AMBULATORY_CARE_PROVIDER_SITE_OTHER): Payer: Medicare Other | Admitting: Cardiovascular Disease

## 2015-12-25 ENCOUNTER — Encounter: Payer: Self-pay | Admitting: Cardiovascular Disease

## 2015-12-25 VITALS — BP 106/68 | HR 73 | Ht 69.0 in | Wt 206.0 lb

## 2015-12-25 DIAGNOSIS — I10 Essential (primary) hypertension: Secondary | ICD-10-CM

## 2015-12-25 DIAGNOSIS — I5022 Chronic systolic (congestive) heart failure: Secondary | ICD-10-CM

## 2015-12-25 DIAGNOSIS — I472 Ventricular tachycardia, unspecified: Secondary | ICD-10-CM

## 2015-12-25 DIAGNOSIS — I519 Heart disease, unspecified: Secondary | ICD-10-CM

## 2015-12-25 DIAGNOSIS — N183 Chronic kidney disease, stage 3 unspecified: Secondary | ICD-10-CM

## 2015-12-25 DIAGNOSIS — I6523 Occlusion and stenosis of bilateral carotid arteries: Secondary | ICD-10-CM

## 2015-12-25 DIAGNOSIS — I25812 Atherosclerosis of bypass graft of coronary artery of transplanted heart without angina pectoris: Secondary | ICD-10-CM

## 2015-12-25 DIAGNOSIS — R0602 Shortness of breath: Secondary | ICD-10-CM

## 2015-12-25 DIAGNOSIS — E785 Hyperlipidemia, unspecified: Secondary | ICD-10-CM

## 2015-12-25 DIAGNOSIS — Z95810 Presence of automatic (implantable) cardiac defibrillator: Secondary | ICD-10-CM

## 2015-12-25 DIAGNOSIS — J849 Interstitial pulmonary disease, unspecified: Secondary | ICD-10-CM

## 2015-12-25 DIAGNOSIS — I5189 Other ill-defined heart diseases: Secondary | ICD-10-CM

## 2015-12-25 NOTE — Addendum Note (Signed)
Addended by: Laurine Blazer on: 12/25/2015 09:08 AM   Modules accepted: Orders, Medications

## 2015-12-25 NOTE — Patient Instructions (Addendum)
Remain off of the Digoxin. Continue all other current medications. Your physician wants you to follow up in: 6 months.  You will receive a reminder letter in the mail one-two months in advance.  If you don't receive a letter, please call our office to schedule the follow up appointment

## 2015-12-25 NOTE — Progress Notes (Signed)
Patient ID: Adam House, male   DOB: March 23, 1951, 65 y.o.   MRN: [id number]      SUBJECTIVE: Mr. Waiters returns for routine follow up. Wt 206 lbs (199 10/09/15). However, home scales shows overall weight stability at 198 pounds. He primarily gets short of breath when he is anxious, walking up hills, or has long conversations. He feels it is stable. He denies chest pain and palpitations. He does have a history of interstitial lung disease and previously refused VATS.   Review of Systems: As per "subjective", otherwise negative.  Allergies  Allergen Reactions  . Lisinopril Anaphylaxis and Swelling    angioedema  . Naproxen Hives    Current Outpatient Prescriptions  Medication Sig Dispense Refill  . albuterol (PROVENTIL HFA;VENTOLIN HFA) 108 (90 BASE) MCG/ACT inhaler Inhale 2 puffs into the lungs every 6 (six) hours as needed for wheezing or shortness of breath. For shortness of breath 3.7 g 6  . allopurinol (ZYLOPRIM) 300 MG tablet Take 300 mg by mouth daily.  4  . ALPRAZolam (XANAX) 0.5 MG tablet Take 0.5 mg by mouth 2 (two) times daily as needed for anxiety.    . colchicine 0.6 MG tablet Take 1 tablet (0.6 mg total) by mouth daily as needed. For gout (Patient taking differently: Take 0.6 mg by mouth daily. For gout) 30 tablet 11  . furosemide (LASIX) 20 MG tablet Take 1 tablet (20 mg total) by mouth daily. 90 tablet 3  . ipratropium (ATROVENT) 0.02 % nebulizer solution Take 0.5 mg by nebulization every 6 (six) hours as needed for wheezing or shortness of breath.    . isosorbide mononitrate (IMDUR) 30 MG 24 hr tablet Take 1 tablet (30 mg total) by mouth daily. 90 tablet 3  . losartan (COZAAR) 25 MG tablet Take 1 tablet (25 mg total) by mouth daily. 30 tablet 6  . nitroGLYCERIN (NITROSTAT) 0.4 MG SL tablet Place 1 tablet (0.4 mg total) under the tongue every 5 (five) minutes as needed for chest pain (up to 3 doses). 25 tablet 3  . omeprazole (PRILOSEC) 20 MG capsule Take 20 mg by  mouth daily.  2  . potassium chloride SA (KLOR-CON M20) 20 MEQ tablet Take 1 tablet (20 mEq total) by mouth daily. 30 tablet 6  . simvastatin (ZOCOR) 10 MG tablet Take 1 tablet (10 mg total) by mouth at bedtime. 90 tablet 3  . sotalol (BETAPACE) 120 MG tablet Take 120 mg by mouth 2 (two) times daily.  4  . warfarin (COUMADIN) 5 MG tablet Take 1 tablet (5 mg total) by mouth daily. 30 tablet 6  . digoxin (LANOXIN) 0.125 MG tablet Take 0.125 mg by mouth daily. Reported on 12/25/2015    . metoprolol succinate (TOPROL XL) 100 MG 24 hr tablet Take 1 tablet (100 mg total) by mouth daily. Take with or immediately following a meal. (Patient not taking: Reported on 12/25/2015) 30 tablet 0   No current facility-administered medications for this visit.    Past Medical History  Diagnosis Date  . CAD (coronary artery disease)     a. ant MI 1998 with PCI to LAD. b. Ant-lat MI 04/2012 tx with overlapping DES to LAD.  [name] Systolic CHF (HCC)     NYHA Class II/III  . Ischemic cardiomyopathy     EF 25-30% by echo 04/2012; [place]  . CRI (chronic renal insufficiency)   . Unspecified essential hypertension   . Impotence of organic origin   . Esophageal reflux   .  Tobacco use disorder   . ICD (implantable cardiac defibrillator) in place   . Hypercholesteremia   . Heart murmur   . CVA (cerebral infarction) 11/2010  . Gout   . 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 Lung disease, interstitial ([place])   . Atrial fibrillation ([place])   . CHF (congestive heart failure) ([place])   . Stroke [facility]) ? 2006  . DVT (deep venous thrombosis) ([place])   . Myocardial infarction ([place]) 1998 and 2005  . Peripheral arterial disease [facility])     Past Surgical History  Procedure Laterality Date  . Anterior cervical decomp/discectomy fusion  2007    "put in 4 screws to hold my head on; ruptured C5; job related injury" (08/24/2012)  . Cardiac defibrillator placement  2011    [employer]  he is a MADIT RIT study  patient  . Coronary angioplasty with stent placement  05/1997; 04/2012    "1 + 2; total of 3" (08/24/2012)  . Embolectomy Right 06/24/2014    Procedure: RIGHT FEMORAL EMBOLECTOMY,  ;  Surgeon: Angelia Mould, MD;  Location: [facility] OR;  Service: Vascular;  Laterality: Right;  Right Femoral Embolectomy, with bovine patch angioplasty,.  . Fasciotomy Left 06/24/2014    Procedure: FASCIOTOMY;  Surgeon: Angelia Mould, MD;  Location: [place];  Service: Vascular;  Laterality: Left;  four compartment Fasciotomy.  . Left heart catheterization with coronary angiogram N/A 04/22/2012    Procedure: LEFT HEART CATHETERIZATION WITH CORONARY ANGIOGRAM;  Surgeon: Leonie Man, MD;  Location: [facility] CATH LAB;  Service: Cardiovascular;  Laterality: N/A;  . Percutaneous coronary stent intervention (pci-s)  04/22/2012    Procedure: PERCUTANEOUS CORONARY STENT INTERVENTION (PCI-S);  Surgeon: Leonie Man, MD;  Location: [facility] CATH LAB;  Service: Cardiovascular;;    Social History   Social History  . Marital Status: Married    Spouse Name: N/A  . Number of Children: N/A  . Years of Education: N/A   Occupational History  . RETIRED    Social History Main Topics  . Smoking status: Former Smoker -- 0.50 packs/day for 40 years    Types: Cigarettes    Start date: 12/20/1983    Quit date: 02/13/2014  . Smokeless tobacco: Current User    Types: Snuff  . Alcohol Use: No     Comment: 08/24/2012 "used to drink; not anymore; stopped > 53yr ago; never had any problems w/it"  . Drug Use: Yes    Special: Marijuana     Comment: 08/24/2012 "last drug use >4-5 months ago"  . Sexual Activity: Yes   Other Topics Concern  . Not on file   Social History Narrative   Last updated: 03/15/2010   [place]ired and [place]  Previously worked for [employer]    Tobacco Use - 1PPD x 40+ years,  has quit x 3 days   ETOH- rare   Drugs- occasional marijuana            Filed Vitals:   12/25/15 0840  BP: 106/68    Pulse: 73  Height: '5\' 9"'$  (1.753 m)  Weight: 206 lb (93.441 kg)    PHYSICAL EXAM General: NAD HEENT: Normal. Neck: No JVD, no thyromegaly. Lungs: Clear to auscultation bilaterally with normal respiratory effort. CV: Nondisplaced PMI.  Regular rate and rhythm, normal S1/S2, no S3/S4, no murmur. No pretibial or periankle edema.  No carotid bruit.   Abdomen: Soft, nontender, no distention.  Neurologic: Alert and oriented.  Psych:  Normal affect. Skin: Normal. Musculoskeletal: No gross deformities.  ECG: Most recent ECG reviewed.      ASSESSMENT AND PLAN: 1. CAD: Stable ischemic heart disease with severe LV dysfunction. Anterolateral STEMI in 04/2012 with overlapping DES to LAD. Not on ASA (due to being on warfarin) or Crestor (due to elevated CK in the past). Continue sotalol 120 mg bid and Toprol-XL 100 mg daily as per EP for VT prophylaxis.   2. SOB/Interstitial lung disease/Chronic systolic CHF with ICD: Appears euvolemic with NYHA class II symptoms. Not on ACEI due to history of angioedema. Will continue Lasix 20 mg daily and supplemental KCl. Will hold off on spironolactone due to low normal BP and CKD. Will continue metoprolol, losartan, digoxin, and Imdur.   3. Hyperlipidemia:On simvastatin 10 mg. I will repeat at next visit. If not sufficiently reduced, would consider Repatha.  4. Interstitial lung disease with history of tobacco abuse: He has quit smoking. Followed by pulmonary. Decided not to pursue VATS.  5. Carotid stenosis: Chronic RICA occlusion with mild to moderate LICA disease. Not on ASA or statin. On warfarin. He is followed by vascular.  6. CKD stage 3: Stable. No changes to therapy.  7. PVD: Followed by vascular. On warfarin.  8. Ventricular tachycardia: Continue sotalol 120 mg bid and Toprol-XL 100 mg daily as per EP for VT prophylaxis. F/u with Dr. Rayann Heman.  Dispo: f/u 6 months.   Kate Sable, M.D., F.A.C.C.

## 2016-01-18 ENCOUNTER — Ambulatory Visit (INDEPENDENT_AMBULATORY_CARE_PROVIDER_SITE_OTHER): Payer: Medicare Other | Admitting: Internal Medicine

## 2016-01-18 ENCOUNTER — Encounter: Payer: Self-pay | Admitting: Internal Medicine

## 2016-01-18 VITALS — BP 130/83 | HR 76 | Ht 69.0 in | Wt 201.0 lb

## 2016-01-18 DIAGNOSIS — I4891 Unspecified atrial fibrillation: Secondary | ICD-10-CM | POA: Insufficient documentation

## 2016-01-18 DIAGNOSIS — I472 Ventricular tachycardia, unspecified: Secondary | ICD-10-CM

## 2016-01-18 DIAGNOSIS — Z95810 Presence of automatic (implantable) cardiac defibrillator: Secondary | ICD-10-CM | POA: Diagnosis not present

## 2016-01-18 DIAGNOSIS — I481 Persistent atrial fibrillation: Secondary | ICD-10-CM

## 2016-01-18 DIAGNOSIS — I255 Ischemic cardiomyopathy: Secondary | ICD-10-CM | POA: Diagnosis not present

## 2016-01-18 DIAGNOSIS — I10 Essential (primary) hypertension: Secondary | ICD-10-CM | POA: Diagnosis not present

## 2016-01-18 DIAGNOSIS — I4819 Other persistent atrial fibrillation: Secondary | ICD-10-CM

## 2016-01-18 LAB — CUP PACEART INCLINIC DEVICE CHECK
Date Time Interrogation Session: 20170505040000
HIGH POWER IMPEDANCE MEASURED VALUE: 42 Ohm
HighPow Impedance: 71 Ohm
Implantable Lead Implant Date: 20110328
Implantable Lead Location: 753859
Lead Channel Impedance Value: 548 Ohm
Lead Channel Impedance Value: 677 Ohm
Lead Channel Pacing Threshold Amplitude: 1 V
Lead Channel Sensing Intrinsic Amplitude: 14 mV
Lead Channel Sensing Intrinsic Amplitude: 18.2 mV
Lead Channel Setting Pacing Amplitude: 2 V
Lead Channel Setting Sensing Sensitivity: 0.6 mV
MDC IDC LEAD IMPLANT DT: 20110328
MDC IDC LEAD LOCATION: 753860
MDC IDC LEAD MODEL: 158
MDC IDC LEAD MODEL: 4136
MDC IDC LEAD SERIAL: 28708916
MDC IDC LEAD SERIAL: 301516
MDC IDC MSMT LEADCHNL RA PACING THRESHOLD PULSEWIDTH: 0.4 ms
MDC IDC MSMT LEADCHNL RV PACING THRESHOLD AMPLITUDE: 1 V
MDC IDC MSMT LEADCHNL RV PACING THRESHOLD PULSEWIDTH: 0.4 ms
MDC IDC SET LEADCHNL RV PACING AMPLITUDE: 2.4 V
MDC IDC SET LEADCHNL RV PACING PULSEWIDTH: 0.4 ms
Pulse Gen Serial Number: 147011

## 2016-01-18 NOTE — Progress Notes (Signed)
Electrophysiology Office Note   Date:  01/18/2016   ID:  Adam House, DOB 26-Feb-1951, MRN [id number]  PCP:  Glenda Chroman, MD  Cardiologist:  Dr Bronson Ing Primary Electrophysiologist: Thompson Grayer, MD    Chief Complaint  Patient presents with  . vt     History of Present Illness: Adam House is a 65 y.o. male who presents today for electrophysiology evaluation.   The patient presents for EP follow-up.  He has had no further VT. He is doing much better at this time.  Still dips tobacco but is not smoking.  He has had problems with allergies over the past month.  ALso has gout.  Today, he denies symptoms of palpitations, chest pain,   orthopnea, PND, lower extremity edema, claudication, dizziness, presyncope, syncope, bleeding, or neurologic sequela. The patient is tolerating medications without difficulties and is otherwise without complaint today.    Past Medical History  Diagnosis Date  . CAD (coronary artery disease)     a. ant MI 1998 with PCI to LAD. b. Ant-lat MI 04/2012 tx with overlapping DES to LAD.  [name] Systolic CHF (HCC)     NYHA Class II/III  . Ischemic cardiomyopathy     EF 25-30% by echo 04/2012; [place]  . CRI (chronic renal insufficiency)   . Unspecified essential hypertension   . Impotence of organic origin   . Esophageal reflux   . Tobacco use disorder   . ICD (implantable cardiac defibrillator) in place   . Hypercholesteremia   . Heart murmur   . CVA (cerebral infarction) 11/2010  . Gout   . 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 Lung disease, interstitial ([place])   . Atrial fibrillation ([place])   . CHF (congestive heart failure) ([place])   . Stroke [facility]) ? 2006  . DVT (deep venous thrombosis) ([place])   . Myocardial infarction ([place]) 1998 and 2005  . Peripheral arterial disease [facility])    Past Surgical History  Procedure Laterality Date  . Anterior cervical decomp/discectomy fusion  2007    "put in 4 screws to hold my head on; ruptured  C5; job related injury" (08/24/2012)  . Cardiac defibrillator placement  2011    [employer]  he is a MADIT RIT study patient  . Coronary angioplasty with stent placement  05/1997; 04/2012    "1 + 2; total of 3" (08/24/2012)  . Embolectomy Right 06/24/2014    Procedure: RIGHT FEMORAL EMBOLECTOMY,  ;  Surgeon: Angelia Mould, MD;  Location: [facility] OR;  Service: Vascular;  Laterality: Right;  Right Femoral Embolectomy, with bovine patch angioplasty,.  . Fasciotomy Left 06/24/2014    Procedure: FASCIOTOMY;  Surgeon: Angelia Mould, MD;  Location: [place];  Service: Vascular;  Laterality: Left;  four compartment Fasciotomy.  . Left heart catheterization with coronary angiogram N/A 04/22/2012    Procedure: LEFT HEART CATHETERIZATION WITH CORONARY ANGIOGRAM;  Surgeon: Leonie Man, MD;  Location: [facility] CATH LAB;  Service: Cardiovascular;  Laterality: N/A;  . Percutaneous coronary stent intervention (pci-s)  04/22/2012    Procedure: PERCUTANEOUS CORONARY STENT INTERVENTION (PCI-S);  Surgeon: Leonie Man, MD;  Location: [facility] CATH LAB;  Service: Cardiovascular;;     Current Outpatient Prescriptions  Medication Sig Dispense Refill  . albuterol (PROVENTIL HFA;VENTOLIN HFA) 108 (90 BASE) MCG/ACT inhaler Inhale 2 puffs into the lungs every 6 (six) hours as needed for wheezing or shortness of breath. For shortness of breath 3.7 g 6  . allopurinol (  ZYLOPRIM) 300 MG tablet Take 300 mg by mouth daily.  4  . ALPRAZolam (XANAX) 0.5 MG tablet Take 0.5 mg by mouth 2 (two) times daily as needed for anxiety.    . furosemide (LASIX) 20 MG tablet Take 1 tablet (20 mg total) by mouth daily. 90 tablet 3  . ipratropium (ATROVENT) 0.02 % nebulizer solution Take 0.5 mg by nebulization every 6 (six) hours as needed for wheezing or shortness of breath.    . isosorbide mononitrate (IMDUR) 30 MG 24 hr tablet Take 1 tablet (30 mg total) by mouth daily. 90 tablet 3  . losartan (COZAAR) 25 MG tablet Take 1 tablet (25  mg total) by mouth daily. 30 tablet 6  . nitroGLYCERIN (NITROSTAT) 0.4 MG SL tablet Place 1 tablet (0.4 mg total) under the tongue every 5 (five) minutes as needed for chest pain (up to 3 doses). 25 tablet 3  . omeprazole (PRILOSEC) 20 MG capsule Take 20 mg by mouth daily.  2  . potassium chloride SA (KLOR-CON M20) 20 MEQ tablet Take 1 tablet (20 mEq total) by mouth daily. 30 tablet 6  . simvastatin (ZOCOR) 10 MG tablet Take 1 tablet (10 mg total) by mouth at bedtime. 90 tablet 3  . sotalol (BETAPACE) 120 MG tablet Take 120 mg by mouth 2 (two) times daily.  4  . ULORIC 40 MG tablet Take 1 tablet by mouth daily.  3  . warfarin (COUMADIN) 5 MG tablet Take 1 tablet (5 mg total) by mouth daily. 30 tablet 6   No current facility-administered medications for this visit.    Allergies:   Lisinopril and Naproxen   Social History:  The patient  reports that he quit smoking about 23 months ago. His smoking use included Cigarettes. He started smoking about 32 years ago. He has a 20 pack-year smoking history. His smokeless tobacco use includes Snuff. He reports that he uses illicit drugs (Marijuana). He reports that he does not drink alcohol.   Family History:  The patient's family history includes Cancer in his mother, sister, and sister; Diabetes in his mother; Heart attack in his brother, daughter, father, and mother; Heart disease in his brother, daughter, father, mother, sister, and sister; Hyperlipidemia in his father and mother; Hypertension in his father and mother.    ROS:  Please see the history of present illness.   All other systems are reviewed and negative.    PHYSICAL EXAM: VS:  BP 130/83 mmHg  Pulse 76  Ht '5\' 9"'$  (1.753 m)  Wt 201 lb (91.173 kg)  BMI 29.67 kg/m2 , BMI Body mass index is 29.67 kg/(m^2). GEN: Well nourished, well developed, in no acute distress HEENT: normal Neck: no JVD, carotid bruits, or masses Cardiac: RRR; no murmurs, rubs, or gallops,no edema  Respiratory:   clear to auscultation bilaterally, normal work of breathing GI: soft, nontender, nondistended, + BS MS: no deformity or atrophy Skin: warm and dry, device pocket is well healed Neuro:  Strength and sensation are intact Psych: euthymic mood, full affect  EKG:  EKG is ordered today. The ekg ordered today shows sinus rhythm 74 bpm, PR 218 msec, QRS 90 msec, Qtc 446 msec, inferior and anterior infarct pattern  Device interrogation is reviewed today in detail.  See PaceArt for details.   Recent Labs: No results found for requested labs within last 365 days.    Lipid Panel     Component Value Date/Time   CHOL 148 08/25/2012 0204   TRIG 112 08/25/2012  0204   HDL 23* 08/25/2012 0204   CHOLHDL 6.4 08/25/2012 0204   VLDL 22 08/25/2012 0204   LDLCALC 103* 08/25/2012 0204     Wt Readings from Last 3 Encounters:  01/18/16 201 lb (91.173 kg)  12/25/15 206 lb (93.441 kg)  10/09/15 199 lb (90.266 kg)     labs from PCP 4/17 glc 99, k 4.4, creat 1.5, tchol 174, TG 168, HDL 23, LDL 117 WBC 9.3, Hb 16, Hct 48, plt 276, ths 4.3 ASSESSMENT AND PLAN:  1. VT Stable on sotalol '120mg'$  BID No changes today Qt is stable Normal ICD function  2. Chronic systolic dysfuntion euvolemic today No changes  3. CRI No changes  4. Afib Maintaining sinus rhythm Continue anticoagulation life long  5. Lung lesion I think that he has decided to avoid any further procedures.   Will monitor remotely with [facility] in the interim Follow-up with Dr Bronson Ing as scheduled I will see him in a year   Current medicines are reviewed at length with the patient today.   The patient does not have concerns regarding his medicines.  The following changes were made today:  none  Signed, Thompson Grayer, MD    [place] [address] [phone] (office) [phone] (fax)

## 2016-01-18 NOTE — Patient Instructions (Addendum)
Your physician recommends that you continue on your current medications as directed. Please refer to the Current Medication list given to you today. Device check on 04/21/16. Your physician recommends that you schedule a follow-up appointment in: 1 year with Dr. Rayann Heman. Please schedule this appointment today.

## 2016-03-12 ENCOUNTER — Encounter: Payer: Self-pay | Admitting: Family

## 2016-03-12 ENCOUNTER — Other Ambulatory Visit: Payer: Self-pay | Admitting: Cardiovascular Disease

## 2016-03-21 ENCOUNTER — Encounter: Payer: Self-pay | Admitting: Family

## 2016-03-24 ENCOUNTER — Ambulatory Visit (HOSPITAL_COMMUNITY): Payer: Medicare Other

## 2016-03-24 ENCOUNTER — Ambulatory Visit: Payer: Medicare Other | Admitting: Family

## 2016-04-21 ENCOUNTER — Ambulatory Visit (INDEPENDENT_AMBULATORY_CARE_PROVIDER_SITE_OTHER): Payer: Medicare Other | Admitting: *Deleted

## 2016-04-21 DIAGNOSIS — I255 Ischemic cardiomyopathy: Secondary | ICD-10-CM | POA: Diagnosis not present

## 2016-04-21 NOTE — Progress Notes (Signed)
Remote ICD transmission.   

## 2016-04-23 ENCOUNTER — Encounter: Payer: Self-pay | Admitting: Cardiology

## 2016-04-25 LAB — CUP PACEART REMOTE DEVICE CHECK
Brady Statistic RA Percent Paced: 0 %
HighPow Impedance: 65 Ohm
Implantable Lead Implant Date: 20110328
Implantable Lead Location: 753859
Implantable Lead Model: 158
Implantable Lead Serial Number: 28708916
Lead Channel Impedance Value: 502 Ohm
Lead Channel Impedance Value: 642 Ohm
Lead Channel Pacing Threshold Amplitude: 1 V
Lead Channel Pacing Threshold Pulse Width: 0.4 ms
Lead Channel Setting Pacing Amplitude: 2 V
MDC IDC LEAD IMPLANT DT: 20110328
MDC IDC LEAD LOCATION: 753860
MDC IDC LEAD MODEL: 4136
MDC IDC LEAD SERIAL: 301516
MDC IDC MSMT BATTERY REMAINING LONGEVITY: 60 mo
MDC IDC MSMT BATTERY REMAINING PERCENTAGE: 73 %
MDC IDC MSMT LEADCHNL RA PACING THRESHOLD AMPLITUDE: 1 V
MDC IDC MSMT LEADCHNL RV PACING THRESHOLD PULSEWIDTH: 0.4 ms
MDC IDC SESS DTM: 20170807070400
MDC IDC SET LEADCHNL RV PACING AMPLITUDE: 2.4 V
MDC IDC SET LEADCHNL RV PACING PULSEWIDTH: 0.4 ms
MDC IDC SET LEADCHNL RV SENSING SENSITIVITY: 0.6 mV
MDC IDC STAT BRADY RV PERCENT PACED: 0 %
Pulse Gen Serial Number: 147011

## 2016-04-29 ENCOUNTER — Other Ambulatory Visit: Payer: Self-pay | Admitting: Cardiovascular Disease

## 2016-05-13 ENCOUNTER — Other Ambulatory Visit: Payer: Self-pay | Admitting: *Deleted

## 2016-05-13 MED ORDER — ALBUTEROL SULFATE HFA 108 (90 BASE) MCG/ACT IN AERS: 2.0000 | INHALATION_SPRAY | Freq: Four times a day (QID) | RESPIRATORY_TRACT | 1 refills | Status: AC | PRN

## 2016-07-04 LAB — PROTIME-INR: INR: 5.8 — AB (ref 0.9–1.1)

## 2016-07-11 LAB — PROTIME-INR: INR: 3.1 — AB (ref 0.9–1.1)

## 2016-07-21 ENCOUNTER — Ambulatory Visit (INDEPENDENT_AMBULATORY_CARE_PROVIDER_SITE_OTHER): Payer: Medicare Other | Admitting: *Deleted

## 2016-07-21 DIAGNOSIS — I255 Ischemic cardiomyopathy: Secondary | ICD-10-CM | POA: Diagnosis not present

## 2016-07-21 NOTE — Progress Notes (Signed)
Remote ICD transmission.   

## 2016-07-23 ENCOUNTER — Telehealth: Payer: Self-pay | Admitting: *Deleted

## 2016-07-23 ENCOUNTER — Encounter: Payer: Self-pay | Admitting: Cardiology

## 2016-07-23 NOTE — Telephone Encounter (Signed)
Ask patient if any weight gain or edema. If so can take extra Lasix 20 mg.

## 2016-07-23 NOTE — Telephone Encounter (Signed)
[facility] nurse called c/o increase SOB during the day and waking up w SOB - c/o productive cough - went to [facility] for pneumonia last - continuing to feel weak/tired. Has f/u 11/24 th with Dr. Woody Seller - will forward to covering provider for Dr. Bronson Ing

## 2016-07-24 NOTE — Telephone Encounter (Addendum)
Received call back this morning from [place] (patient on the line as well).  Informed both of reply below & patient stated that he has not had any edema or weight gain.  Stated his SOB has increased, really tired - not sleeping good at night, and has very green productive cough.  Advised patient to call Dr. Woody Seller office for visit today for evaluation in light of recent pneumonia.  Patient & [facility] nurse verbalized understanding & stated she will also assist patient in getting that appointment today.

## 2016-07-30 ENCOUNTER — Ambulatory Visit (INDEPENDENT_AMBULATORY_CARE_PROVIDER_SITE_OTHER): Payer: Medicare Other | Admitting: Pulmonary Disease

## 2016-07-30 ENCOUNTER — Other Ambulatory Visit (INDEPENDENT_AMBULATORY_CARE_PROVIDER_SITE_OTHER): Payer: Medicare Other

## 2016-07-30 ENCOUNTER — Encounter: Payer: Self-pay | Admitting: Pulmonary Disease

## 2016-07-30 VITALS — BP 116/82 | HR 76 | Ht 69.5 in | Wt 194.8 lb

## 2016-07-30 DIAGNOSIS — J849 Interstitial pulmonary disease, unspecified: Secondary | ICD-10-CM | POA: Diagnosis not present

## 2016-07-30 LAB — C-REACTIVE PROTEIN: CRP: 1.1 mg/dL (ref 0.5–20.0)

## 2016-07-30 LAB — CK TOTAL AND CKMB (NOT AT ARMC)
CK, MB: 3.9 ng/mL (ref 0.0–5.0)
Relative Index: 5.3 — ABNORMAL HIGH (ref 0.0–4.0)
Total CK: 74 U/L (ref 7–232)

## 2016-07-30 LAB — SEDIMENTATION RATE: Sed Rate: 25 mm/hr — ABNORMAL HIGH (ref 0–20)

## 2016-07-30 NOTE — Progress Notes (Signed)
Patient ID: Adam House, male   DOB: 12-May-1951, 65 y.o.   MRN: [id number]               Adam House    [id number]    1951/02/25  Primary Care Physician:Dhruv Barbette Reichmann, MD  Referring Physician: Glenda Chroman, MD [place], [place] [address]  Chief complaint:  Consult for evaluation of interstitial lung disease, COPD  HPI: Adam House is a 65 year old with past medical history of COPD, lung fibrosis. He was evaluated by Dr. 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 There is no clear evidence of honeycombing and there was groundglass opacities in the left upper lobe that suggested alternate diagnosis. He had a ILD panel that showed mild elevation in c ANCA. There is also elevation in CK that was thought to be secondary to crestor. This was held with improvement in CKs. VATS lung biopsy was recommended for further evaluation. He was seen by Dr. Blase Mess but he declined due to the fact that he had issues with DVT at that same time and had to undergo thrombectomy.  He has been referred back for progressive increase in dyspnea on exertion. He had an episode of pneumonia last month that was treated with antibiotics, prednisone taper.    Outpatient Encounter Prescriptions as of 07/30/2016  Medication Sig  . albuterol (PROVENTIL HFA;VENTOLIN HFA) 108 (90 Base) MCG/ACT inhaler Inhale 2 puffs into the lungs every 6 (six) hours as needed for wheezing or shortness of breath.  . allopurinol (ZYLOPRIM) 300 MG tablet Take 300 mg by mouth daily.  [name] ALPRAZolam (XANAX) 0.5 MG tablet Take 0.5 mg by mouth 2 (two) times daily as needed for anxiety.  . colchicine 0.6 MG tablet Take 0.6 mg by mouth daily.  . furosemide (LASIX) 20 MG tablet Take 1 tablet (20 mg total) by mouth daily.  [name] ipratropium (ATROVENT) 0.02 % nebulizer solution Take 0.5 mg by nebulization every 6 (six) hours as needed for wheezing or shortness of breath.  . isosorbide mononitrate (IMDUR) 30 MG  24 hr tablet Take 1 tablet (30 mg total) by mouth daily.  [name] losartan (COZAAR) 25 MG tablet Take 1 tablet (25 mg total) by mouth daily.  [name] NITROSTAT 0.4 MG SL tablet PLACE 1 TABLET UNDER TONGUE EVERY 5 MINUTES AS NEEDED FOR CHEST PAIN (UP TO 3 DOSES)  . omeprazole (PRILOSEC) 20 MG capsule Take 20 mg by mouth daily.  . potassium chloride (K-DUR) 10 MEQ tablet Take 10 mEq by mouth 2 (two) times daily.  . potassium chloride SA (KLOR-CON M20) 20 MEQ tablet Take 1 tablet (20 mEq total) by mouth daily.  . simvastatin (ZOCOR) 10 MG tablet Take 1 tablet (10 mg total) by mouth at bedtime.  . sotalol (BETAPACE) 120 MG tablet Take 120 mg by mouth 2 (two) times daily.  [name] ULORIC 40 MG tablet Take 1 tablet by mouth daily.  [name] warfarin (COUMADIN) 5 MG tablet Take 1 tablet (5 mg total) by mouth daily.   No facility-administered encounter medications on file as of 07/30/2016.     Allergies as of 07/30/2016 - Review Complete 07/30/2016  Allergen Reaction Noted  . Lisinopril Anaphylaxis and Swelling 04/26/2012  . Naproxen Hives     Past Medical History:  Diagnosis Date  . Atrial fibrillation ([place])   . CAD (coronary artery disease)    a. ant MI 1998 with PCI to LAD. b. Ant-lat MI 04/2012 tx with overlapping DES to  LAD.  . CHF (congestive heart failure) ([place])   . CRI (chronic renal insufficiency)   . CVA (cerebral infarction) 11/2010  . DVT (deep venous thrombosis) ([place])   . Esophageal reflux   . Gout   . Heart murmur   . Hypercholesteremia   . ICD (implantable cardiac defibrillator) in place   . Impotence of organic origin   . Ischemic cardiomyopathy    EF 25-30% by echo 04/2012; [place]  . Lung disease, interstitial ([place])   . Myocardial infarction 1998 and 2005  . Peripheral arterial disease ([place])   . 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 Stroke [facility]) ? 2006  . Systolic CHF (HCC)    NYHA Class II/III  . Tobacco use disorder   . Unspecified essential hypertension     Past  Surgical History:  Procedure Laterality Date  . ANTERIOR CERVICAL DECOMP/DISCECTOMY FUSION  2007   "put in 4 screws to hold my head on; ruptured C5; job related injury" (08/24/2012)  . CARDIAC DEFIBRILLATOR PLACEMENT  2011   [employer]  he is a MADIT RIT study patient  . CORONARY ANGIOPLASTY WITH STENT PLACEMENT  05/1997; 04/2012   "1 + 2; total of 3" (08/24/2012)  . EMBOLECTOMY Right 06/24/2014   Procedure: RIGHT FEMORAL EMBOLECTOMY,  ;  Surgeon: Angelia Mould, MD;  Location: [facility] OR;  Service: Vascular;  Laterality: Right;  Right Femoral Embolectomy, with bovine patch angioplasty,.  . FASCIOTOMY Left 06/24/2014   Procedure: FASCIOTOMY;  Surgeon: Angelia Mould, MD;  Location: [place];  Service: Vascular;  Laterality: Left;  four compartment Fasciotomy.  [name] LEFT HEART CATHETERIZATION WITH CORONARY ANGIOGRAM N/A 04/22/2012   Procedure: LEFT HEART CATHETERIZATION WITH CORONARY ANGIOGRAM;  Surgeon: Leonie Man, MD;  Location: [facility] CATH LAB;  Service: Cardiovascular;  Laterality: N/A;  . PERCUTANEOUS CORONARY STENT INTERVENTION (PCI-S)  04/22/2012   Procedure: PERCUTANEOUS CORONARY STENT INTERVENTION (PCI-S);  Surgeon: Leonie Man, MD;  Location: [facility] CATH LAB;  Service: Cardiovascular;;    Family History  Problem Relation Age of Onset  . Diabetes Mother   . Cancer Mother     Ovarian  . Heart disease Mother   . Hyperlipidemia Mother   . Hypertension Mother   . Heart attack Mother   . Heart disease Father   . Hyperlipidemia Father   . Hypertension Father   . Heart attack Father   . Heart disease Brother   . Heart attack Brother   . Cancer Sister     Ovarian-Liver-Lung-Brain  . Heart disease Sister   . Heart attack Daughter   . Heart disease Daughter     Before age 76  . Allergies Daughter   . Cancer Sister     Lung  . Heart disease Sister   . Emphysema Paternal Grandfather     Social History   Social History  . Marital status: Married    Spouse name: N/A    . Number of children: N/A  . Years of education: N/A   Occupational History  . RETIRED    Social History Main Topics  . Smoking status: Former Smoker    Packs/day: 0.75    Years: 40.00    Types: Cigarettes    Start date: 12/20/1983    Quit date: 09/15/2012  . Smokeless tobacco: Current User    Types: Snuff  . Alcohol use No     Comment: 08/24/2012 "used to drink; not anymore; stopped > 25yr ago; never had any  problems w/it"  . Drug use:     Types: Marijuana     Comment: quit 2012  . Sexual activity: Yes   Other Topics Concern  . Not on file   Social History Narrative   Married, lives with spouse   1 daughter    Last updated: 03/15/2010   Retired at age 65 and lives in [place].  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    Tobacco Use - 1PPD x 40+ years,  has quit x 3 days   ETOH- rare   Drugs- occasional marijuana    1 of 17 kids   [place]      Review of systems: Review of Systems  Constitutional: Negative for fever and chills.  HENT: Negative.   Eyes: Negative for blurred vision.  Respiratory: as per HPI  Cardiovascular: Negative for chest pain and palpitations.  Gastrointestinal: Negative for vomiting, diarrhea, blood per rectum. Genitourinary: Negative for dysuria, urgency, frequency and hematuria.  Musculoskeletal: Negative for myalgias, back pain and joint pain.  Skin: Negative for itching and rash.  Neurological: Negative for dizziness, tremors, focal weakness, seizures and loss of consciousness.  Endo/Heme/Allergies: Negative for environmental allergies.  Psychiatric/Behavioral: Negative for depression, suicidal ideas and hallucinations.  All other systems reviewed and are negative.  Physical Exam: Blood pressure 116/82, pulse 76, height 5' 9.5" (1.765 m), weight 194 lb 12.8 oz (88.4 kg), SpO2 96 %. Gen:      No acute distress HEENT:  EOMI, sclera anicteric Neck:     No masses; no thyromegaly Lungs:    Bibasilar  crackles; normal respiratory effort CV:         Regular rate and rhythm; no murmurs Abd:      + bowel sounds; soft, non-tender; no palpable masses, no distension Ext:    No edema; adequate peripheral perfusion Skin:      Warm and dry; no rash Neuro: alert and oriented x 3 Psych: normal mood and affect  Data Reviewed: High-resolution CT 05/04/14- peripheral, basilar reticulation, traction bronchiectasis, groundglass. No definite honeycombing. Chest x-ray 06/11/16-fibrotic changes, bibasilar opacities. Chest x-ray 07/18/16- chronic fibrotic changes. Stable. All images reviewed.  PFTs 05/31/14 FVC 2.75 (70%] FEV1 2.31 [77%) F/F 84 No airway obstruction, no significant bronchodilator response Moderate restriction with severely reduced DLCO.  Serologies 06/05/14 ANA, pANCA- Neg cANCA- postive 1:80 CCP < 2 dsDNA 1 MPO <1 Scl70 < 1 CK- 288  Assessment:  Evaluation for dyspnea. Mr. 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 There is no definite honeycombing and there is some groundglass opacities in the left lung. Serologies in the past were negative except for mild elevation in ANCA. There was also an elevation in CK, thought to be secondary to statin use. COPD may be contributing to his symptoms but I don't believe this is a predominant driver of his dyspnea as he does not have obstruction on prior PFTs.  As he has progression of his dyspnea we will reevaluate with a repeat high-resolution CT to see if the fibrosis has evolved into a more definite UIP pattern. He'll also get repeat pulmonary function tests and autoimmune serologies. If needed he is more open to a lung biopsy this time around. Return to clinic in 1 month to review results and plan for further steps.   Plan/Recommendations: - High resolution CT - ILD auotimmune serologies - PFTs  Marshell Garfinkel MD [facility] Pager [phone] 07/30/2016,  11:35 AM  CC: Glenda Chroman, MD

## 2016-07-30 NOTE — Patient Instructions (Addendum)
We will schedule you for high res CT of the chest, PFTs, ILD panel.  Return to clinic in 1 month to discuss results.

## 2016-07-31 LAB — C-ANCA TITER

## 2016-07-31 LAB — RNP ANTIBODY: RIBONUCLEIC PROTEIN(ENA) ANTIBODY, IGG: NEGATIVE

## 2016-07-31 LAB — ANTI-DNA ANTIBODY, DOUBLE-STRANDED: ds DNA Ab: <1 IU/mL

## 2016-07-31 LAB — SJOGREN'S SYNDROME ANTIBODS(SSA + SSB)
SSA (RO) (ENA) ANTIBODY, IGG: NEGATIVE
SSB (La) (ENA) Antibody, IgG: <1

## 2016-07-31 LAB — RHEUMATOID FACTOR

## 2016-07-31 LAB — CENTROMERE ANTIBODIES: Centromere Ab Screen: <1

## 2016-07-31 LAB — ANTI-SMITH ANTIBODY: ENA SM Ab Ser-aCnc: <1

## 2016-07-31 LAB — ANTI-SCLERODERMA ANTIBODY: SCLERODERMA (SCL-70) (ENA) ANTIBODY, IGG: NEGATIVE

## 2016-07-31 LAB — JO-1 ANTIBODY-IGG: JO-1 ANTIBODY, IGG: NEGATIVE

## 2016-07-31 LAB — ANCA SCREEN W REFLEX TITER: ANCA Screen: POSITIVE — AB

## 2016-07-31 LAB — CYCLIC CITRUL PEPTIDE ANTIBODY, IGG

## 2016-08-01 LAB — ALDOLASE: Aldolase: 6.1 U/L (ref <=8.1)

## 2016-08-04 ENCOUNTER — Ambulatory Visit (HOSPITAL_COMMUNITY)
Admission: RE | Admit: 2016-08-04 | Discharge: 2016-08-04 | Disposition: A | Discharge status: Home / Self Care | Payer: Medicare Other | Source: Ambulatory Visit | Attending: Pulmonary Disease | Admitting: Pulmonary Disease

## 2016-08-04 ENCOUNTER — Telehealth: Payer: Self-pay | Admitting: *Deleted

## 2016-08-04 ENCOUNTER — Telehealth: Payer: Self-pay | Admitting: Pulmonary Disease

## 2016-08-04 DIAGNOSIS — J849 Interstitial pulmonary disease, unspecified: Secondary | ICD-10-CM | POA: Diagnosis present

## 2016-08-04 DIAGNOSIS — R911 Solitary pulmonary nodule: Secondary | ICD-10-CM

## 2016-08-04 DIAGNOSIS — K8020 Calculus of gallbladder without cholecystitis without obstruction: Secondary | ICD-10-CM | POA: Diagnosis not present

## 2016-08-04 DIAGNOSIS — I700 Atherosclerosis of aorta: Secondary | ICD-10-CM | POA: Diagnosis not present

## 2016-08-04 DIAGNOSIS — I2510 Atherosclerotic heart disease of native coronary artery without angina pectoris: Secondary | ICD-10-CM | POA: Insufficient documentation

## 2016-08-04 DIAGNOSIS — J8410 Pulmonary fibrosis, unspecified: Secondary | ICD-10-CM | POA: Diagnosis not present

## 2016-08-04 LAB — CUP PACEART REMOTE DEVICE CHECK
Brady Statistic RV Percent Paced: 0 %
Date Time Interrogation Session: 20171106075000
HIGH POWER IMPEDANCE MEASURED VALUE: 65 Ohm
Implantable Lead Implant Date: 20110328
Implantable Lead Location: 753859
Implantable Lead Serial Number: 28708916
Lead Channel Impedance Value: 492 Ohm
Lead Channel Pacing Threshold Amplitude: 1 V
Lead Channel Pacing Threshold Amplitude: 1 V
Lead Channel Pacing Threshold Pulse Width: 0.4 ms
Lead Channel Setting Pacing Amplitude: 2 V
MDC IDC LEAD IMPLANT DT: 20110328
MDC IDC LEAD LOCATION: 753860
MDC IDC LEAD MODEL: 158
MDC IDC LEAD MODEL: 4136
MDC IDC LEAD SERIAL: 301516
MDC IDC MSMT BATTERY REMAINING LONGEVITY: 60 mo
MDC IDC MSMT BATTERY REMAINING PERCENTAGE: 70 %
MDC IDC MSMT LEADCHNL RA IMPEDANCE VALUE: 575 Ohm
MDC IDC MSMT LEADCHNL RV PACING THRESHOLD PULSEWIDTH: 0.4 ms
MDC IDC PG IMPLANT DT: 20110328
MDC IDC SET LEADCHNL RV PACING AMPLITUDE: 2.4 V
MDC IDC SET LEADCHNL RV PACING PULSEWIDTH: 0.4 ms
MDC IDC SET LEADCHNL RV SENSING SENSITIVITY: 0.6 mV
MDC IDC STAT BRADY RA PERCENT PACED: 0 %
Pulse Gen Serial Number: 147011

## 2016-08-04 NOTE — Telephone Encounter (Signed)
Pt c/o chest pain starting last night with SOB - scheduled for CT this afternoon - pt will report to [facility] ED for further evaluation

## 2016-08-04 NOTE — Telephone Encounter (Signed)
[facility] Radiology called with call report on HR CT from today.  Will forward this message to PM to make him aware.    Study Result   CLINICAL DATA:  Interstitial lung disease, shortness of breath, recent pneumonia.  EXAM: CT CHEST WITHOUT CONTRAST  TECHNIQUE: Multidetector CT imaging of the chest was performed following the standard protocol without intravenous contrast. High resolution imaging of the lungs, as well as inspiratory and expiratory imaging, was performed.  COMPARISON:  05/04/2014.  FINDINGS: Cardiovascular: Atherosclerotic calcification of the arterial vasculature, including coronary arteries. Pulmonary arteries are borderline enlarged. Heart is enlarged. No pericardial effusion.  Mediastinum/Nodes: Mediastinal lymph nodes measure up to 11 mm in the AP window, as before. Subcarinal lymph node, 1.6 cm, stable. Lymph nodes along the esophagus measure up to 1.5 cm, stable. Hilar regions are difficult to definitively evaluate without IV contrast. No axillary adenopathy. Esophagus is grossly unremarkable.  Lungs/Pleura: Image quality is somewhat degraded by respiratory motion. Basilar predominant subpleural reticulation, parenchymal ground-glass, traction bronchiectasis/ bronchiolectasis and mild architectural distortion, possibly minimally progressive from 05/04/2014. A new lobulated nodule in the left lower lobe measures 1.9 x 2.0 cm (series 4, image 104). 4 mm subpleural lymph node along the minor fissure, as before. No pleural fluid. Airway is unremarkable.  Upper Abdomen: Visualized portion of the liver is unremarkable. Stones are seen in the gallbladder. Visualized portions of the adrenal glands are unremarkable. Low-attenuation lesions in the kidneys measure up to 3.1 cm on the right, incompletely imaged. Visualized portions of the spleen, pancreas, stomach and bowel are grossly unremarkable. No upper abdominal adenopathy.  Musculoskeletal: No  worrisome lytic or sclerotic lesions. Degenerative changes are seen in the spine.  IMPRESSION: 1. Pulmonary parenchymal pattern of fibrosis may be minimally progressive from 05/04/2014, indicative of usual interstitial pneumonitis. 2. New lobulated left lower lobe nodule, worrisome for primary bronchogenic carcinoma. These results will be called to the ordering clinician or representative by the Radiologist Assistant, and communication documented in the PACS or zVision Dashboard. 3. Aortic atherosclerosis (ICD10-170.0). Coronary artery calcification. 4. Borderline enlarged pulmonary arteries. 5. Cholelithiasis.   Electronically Signed   By: Lorin Picket M.D.   On: 08/04/2016 15:45

## 2016-08-11 LAB — MYOSITIS PANEL III
EJ: NEGATIVE
JO-1 (WB): NEGATIVE
KU: NEGATIVE
Mi-2 antibodies*: NEGATIVE
OJ: NEGATIVE
PL-12*: NEGATIVE
PL-7*: NEGATIVE
PM-SCL 100: NEGATIVE
PM-SCL 75: NEGATIVE
RNP: 14.2 EU/ml
RO-52*: NEGATIVE
SIGNAL RECOGNITION PARTICLE: NEGATIVE

## 2016-08-11 LAB — ANA COMPREHENSIVE PANEL
Centromere Ab Screen: <0.2 AI (ref 0.0–0.9)
DSDNA AB: 1 [IU]/mL (ref 0–9)
ENA RNP Ab: 0.6 AI (ref 0.0–0.9)
ENA SSA (RO) Ab: <0.2 AI (ref 0.0–0.9)
ENA SSB (LA) Ab: <0.2 AI (ref 0.0–0.9)

## 2016-08-11 LAB — ANA W/REFLEX: ANA: NEGATIVE

## 2016-08-11 NOTE — Telephone Encounter (Signed)
I have called the numbers on record several times but it is either not in service or goes to voice mail. Can you try to reach him as I would like to discuss the results. He will need a biopsy. Also order a PET scan. Thanks

## 2016-08-19 NOTE — Telephone Encounter (Signed)
Have also tried to contact pt several times with no result Did call and speak with patient today He will be available to speak with PM PET has not yet been scheduled - office protocol is that tests are not ordered/scheduled until pt is aware of results Routing back PM to discuss results with patient

## 2016-08-20 NOTE — Telephone Encounter (Signed)
I called and spoke with Adam House about the results of the CT scan. We will need to evaluate the LLL nodule which is suspicious for a malignancy.  Please order PET scan. Hopefully it can be scheduled before his clinic visit on Dec 14th. I will review the PFTs and scan with him before deciding on the best approach for biopsy.

## 2016-08-20 NOTE — Telephone Encounter (Signed)
PET scan ordered with note asking if scan can be scheduled prior to his 12.14.17 visit with PM Nothing further needed; will sign off

## 2016-08-27 ENCOUNTER — Encounter (HOSPITAL_COMMUNITY)
Admission: RE | Admit: 2016-08-27 | Discharge: 2016-08-27 | Disposition: A | Discharge status: Home / Self Care | Payer: Medicare Other | Source: Ambulatory Visit | Attending: Pulmonary Disease | Admitting: Pulmonary Disease

## 2016-08-27 DIAGNOSIS — R911 Solitary pulmonary nodule: Secondary | ICD-10-CM | POA: Diagnosis not present

## 2016-08-27 DIAGNOSIS — M1A00X0 Idiopathic chronic gout, unspecified site, without tophus (tophi): Secondary | ICD-10-CM | POA: Insufficient documentation

## 2016-08-27 LAB — GLUCOSE, CAPILLARY: Glucose-Capillary: 97 mg/dL (ref 65–99)

## 2016-08-27 MED ORDER — FLUDEOXYGLUCOSE F - 18 (FDG) INJECTION
9.5000 | Freq: Once | INTRAVENOUS | Status: AC | PRN
  Administered 2016-08-27: 9.5 via INTRAVENOUS

## 2016-08-27 NOTE — Progress Notes (Deleted)
Office Visit Note  Patient: Adam House             Date of Birth: 12-24-1950           MRN: [id number]             PCP: Glenda Chroman, MD Referring: Glenda Chroman, MD Visit Date: 09/01/2016 Occupation: Furniture conservator/restorer, retired    Subjective:  No chief complaint on file.   History of Present Illness: Adam House is a 65 y.o. male ***   Activities of Daily Living:  Patient reports morning stiffness for *** {minute/hour:19697}.   Patient {ACTIONS;DENIES/REPORTS:21021675::"Denies"} nocturnal pain.  Difficulty dressing/grooming: {ACTIONS;DENIES/REPORTS:21021675::"Denies"} Difficulty climbing stairs: {ACTIONS;DENIES/REPORTS:21021675::"Denies"} Difficulty getting out of chair: {ACTIONS;DENIES/REPORTS:21021675::"Denies"} Difficulty using hands for taps, buttons, cutlery, and/or writing: {ACTIONS;DENIES/REPORTS:21021675::"Denies"}   No Rheumatology ROS completed.   PMFS History:  Patient Active Problem List   Diagnosis Date Noted  . Idiopathic chronic gout, unspecified site, without tophus (tophi) 08/27/2016  . Atrial fibrillation ([place]) 01/18/2016  . VT (ventricular tachycardia) ([place]) 07/07/2014  . Acute venous embolism and thrombosis of deep vessels of proximal lower extremity ([place]) 07/04/2014  . Ischemia of lower extremity 06/24/2014  . Lung disease, interstitial ([place])   . ILD (interstitial lung disease) ([place]) 06/05/2014  . Dyspnea and respiratory abnormality 05/01/2014  . Occlusion and stenosis of carotid artery without mention of cerebral infarction 12/06/2013  . 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 Carotid artery disease ([place]) 10/28/2013  . Ventricular tachyarrhythmia ([place]) 07/07/2013  . Tobacco abuse 09/03/2012  . Carotid bruit 09/03/2012  . Chronic systolic CHF (congestive heart failure) ([place]) 08/25/2012  . NSTEMI (non-ST elevated myocardial infarction) ([place]) 08/25/2012  . Hyperlipidemia 05/13/2012  . Lip  swelling 04/26/2012  . STEMI (ST elevation myocardial infarction) ([place]) 04/24/2012  . Acute on chronic systolic CHF (congestive heart failure) ([place]) 04/24/2012  . Encounter for servicing of automatic implantable cardioverter-defibrillator (AICD) at end of battery life 04/24/2012  . Chronic kidney disease (CKD), stage II (mild) 04/24/2012  . Acute renal failure ([place]) 04/24/2012  . Pericarditis 04/24/2012  . Other primary cardiomyopathies 03/15/2012  . Hypertension 02/17/2011  . NICOTINE ADDICTION 03/15/2010  . IMPLANTATION OF DEFIBRILLATOR, HX OF 01/24/2010  . CARDIOMYOPATHY, ISCHEMIC 12/03/2009  . ESSENTIAL HYPERTENSION, BENIGN 10/01/2009  . CAD 10/01/2009  . LEFT VENTRICULAR FUNCTION, DECREASED 10/01/2009  . AAA 10/01/2009  . AMI 09/28/2009  . RENAL FAILURE 09/28/2009  . 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 Chronic systolic dysfunction of left ventricle 09/28/2009  . Other and unspecified hyperlipidemia 07/12/2009    Past Medical History:  Diagnosis Date  . Atrial fibrillation ([place])   . CAD (coronary artery disease)    a. ant MI 1998 with PCI to LAD. b. Ant-lat MI 04/2012 tx with overlapping DES to LAD.  [name] CHF (congestive heart failure) ([place])   . CRI (chronic renal insufficiency)   . CVA (cerebral infarction) 11/2010  . DVT (deep venous thrombosis) ([place])   . Esophageal reflux   . Gout   . Heart murmur   . Hypercholesteremia   . ICD (implantable cardiac defibrillator) in place   . Impotence of organic origin   . Ischemic cardiomyopathy    EF 25-30% by echo 04/2012; [place]  . Lung disease, interstitial ([place])   . Myocardial infarction 1998 and 2005  . Peripheral arterial disease ([place])   . 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 Stroke [facility]) ? 2006  . Systolic CHF ([place])  NYHA Class II/III  . Tobacco use disorder   . Unspecified essential hypertension     Family History  Problem Relation Age of Onset  . Diabetes Mother   . Cancer Mother      Ovarian  . Heart disease Mother   . Hyperlipidemia Mother   . Hypertension Mother   . Heart attack Mother   . Heart disease Father   . Hyperlipidemia Father   . Hypertension Father   . Heart attack Father   . Heart disease Brother   . Heart attack Brother   . Cancer Sister     Ovarian-Liver-Lung-Brain  . Heart disease Sister   . Heart attack Daughter   . Heart disease Daughter     Before age 19  . Allergies Daughter   . Cancer Sister     Lung  . Heart disease Sister   . Emphysema Paternal Grandfather    Past Surgical History:  Procedure Laterality Date  . ANTERIOR CERVICAL DECOMP/DISCECTOMY FUSION  2007   "put in 4 screws to hold my head on; ruptured C5; job related injury" (08/24/2012)  . CARDIAC DEFIBRILLATOR PLACEMENT  2011   [employer]  he is a MADIT RIT study patient  . CORONARY ANGIOPLASTY WITH STENT PLACEMENT  05/1997; 04/2012   "1 + 2; total of 3" (08/24/2012)  . EMBOLECTOMY Right 06/24/2014   Procedure: RIGHT FEMORAL EMBOLECTOMY,  ;  Surgeon: Angelia Mould, MD;  Location: [facility] OR;  Service: Vascular;  Laterality: Right;  Right Femoral Embolectomy, with bovine patch angioplasty,.  . FASCIOTOMY Left 06/24/2014   Procedure: FASCIOTOMY;  Surgeon: Angelia Mould, MD;  Location: [place];  Service: Vascular;  Laterality: Left;  four compartment Fasciotomy.  [name] LEFT HEART CATHETERIZATION WITH CORONARY ANGIOGRAM N/A 04/22/2012   Procedure: LEFT HEART CATHETERIZATION WITH CORONARY ANGIOGRAM;  Surgeon: Leonie Man, MD;  [facility] CATH LAB;  Service: Cardiovascular;  Laterality: N/A;  . PERCUTANEOUS CORONARY STENT INTERVENTION (PCI-S)  04/22/2012   Procedure: PERCUTANEOUS CORONARY STENT INTERVENTION (PCI-S);  Surgeon: Leonie Man, MD;  Location: [facility] CATH LAB;  Service: Cardiovascular;;   Social History   Social History Narrative   Married, lives with spouse   1 daughter    Last updated: 03/15/2010   Retired at age 51 and lives in [place].  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    Tobacco Use - 1PPD x 40+ years,  has quit x 3 days   ETOH- rare   Drugs- occasional marijuana    1 of 17 kids   [place]        Objective: Vital Signs: There were no vitals taken for this visit.   Physical Exam   Musculoskeletal Exam: ***  CDAI Exam: No CDAI exam completed.    Investigation: Findings:  05/16/2016 CBC normal CMP shows Creat 1.48 GFR 57 , 05/05/2016 uric acid 2.3    Imaging: Ct Chest High Resolution  Result Date: 08/04/2016 CLINICAL DATA:  Interstitial lung disease, shortness of breath, recent pneumonia. EXAM: CT CHEST WITHOUT CONTRAST TECHNIQUE: Multidetector CT imaging of the chest was performed following the standard protocol without intravenous contrast. High resolution imaging of the lungs, as well as inspiratory and expiratory imaging, was performed. COMPARISON:  05/04/2014. FINDINGS: Cardiovascular: Atherosclerotic calcification of the arterial vasculature, including coronary arteries. Pulmonary arteries are borderline enlarged. Heart is enlarged. No pericardial effusion. Mediastinum/Nodes: Mediastinal lymph nodes measure up to 11 mm in the AP window, as before.  Subcarinal lymph node, 1.6 cm, stable. Lymph nodes along the esophagus measure up to 1.5 cm, stable. Hilar regions are difficult to definitively evaluate without IV contrast. No axillary adenopathy. Esophagus is grossly unremarkable. Lungs/Pleura: Image quality is somewhat degraded by respiratory motion. Basilar predominant subpleural reticulation, parenchymal ground-glass, traction bronchiectasis/ bronchiolectasis and mild architectural distortion, possibly minimally progressive from 05/04/2014. A new lobulated nodule in the left lower lobe measures 1.9 x 2.0 cm (series 4, image 104). 4 mm subpleural lymph node along the minor fissure, as before. No pleural fluid. Airway is unremarkable. Upper Abdomen: Visualized portion of the  liver is unremarkable. Stones are seen in the gallbladder. Visualized portions of the adrenal glands are unremarkable. Low-attenuation lesions in the kidneys measure up to 3.1 cm on the right, incompletely imaged. Visualized portions of the spleen, pancreas, stomach and bowel are grossly unremarkable. No upper abdominal adenopathy. Musculoskeletal: No worrisome lytic or sclerotic lesions. Degenerative changes are seen in the spine. IMPRESSION: 1. Pulmonary parenchymal pattern of fibrosis may be minimally progressive from 05/04/2014, indicative of usual interstitial pneumonitis. 2. New lobulated left lower lobe nodule, worrisome for primary bronchogenic carcinoma. These results will be called to the ordering clinician or representative by the Radiologist Assistant, and communication documented in the PACS or zVision Dashboard. 3. Aortic atherosclerosis (ICD10-170.0). Coronary artery calcification. 4. Borderline enlarged pulmonary arteries. 5. Cholelithiasis. Electronically Signed   By: Lorin Picket M.D.   On: 08/04/2016 15:45   Nm Pet Image Initial (pi) Skull Base To Thigh  Result Date: 08/27/2016 CLINICAL DATA:  Initial treatment strategy for left lower lobe lung nodule. EXAM: NUCLEAR MEDICINE PET SKULL BASE TO THIGH TECHNIQUE: 9.5 mCi F-18 FDG was injected intravenously. Full-ring PET imaging was performed from the skull base to thigh after the radiotracer. CT data was obtained and used for attenuation correction and anatomic localization. FASTING BLOOD GLUCOSE:  Value: 95 mg/dl COMPARISON:  Chest CT 08/04/2016 FINDINGS: NECK No hypermetabolic lymph nodes in the neck. Symmetric glottic activity is likely physiologic. CHEST 2.8 by 2.7 cm left lower lobe pulmonary nodule, maximum SUV 22.7. There is hypermetabolic left infrahilar, subcarinal, hilar, and paraesophageal adenopathy, including a posterior left hilar lymph node measuring 2.9 cm in short axis on image 73 of series 4 with maximum SUV 33.5; and a left  paraesophageal node measuring 0.9 cm in short axis with maximum SUV 17.8. There is also some faintly hypermetabolic AP window adenopathy. Low-grade and likely inflammatory activity corresponding to the abnormal ground-glass opacities and interstitial accentuation in the lung bases. Coronary, aortic arch, and branch vessel atherosclerotic vascular disease. ABDOMEN/PELVIS Photopenic cysts in the kidneys. A complex left kidney lower pole anterior exophytic lesion essentially isodense to the rest of the kidney with density 27 Hounsfield units appears to be photopenic and is likely a complex cyst. Dependent density in the gallbladder compatible with small gallstones. Aortoiliac atherosclerotic vascular disease. Infrarenal abdominal aortic ectasia at 2.9 cm. Sigmoid diverticulosis. SKELETON No focal hypermetabolic activity to suggest skeletal metastasis. Small focus activity along the left L3-4 intervertebral level with maximum SUV 6.0, likely due to spurring. IMPRESSION: 1. 2.8 cm left lower lobe pulmonary nodule, SUV 22.7. Hypermetabolic left infrahilar, subcarinal, hilar, and paraesophageal adenopathy. Assuming non-small cell lung cancer, this represents T1b N2 M0 disease (stage IIIA). 2. Low-grade inflammatory activity associated with the ground-glass opacities and interstitial accentuation in the lung bases. 3. Coronary, aortic arch, and branch vessel atherosclerotic vascular disease. Aortoiliac atherosclerotic vascular disease. Infrarenal abdominal aortic ectasia. 4. Renal cysts including 1 complex cyst  which appears photopenic/benign. 5. Sigmoid diverticulosis. 6. Small focus of activity on the left at L3-4 is likely degenerative. Electronically Signed   By: Van Clines M.D.   On: 08/27/2016 12:06    Speciality Comments: No specialty comments available.    Procedures:  No procedures performed Allergies: Lisinopril and Naproxen   Assessment / Plan:     Visit Diagnoses: DJD (degenerative joint  disease), cervical  Idiopathic chronic gout of multiple sites without tophus  Primary osteoarthritis of both knees  Primary osteoarthritis of both feet  Primary osteoarthritis of both hands  Chondromalacia patellae, left knee    Orders: No orders of the defined types were placed in this encounter.  No orders of the defined types were placed in this encounter.   Face-to-face time spent with patient was *** minutes. 50% of time was spent in counseling and coordination of care.  Follow-Up Instructions: No Follow-up on file.   Amy Littrell, RT

## 2016-08-28 ENCOUNTER — Ambulatory Visit (INDEPENDENT_AMBULATORY_CARE_PROVIDER_SITE_OTHER): Payer: Medicare Other | Admitting: Pulmonary Disease

## 2016-08-28 ENCOUNTER — Encounter: Payer: Self-pay | Admitting: Pulmonary Disease

## 2016-08-28 ENCOUNTER — Encounter (HOSPITAL_COMMUNITY): Payer: Self-pay

## 2016-08-28 ENCOUNTER — Other Ambulatory Visit (HOSPITAL_COMMUNITY): Payer: Self-pay | Admitting: Respiratory Therapy

## 2016-08-28 ENCOUNTER — Ambulatory Visit (HOSPITAL_COMMUNITY)
Admission: RE | Admit: 2016-08-28 | Discharge: 2016-08-28 | Disposition: A | Discharge status: Home / Self Care | Payer: Medicare Other | Source: Ambulatory Visit | Attending: Pulmonary Disease | Admitting: Pulmonary Disease

## 2016-08-28 ENCOUNTER — Ambulatory Visit (HOSPITAL_COMMUNITY): Admission: RE | Admit: 2016-08-28 | Payer: Medicare Other | Source: Ambulatory Visit

## 2016-08-28 VITALS — BP 124/78 | HR 77 | Ht 69.5 in | Wt 190.0 lb

## 2016-08-28 DIAGNOSIS — J849 Interstitial pulmonary disease, unspecified: Secondary | ICD-10-CM

## 2016-08-28 DIAGNOSIS — R911 Solitary pulmonary nodule: Secondary | ICD-10-CM | POA: Diagnosis not present

## 2016-08-28 DIAGNOSIS — Z87891 Personal history of nicotine dependence: Secondary | ICD-10-CM | POA: Diagnosis not present

## 2016-08-28 DIAGNOSIS — R942 Abnormal results of pulmonary function studies: Secondary | ICD-10-CM | POA: Insufficient documentation

## 2016-08-28 LAB — PULMONARY FUNCTION TEST
DL/VA % PRED: 43 %
DL/VA: 1.99 ml/min/mmHg/L
DLCO UNC: 7.84 ml/min/mmHg
DLCO unc % pred: 24 %
FEF 25-75 Post: 2.01 L/sec
FEF 25-75 Pre: 1.89 L/sec
FEF2575-%Change-Post: 6 %
FEF2575-%PRED-POST: 76 %
FEF2575-%Pred-Pre: 72 %
FEV1-%CHANGE-POST: 0 %
FEV1-%PRED-PRE: 69 %
FEV1-%Pred-Post: 69 %
FEV1-POST: 2.05 L
FEV1-PRE: 2.05 L
FEV1FVC-%CHANGE-POST: -3 %
FEV1FVC-%Pred-Pre: 102 %
FEV6-%Change-Post: 0 %
FEV6-%PRED-PRE: 70 %
FEV6-%Pred-Post: 70 %
FEV6-POST: 2.58 L
FEV6-Pre: 2.59 L
FEV6FVC-%PRED-POST: 104 %
FEV6FVC-%Pred-Pre: 104 %
FVC-%Change-Post: 3 %
FVC-%PRED-PRE: 67 %
FVC-%Pred-Post: 69 %
FVC-POST: 2.69 L
FVC-PRE: 2.59 L
POST FEV6/FVC RATIO: 100 %
PRE FEV6/FVC RATIO: 100 %
Post FEV1/FVC ratio: 76 %
Pre FEV1/FVC ratio: 79 %
RV % PRED: 50 %
RV: 1.18 L
TLC % PRED: 57 %
TLC: 3.94 L

## 2016-08-28 MED ORDER — ALBUTEROL SULFATE (2.5 MG/3ML) 0.083% IN NEBU
2.5000 mg | INHALATION_SOLUTION | Freq: Once | RESPIRATORY_TRACT | Status: AC
  Administered 2016-08-28: 2.5 mg via RESPIRATORY_TRACT

## 2016-08-28 NOTE — Patient Instructions (Signed)
We'll get in touch with your cardiologist for clearance for EBUS was under general anesthesia.  We get back in touch with you once we have a date for the procedure.  Return to clinic in 1-2 months.

## 2016-08-28 NOTE — Progress Notes (Signed)
From my standpoint, he can proceed with procedure and warfarin can be held.

## 2016-08-28 NOTE — Progress Notes (Signed)
Patient ID: Adam House, male   DOB: 04/12/1951, 65 y.o.   MRN: [id number]               Adam House    [id number]    [birth year]  Primary Care Physician:Adam Barbette Reichmann, MD  Referring Physician: Glenda Chroman, MD [place], [place] [address]  Chief complaint:   Follow up for Interstitial lung disease COPD Lung nodule  HPI: [name] is a 65 year old with past medical history of COPD, lung fibrosis. He was evaluated by Dr. Chase House in 2015 with a high res CT scan that showed probable UIP fibrosis. There is no clear evidence of honeycombing and there was groundglass opacities in the left upper lobe that suggested alternate diagnosis. He had a ILD panel that showed mild elevation in c ANCA. There is also elevation in CK that was thought to be secondary to crestor. This was held with improvement in CKs. VATS lung biopsy was recommended for further evaluation. He was seen by Dr. Blase House but he declined due to the fact that he had issues with DVT at that same time and had to undergo thrombectomy.  He has been referred back for progressive increase in dyspnea on exertion. He had an episode of pneumonia last month that was treated with antibiotics, prednisone taper.    Outpatient Encounter Prescriptions as of 08/28/2016  Medication Sig  . albuterol (PROVENTIL HFA;VENTOLIN HFA) 108 (90 Base) MCG/ACT inhaler Inhale 2 puffs into the lungs every 6 (six) hours as needed for wheezing or shortness of breath.  . allopurinol (ZYLOPRIM) 300 MG tablet Take 300 mg by mouth daily.  [name] ALPRAZolam (XANAX) 0.5 MG tablet Take 0.5 mg by mouth 2 (two) times daily as needed for anxiety.  . colchicine 0.6 MG tablet Take 0.6 mg by mouth daily.  . furosemide (LASIX) 20 MG tablet Take 1 tablet (20 mg total) by mouth daily.  [name] ipratropium (ATROVENT) 0.02 % nebulizer solution Take 0.5 mg by nebulization every 6 (six) hours as needed for wheezing or shortness of breath.  . isosorbide mononitrate (IMDUR) 30  MG 24 hr tablet Take 1 tablet (30 mg total) by mouth daily.  [name] losartan (COZAAR) 25 MG tablet Take 1 tablet (25 mg total) by mouth daily.  [name] NITROSTAT 0.4 MG SL tablet PLACE 1 TABLET UNDER TONGUE EVERY 5 MINUTES AS NEEDED FOR CHEST PAIN (UP TO 3 DOSES)  . omeprazole (PRILOSEC) 20 MG capsule Take 20 mg by mouth daily.  . potassium chloride (K-DUR) 10 MEQ tablet Take 10 mEq by mouth 2 (two) times daily.  . potassium chloride SA (KLOR-CON M20) 20 MEQ tablet Take 1 tablet (20 mEq total) by mouth daily.  . simvastatin (ZOCOR) 10 MG tablet Take 1 tablet (10 mg total) by mouth at bedtime.  . sotalol (BETAPACE) 120 MG tablet Take 120 mg by mouth 2 (two) times daily.  [name] ULORIC 40 MG tablet Take 1 tablet by mouth daily.  [name] warfarin (COUMADIN) 5 MG tablet Take 1 tablet (5 mg total) by mouth daily.   No facility-administered encounter medications on file as of 08/28/2016.     Allergies as of 08/28/2016 - Review Complete 08/28/2016  Allergen Reaction Noted  . Lisinopril Anaphylaxis and Swelling 04/26/2012  . Naproxen Hives     Past Medical History:  Diagnosis Date  . Atrial fibrillation ([place])   . CAD (coronary artery disease)    a. ant MI 1998 with PCI to LAD. b. Ant-lat MI 04/2012 tx with overlapping  DES to LAD.  [name] CHF (congestive heart failure) ([place])   . CRI (chronic renal insufficiency)   . CVA (cerebral infarction) 11/2010  . DVT (deep venous thrombosis) ([place])   . Esophageal reflux   . Gout   . Heart murmur   . Hypercholesteremia   . ICD (implantable cardiac defibrillator) in place   . Impotence of organic origin   . Ischemic cardiomyopathy    EF 25-30% by echo 04/2012; [place]  . Lung disease, interstitial ([place])   . Myocardial infarction 1998 and 2005  . Peripheral arterial disease ([place])   . Post-MI pericarditis ([place])    04/2012  . Stroke [facility]) ? 2006  . Systolic CHF ([facility])    NYHA Class II/III  . Tobacco use disorder   . Unspecified essential hypertension     Past  Surgical History:  Procedure Laterality Date  . ANTERIOR CERVICAL DECOMP/DISCECTOMY FUSION  2007   "put in 4 screws to hold my head on; ruptured C5; job related injury" (08/24/2012)  . CARDIAC DEFIBRILLATOR PLACEMENT  2011   [employer]  he is a MADIT RIT study patient  . CORONARY ANGIOPLASTY WITH STENT PLACEMENT  05/1997; 04/2012   "1 + 2; total of 3" (08/24/2012)  . EMBOLECTOMY Right 06/24/2014   Procedure: RIGHT FEMORAL EMBOLECTOMY,  ;  Surgeon: Angelia Mould, MD;  Location: [facility] OR;  Service: Vascular;  Laterality: Right;  Right Femoral Embolectomy, with bovine patch angioplasty,.  . FASCIOTOMY Left 06/24/2014   Procedure: FASCIOTOMY;  Surgeon: Angelia Mould, MD;  Location: [place];  Service: Vascular;  Laterality: Left;  four compartment Fasciotomy.  [name] LEFT HEART CATHETERIZATION WITH CORONARY ANGIOGRAM N/A 04/22/2012   Procedure: LEFT HEART CATHETERIZATION WITH CORONARY ANGIOGRAM;  Surgeon: Leonie Man, MD;  [facility] CATH LAB;  Service: Cardiovascular;  Laterality: N/A;  . PERCUTANEOUS CORONARY STENT INTERVENTION (PCI-S)  04/22/2012   Procedure: PERCUTANEOUS CORONARY STENT INTERVENTION (PCI-S);  Surgeon: Leonie Man, MD;  Location: [facility] CATH LAB;  Service: Cardiovascular;;    Family History  Problem Relation Age of Onset  . Diabetes Mother   . Cancer Mother     Ovarian  . Heart disease Mother   . Hyperlipidemia Mother   . Hypertension Mother   . Heart attack Mother   . Heart disease Father   . Hyperlipidemia Father   . Hypertension Father   . Heart attack Father   . Heart disease Brother   . Heart attack Brother   . Cancer Sister     Ovarian-Liver-Lung-Brain  . Heart disease Sister   . Heart attack Daughter   . Heart disease Daughter     Before age 2  . Allergies Daughter   . Cancer Sister     Lung  . Heart disease Sister   . Emphysema Paternal Grandfather     Social History   Social History  . Marital status: Married    Spouse name: N/A    . Number of children: N/A  . Years of education: N/A   Occupational History  . RETIRED    Social History Main Topics  . Smoking status: Former Smoker    Packs/day: 0.75    Years: 40.00    Types: Cigarettes    Start date: 12/20/1983    Quit date: 09/15/2012  . Smokeless tobacco: Current User    Types: Snuff  . Alcohol use No     Comment: 08/24/2012 "used to drink; not anymore; stopped > 66yr ago; never  had any problems w/it"  . Drug use:     Types: Marijuana     Comment: quit 2012  . Sexual activity: Yes   Other Topics Concern  . Not on file   Social History Narrative   Married, lives with spouse   1 daughter    Last updated: 03/15/2010   Retired at age 24 and lives in [place].  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    Tobacco Use - 1PPD x 40+ years,  has quit x 3 days   ETOH- rare   Drugs- occasional marijuana    1 of 17 kids   [place]      Review of systems: Review of Systems  Constitutional: Negative for fever and chills.  HENT: Negative.   Eyes: Negative for blurred vision.  Respiratory: as per HPI  Cardiovascular: Negative for chest pain and palpitations.  Gastrointestinal: Negative for vomiting, diarrhea, blood per rectum. Genitourinary: Negative for dysuria, urgency, frequency and hematuria.  Musculoskeletal: Negative for myalgias, back pain and joint pain.  Skin: Negative for itching and rash.  Neurological: Negative for dizziness, tremors, focal weakness, seizures and loss of consciousness.  Endo/Heme/Allergies: Negative for environmental allergies.  Psychiatric/Behavioral: Negative for depression, suicidal ideas and hallucinations.  All other systems reviewed and are negative.  Physical Exam: Blood pressure 116/82, pulse 76, height 5' 9.5" (1.765 m), weight 194 lb 12.8 oz (88.4 kg), SpO2 96 %. Gen:      No acute distress HEENT:  EOMI, sclera anicteric Neck:     No masses; no thyromegaly Lungs:    Bibasilar  crackles; normal respiratory effort CV:         Regular rate and rhythm; no murmurs Abd:      + bowel sounds; soft, non-tender; no palpable masses, no distension Ext:    No edema; adequate peripheral perfusion Skin:      Warm and dry; no rash Neuro: alert and oriented x 3 Psych: normal mood and affect  Data Reviewed: PET Scan 08/27/16- .8 cm left lower lobe pulmonary nodule, SUV 22.7. Hypermetabolic left infrahilar, subcarinal, hilar, and paraesophageal adenopathy. Assuming non-small cell lung cancer, this represents T1b N2 M0 disease (stage IIIA). High resolution CT 08/04/16- stable UIP fibrosis. Left lower lobe pulmonary nodule. High-resolution CT 05/04/14- peripheral, basilar reticulation, traction bronchiectasis, groundglass. No definite honeycombing. Chest x-ray 06/11/16-fibrotic changes, bibasilar opacities. Chest x-ray 07/18/16- chronic fibrotic changes. Stable. All images reviewed.  PFTs 05/31/14 FVC 2.75 (70%] FEV1 2.31 [77%) F/F 84 No airway obstruction, no significant bronchodilator response Moderate restriction with severely reduced DLCO.  PFTs  08/28/16 FVC 2.69 (69%) FEV1 2.05 (69%] F/F 76 TLC 57% DLCO 24% No airway obstruction, no significant bronchodilator response Moderate restriction with severely reduced DLCO.  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 Stacey has lung fibrosis in possible UIP pattern on CT scan. There is no definite honeycombing and there is some groundglass opacities in the left lung. Serologies in the past and on repeat were negative except for mild elevation in ANCA. There was also an elevation in CK, thought to be secondary to statin use. COPD may be contributing to his symptoms but I don't believe this is a predominant driver of his dyspnea as he does not have obstruction on prior PFTs.  Lung nodule with mediastinal LNs  Suspicious for malignancy. He'll need a bronchoscope with EBUS biopsy for further evaluation. I will get in touch with his cardiologist for clearance and to make sure it is OK to stop coumadin for the procedure. Risk of the procedure were discussed with the patient and he is OK to proceed but would like to have it done after the New year  Plan/Recommendations: - Plan for EBUS biopsy under [place] - Get cardiology clearence  Marshell Garfinkel MD  Pulmonary and Critical Care Pager [phone] 08/28/2016, 12:48 PM  CC: Adam Chroman, MD

## 2016-08-29 DIAGNOSIS — M19041 Primary osteoarthritis, right hand: Secondary | ICD-10-CM | POA: Insufficient documentation

## 2016-08-29 DIAGNOSIS — M19072 Primary osteoarthritis, left ankle and foot: Secondary | ICD-10-CM

## 2016-08-29 DIAGNOSIS — M170 Bilateral primary osteoarthritis of knee: Secondary | ICD-10-CM | POA: Insufficient documentation

## 2016-08-29 DIAGNOSIS — M2242 Chondromalacia patellae, left knee: Secondary | ICD-10-CM | POA: Insufficient documentation

## 2016-08-29 DIAGNOSIS — M47812 Spondylosis without myelopathy or radiculopathy, cervical region: Secondary | ICD-10-CM | POA: Insufficient documentation

## 2016-08-29 DIAGNOSIS — M19071 Primary osteoarthritis, right ankle and foot: Secondary | ICD-10-CM | POA: Insufficient documentation

## 2016-08-29 DIAGNOSIS — M19042 Primary osteoarthritis, left hand: Secondary | ICD-10-CM

## 2016-09-01 ENCOUNTER — Ambulatory Visit: Payer: Self-pay | Admitting: Rheumatology

## 2016-09-01 ENCOUNTER — Ambulatory Visit (INDEPENDENT_AMBULATORY_CARE_PROVIDER_SITE_OTHER): Payer: Medicare Other | Admitting: *Deleted

## 2016-09-01 ENCOUNTER — Ambulatory Visit (INDEPENDENT_AMBULATORY_CARE_PROVIDER_SITE_OTHER): Payer: Medicare Other | Admitting: Cardiology

## 2016-09-01 ENCOUNTER — Encounter: Payer: Self-pay | Admitting: Cardiology

## 2016-09-01 ENCOUNTER — Encounter: Payer: Self-pay | Admitting: *Deleted

## 2016-09-01 ENCOUNTER — Telehealth: Payer: Self-pay | Admitting: Pulmonary Disease

## 2016-09-01 VITALS — BP 111/74 | HR 74 | Ht 69.0 in | Wt 193.0 lb

## 2016-09-01 VITALS — BP 111/74 | HR 74

## 2016-09-01 DIAGNOSIS — I2510 Atherosclerotic heart disease of native coronary artery without angina pectoris: Secondary | ICD-10-CM | POA: Diagnosis not present

## 2016-09-01 DIAGNOSIS — R079 Chest pain, unspecified: Secondary | ICD-10-CM

## 2016-09-01 NOTE — Telephone Encounter (Signed)
I will set this up do you have any preference of morning afternoon or any day better for you

## 2016-09-01 NOTE — Telephone Encounter (Signed)
I have called OR'@[facility]'$  AND THEY TELL ME THERE ARE NO OPENINGS THE WHOLE WEEK OF 09/22/16 AND [place] ONLY DOES EBUS ON Monday MORNINGS'@7'$ :30AM

## 2016-09-01 NOTE — Telephone Encounter (Addendum)
I heard from the cardiologist that [name] can go ahead with the EBUS under [place] and the coumadin can be held. [name] would like to have the procedure after the holidays. Please schedule for the week of Jan 8th. Hold coumadin for 7 days and recheck PT/INR on day before procedure.

## 2016-09-01 NOTE — Patient Instructions (Signed)
Medication Instructions:  Continue all current medications.  Labwork: none  Testing/Procedures: Your physician has requested that you have en exercise stress myoview. For further information please visit [email or web address]. Please follow instruction sheet, as given.  Follow-Up: 1 month - Dr. Bronson Ing   Any Other Special Instructions Will Be Listed Below (If Applicable).  If you need a refill on your cardiac medications before your next appointment, please call your pharmacy.

## 2016-09-01 NOTE — Addendum Note (Signed)
Addended by: Parke Poisson E on: 09/01/2016 10:34 AM   Modules accepted: Orders

## 2016-09-01 NOTE — Progress Notes (Signed)
Clinical Summary Adam. House is a 65 y.o.male regular patient of Dr Bronson Ing, seen today as a walk in for chest pain.  1. CAD/ICM - history of anterior MI in 1998, received PCI to LAD. Anterolateral MI 04/2012, received overlapping stent to LAD - echo 06/2014 LVEF 20-25%, restrictive diastolic function  - chest pain started about 1-2 days ago. Dull pain left chest, 5/10 in severity. Started while at rest. No other associated symptoms. Worst with deep breaths. Coming and going since Saturday. Lasts just a few seconds. Different from prior pain. He thinks may be related to stress. No relation to food. No significant SOB or DOE. Has not had to use NG.  2. Lung nodule - 2.8 cm hypermetabolic nodule noted on PET scan  3. Lung fibosis - followed by pulm - being considered for bronch with biopsy under general anesthesia pending cardiology clearance.  Past Medical History:  Diagnosis Date  . Atrial fibrillation ([place])   . CAD (coronary artery disease)    a. ant MI 1998 with PCI to LAD. b. Ant-lat MI 04/2012 tx with overlapping DES to LAD.  [name] CHF (congestive heart failure) ([place])   . CRI (chronic renal insufficiency)   . CVA (cerebral infarction) 11/2010  . DVT (deep venous thrombosis) ([place])   . Esophageal reflux   . Gout   . Heart murmur   . Hypercholesteremia   . ICD (implantable cardiac defibrillator) in place   . Impotence of organic origin   . Ischemic cardiomyopathy    EF 25-30% by echo 04/2012; [place]  . Lung disease, interstitial ([place])   . Myocardial infarction 1998 and 2005  . Peripheral arterial disease ([place])   . 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 Stroke [facility]) ? 2006  . Systolic CHF ([facility])    NYHA Class II/III  . Tobacco use disorder   . Unspecified essential hypertension      Allergies  Allergen Reactions  . Lisinopril Anaphylaxis and Swelling    angioedema  . Naproxen Hives     Current Outpatient Prescriptions  Medication Sig Dispense  Refill  . albuterol (PROVENTIL HFA;VENTOLIN HFA) 108 (90 Base) MCG/ACT inhaler Inhale 2 puffs into the lungs every 6 (six) hours as needed for wheezing or shortness of breath. 1 Inhaler 1  . allopurinol (ZYLOPRIM) 300 MG tablet Take 300 mg by mouth daily.  4  . ALPRAZolam (XANAX) 0.5 MG tablet Take 0.5 mg by mouth 2 (two) times daily as needed for anxiety.    . colchicine 0.6 MG tablet Take 0.6 mg by mouth daily.    . furosemide (LASIX) 20 MG tablet Take 1 tablet (20 mg total) by mouth daily. 90 tablet 3  . ipratropium (ATROVENT) 0.02 % nebulizer solution Take 0.5 mg by nebulization every 6 (six) hours as needed for wheezing or shortness of breath.    . isosorbide mononitrate (IMDUR) 30 MG 24 hr tablet Take 1 tablet (30 mg total) by mouth daily. 90 tablet 3  . losartan (COZAAR) 25 MG tablet Take 1 tablet (25 mg total) by mouth daily. 30 tablet 6  . NITROSTAT 0.4 MG SL tablet PLACE 1 TABLET UNDER TONGUE EVERY 5 MINUTES AS NEEDED FOR CHEST PAIN (UP TO 3 DOSES) 25 tablet 3  . omeprazole (PRILOSEC) 20 MG capsule Take 20 mg by mouth daily.  2  . potassium chloride (K-DUR) 10 MEQ tablet Take 10 mEq by mouth 2 (two) times daily.    . potassium chloride  SA (KLOR-CON M20) 20 MEQ tablet Take 1 tablet (20 mEq total) by mouth daily. 30 tablet 6  . simvastatin (ZOCOR) 10 MG tablet Take 1 tablet (10 mg total) by mouth at bedtime. 90 tablet 3  . sotalol (BETAPACE) 120 MG tablet Take 120 mg by mouth 2 (two) times daily.  4  . ULORIC 40 MG tablet Take 1 tablet by mouth daily.  3  . warfarin (COUMADIN) 5 MG tablet Take 1 tablet (5 mg total) by mouth daily. 30 tablet 6   No current facility-administered medications for this visit.      Past Surgical History:  Procedure Laterality Date  . ANTERIOR CERVICAL DECOMP/DISCECTOMY FUSION  2007   "put in 4 screws to hold my head on; ruptured C5; job related injury" (08/24/2012)  . CARDIAC DEFIBRILLATOR PLACEMENT  2011   [employer]  he is a MADIT RIT study  patient  . CORONARY ANGIOPLASTY WITH STENT PLACEMENT  05/1997; 04/2012   "1 + 2; total of 3" (08/24/2012)  . EMBOLECTOMY Right 06/24/2014   Procedure: RIGHT FEMORAL EMBOLECTOMY,  ;  Surgeon: Angelia Mould, MD;  Location: [facility] OR;  Service: Vascular;  Laterality: Right;  Right Femoral Embolectomy, with bovine patch angioplasty,.  . FASCIOTOMY Left 06/24/2014   Procedure: FASCIOTOMY;  Surgeon: Angelia Mould, MD;  Location: [place];  Service: Vascular;  Laterality: Left;  four compartment Fasciotomy.  [name] LEFT HEART CATHETERIZATION WITH CORONARY ANGIOGRAM N/A 04/22/2012   Procedure: LEFT HEART CATHETERIZATION WITH CORONARY ANGIOGRAM;  Surgeon: Leonie Man, MD;  Location: [facility] CATH LAB;  Service: Cardiovascular;  Laterality: N/A;  . PERCUTANEOUS CORONARY STENT INTERVENTION (PCI-S)  04/22/2012   Procedure: PERCUTANEOUS CORONARY STENT INTERVENTION (PCI-S);  Surgeon: Leonie Man, MD;  Location: [facility] CATH LAB;  Service: Cardiovascular;;     Allergies  Allergen Reactions  . Lisinopril Anaphylaxis and Swelling    angioedema  . Naproxen Hives      Family History  Problem Relation Age of Onset  . Diabetes Mother   . Cancer Mother     Ovarian  . Heart disease Mother   . Hyperlipidemia Mother   . Hypertension Mother   . Heart attack Mother   . Heart disease Father   . Hyperlipidemia Father   . Hypertension Father   . Heart attack Father   . Heart disease Brother   . Heart attack Brother   . Cancer Sister     Ovarian-Liver-Lung-Brain  . Heart disease Sister   . Heart attack Daughter   . Heart disease Daughter     Before age 6  . Allergies Daughter   . Cancer Sister     Lung  . Heart disease Sister   . Emphysema Paternal Grandfather      Social History Adam. Yeo reports that he quit smoking about 3 years ago. His smoking use included Cigarettes. He started smoking about 32 years ago. He has a 30.00 pack-year smoking history. His smokeless tobacco use includes  Snuff. Adam. Ladnier reports that he does not drink alcohol.   Review of Systems CONSTITUTIONAL: No weight loss, fever, chills, weakness or fatigue.  HEENT: Eyes: No visual loss, blurred vision, double vision or yellow sclerae.No hearing loss, sneezing, congestion, runny nose or sore throat.  SKIN: No rash or itching.  CARDIOVASCULAR: per HPI RESPIRATORY: No shortness of breath, cough or sputum.  GASTROINTESTINAL: No anorexia, nausea, vomiting or diarrhea. No abdominal pain or blood.  GENITOURINARY: No burning on urination, no polyuria NEUROLOGICAL: No  headache, dizziness, syncope, paralysis, ataxia, numbness or tingling in the extremities. No change in bowel or bladder control.  MUSCULOSKELETAL: No muscle, back pain, joint pain or stiffness.  LYMPHATICS: No enlarged nodes. No history of splenectomy.  PSYCHIATRIC: No history of depression or anxiety.  ENDOCRINOLOGIC: No reports of sweating, cold or heat intolerance. No polyuria or polydipsia.  [name]   Physical Examination Vitals:   09/01/16 1040  BP: 111/74  Pulse: 74   Vitals:   09/01/16 1040  Weight: 193 lb (87.5 kg)  Height: '5\' 9"'$  (1.753 m)    Gen: resting comfortably, no acute distress HEENT: no scleral icterus, pupils equal round and reactive, no palptable cervical adenopathy,  CV: RRR, no m/r/g, no jvd Resp: Clear to auscultation bilaterally GI: abdomen is soft, non-tender, non-distended, normal bowel sounds, no hepatosplenomegaly MSK: extremities are warm, no edema.  Skin: warm, no rash Neuro:  no focal deficits Psych: appropriate affect    Assessment and Plan  1. Chest pain - unclear etiology. We will obtain an exercise nuclear stress test to further evaluate - EKG in clinic without acute ishcemic changes     Arnoldo Lenis, M.D.

## 2016-09-01 NOTE — Progress Notes (Signed)
Patient walked into office this morning c/o chest pain & requesting EKG.  EKG & vitals done.  Stated he has taken all medications for the morning already.  No dizziness.  SOB is no more than usual.  Chest pain going on all weekend, 5-6/10.  Did not take his NTG, stated he has never taken it outside of when he was in the hospital.    Scheduled to have lung biopsy on 09/24/2016.    Discussed above with Dr. Harl Bowie - add to schedule per MD request.

## 2016-09-01 NOTE — Telephone Encounter (Signed)
Any day of the week of Jan 8th. Mornings after 8:30 is preferred but I can do other times as well.

## 2016-09-02 ENCOUNTER — Telehealth: Payer: Self-pay | Admitting: *Deleted

## 2016-09-02 NOTE — Telephone Encounter (Signed)
-----   Message from Tiajuana Amass, [place] sent at 09/01/2016 11:02 AM EST ----- Regarding: RE: remote check  No he should not only when he is scheduled or feeling symptoms  ----- Message ----- From: Laurine Blazer, LPN Sent: [id number]/8811  10:45 AM To: Royston Cowper [place], CMA Subject: remote check                                   Patient walked into office with chest pain - Dr. Harl Bowie is seeing for office visit now.  Stated he has been pushing button for remote checks on a daily basis.  Told him that I didn't think he was suppose to unit time for check.    Is this okay for him to do??  Thanks,  Edd Fabian

## 2016-09-02 NOTE — Telephone Encounter (Signed)
Patient notified

## 2016-09-03 ENCOUNTER — Encounter (HOSPITAL_COMMUNITY): Payer: Self-pay

## 2016-09-03 ENCOUNTER — Inpatient Hospital Stay (HOSPITAL_COMMUNITY): Admission: RE | Admit: 2016-09-03 | Payer: Medicare Other | Source: Ambulatory Visit

## 2016-09-03 ENCOUNTER — Encounter (HOSPITAL_COMMUNITY)
Admission: RE | Admit: 2016-09-03 | Discharge: 2016-09-03 | Disposition: A | Discharge status: Home / Self Care | Payer: Medicare Other | Source: Ambulatory Visit | Attending: Cardiology | Admitting: Cardiology

## 2016-09-03 DIAGNOSIS — I252 Old myocardial infarction: Secondary | ICD-10-CM | POA: Diagnosis not present

## 2016-09-03 DIAGNOSIS — R079 Chest pain, unspecified: Secondary | ICD-10-CM | POA: Diagnosis not present

## 2016-09-03 HISTORY — DX: Unspecified asthma, uncomplicated: J45.909

## 2016-09-03 LAB — NM MYOCAR MULTI W/SPECT W/WALL MOTION / EF
CHL CUP NUCLEAR SRS: 37
CHL CUP NUCLEAR SSS: 37
LV sys vol: 218 mL
LVDIAVOL: 263 mL (ref 62–150)
Peak HR: 79 {beats}/min
RATE: 0.41
Rest HR: 68 {beats}/min
SDS: 0
TID: 0.96

## 2016-09-03 MED ORDER — TECHNETIUM TC 99M TETROFOSMIN IV KIT
10.0000 | PACK | Freq: Once | INTRAVENOUS | Status: AC | PRN
  Administered 2016-09-03: 10.6 via INTRAVENOUS

## 2016-09-03 MED ORDER — REGADENOSON 0.4 MG/5ML IV SOLN
INTRAVENOUS | Status: AC
  Administered 2016-09-03: 0.4 mg via INTRAVENOUS
  Filled 2016-09-03: qty 5

## 2016-09-03 MED ORDER — TECHNETIUM TC 99M TETROFOSMIN IV KIT
30.0000 | PACK | Freq: Once | INTRAVENOUS | Status: AC | PRN
  Administered 2016-09-03: 30.8 via INTRAVENOUS

## 2016-09-03 MED ORDER — SODIUM CHLORIDE 0.9% FLUSH
INTRAVENOUS | Status: AC
Start: 2016-09-03 — End: 2016-09-03
  Administered 2016-09-03: 10 mL via INTRAVENOUS
  Filled 2016-09-03: qty 10

## 2016-09-05 ENCOUNTER — Telehealth: Payer: Self-pay | Admitting: *Deleted

## 2016-09-05 NOTE — Telephone Encounter (Signed)
Pt aware - routed to pcp - wants to clarify if ok to proceed with up coming procedure on 09/24/16 - in EPIC - will forward to Dr. Bronson Ing - out of office until next week

## 2016-09-05 NOTE — Telephone Encounter (Signed)
-----   Message from Arnoldo Lenis, MD sent at 09/05/2016 10:09 AM EST ----- Stress test overall shows evidence of old heart damage, no evidence of new blockages. No evidence his recent symptoms are heart related. I have forwarded results to Dr Glendora Score MD

## 2016-09-11 ENCOUNTER — Telehealth: Payer: Self-pay | Admitting: Pulmonary Disease

## 2016-09-11 NOTE — Telephone Encounter (Signed)
Spoke with pt and informed that he will receive a call from the respiratory dept prior to his bronch appt on 09/24/16 with all the instructions for his procedure.  Pt verbalized understanding.  Nothing further needed.

## 2016-09-11 NOTE — Telephone Encounter (Signed)
Patient is returning phone call.  °

## 2016-09-11 NOTE — Telephone Encounter (Signed)
LMTC x 1  

## 2016-09-11 NOTE — Telephone Encounter (Signed)
Pt aware.

## 2016-09-11 NOTE — Telephone Encounter (Signed)
Patient request to call on mobile number

## 2016-09-11 NOTE — Telephone Encounter (Signed)
Ok to proceed with lung biopsy.

## 2016-09-11 NOTE — Telephone Encounter (Signed)
Pt returning call to nurse concerning his procedure he can be reached @ [phone].Adam House

## 2016-09-17 ENCOUNTER — Telehealth: Payer: Self-pay | Admitting: Pulmonary Disease

## 2016-09-17 NOTE — Telephone Encounter (Signed)
Spoke with Helene Kelp at [facility] admissions, states that bronch orders need to be placed by 09/25/2015.  I advised that PM is out of the office this week.  Helene Kelp is aware, states these orders need to be put in as soon as possible.  PM please place orders as you're able.  Thanks!

## 2016-09-19 ENCOUNTER — Other Ambulatory Visit (HOSPITAL_COMMUNITY): Payer: Self-pay | Admitting: *Deleted

## 2016-09-19 ENCOUNTER — Encounter (HOSPITAL_COMMUNITY)
Admission: RE | Admit: 2016-09-19 | Discharge: 2016-09-19 | Disposition: A | Discharge status: Home / Self Care | Payer: Medicare Other | Source: Ambulatory Visit | Attending: Pulmonary Disease | Admitting: Pulmonary Disease

## 2016-09-19 ENCOUNTER — Encounter (HOSPITAL_COMMUNITY): Payer: Self-pay

## 2016-09-19 DIAGNOSIS — Z01818 Encounter for other preprocedural examination: Secondary | ICD-10-CM | POA: Insufficient documentation

## 2016-09-19 HISTORY — DX: Pneumonia, unspecified organism: J18.9

## 2016-09-19 HISTORY — DX: Unspecified osteoarthritis, unspecified site: M19.90

## 2016-09-19 HISTORY — DX: Occlusion and stenosis of right carotid artery: I65.21

## 2016-09-19 LAB — CBC
HCT: 43.5 % (ref 39.0–52.0)
HEMOGLOBIN: 15.6 g/dL (ref 13.0–17.0)
MCH: 29.2 pg (ref 26.0–34.0)
MCHC: 35.9 g/dL (ref 30.0–36.0)
MCV: 81.3 fL (ref 78.0–100.0)
PLATELETS: 259 10*3/uL (ref 150–400)
RBC: 5.35 MIL/uL (ref 4.22–5.81)
RDW: 15.4 % (ref 11.5–15.5)
WBC: 8.2 10*3/uL (ref 4.0–10.5)

## 2016-09-19 LAB — BASIC METABOLIC PANEL
ANION GAP: 11 (ref 5–15)
BUN: 15 mg/dL (ref 6–20)
CALCIUM: 9.3 mg/dL (ref 8.9–10.3)
CO2: 19 mmol/L — AB (ref 22–32)
CREATININE: 1.81 mg/dL — AB (ref 0.61–1.24)
Chloride: 100 mmol/L — ABNORMAL LOW (ref 101–111)
GFR, EST AFRICAN AMERICAN: 44 mL/min — AB (ref >60)
GFR, EST NON AFRICAN AMERICAN: 38 mL/min — AB (ref >60)
Glucose, Bld: 96 mg/dL (ref 65–99)
Potassium: 4.1 mmol/L (ref 3.5–5.1)
SODIUM: 130 mmol/L — AB (ref 135–145)

## 2016-09-19 NOTE — Pre-Procedure Instructions (Addendum)
Adam House  09/19/2016     Your procedure is scheduled on Wednesday, September 24, 2016 at 10:00 AM.   Report to [facility] Entrance "A" Admitting Office at 8:00 AM.   Call this number if you have problems the morning of surgery: [phone]   Questions prior to day of surgery, please call [phone] between 8 & 4 PM.   Remember:  Do not eat food or drink liquids after midnight Tuesday, 09/23/16.  Take these medicines the morning of surgery with A SIP OF WATER:  Inhalers/neb  if needed, sotalol(betapace)xanax if needed, nitrostat if needed,cochicine,,omeprazole(prilosec)  Stop coumadin per dr 09/20/16 per [facility] office  STOP all herbel meds, nsaids (aleve,naproxen,advil,ibuprofen) TODAY including all vitamins    Do not wear jewelry.  Do not wear lotions, powders, or cologne.  Men may shave face and neck.  Do not bring valuables to the hospital.  [facility] is not responsible for any belongings or valuables.  Contacts, dentures or bridgework may not be worn into surgery.  Leave your suitcase in the car.  After surgery it may be brought to your room.  For patients admitted to the hospital, discharge time will be determined by your treatment team.  Patients discharged the day of surgery will not be allowed to drive home.   Special instructions:  [place] - Preparing for Surgery  Before surgery, you can play an important role.  Because skin is not sterile, your skin needs to be as free of germs as possible.  You can reduce the number of germs on you skin by washing with CHG (chlorahexidine gluconate) soap before surgery.  CHG is an antiseptic cleaner which kills germs and bonds with the skin to continue killing germs even after washing.  Please DO NOT use if you have an allergy to CHG or antibacterial soaps.  If your skin becomes reddened/irritated stop using the CHG and inform your nurse when you arrive at Short Stay.  Do not shave (including legs and  underarms) for at least 48 hours prior to the first CHG shower.  You may shave your face.  Please follow these instructions carefully:   1.  Shower with CHG Soap the night before surgery and the                    morning of Surgery.  2.  If you choose to wash your hair, wash your hair first as usual with your       normal shampoo.  3.  After you shampoo, rinse your hair and body thoroughly to remove the shampoo.  4.  Use CHG as you would any other liquid soap.  You can apply chg directly       to the skin and wash gently with scrungie or a clean washcloth.  5.  Apply the CHG Soap to your body ONLY FROM THE NECK DOWN.        Do not use on open wounds or open sores.  Avoid contact with your eyes, ears, mouth and genitals (private parts).  Wash genitals (private parts) with your normal soap.  6.  Wash thoroughly, paying special attention to the area where your surgery        will be performed.  7.  Thoroughly rinse your body with warm water from the neck down.  8.  DO NOT shower/wash with your normal soap after using and rinsing off       the CHG Soap.  9.  Pat yourself dry with a clean towel.            10.  Wear clean pajamas.            11.  Place clean sheets on your bed the night of your first shower and do not        sleep with pets.  Day of Surgery  Do not apply any lotions the morning of surgery.  Please wear clean clothes to the hospital.   Please read over the fact sheets that you were given.

## 2016-09-22 ENCOUNTER — Ambulatory Visit: Payer: Self-pay | Admitting: Rheumatology

## 2016-09-22 ENCOUNTER — Encounter (HOSPITAL_COMMUNITY): Payer: Self-pay

## 2016-09-22 ENCOUNTER — Telehealth: Payer: Self-pay | Admitting: Cardiovascular Disease

## 2016-09-22 NOTE — Telephone Encounter (Signed)
Adam House called stating that he is scheduled for surgery for Wednesday for a lung mass.  He is concerned about which medications he is suppose to be taking before the surgery.   Please call patient.

## 2016-09-22 NOTE — Progress Notes (Signed)
Anesthesia chart review: Patient is a 66 year old male scheduled for video bronchoscopy with endobronchial ultrasound on 09/24/2016 by Dr. Vaughan Browner. DX: LLL lung nodule (hypermetabolic by PET).  History includes former smoker (quit 09/15/2012), CAD, anterior MI '98 s/p LAD PCI and anterolateral STEMI 04/2012 s/p DES LAD, ischemic cardiomyopathy, chronic systolic CHF, s/p [employer] ICD 12/10/2009, VT 07/07/2014 s/p ICD discharge (amiodarone discontinued; metoprolol continued and sotalol increased), atrial fibrillation/PAF, murmur, hypertension, hypercholesterolemia, chronic kidney disease, CVA 2006(?) and 2012, interstitial lung disease, asthma, acute ischemic RLE (embolic; with apical akinesis, apical thrombus suspected source although not mentioned on echo; anticoagulation recommended) s/p right femoral embolectomy/fasciotomy 06/24/2014, C5-6 ACDF '07.   - PCP is Dr. Jerene Bears. - Cardiologist is Dr. Bronson Ing. He had a recent stress test that was high risk--but due to large area of infarct and low EF (both old findings). On 09/11/2016 he wrote, "OK to proceed with lung biopsy." He gave permission to hold warfarin (08/28/2016). - EP cardiologist is Dr. Thompson Grayer, last visit 01/18/2016. - Vascular surgeon is Dr. Deitra Mayo. PRN follow-up recommended (for RLE) 10/18/2014. He has been seeing Dr. Kellie Simmering for carotid disease.  Meds include albuterol, allopurinol, Xanax, colchicine, Lasix, Atrovent, Imdur, nitroglycerin, Prilosec, KCl, Zocor, sotalol, Uloric, warfarin. Per PAT RN notes, patient was instructed to hold warfarin four days prior to surgery and resume post-operatively.   EKG 09/01/2016: Atrial rhythm, low voltage limb leads, anterolateral infarct, age undetermined, old inferior infarct.  Nuclear stress test 09/03/2016:  There was no ST segment deviation noted during stress.  Findings consistent with prior myocardial infarction.  This is a high risk study based on degree of  myocardial scar.  Nuclear stress EF: 17%.  Defect 1: There is a large defect of severe severity present in the basal anterior, basal inferior, mid anterior, mid inferior, apical anterior, apical septal, apical inferior, apical lateral and apex location. (Results reviewed by Dr. Harl Bowie, who wrote on 09/05/2016, "Stress test overall shows evidence of old heart damage, no evidence of new blockages. No evidence his recent symptoms are heart related. I have forwarded results to Dr Raliegh Ip.")  Echo 06/27/2014: Study Conclusions - Left ventricle: The cavity size was mildly dilated. Wall thickness was increased in a pattern of mild LVH. Systolic function was severely reduced. The estimated ejection fraction was in the range of 20% to 25%. Diffuse hypokinesis. There is akinesis of the apical myocardium. Doppler parameters are consistent with restrictive physiology, indicative of decreased left ventricular diastolic compliance and/or increased left atrial pressure. - Aortic valve: There was mild regurgitation. - Mitral valve: Calcified annulus. Mildly thickened leaflets . - Left atrium: The atrium was severely dilated. - Right ventricle: Systolic function was mildly reduced. - Right atrium: The atrium was mildly dilated. Impressions: - Global hypokinesis with apical akinesis; overall severely reduced LV function; mild LVE and LVH; restrictive filling; biatrial enlargement; mildly reduced RV function; mild AI.  Cardiac cath/PCI 04/22/2012: Coronary Anatomy:  Left Main: Large-caliber vessel that trifurcates into the LAD, Ramus Intermedius, Left Circumflex. There is mild distal tapering, but otherwise angiographically normal LAD: Initially begins as a large caliber vessel the very large first septal perforator trunk repaired with a small-caliber first diagonal branch. The arteries in 100% occluded at the next paired Septal trunk and very small second diagonal.  First Diagonal:  Small-caliber vessel, diffuse mild luminal irregularities  Following PCI of the initial lesion in the mid LAD a second 70-80% lesion was found following placement of the initial stent. This was considered Lesion #2.  Following this there is a more distal 40% lesion in the apical roughly 50% lesion just before the vessel gives off apical diagonal branches and wraps around the LAD covering about one third in to the [place] territory. The vessel tapers into a relatively small caliber vessel around the apex with diffuse mild luminal irregularities. Left Circumflex: This begins as a moderate to large caliber vessel that bifurcates in the AV groove into 2 small caliber Left Posterior Lateral Branches each bifurcating. Ramus intermedius: Large caliber vessel covering essentially a high OM distribution although was appears him of the LAD it bifurcates distally into a 2 obtuse marginal distributions; minimal luminal irregularites  RCA: The vessel begins a large caliber vessel but has a proximal 40-50% lesion at the first bend and then continues on as a moderate caliber vessel with several RV marginal branches and is diffusely diseased at 20-30% lesions throughout the mid to distal portion. Then bifurcates into a very small Right Posterior Lateral System and a small-caliber/short Right Posterior Descending Artery both of which are diffusely diseased with mild to moderate luminal irregularities.  INTERVENTION:    Successful to site PCI of the mid and distal LAD with 2 overlapping Promus drug-eluting stents as described resulting in restoration of TIMI-3 flow downstream and resolution of chest pain an ECG changes.  Carotid ultrasound 03/20/2015: Impression: 1. Known occluded right ICA. 2. Less than 40% left ICA stenosis. 3. Greater than 50% left common carotid artery stenosis.  CT chest high resolution 08/04/2016: IMPRESSION: 1. Pulmonary parenchymal pattern of fibrosis may be minimally progressive from  05/04/2014, indicative of usual interstitial pneumonitis. 2. New lobulated left lower lobe nodule, worrisome for primary bronchogenic carcinoma. These results will be called to the ordering clinician or representative by the Radiologist Assistant, and communication documented in the PACS or zVision Dashboard. 3. Aortic atherosclerosis (ICD10-170.0). Coronary artery calcification. 4. Borderline enlarged pulmonary arteries. 5. Cholelithiasis.  PFTs 08/28/2016: FVC 2.59 67%, FEV1 2.05 69%, DLCOunc 7.84 (24%). No airway obstruction, no significant bronchodilator response Moderate restriction with severely reduced DLCO.   Preoperative labs noted. Sodium 130, potassium 4.1, BUN 15 creatinine 1.81 (previous Cr 1.67-1.96 06/2014, 1.37-1.47 01/2015-03/2016, scanned under Media tab). Glucose 96. CBC within normal limits. He is scheduled for a PT/INR on arrival. I'll recheck BMET as well to reassess renal function--known CKD, but most recent creatinine results have been closer to the 1.4-1.5 range.   Further evaluation on the day of surgery by his anesthesiologist to ensure no acute changes and follow-up lab results acceptable prior to surgery.  George Hugh [facility] Short Stay Center/Anesthesiology Phone [phone] 09/22/2016 12:57 PM

## 2016-09-22 NOTE — Telephone Encounter (Signed)
Patient calling our office (cardiology) - questioning meds to hold before his surgery for his lung mass.    Did your office need cardiac clearance?  If so, please fax request as we have not received anything from your office.

## 2016-09-23 ENCOUNTER — Telehealth: Payer: Self-pay | Admitting: Family

## 2016-09-23 NOTE — Telephone Encounter (Signed)
I called the patient this morning and spoke with him regarding rescheduling his missed appt in 03/2016 for a carotid US and to see NP.  He states he is scheduled for a lung biopsy tomorrow 09/24/16 and is not sure how long the recovery period may be for that surgery. He did not want to schedule an appointment at this time. I told the patient I would call him in a month or 6 wks from now to see if he wanted to reschedule at that time.  awt

## 2016-09-23 NOTE — Telephone Encounter (Signed)
Spoke with Helene Kelp and advised her pre-op orders are placed and ready.

## 2016-09-23 NOTE — Telephone Encounter (Signed)
PM please advise if these orders have been placed- orders must be placed for bronch tomorrow.  Thanks!

## 2016-09-23 NOTE — Telephone Encounter (Signed)
Yes. Preop orders are done. Thanks

## 2016-09-23 NOTE — Telephone Encounter (Signed)
-----   Message from Mena Goes, RN sent at 09/22/2016  4:37 PM EST ----- Regarding: RE: carotid f/u ?? Yes please do especially because of Allison's note.  ----- Message ----- From: Lujean Amel Sent: 09/22/2016   4:19 PM To: Mena Goes, RN Subject: RE: carotid f/u ??                             Zigmund Daniel, So it seems this patient was called on 09/10/16 to reschedule from the no show/cancellation list. He declined to reschedule per the note from Blair. I will be happy to reach out to him again tomorrow if you want me to do so.  Thanks, Anne Ng ----- Message ----- From: Mena Goes, RN Sent: 09/22/2016   2:14 PM To: Jacinta Shoe, PA-C, Lujean Amel Subject: RE: carotid f/u ??                             It looks like he was supposed to be seen by our NP last July and have a duplex. These were cancelled d/t transportation issues. I will have our appt staff follow up with him and reschedule. Thanks for the heads up.   ----- Message ----- From: Jacinta Shoe, PA-C Sent: 09/22/2016  12:36 PM To: Mena Goes, RN Subject: carotid f/u ??                                 Zigmund Daniel, [place]! Samyak Sackmann has carotid stenosis (known RICA occlusion) that was being followed by Dr. Kellie Simmering (last visit 03/2015). He was also seeing Dr. Scot Dock following RLE embolectomy (but was given PRN RLE f/u 10/2014). Is Dr. Kellie Simmering still seeing follow-up patients, or will patient eventually need get in with someone else at your office for carotid f/u? Can you ask around--maybe your office already mailed him an request to schedule follow-up. He is getting a LLL lung biopsy this week, so depending on the results that could change some of his priorities, but I didn't want him lost to vascular follow-up if it was still needed. He is also being actively followed by Dr. Bronson Ing at [facility].   George Hugh [facility] Short Stay Center/Anesthesiology Phone [phone] 09/22/2016  12:55 PM

## 2016-09-24 ENCOUNTER — Encounter (HOSPITAL_COMMUNITY): Payer: Self-pay | Admitting: Certified Registered"

## 2016-09-24 ENCOUNTER — Ambulatory Visit (HOSPITAL_COMMUNITY): Payer: Medicare Other | Admitting: Vascular Surgery

## 2016-09-24 ENCOUNTER — Ambulatory Visit (HOSPITAL_COMMUNITY)
Admission: RE | Admit: 2016-09-24 | Discharge: 2016-09-24 | Disposition: A | Discharge status: Home / Self Care | Payer: Medicare Other | Source: Ambulatory Visit | Attending: Pulmonary Disease | Admitting: Pulmonary Disease

## 2016-09-24 ENCOUNTER — Ambulatory Visit (HOSPITAL_COMMUNITY): Payer: Medicare Other

## 2016-09-24 ENCOUNTER — Encounter (HOSPITAL_COMMUNITY)
Admission: RE | Disposition: A | Discharge status: Home / Self Care | Payer: Self-pay | Source: Ambulatory Visit | Attending: Pulmonary Disease

## 2016-09-24 DIAGNOSIS — I714 Abdominal aortic aneurysm, without rupture: Secondary | ICD-10-CM | POA: Diagnosis not present

## 2016-09-24 DIAGNOSIS — R590 Localized enlarged lymph nodes: Secondary | ICD-10-CM | POA: Diagnosis present

## 2016-09-24 DIAGNOSIS — I252 Old myocardial infarction: Secondary | ICD-10-CM | POA: Diagnosis not present

## 2016-09-24 DIAGNOSIS — K219 Gastro-esophageal reflux disease without esophagitis: Secondary | ICD-10-CM | POA: Insufficient documentation

## 2016-09-24 DIAGNOSIS — R911 Solitary pulmonary nodule: Secondary | ICD-10-CM | POA: Insufficient documentation

## 2016-09-24 DIAGNOSIS — C801 Malignant (primary) neoplasm, unspecified: Secondary | ICD-10-CM | POA: Insufficient documentation

## 2016-09-24 DIAGNOSIS — Z8673 Personal history of transient ischemic attack (TIA), and cerebral infarction without residual deficits: Secondary | ICD-10-CM | POA: Diagnosis not present

## 2016-09-24 DIAGNOSIS — Z87891 Personal history of nicotine dependence: Secondary | ICD-10-CM | POA: Insufficient documentation

## 2016-09-24 DIAGNOSIS — I2510 Atherosclerotic heart disease of native coronary artery without angina pectoris: Secondary | ICD-10-CM | POA: Insufficient documentation

## 2016-09-24 DIAGNOSIS — I10 Essential (primary) hypertension: Secondary | ICD-10-CM | POA: Diagnosis not present

## 2016-09-24 DIAGNOSIS — J449 Chronic obstructive pulmonary disease, unspecified: Secondary | ICD-10-CM | POA: Diagnosis not present

## 2016-09-24 DIAGNOSIS — Z9889 Other specified postprocedural states: Secondary | ICD-10-CM

## 2016-09-24 DIAGNOSIS — C771 Secondary and unspecified malignant neoplasm of intrathoracic lymph nodes: Secondary | ICD-10-CM | POA: Diagnosis not present

## 2016-09-24 DIAGNOSIS — I739 Peripheral vascular disease, unspecified: Secondary | ICD-10-CM | POA: Diagnosis not present

## 2016-09-24 DIAGNOSIS — Z86718 Personal history of other venous thrombosis and embolism: Secondary | ICD-10-CM | POA: Insufficient documentation

## 2016-09-24 DIAGNOSIS — Z955 Presence of coronary angioplasty implant and graft: Secondary | ICD-10-CM | POA: Insufficient documentation

## 2016-09-24 HISTORY — PX: VIDEO BRONCHOSCOPY WITH ENDOBRONCHIAL ULTRASOUND: SHX6177

## 2016-09-24 LAB — BASIC METABOLIC PANEL
ANION GAP: 10 (ref 5–15)
BUN: 12 mg/dL (ref 6–20)
CALCIUM: 9.2 mg/dL (ref 8.9–10.3)
CO2: 20 mmol/L — ABNORMAL LOW (ref 22–32)
Chloride: 102 mmol/L (ref 101–111)
Creatinine, Ser: 1.74 mg/dL — ABNORMAL HIGH (ref 0.61–1.24)
GFR, EST AFRICAN AMERICAN: 46 mL/min — AB (ref >60)
GFR, EST NON AFRICAN AMERICAN: 39 mL/min — AB (ref >60)
GLUCOSE: 99 mg/dL (ref 65–99)
Potassium: 4.2 mmol/L (ref 3.5–5.1)
SODIUM: 132 mmol/L — AB (ref 135–145)

## 2016-09-24 LAB — PROTIME-INR
INR: 1.32
PROTHROMBIN TIME: 16.4 s — AB (ref 11.4–15.2)

## 2016-09-24 LAB — APTT: aPTT: 46 seconds — ABNORMAL HIGH (ref 24–36)

## 2016-09-24 SURGERY — BRONCHOSCOPY, WITH EBUS
Anesthesia: General

## 2016-09-24 MED ORDER — ONDANSETRON HCL 4 MG/2ML IJ SOLN
INTRAMUSCULAR | Status: DC | PRN
  Administered 2016-09-24: 4 mg via INTRAVENOUS

## 2016-09-24 MED ORDER — ONDANSETRON HCL 4 MG/2ML IJ SOLN
INTRAMUSCULAR | Status: AC
Start: 2016-09-24 — End: 2016-09-24
  Filled 2016-09-24: qty 2

## 2016-09-24 MED ORDER — FENTANYL CITRATE (PF) 100 MCG/2ML IJ SOLN: 25.0000 ug | INTRAMUSCULAR | Status: DC | PRN

## 2016-09-24 MED ORDER — PHENYLEPHRINE HCL 10 MG/ML IJ SOLN
INTRAMUSCULAR | Status: DC | PRN
  Administered 2016-09-24: 20 ug/min via INTRAVENOUS

## 2016-09-24 MED ORDER — PROPOFOL 10 MG/ML IV BOLUS
INTRAVENOUS | Status: AC
  Filled 2016-09-24: qty 20

## 2016-09-24 MED ORDER — ETOMIDATE 2 MG/ML IV SOLN
INTRAVENOUS | Status: DC | PRN
  Administered 2016-09-24: 20 mg via INTRAVENOUS

## 2016-09-24 MED ORDER — PROMETHAZINE HCL 25 MG/ML IJ SOLN: 6.2500 mg | INTRAMUSCULAR | Status: DC | PRN

## 2016-09-24 MED ORDER — LIDOCAINE HCL (CARDIAC) 20 MG/ML IV SOLN
INTRAVENOUS | Status: DC | PRN
  Administered 2016-09-24: 10 mg via INTRAVENOUS

## 2016-09-24 MED ORDER — SUGAMMADEX SODIUM 200 MG/2ML IV SOLN
INTRAVENOUS | Status: AC
Start: 2016-09-24 — End: 2016-09-24
  Filled 2016-09-24: qty 2

## 2016-09-24 MED ORDER — LACTATED RINGERS IV SOLN
INTRAVENOUS | Status: DC
  Administered 2016-09-24: 08:00:00 via INTRAVENOUS

## 2016-09-24 MED ORDER — PHENYLEPHRINE HCL 10 MG/ML IJ SOLN: INTRAMUSCULAR | Status: DC | PRN

## 2016-09-24 MED ORDER — MEPERIDINE HCL 25 MG/ML IJ SOLN: 6.2500 mg | INTRAMUSCULAR | Status: DC | PRN

## 2016-09-24 MED ORDER — SUGAMMADEX SODIUM 200 MG/2ML IV SOLN
INTRAVENOUS | Status: DC | PRN
Start: 2016-09-24 — End: 2016-09-24
  Administered 2016-09-24: 200 mg via INTRAVENOUS

## 2016-09-24 MED ORDER — 0.9 % SODIUM CHLORIDE (POUR BTL) OPTIME
TOPICAL | Status: DC | PRN
  Administered 2016-09-24: 1000 mL

## 2016-09-24 MED ORDER — LIDOCAINE 2% (20 MG/ML) 5 ML SYRINGE
INTRAMUSCULAR | Status: AC
  Filled 2016-09-24: qty 5

## 2016-09-24 MED ORDER — FENTANYL CITRATE (PF) 100 MCG/2ML IJ SOLN
INTRAMUSCULAR | Status: DC | PRN
  Administered 2016-09-24: 50 ug via INTRAVENOUS

## 2016-09-24 MED ORDER — ROCURONIUM BROMIDE 100 MG/10ML IV SOLN
INTRAVENOUS | Status: DC | PRN
  Administered 2016-09-24: 15 mg via INTRAVENOUS
  Administered 2016-09-24: 10 mg via INTRAVENOUS
  Administered 2016-09-24: 25 mg via INTRAVENOUS

## 2016-09-24 MED ORDER — MIDAZOLAM HCL 2 MG/2ML IJ SOLN
INTRAMUSCULAR | Status: DC | PRN
  Administered 2016-09-24: 1 mg via INTRAVENOUS

## 2016-09-24 MED ORDER — ROCURONIUM BROMIDE 50 MG/5ML IV SOSY
PREFILLED_SYRINGE | INTRAVENOUS | Status: AC
  Filled 2016-09-24: qty 5

## 2016-09-24 MED ORDER — ATROPINE SULFATE 1 MG/ML IJ SOLN
INTRAMUSCULAR | Status: AC
  Filled 2016-09-24: qty 1

## 2016-09-24 MED ORDER — MIDAZOLAM HCL 2 MG/2ML IJ SOLN: 0.5000 mg | Freq: Once | INTRAMUSCULAR | Status: DC | PRN

## 2016-09-24 SURGICAL SUPPLY — 26 items
BRUSH CYTOL CELLEBRITY 1.5X140 (MISCELLANEOUS) IMPLANT
CANISTER SUCTION 2500CC (MISCELLANEOUS) ×3 IMPLANT
CONT SPEC 4OZ CLIKSEAL STRL BL (MISCELLANEOUS) ×5 IMPLANT
COVER DOME SNAP 22 D (MISCELLANEOUS) ×3 IMPLANT
COVER TABLE BACK 60X90 (DRAPES) ×3 IMPLANT
FORCEPS BIOP RJ4 1.8 (CUTTING FORCEPS) IMPLANT
GAUZE SPONGE 4X4 12PLY STRL (GAUZE/BANDAGES/DRESSINGS) ×2 IMPLANT
GLOVE BIO SURGEON STRL SZ7.5 (GLOVE) ×3 IMPLANT
GOWN STRL REUS W/ TWL LRG LVL3 (GOWN DISPOSABLE) ×1 IMPLANT
GOWN STRL REUS W/TWL LRG LVL3 (GOWN DISPOSABLE) ×9
KIT CLEAN ENDO COMPLIANCE (KITS) ×3 IMPLANT
KIT ROOM TURNOVER OR (KITS) ×3 IMPLANT
MARKER SKIN DUAL TIP RULER LAB (MISCELLANEOUS) ×3 IMPLANT
NDL BIOPSY TRANSBRONCH 21G (NEEDLE) IMPLANT
NEEDLE BIOPSY TRANSBRONCH 21G (NEEDLE) IMPLANT
NEEDLE SONO TIP II EBUS (NEEDLE) ×2 IMPLANT
NS IRRIG 1000ML POUR BTL (IV SOLUTION) ×3 IMPLANT
OIL SILICONE PENTAX (PARTS (SERVICE/REPAIRS)) ×2 IMPLANT
PAD ARMBOARD 7.5X6 YLW CONV (MISCELLANEOUS) ×6 IMPLANT
SYR 20CC LL (SYRINGE) ×3 IMPLANT
SYR 20ML ECCENTRIC (SYRINGE) ×6 IMPLANT
SYR 5ML LUER SLIP (SYRINGE) ×3 IMPLANT
TOWEL OR 17X24 6PK STRL BLUE (TOWEL DISPOSABLE) ×3 IMPLANT
TRAP SPECIMEN MUCOUS 40CC (MISCELLANEOUS) ×2 IMPLANT
TUBE CONNECTING 20'X1/4 (TUBING) ×1
TUBE CONNECTING 20X1/4 (TUBING) ×3 IMPLANT

## 2016-09-24 NOTE — Telephone Encounter (Signed)
This message was sent to office taking care of this procedure (pulmonology).  Patient is now currently admitted.

## 2016-09-24 NOTE — H&P (Signed)
Patient examined in Pre op for EBUS biopsy.  [name] is a 66 year old with past medical history of COPD, lung fibrosis. He was evaluated by Dr. 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 There is no clear evidence of honeycombing and there was groundglass opacities in the left upper lobe that suggested alternate diagnosis. He had a ILD panel that showed mild elevation in c ANCA. There is also elevation in CK that was thought to be secondary to crestor. This was held with improvement in CKs. VATS lung biopsy was recommended for further evaluation. He was seen by Dr. Blase Mess but he declined due to the fact that he had issues with DVT at that same time and had to undergo thrombectomy.  CT scan and PET scan findings from 2017 (see results below) are suspicious for primary lung cancer. Pt agreeable to proceed with EBUS bronch with biopsy.   Blood pressure 100/66, pulse 76, temperature 97.9 F (36.6 C), temperature source Oral, resp. rate 20, weight 185 lb (83.9 kg), SpO2 96 %. Awake, Alert, Oriented No Distress No focal neuro deficits CVS- RRR, No MRG RS- Clear, No wheeze, or crackles Abd- Soft, + BS Ext- No edema.  Data Reviewed: PET Scan 08/27/16- .8 cm left lower lobe pulmonary nodule, SUV 22.7. Hypermetabolic left infrahilar, subcarinal, hilar, and paraesophageal adenopathy. Assuming non-small cell lung cancer, this represents T1b N2 M0 disease (stage IIIA). High resolution CT 08/04/16- stable UIP fibrosis. Left lower lobe pulmonary nodule. High-resolution CT 05/04/14- peripheral, basilar reticulation, traction bronchiectasis, groundglass. No definite honeycombing. Chest x-ray 06/11/16-fibrotic changes, bibasilar opacities. Chest x-ray 07/18/16- chronic fibrotic changes. Stable. All images reviewed.  PFTs  08/28/16 FVC 2.69 (69%) FEV1 2.05 (69%] F/F 76 TLC 57% DLCO 24% No airway obstruction, no significant bronchodilator response Moderate  restriction with severely reduced DLCO.  Assessment/Plan: Lung nodule with mediastinal LNs. Positive on PET scan. Suspicious for malignancy. Ok to proceed with EBUS with LN biopsies.  Marshell Garfinkel MD [facility] Pager [phone] If no answer or after 3pm call: [phone] 09/24/2016, 9:32 AM

## 2016-09-24 NOTE — Anesthesia Procedure Notes (Signed)
Procedure Name: Intubation Date/Time: 09/24/2016 9:44 AM Performed by: Barrington Ellison Pre-anesthesia Checklist: Patient identified, Emergency Drugs available, Suction available and Patient being monitored Patient Re-evaluated:Patient Re-evaluated prior to inductionOxygen Delivery Method: Circle System Utilized Preoxygenation: Pre-oxygenation with 100% oxygen Intubation Type: IV induction Ventilation: Mask ventilation without difficulty Laryngoscope Size: Mac and 3 Grade View: Grade I Tube type: Oral Tube size: 8.5 mm Number of attempts: 1 Airway Equipment and Method: Stylet and Oral airway Placement Confirmation: ETT inserted through vocal cords under direct vision,  positive ETCO2 and breath sounds checked- equal and bilateral Secured at: 21 cm Tube secured with: Tape Dental Injury: Teeth and Oropharynx as per pre-operative assessment

## 2016-09-24 NOTE — Anesthesia Postprocedure Evaluation (Signed)
Anesthesia Post Note  Patient: Adam House  Procedure(s) Performed: Procedure(s) (LRB): VIDEO BRONCHOSCOPY WITH ENDOBRONCHIAL ULTRASOUND (N/A)  Patient location during evaluation: PACU Anesthesia Type: General Level of consciousness: awake and alert, patient cooperative and oriented Pain management: pain level controlled Vital Signs Assessment: post-procedure vital signs reviewed and stable Respiratory status: spontaneous breathing, nonlabored ventilation and respiratory function stable Cardiovascular status: blood pressure returned to baseline and stable Postop Assessment: no signs of nausea or vomiting Anesthetic complications: no       Last Vitals:  Vitals:   09/24/16 1153 09/24/16 1200  BP:  104/60  Pulse:  86  Resp:  18  Temp: 36.7 C     Last Pain:  Vitals:   09/24/16 1200  TempSrc:   PainSc: 0-No pain                 Dejohn Ibarra,E. Rehmat Murtagh

## 2016-09-24 NOTE — Procedures (Signed)
Video Bronchoscopy with Endobronchial Ultrasound and  Electromagnetic Navigation Procedure Note  Date of Operation: 09/25/15  Pre-op Diagnosis: Lung nodule and mediastinal LAD  Post-op Diagnosis: Same  Surgeon: Marshell Garfinkel  Assistants:   Anesthesia: General endotracheal anesthesia  Operation: Flexible video fiberoptic bronchoscopy with endobronchial ultrasound and biopsies.  Estimated Blood Loss: Minimal  Complications: None apparent  Indications and History: Adam House is a  y.o. male with hx RLL lung nodule with mediastinal LNs on CT chest. Recommendation made to achieve tissue sampling via endobronchial ultrasound guided nodal biopsies. The risks, benefits, complications, treatment options and expected outcomes were discussed with the patient.  The possibilities of pneumothorax, pneumonia, reaction to medication, pulmonary aspiration, perforation of a viscus, bleeding, failure to diagnose a condition and creating a complication requiring transfusion or operation were discussed with the patient who freely signed the consent.    Description of Procedure: The patient was examined in the preoperative area and history and data from the preprocedure consultation were reviewed. It was deemed appropriate to proceed. The patient was taken to OR 10, identified as Adam House and the procedure verified as Flexible Video Fiberoptic Bronchoscopy.  A Time Out was held and the above information confirmed. After being taken to the operating room general anesthesia was initiated and the patient  was orally intubated. The video fiberoptic bronchoscope was introduced via the endotracheal tube and a general inspection was performed which showed normal endobronchial anatomy. The standard scope was then withdrawn and the endobronchial ultrasound was used to identify and characterize the peritracheal, hilar and subcarinal LNs. Inspection showed enlargement of station 7 and borderline  enlargement of 4L nodes. Using real-time ultrasound guidance Wang needle biopsies were take from Station 7 and 4L nodes and were sent for cytology and pathology.  At the end of the procedure a general airway inspection was performed and there was no evidence of active bleeding. The bronchoscope was removed. The patient tolerated the procedure well. There was no significant blood loss and there were no obvious complications. A post-procedural chest x-ray is pending. The patient tolerated the procedure well without apparent complications. There was no significant blood loss. The bronchoscope was withdrawn. Anesthesia was reversed and the patient was taken to the PACU for recovery.   Samples: 1. Wang needle biopsies from 7 node 2. Wang needle biopsies from 4L node  Plans:  The patient will be discharged from the PACU to home when recovered from anesthesia and after chest x-ray is reviewed.  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 I have informed his family and will make a referral to Dr. Earlie Server, Oncology. He will need a MRI of brain as well.   Marshell Garfinkel MD [place] [facility] Pager [phone] If no answer or after 3pm call: [phone] 09/24/2016, 11:03 AM

## 2016-09-24 NOTE — Transfer of Care (Signed)
Immediate Anesthesia Transfer of Care Note  Patient: Adam House  Procedure(s) Performed: Procedure(s): VIDEO BRONCHOSCOPY WITH ENDOBRONCHIAL ULTRASOUND (N/A)  Patient Location: PACU  Anesthesia Type:General  Level of Consciousness: awake and patient cooperative  Airway & Oxygen Therapy: Patient Spontanous Breathing and Patient connected to nasal cannula oxygen  Post-op Assessment: Report given to RN  Post vital signs: Reviewed and stable  Last Vitals:  Vitals:   09/24/16 0750  BP: 100/66  Pulse: 76  Resp: 20  Temp: 36.6 C    Last Pain:  Vitals:   09/24/16 0750  TempSrc: Oral         Complications: No apparent anesthesia complications

## 2016-09-24 NOTE — Discharge Instructions (Addendum)
Please resume all your medications including Coumadin

## 2016-09-24 NOTE — Anesthesia Preprocedure Evaluation (Addendum)
Anesthesia Evaluation  Patient identified by MRN, date of birth, ID band Patient awake    Reviewed: Allergy & Precautions, NPO status , Patient's Chart, lab work & pertinent test results  History of Anesthesia Complications Negative for: history of anesthetic complications  Airway Mallampati: I  TM Distance: >3 FB Neck ROM: Full    Dental  (+) Dental Advisory Given   Pulmonary COPD,  COPD inhaler, former smoker,    breath sounds clear to auscultation       Cardiovascular hypertension, Pt. on medications and Pt. on home beta blockers (-) angina+ CAD, + Past MI, + Cardiac Stents, + Peripheral Vascular Disease (AAA) and + DVT  + dysrhythmias + Cardiac Defibrillator (last shocked pt 1 year ago)  Rhythm:Regular Rate:Normal  '15 ECHO: EF 20% to 25%. Diffuse hypokinesis, akinesis of the apical myocardium. Valves OK   Neuro/Psych CVA, No Residual Symptoms negative psych ROS   GI/Hepatic Neg liver ROS, GERD  Medicated and Controlled,  Endo/Other  negative endocrine ROS  Renal/GU Renal InsufficiencyRenal disease (creat 1.74)     Musculoskeletal  (+) Arthritis , Osteoarthritis,    Abdominal   Peds  Hematology negative hematology ROS (+)   Anesthesia Other Findings   Reproductive/Obstetrics                            Anesthesia Physical Anesthesia Plan  ASA: IV  Anesthesia Plan: General   Post-op Pain Management:    Induction: Intravenous  Airway Management Planned: Oral ETT  Additional Equipment:   Intra-op Plan:   Post-operative Plan: Extubation in OR  Informed Consent: I have reviewed the patients History and Physical, chart, labs and discussed the procedure including the risks, benefits and alternatives for the proposed anesthesia with the patient or authorized representative who has indicated his/her understanding and acceptance.     Plan Discussed with: CRNA and  Surgeon  Anesthesia Plan Comments: (Plan routine monitors, GETA)        Anesthesia Quick Evaluation

## 2016-09-25 ENCOUNTER — Telehealth: Payer: Self-pay | Admitting: Pulmonary Disease

## 2016-09-25 ENCOUNTER — Encounter (HOSPITAL_COMMUNITY): Payer: Self-pay | Admitting: Pulmonary Disease

## 2016-09-25 DIAGNOSIS — C3490 Malignant neoplasm of unspecified part of unspecified bronchus or lung: Secondary | ICD-10-CM

## 2016-09-25 NOTE — Addendum Note (Signed)
Addended by: Parke Poisson E on: 09/25/2016 04:33 PM   Modules accepted: Orders

## 2016-09-25 NOTE — Telephone Encounter (Signed)
Patient unable to have MRI because of defibrillator (checked with the MRI dept to make sure) Per PM: change to CT head w/ contrast Order placed MRI order cancelled  Nothing further needed; will sign off.

## 2016-09-25 NOTE — Telephone Encounter (Signed)
Message  Received: Yesterday  Message Contents  Marshell Garfinkel, MD  Rinaldo Ratel, [place],   Mr . Maclellan had a EBUS today with prelim diagnosis of lung cancer. Can you please put in a referral to Dr. Earlie Server, Oncology and an order for MRI brain with contrast. Thanks   Praveen Mannam    Order placed Nothing further needed; will sign off

## 2016-09-30 ENCOUNTER — Telehealth: Payer: Self-pay | Admitting: Pulmonary Disease

## 2016-09-30 ENCOUNTER — Other Ambulatory Visit (HOSPITAL_COMMUNITY): Payer: Self-pay | Admitting: Hematology & Oncology

## 2016-09-30 DIAGNOSIS — C3492 Malignant neoplasm of unspecified part of left bronchus or lung: Secondary | ICD-10-CM

## 2016-09-30 DIAGNOSIS — C3490 Malignant neoplasm of unspecified part of unspecified bronchus or lung: Secondary | ICD-10-CM

## 2016-09-30 NOTE — Telephone Encounter (Signed)
Spoke with pt. He is needing to be scheduled for an MRI of his brain. Per previous telephone encounters (09/25/16), pt can't have an MRI due to having a defibrillator. PM wanted the pt to be scheduled for a CT head with contrast. The order was not placed for this. Order has been placed for CT head with and without contrast due to regulations per Epic. Pt is aware that we will get this scheduled at [facility] for him. Nothing further was needed at this time.

## 2016-09-30 NOTE — Progress Notes (Signed)
START ON PATHWAY REGIMEN - Small Cell Lung  LOS15: Etoposide Days 1, 2, 3 + Cisplatin Day 1 q21 Days x 4 Cycles with Concurrent Radiation**   A cycle is every 21 days:     Etoposide (Toposar(R)) 100 mg/m2 in a total of 500 mL NS IV over 2 hours days 1, 2, and 3 Dose Mod: None     Cisplatin (Platinol(R)) 75 mg/m2 in a total of 500 mL NS IV over 2 hours day 1 only. **Prehydrate and consider post-hydration** Dose Mod: None  **Always confirm dose/schedule in your pharmacy ordering system**    Patient Characteristics: Limited Stage, First Line Check here if patient was staged using an edition prior to AJCC Staging - 8th Edition (i.e., prior to September 15, 2016)? false Stage Grouping: Limited AJCC T Category: T1b AJCC N Category: N2 AJCC M Category: M0 AJCC 8 Stage Grouping: IIIA Line of therapy: First Line Would you be surprised if this patient died  in the next year? I would be surprised if this patient died in the next year  Intent of Therapy: Curative Intent, Discussed with Patient

## 2016-10-01 ENCOUNTER — Encounter (HOSPITAL_COMMUNITY): Payer: Self-pay | Admitting: Emergency Medicine

## 2016-10-01 ENCOUNTER — Encounter (HOSPITAL_COMMUNITY): Payer: Medicare Other | Attending: Hematology & Oncology

## 2016-10-01 ENCOUNTER — Other Ambulatory Visit (HOSPITAL_COMMUNITY): Payer: Self-pay | Admitting: Oncology

## 2016-10-01 ENCOUNTER — Telehealth (HOSPITAL_COMMUNITY): Payer: Self-pay | Admitting: *Deleted

## 2016-10-01 ENCOUNTER — Ambulatory Visit (HOSPITAL_COMMUNITY): Payer: Medicare Other | Admitting: Oncology

## 2016-10-01 DIAGNOSIS — C3492 Malignant neoplasm of unspecified part of left bronchus or lung: Secondary | ICD-10-CM | POA: Diagnosis present

## 2016-10-01 MED ORDER — PROCHLORPERAZINE MALEATE 10 MG PO TABS: 10.0000 mg | ORAL_TABLET | Freq: Four times a day (QID) | ORAL | 0 refills | Status: DC | PRN

## 2016-10-01 MED ORDER — ONDANSETRON HCL 8 MG PO TABS: 8.0000 mg | ORAL_TABLET | Freq: Three times a day (TID) | ORAL | 2 refills | Status: AC | PRN

## 2016-10-01 MED ORDER — PROCHLORPERAZINE MALEATE 10 MG PO TABS: 10.0000 mg | ORAL_TABLET | Freq: Four times a day (QID) | ORAL | 2 refills | Status: AC | PRN

## 2016-10-01 MED ORDER — ONDANSETRON HCL 8 MG PO TABS: 8.0000 mg | ORAL_TABLET | Freq: Three times a day (TID) | ORAL | 0 refills | Status: DC | PRN

## 2016-10-01 NOTE — Progress Notes (Signed)
Chemotherapy teaching pulled together. 

## 2016-10-01 NOTE — Patient Instructions (Addendum)
[place]   CHEMOTHERAPY INSTRUCTIONS    Premeds:  Aloxi - high powered nausea/vomiting prevention medication used for chemotherapy patients. Emend - high powered nausea/vomiting prevention medication used for chemotherapy patients. Dexamethasone - steroid - given to reduce the risk of you having an allergic type reaction to the chemotherapy. Dex can cause you to feel energized, nervous/anxious/jittery, make you have trouble sleeping, and/or make you feel hot/flushed in the face/neck and/or look pink/red in the face/neck. These side effects will pass as the Dex wears off. (takes 20 minutes to infuse)  Cisplatin - this medication is hard on your kidneys. This is why we give you a large amount of fluids through your IV while you are here getting chemo. We also need you drinking 64 oz of fluid (preferably water/decaff fluids) 2 days prior to chemo and for up to 4-5 days after chemo. Drink more if you can. This will help keep your kidneys flushed. This can also cause acute and/or delayed nausea/vomiting. You must take your nausea meds as prescribed if you get nauseated. Do not wait until you start vomiting. This med can also cause peripheral neuropathy (numbness/tingling/burning in hands/fingers/feet/toes). Let us know if this develops so that we can monitor it and treat if necessary. (Takes 1 hour to infuse)  VP16 (Etoposide) - this medication can lower your blood pressure. We will monitor this during chemo. Bone marrow suppression (lowers white blood cells (fight infection), lowers red blood cells (make up your blood), lower platelets (help blood to clot). Hair loss, loss of appetite. (takes 1 hour to infuse)  Neulasta - this medication is not chemo but being given because you have had chemo. It is usually given 27 hours after the completion of chemotherapy. This medication works by boosting your bone marrow's supply of white blood cells. White blood cells are what  protect our bodies against infection. The medication is given in the form of a subcutaneous injection. It is given in the fatty tissue of your abdomen. It is a short needle. The major side effect of this medication is bone or muscle pain. The drug of choice to relieve or lessen the pain is Aleve or Ibuprofen. If a physician has ever told you not to take Aleve or Ibuprofen - then don't take it. You should then take Tylenol/acetaminophen. Take either medication as the bottle directs you to.  The level of pain you experience as a result of this injection can range from none, to mild or moderate, or severe. Please let us know if you develop moderate or severe bone pain.       POTENTIAL SIDE EFFECTS OF TREATMENT: Increased Susceptibility to Infection, Vomiting, Constipation, Hair Thinning, Changes in Character of Skin and Nails (brittleness, dryness,etc.), Bone Marrow Suppression, Abdominal Cramping, Complete Hair Loss, Nausea, Diarrhea, Sun Sensitivity and Mouth Sores   EDUCATIONAL MATERIALS GIVEN AND REVIEWED: Chemotherapy and You booklet Specific Instructions Sheets: Cisplatin, Etoposide, Neulasta, Aloxi, Emend, Dexamethasone, PICC line, port a cath, Zofran, Compazine, Ciarrhea sheet, Constipation Sheet, Nausea/Vomiting Sheet, What to Know During Chemo, What to Know After Chemo, Dr. Donald Pore side effect sheet   SELF CARE ACTIVITIES WHILE ON CHEMOTHERAPY: Increase your fluid intake 48 hours prior to treatment and drink at least 2 quarts per day after treatment., No alcohol intake., No aspirin or other medications unless approved by your oncologist., Eat foods that are light and easy to digest., Eat foods at cold or room temperature., No fried, fatty, or spicy foods immediately before or  after treatment., Have teeth cleaned professionally before starting treatment. Keep dentures and partial plates clean., Use soft toothbrush and do not use mouthwashes that contain alcohol. Biotene is a good mouthwash  that is available at most pharmacies or may be ordered by calling [phone]., Use warm salt water gargles (1 teaspoon salt per 1 quart warm water) before and after meals and at bedtime. Or you may rinse with 2 tablespoons of three -percent hydrogen peroxide mixed in eight ounces of water., Always use sunscreen with SPF (Sun Protection Factor) of 30 or higher., Use your nausea medication as directed to prevent nausea., Use your stool softener or laxative as directed to prevent constipation. and Use your anti-diarrheal medication as directed to stop diarrhea.   MEDICATIONS: You have been given prescriptions for the following medications:  Zofran '8mg'$  tablet. Take 1 tablet every 8 hours as needed for nausea/vomiting.   Compazine '10mg'$  tablet. Take 1 tablet every 6 hours as needed for nausea/vomiting.   EMLA cream. Apply a quarter size amount to port site 1 hour prior to chemo. Do not rub in. Cover with plastic wrap.  Over-the-Counter Meds:  Miralax 1 capful in 8 oz of fluid daily. May increase to two times a day if needed. This is a stool softener. If this doesn't work proceed you can add:  Senokot S  - start with 1 tablet two times a day and increase to 4 tablets two times a day if needed. (total of 8 tablets in a 24 hour period). This is a stimulant laxative.   Call us if this does not help your bowels move.   Imodium '2mg'$  capsule. Take 2 capsules after the 1st loose stool and then 1 capsule every 2 hours until you go a total of 12 hours without having a loose stool. Call the [facility] if loose stools continue. If diarrhea occurs @ bedtime, take 2 capsules @ bedtime. Then take 2 capsules every 4 hours until morning. Call [facility].    SYMPTOMS TO REPORT AS SOON AS POSSIBLE AFTER TREATMENT:  FEVER GREATER THAN 100.5 F  CHILLS WITH OR WITHOUT FEVER  NAUSEA AND VOMITING THAT IS NOT CONTROLLED WITH YOUR NAUSEA MEDICATION  UNUSUAL SHORTNESS OF BREATH  UNUSUAL BRUISING OR  BLEEDING  TENDERNESS IN MOUTH AND THROAT WITH OR WITHOUT PRESENCE OF ULCERS  URINARY PROBLEMS  BOWEL PROBLEMS  UNUSUAL RASH    Wear comfortable clothing and clothing appropriate for easy access to any Portacath or PICC line. Let us know if there is anything that we can do to make your therapy better!      I have been informed and understand all of the instructions given to me and have received a copy. I have been instructed to call the clinic [phone] or my family physician as soon as possible for continued medical care, if indicated. I do not have any more questions at this time but understand that I may call the [facility] or the Patient Navigator at [phone] during office hours should I have questions or need assistance in obtaining follow-up care.   Cisplatin (Generic Name)  Other Names: Platinol, platinum   About This Drug Cisplatin is a drug used to treat cancer. This drug is given in the vein (IV).  Possible Side Effects (More Common)  . This drug may affect how your kidneys work. Your kidney function will be checked as needed. . Electrolyte changes. Your blood will be checked for electrolyte changes as needed. . High-frequency hearing  loss may occur. You will get IV fluids before and during the Cisplatin infusion to help prevent this. You may also get ringing in the ears. . Bone marrow depression. This is a decrease in the number of white blood cells, red blood cells, and platelets. This may raise your risk of infection, make you tired and weak (fatigue), and raise your risk of bleeding. . Nausea and throwing up (vomiting). These symptoms may happen within a few hours after your treatment and may last for a few days to a week. Medicines are available to stop or lessen these side effects.  Possible Side Effects (Less Common) . Effects on the nerves are called peripheral neuropathy. You may feel numbness or pain in your hands and feet. It may be hard  for you to button your clothes, open jars, or walk as usual. The effect on the nerves may get worse with more doses of the drug. These effects get better in some people after the drug is stopped, but it does not get better in all people. [name] Blurred vision or other changes in eyesight. . Soreness of the mouth and throat. You may have red areas, white patches, or sores that hurt. . Hair loss. You may notice your hair getting thin. Some patients lose their hair. Your hair often grows back when treatment is done. Allergic Reactions Allergic reactions to this drug are rare, but may happen in some patients. Signs of allergic reactions to this drug may be a rash, fever, chills, feeling dizzy, trouble breathing, and/or feeling that your heart is beating in a fast or not normal way.  Treating Side Effects  . Drink 6-8 cups of fluids each day unless your doctor has told you to limit your fluid intake due to some other health problem. A cup is 8 ounces of fluid. If you throw up or have loose bowel movements you should drink more fluids so that you do not become dehydrated (lack water in the body due to losing too much fluid). . If you have numbness and tingling in your hands and feet, be careful when cooking, walking, and handling sharp objects and hot liquids. . Mouth care is very important. Your mouth care should consist of routine, gentle cleaning of your teeth or dentures and rinsing your mouth with a mixture of 1/2 teaspoon of salt in 8 ounces of water or  teaspoon of baking soda in 8 ounces of water. This should be done at least after each meal and at bedtime. . If you have mouth sores, avoid mouthwash that has alcohol. Also avoid alcohol and smoking because they can bother your mouth and throat.  Food and Drug Interactions There are no known interactions of Cisplatin with food. This drug may interact with other medicines. Tell your doctor and pharmacist about all the medicines and dietary  supplements (vitamins, minerals, herbs and others) that you are taking at this time. The safety and use of dietary supplements and alternative diets are often not known. Using these might affect your cancer or interfere with your treatment. Until more is known, you should not use dietary supplements or alternative diets without your cancer doctor's Help.  When to Call the Doctor  Call your doctor or nurse right away if you have any of these symptoms: . Rash or itching . Feeling dizzy or lightheaded . Wheezing or trouble breathing . Swelling of the face . Fever of 100.5 F (38 C) or above . Chills . Easy bleeding or bruising . Decreased  urine . Weight gain of 5 pounds in one week (fluid retention) . Nausea that stops you from eating or drinking . Throwing up more than 3 times a day  Call your doctor or nurse as soon as possible if you have these symptoms:  . Numbness, tingling, decreased feeling or weakness in fingers, toes, arms, or legs . Trouble walking or changes in the way you walk, feeling clumsy when buttoning clothes, opening jars, or other routine hand motions . Blurred vision or other changes in eyesight . Changes in hearing, ringing in the ears . Pain in your mouth or throat that makes it hard to eat or drink . Fatigue that interferes with your daily activities  Sexual Problems and Reproductive Concerns  . Infertility warning: Sexual problems and reproduction concerns may occur. In both men and women, this drug may affect your ability to have children. This cannot be determined before your treatment. Speak with your doctor or nurse if you plan to have children. Ask for information on sperm or egg banking. . In men, this drug may interfere with your ability to make sperm, but it should not change your ability to have sexual relations. . In women, menstrual bleeding may become irregular or stop while you are receiving this drug. Do not assume that you cannot become  pregnant if you do not have a menstrual period. . Women may experience signs of menopause like vaginal dryness, itching, and pain during sexual relations . Genetic counseling is available for you to talk about the effects of this drug therapy on future pregnancies. Also, a genetic counselor can look at the possible risk of problems in the unbornbaby due to this medicine  if an exposure happens during pregnancy. . Pregnancy warning: The drug may have harmful effects on the unborn child, so effective methods of birth control should be used during your cancer treatment.  Etoposide is a drug used to treat cancer. This drug is given in the vein.  Possible Side Effects (More Common) . Nausea and throwing up (vomiting). These symptoms may happen within one to six hours after getting this drug and may last up to 72 hours. Medicines are available to stop or lessen these side effects . Bone marrow depression. This is a decrease in the number of white blood cells, red blood cells, and platelets. This may raise your risk of infection, make you tired and weak (fatigue), and raise your risk of bleeding. . Decreased appetite (decreased hunger) . Hair loss. Most patients lost hair on their scalp and body. You may notice hair thinning five to seven days after getting this drug. Often hair loss is temporary; your hair should grow back when treatment is done. . Fatigue  Possible Side Effects (Less Common) . Loose bowel movements (diarrhea) that may last for several days . Soreness of the mouth and throat. You may have red areas, white patches, or sores that hurt. . Increased total bilirubin in your blood. This may mean that you have changes in your liver function. Your blood work will be checked by your doctor. . Effects on the nerves are called peripheral neuropathy. You may feel numbness, tingling, or pain in your hands and feet. It may be hard for you to button your clothes, open jars, or walk as  usual. The effect on the nerves may get worse with more doses of the drug. These effects get better in some people after the drug is stopped but it does not get better in all people. [name]  Rash . Blood pressure may be low while getting this drug in an IV.  Allergic Reactions  Etoposide (Generic Name)  Serious allergic reactions including anaphylaxis are rare. While you are getting this drug in your vein (IV), tell your nurse right away if you have any of these symptoms of an allergic reaction:  . trouble catching your breath . feeling like your tongue or throat are swelling . feeling your heart beat quickly or in a not normal way (palpitations) . feeling dizzy or lightheaded . flushing, itching, rash, and/or hives  Treating Side Effects . If you are getting this drug in an IV, tell your nurse knowright away if you are feeling lightheaded or dizzy. . Talk with your nurse about getting a wig before you lose your hair. Also, call the [facility] at [phone] to find out information about the "Look Good, Feel Better" program close to where you live. It is a free program where women getting chemotherapy can learn about wigs, turbans and scarves as well as makeup techniques and skin and nail care. . Drink 6-8 cups of fluids each day unless your doctor has told you to limit your fluid intake due to some other health problem. A cup is 8 ounces of fluid. If you throw up or have loose bowel movements, you should drink more fluids so that you do not become dehydrated (lack water in the body from losing too much fluid). . Mouth care is very important. Your mouth care should consist of regular, gentle cleaning of your teeth or dentures and rinsing your mouth with a mixture of  teaspoon of salt in 8 ounces of water or  teaspoon of sodium bicarbonate (baking soda) in 8 ounces of water. This should be done at least after every meal and at bedtime. . If you have mouth sores, avoid  mouthwash that contains alcohol. Also avoid alcohol and smoking because they can bother your mouth and throat. . If you get a rash do not put anything on it unless your doctor or nurse says you may. Keep the area around the rash clean and dry. Let your doctor know right away if you get a rash while on this medicine. . Ask your doctor or nurse about medicine that is available to help stop or lessen nausea or throwing up. . Be careful when cooking, walking, and handling sharp objects and hot liquids.  Food and Drug Interactions There are no known interactions of etoposide with food. This drug may interact with other medication. Tell your doctor and pharmacist about all the medication and dietary supplements (vitamins, minerals, herbs and others) that you are taking at this time. The safety and effectiveness of dietary supplements and alternative diets are often unknown. Using these might affect your cancer or interfere with your treatment. Until more is known, you should not use dietary supplements or alternative diets without your cancer doctor's help.  When to Call the Doctor Call your doctor or nurse right away if you have any of these symptoms: . Trouble breathing or feeling short of breath . Fever of 100.5 F (38 C) or higher . Chills . Easy bleeding or bruising . Rash or itching . Feeling dizzy or lightheaded . Feeling that your heart is beating in a fast or not normal way (palpitations) . Loose bowel movements (diarrhea) more than 4 times a day or diarrhea with weakness or feeling lightheaded . Feeling confused . Nausea that stops you from eating or drinking . Throwing up more  than 3 times a day Call your doctor or nurse as soon as possible if you have any of these symptoms: . Numbness, tingling, decreased feeling or weakness in fingers, toes, arms, or legs . Trouble walking or changes in the way you walk . Pain in your mouth or throat that makes it hard to eat or drink  Sexual  Problems and Reproductive Concerns . Infertility warning: Sexual problems and reproduction concerns may happen. In both men and women, this drug may affect your ability to have children. This cannot be determined before your treatment. Talk with your doctor or nurse if you plan to have children. Ask for information on sperm or egg banking. . In men, this drug may interfere with your ability to make sperm, but it should not change your ability to have sexual relations.  In women, menstrual bleeding may become irregular or stop while you are getting this drug. Do not assume that you cannot become pregnant if you do not have a menstrual period. . Women may go through signs of menopause (change of life) like vaginal dryness or itching. Vaginal lubricants can be used to lessen vaginal dryness, itching, and pain during sexual relations. . Genetic counseling is available for you to talk about the effects of this drug therapy on future pregnancies. Also, a genetic counselor can look at the possible risk of problems in the unborn baby due to this medicine if an exposure happens during pregnancy.  . Pregnancy warning: This drug may have harmful effects on the unborn child, so effective methods of birth control should be used during your cancer treatment. Ask your doctor or nurse about effective methods of birth control.

## 2016-10-01 NOTE — Telephone Encounter (Signed)
Spoke with Johny Sax with [facility] [employer].  After giving her the patient information and plan of treatment she states that there is no prior authorization needed.  She states that there is no reference #  for this call but you can call [facility] and ask for Johny Sax. 9:47 am EST documentation.

## 2016-10-02 ENCOUNTER — Ambulatory Visit (HOSPITAL_COMMUNITY)
Admission: RE | Admit: 2016-10-02 | Discharge: 2016-10-02 | Disposition: A | Discharge status: Home / Self Care | Payer: Medicare Other | Source: Ambulatory Visit | Attending: Hematology & Oncology | Admitting: Hematology & Oncology

## 2016-10-02 ENCOUNTER — Encounter (HOSPITAL_BASED_OUTPATIENT_CLINIC_OR_DEPARTMENT_OTHER): Payer: Medicare Other

## 2016-10-02 ENCOUNTER — Encounter (HOSPITAL_COMMUNITY): Payer: Self-pay | Admitting: Emergency Medicine

## 2016-10-02 VITALS — BP 92/57 | HR 69 | Temp 97.8°F | Resp 18 | Wt 183.4 lb

## 2016-10-02 DIAGNOSIS — Z9889 Other specified postprocedural states: Secondary | ICD-10-CM | POA: Diagnosis present

## 2016-10-02 DIAGNOSIS — Z79899 Other long term (current) drug therapy: Secondary | ICD-10-CM | POA: Diagnosis present

## 2016-10-02 DIAGNOSIS — Z5111 Encounter for antineoplastic chemotherapy: Secondary | ICD-10-CM | POA: Diagnosis not present

## 2016-10-02 DIAGNOSIS — C3492 Malignant neoplasm of unspecified part of left bronchus or lung: Secondary | ICD-10-CM | POA: Diagnosis not present

## 2016-10-02 LAB — CBC WITH DIFFERENTIAL/PLATELET
BASOS ABS: 0.1 10*3/uL (ref 0.0–0.1)
Basophils Relative: 1 %
Eosinophils Absolute: 0.2 10*3/uL (ref 0.0–0.7)
Eosinophils Relative: 4 %
HCT: 43.3 % (ref 39.0–52.0)
Hemoglobin: 14.9 g/dL (ref 13.0–17.0)
LYMPHS ABS: 1.7 10*3/uL (ref 0.7–4.0)
Lymphocytes Relative: 31 %
MCH: 27.6 pg (ref 26.0–34.0)
MCHC: 34.4 g/dL (ref 30.0–36.0)
MCV: 80.3 fL (ref 78.0–100.0)
MONO ABS: 2 10*3/uL — AB (ref 0.1–1.0)
Monocytes Relative: 36 %
NEUTROS ABS: 1.5 10*3/uL — AB (ref 1.7–7.7)
Neutrophils Relative %: 28 %
PLATELETS: 354 10*3/uL (ref 150–400)
RBC: 5.39 MIL/uL (ref 4.22–5.81)
RDW: 15.1 % (ref 11.5–15.5)
WBC: 5.5 10*3/uL (ref 4.0–10.5)

## 2016-10-02 LAB — COMPREHENSIVE METABOLIC PANEL
ALT: 8 U/L — ABNORMAL LOW (ref 17–63)
ANION GAP: 9 (ref 5–15)
AST: 20 U/L (ref 15–41)
Albumin: 3.3 g/dL — ABNORMAL LOW (ref 3.5–5.0)
Alkaline Phosphatase: 75 U/L (ref 38–126)
BUN: 15 mg/dL (ref 6–20)
CHLORIDE: 98 mmol/L — AB (ref 101–111)
CO2: 24 mmol/L (ref 22–32)
Calcium: 9.2 mg/dL (ref 8.9–10.3)
Creatinine, Ser: 1.63 mg/dL — ABNORMAL HIGH (ref 0.61–1.24)
GFR calc Af Amer: 49 mL/min — ABNORMAL LOW (ref >60)
GFR, EST NON AFRICAN AMERICAN: 43 mL/min — AB (ref >60)
Glucose, Bld: 101 mg/dL — ABNORMAL HIGH (ref 65–99)
POTASSIUM: 4.1 mmol/L (ref 3.5–5.1)
Sodium: 131 mmol/L — ABNORMAL LOW (ref 135–145)
TOTAL PROTEIN: 7.9 g/dL (ref 6.5–8.1)
Total Bilirubin: 0.5 mg/dL (ref 0.3–1.2)

## 2016-10-02 LAB — MAGNESIUM: MAGNESIUM: 1.7 mg/dL (ref 1.7–2.4)

## 2016-10-02 MED ORDER — ETOPOSIDE CHEMO INJECTION 1 GM/50ML
100.0000 mg/m2 | Freq: Once | INTRAVENOUS | Status: AC
  Administered 2016-10-02: 200 mg via INTRAVENOUS
  Filled 2016-10-02: qty 10

## 2016-10-02 MED ORDER — SODIUM CHLORIDE 0.9 % IV SOLN
Freq: Once | INTRAVENOUS | Status: AC
  Administered 2016-10-02: 13:00:00 via INTRAVENOUS

## 2016-10-02 MED ORDER — PALONOSETRON HCL INJECTION 0.25 MG/5ML
0.2500 mg | Freq: Once | INTRAVENOUS | Status: AC
  Administered 2016-10-02: 0.25 mg via INTRAVENOUS
  Filled 2016-10-02: qty 5

## 2016-10-02 MED ORDER — HEPARIN SOD (PORK) LOCK FLUSH 100 UNIT/ML IV SOLN
500.0000 [IU] | Freq: Once | INTRAVENOUS | Status: AC | PRN
  Administered 2016-10-02: 500 [IU]
  Filled 2016-10-02: qty 5

## 2016-10-02 MED ORDER — SODIUM CHLORIDE 0.9 % IV SOLN
Freq: Once | INTRAVENOUS | Status: AC
  Administered 2016-10-02: 13:00:00 via INTRAVENOUS
  Filled 2016-10-02: qty 5

## 2016-10-02 NOTE — Discharge Instructions (Signed)
PICC Insertion, Care After Refer to this sheet in the next few weeks. These instructions provide you with information on caring for yourself after your procedure. Your health care provider may also give you more specific instructions. Your treatment has been planned according to current medical practices, but problems sometimes occur. Call your health care provider if you have any problems or questions after your procedure. What can I expect after the procedure? After your procedure, it is typical to have the following:  Mild discomfort at the insertion site. This should not last more than a day. Follow these instructions at home:  Rest at home for the remainder of the day after the procedure.  You may bend your arm and move it freely. If your PICC is near or at the bend of your elbow, avoid activity with repeated motion at the elbow.  Avoid lifting heavy objects as instructed by your health care provider.  Avoid using a crutch with the arm on the same side as your PICC. You may need to use a walker. Bandage Care  Keep your PICC bandage (dressing) clean and dry to prevent infection.  Ask your health care provider when you may shower. To keep the dressing dry, cover the PICC with plastic wrap and tape before showering. If the dressing does become wet, replace it right after the shower.  Do not soak in the bath, swim, or use hot tubs when you have a PICC.  Change the PICC dressing as instructed by your health care provider.  Change your PICC dressing if it becomes loose or wet. General PICC Care  Check the PICC insertion site daily for leakage, redness, swelling, or pain.  Flush the PICC as directed by your health care provider. Let your health care provider know right away if the PICC is difficult to flush or does not flush. Do not use force to flush the PICC.  Do not use a syringe that is less than 10 mL to flush the PICC.  Never pull or tug on the PICC.  Avoid blood pressure  checks on the arm with the PICC.  Keep your PICC identification card with you at all times.  Do not take the PICC out yourself. Only a trained health care professional should remove the PICC. Contact a health care provider if:  You have pain in your arm, ear, face, or teeth.  You have fever or chills.  You have drainage from the PICC insertion site.  You have redness or palpate a "cord" around the PICC insertion site.  You cannot flush the catheter. Get help right away if:  You have swelling in the arm in which the PICC is inserted. This information is not intended to replace advice given to you by your health care provider. Make sure you discuss any questions you have with your health care provider. Document Released: 06/22/2013 Document Revised: 02/07/2016 Document Reviewed: 06/24/2013 Elsevier Interactive Patient Education  2017 [place] A peripherally inserted central catheter (PICC) is a long, thin, flexible tube that is inserted into a vein in the upper arm. It is a form of intravenous (IV) access. It is considered to be a "central" line because the tip of the PICC ends in a large vein in your chest. This large vein is called the superior vena cava (SVC). The PICC tip ends in the SVC because there is a lot of blood flow in the SVC. This allows medicines and IV fluids to be quickly distributed throughout the  body. The PICC is inserted using a sterile technique by a specially trained nurse or physician. After the PICC is inserted, a chest X-ray exam is done to be sure it is in the correct place. A PICC may be placed for different reasons, such as:  To give medicines and liquid nutrition that can only be given through a central line. Examples are:  Certain antibiotic treatments.  Chemotherapy.  Total parenteral nutrition (TPN).  To take frequent blood samples.  To give IV fluids and blood products.  If there is difficulty placing a peripheral intravenous  (PIV) catheter. If taken care of properly, a PICC can remain in place for several months. A PICC can also allow a person to go home from the hospital early. Medicine and PICC care can be managed at home by a family member or home health care team. What problems can happen when I have a PICC? Problems with a PICC can occasionally occur. These may include the following:  A blood clot (thrombus) forming in or at the tip of the PICC. This can cause the PICC to become clogged. A clot-dissolving medicine called tissue plasminogen activator (tPA) can be given through the PICC to help break up the clot.  Inflammation of the vein (phlebitis) in which the PICC is placed. Signs of inflammation may include redness, pain at the insertion site, red streaks, or being able to feel a "cord" in the vein where the PICC is located.  Infection in the PICC or at the insertion site. Signs of infection may include fever, chills, redness, swelling, or pus drainage from the PICC insertion site.  PICC movement (malposition). The PICC tip may move from its original position due to excessive physical activity, forceful coughing, sneezing, or vomiting.  A break or cut in the PICC. It is important to not use scissors near the PICC.  Nerve or tendon irritation or injury during PICC insertion. What should I keep in mind about activities when I have a PICC?  You may bend your arm and move it freely. If your PICC is near or at the bend of your elbow, avoid activity with repeated motion at the elbow.  Rest at home for the remainder of the day following PICC line insertion.  Avoid lifting heavy objects as instructed by your health care provider.  Avoid using a crutch with the arm on the same side as your PICC. You may need to use a walker. What should I know about my PICC dressing?  Keep your PICC bandage (dressing) clean and dry to prevent infection.  Ask your health care provider when you may shower. Ask your health care  provider to teach you how to wrap the PICC when you do take a shower.  Change the PICC dressing as instructed by your health care provider.  Change your PICC dressing if it becomes loose or wet. What should I know about PICC care?  Check the PICC insertion site daily for leakage, redness, swelling, or pain.  Do not take a bath, swim, or use hot tubs when you have a PICC. Cover PICC line with clear plastic wrap and tape to keep it dry while showering.  Flush the PICC as directed by your health care provider. Let your health care provider know right away if the PICC is difficult to flush or does not flush. Do not use force to flush the PICC.  Do not use a syringe that is less than 10 mL to flush the PICC.  Never pull  or tug on the PICC.  Avoid blood pressure checks on the arm with the PICC.  Keep your PICC identification card with you at all times.  Do not take the PICC out yourself. Only a trained clinical professional should remove the PICC. Get help right away if:  Your PICC is accidentally pulled all the way out. If this happens, cover the insertion site with a bandage or gauze dressing. Do not throw the PICC away. Your health care provider will need to inspect it.  Your PICC was tugged or pulled and has partially come out. Do not  push the PICC back in.  There is any type of drainage, redness, or swelling where the PICC enters the skin.  You cannot flush the PICC, it is difficult to flush, or the PICC leaks around the insertion site when it is flushed.  You hear a "flushing" sound when the PICC is flushed.  You have pain, discomfort, or numbness in your arm, shoulder, or jaw on the same side as the PICC.  You feel your heart "racing" or skipping beats.  You notice a hole or tear in the PICC.  You develop chills or a fever. This information is not intended to replace advice given to you by your health care provider. Make sure you discuss any questions you have with your  health care provider. Document Released: 03/08/2003 Document Revised: 03/21/2016 Document Reviewed: 06/24/2013 Elsevier Interactive Patient Education  2017 [place] Catheter/Midline Placement  The IV Nurse has discussed with the patient and/or persons authorized to consent for the patient, the purpose of this procedure and the potential benefits and risks involved with this procedure.  The benefits include less needle sticks, lab draws from the catheter, and the patient may be discharged home with the catheter. Risks include, but not limited to, infection, bleeding, blood clot (thrombus formation), and puncture of an artery; nerve damage and irregular heartbeat and possibility to perform a PICC exchange if needed/ordered by physician.  Alternatives to this procedure were also discussed.  [employer] PICC patient education guide, fact sheet on infection prevention and patient information card has been provided to patient /or left at bedside.    PICC/Midline Placement Documentation  PICC Single Lumen 60/04/59 PICC Right Basilic 40 cm 1 cm (Active)  Indication for Insertion or Continuance of Line Prolonged intravenous therapies 10/02/2016 10:56 AM  Exposed Catheter (cm) 1 cm 10/02/2016 10:56 AM  Site Assessment Clean;Dry;Intact 10/02/2016 10:56 AM  Line Status Blood return noted 10/02/2016 10:56 AM  Dressing Type Transparent;Securing device 10/02/2016 10:56 AM  Dressing Status Clean;Dry;Intact;Antimicrobial disc in place 10/02/2016 10:56 AM  Line Care Connections checked and tightened 10/02/2016 10:56 AM  Dressing Intervention New dressing 10/02/2016 10:56 AM       Charm Barges S 10/02/2016, 11:05 AM

## 2016-10-02 NOTE — Progress Notes (Signed)
Chemotherapy teaching completed, consent signed for VP-16 and cisplatin.  Extensive teaching packet given.

## 2016-10-02 NOTE — Progress Notes (Signed)
Tolerated chemo well. Stable on discharge home via wheelchair with wife.

## 2016-10-02 NOTE — Patient Instructions (Signed)
[facility] Discharge Instructions for Patients Receiving Chemotherapy   Beginning January 23rd 2017 lab work for the [facility] will be done in the  Main lab at [facility] on 1st floor. If you have a lab appointment with the [facility] please come in thru the  Main Entrance and check in at the main information desk   Today you received the following chemotherapy agents etoposide Cycle 1 Day 1. Return for chemo tomorrow as scheduled.  To help prevent nausea and vomiting after your treatment, we encourage you to take your nausea medication as instructed.   If you develop nausea and vomiting, or diarrhea that is not controlled by your medication, call the clinic.  The clinic phone number is [phone]. Office hours are Monday-Friday 8:30am-5:00pm.  BELOW ARE SYMPTOMS THAT SHOULD BE REPORTED IMMEDIATELY:  *FEVER GREATER THAN 101.0 F  *CHILLS WITH OR WITHOUT FEVER  NAUSEA AND VOMITING THAT IS NOT CONTROLLED WITH YOUR NAUSEA MEDICATION  *UNUSUAL SHORTNESS OF BREATH  *UNUSUAL BRUISING OR BLEEDING  TENDERNESS IN MOUTH AND THROAT WITH OR WITHOUT PRESENCE OF ULCERS  *URINARY PROBLEMS  *BOWEL PROBLEMS  UNUSUAL RASH Items with * indicate a potential emergency and should be followed up as soon as possible. If you have an emergency after office hours please contact your primary care physician or go to the nearest emergency department.  Please call the clinic during office hours if you have any questions or concerns.   You may also contact the Patient Navigator at [phone] should you have any questions or need assistance in obtaining follow up care.      Resources For Cancer Patients and their Caregivers ? American Cancer Society: Can assist with transportation, wigs, general needs, runs Look Good Feel Better.        418-378-4239 ? Cancer Care: Provides financial assistance, online support groups, medication/co-pay assistance.  1-800-813-HOPE  281-121-6012) ? [place] Assists [place] cancer patients and their families through emotional , educational and financial support.  616-305-0319 ? [place] DSS Where to apply for food stamps, Medicaid and utility assistance. 607-083-6142 ? [employer]: Transportation to medical appointments. 312-812-1082 ? Social Security Administration: May apply for disability if have a Stage IV cancer. [phone] 5194829758 ? [employer], Disability and Transit Services: Assists with nutrition, care and transit needs. [phone]

## 2016-10-03 ENCOUNTER — Encounter (HOSPITAL_BASED_OUTPATIENT_CLINIC_OR_DEPARTMENT_OTHER): Payer: Medicare Other

## 2016-10-03 VITALS — BP 98/59 | HR 73 | Temp 98.0°F | Resp 18 | Wt 180.6 lb

## 2016-10-03 DIAGNOSIS — Z5111 Encounter for antineoplastic chemotherapy: Secondary | ICD-10-CM

## 2016-10-03 DIAGNOSIS — C3492 Malignant neoplasm of unspecified part of left bronchus or lung: Secondary | ICD-10-CM | POA: Diagnosis not present

## 2016-10-03 MED ORDER — SODIUM CHLORIDE 0.9 % IV SOLN: 10.0000 mg | Freq: Once | INTRAVENOUS | Status: DC

## 2016-10-03 MED ORDER — SODIUM CHLORIDE 0.9% FLUSH: 10.0000 mL | INTRAVENOUS | Status: DC | PRN

## 2016-10-03 MED ORDER — POTASSIUM CHLORIDE 2 MEQ/ML IV SOLN
Freq: Once | INTRAVENOUS | Status: AC
  Administered 2016-10-03: 10:00:00 via INTRAVENOUS
  Filled 2016-10-03: qty 10

## 2016-10-03 MED ORDER — HEPARIN SOD (PORK) LOCK FLUSH 100 UNIT/ML IV SOLN
250.0000 [IU] | Freq: Once | INTRAVENOUS | Status: AC | PRN
  Administered 2016-10-03: 250 [IU]
  Filled 2016-10-03 (×2): qty 5

## 2016-10-03 MED ORDER — SODIUM CHLORIDE 0.9 % IV SOLN
60.0000 mg/m2 | Freq: Once | INTRAVENOUS | Status: AC
  Administered 2016-10-03: 121 mg via INTRAVENOUS
  Filled 2016-10-03: qty 121

## 2016-10-03 MED ORDER — DEXAMETHASONE SODIUM PHOSPHATE 10 MG/ML IJ SOLN
10.0000 mg | Freq: Once | INTRAMUSCULAR | Status: AC
  Administered 2016-10-03: 10 mg via INTRAVENOUS
  Filled 2016-10-03: qty 1

## 2016-10-03 MED ORDER — SODIUM CHLORIDE 0.9 % IV SOLN
100.0000 mg/m2 | Freq: Once | INTRAVENOUS | Status: AC
  Administered 2016-10-03: 200 mg via INTRAVENOUS
  Filled 2016-10-03: qty 10

## 2016-10-03 NOTE — Progress Notes (Signed)
Tolerated tx w/o adverse reaction.  Alert, in no distress.  VSS.  Discharged ambulatory in c/o spouse.  

## 2016-10-03 NOTE — Patient Instructions (Signed)
[facility] Discharge Instructions for Patients Receiving Chemotherapy   Beginning January 23rd 2017 lab work for the [facility] will be done in the  Main lab at [facility] on 1st floor. If you have a lab appointment with the [facility] please come in thru the  Main Entrance and check in at the main information desk   Today you received the following chemotherapy agents:  Cisplatin and etoposide  If you develop nausea and vomiting, or diarrhea that is not controlled by your medication, call the clinic.  The clinic phone number is [phone]. Office hours are Monday-Friday 8:30am-5:00pm.  BELOW ARE SYMPTOMS THAT SHOULD BE REPORTED IMMEDIATELY:  *FEVER GREATER THAN 101.0 F  *CHILLS WITH OR WITHOUT FEVER  NAUSEA AND VOMITING THAT IS NOT CONTROLLED WITH YOUR NAUSEA MEDICATION  *UNUSUAL SHORTNESS OF BREATH  *UNUSUAL BRUISING OR BLEEDING  TENDERNESS IN MOUTH AND THROAT WITH OR WITHOUT PRESENCE OF ULCERS  *URINARY PROBLEMS  *BOWEL PROBLEMS  UNUSUAL RASH Items with * indicate a potential emergency and should be followed up as soon as possible. If you have an emergency after office hours please contact your primary care physician or go to the nearest emergency department.  Please call the clinic during office hours if you have any questions or concerns.   You may also contact the Patient Navigator at [phone] should you have any questions or need assistance in obtaining follow up care.      Resources For Cancer Patients and their Caregivers ? American Cancer Society: Can assist with transportation, wigs, general needs, runs Look Good Feel Better.        364-390-2395 ? Cancer Care: Provides financial assistance, online support groups, medication/co-pay assistance.  1-800-813-HOPE (458)289-2824) ? [place] Assists [place] cancer patients and their families through emotional , educational and financial support.   (364) 279-3851 ? [place] DSS Where to apply for food stamps, Medicaid and utility assistance. 216-286-0757 ? [employer]: Transportation to medical appointments. 267-869-7579 ? Social Security Administration: May apply for disability if have a Stage IV cancer. 785-857-0702 712-180-3721 ? [employer], Disability and Transit Services: Assists with nutrition, care and transit needs. 9405187522

## 2016-10-03 NOTE — Treatment Plan (Signed)
Dose reduced by 25 % due to serum creatinine 1.63

## 2016-10-06 ENCOUNTER — Encounter (HOSPITAL_BASED_OUTPATIENT_CLINIC_OR_DEPARTMENT_OTHER): Payer: Medicare Other

## 2016-10-06 ENCOUNTER — Encounter: Payer: Self-pay | Admitting: Cardiovascular Disease

## 2016-10-06 ENCOUNTER — Ambulatory Visit (INDEPENDENT_AMBULATORY_CARE_PROVIDER_SITE_OTHER): Payer: Medicare Other | Admitting: Cardiovascular Disease

## 2016-10-06 VITALS — BP 110/80 | HR 72 | Temp 97.7°F | Resp 18 | Wt 182.0 lb

## 2016-10-06 VITALS — BP 109/72 | HR 69 | Ht 69.0 in | Wt 182.2 lb

## 2016-10-06 DIAGNOSIS — I4819 Other persistent atrial fibrillation: Secondary | ICD-10-CM

## 2016-10-06 DIAGNOSIS — I10 Essential (primary) hypertension: Secondary | ICD-10-CM | POA: Diagnosis not present

## 2016-10-06 DIAGNOSIS — I2510 Atherosclerotic heart disease of native coronary artery without angina pectoris: Secondary | ICD-10-CM

## 2016-10-06 DIAGNOSIS — I472 Ventricular tachycardia, unspecified: Secondary | ICD-10-CM

## 2016-10-06 DIAGNOSIS — I519 Heart disease, unspecified: Secondary | ICD-10-CM

## 2016-10-06 DIAGNOSIS — I6523 Occlusion and stenosis of bilateral carotid arteries: Secondary | ICD-10-CM

## 2016-10-06 DIAGNOSIS — C3492 Malignant neoplasm of unspecified part of left bronchus or lung: Secondary | ICD-10-CM

## 2016-10-06 DIAGNOSIS — Z95810 Presence of automatic (implantable) cardiac defibrillator: Secondary | ICD-10-CM

## 2016-10-06 DIAGNOSIS — E782 Mixed hyperlipidemia: Secondary | ICD-10-CM

## 2016-10-06 DIAGNOSIS — Z5111 Encounter for antineoplastic chemotherapy: Secondary | ICD-10-CM | POA: Diagnosis not present

## 2016-10-06 DIAGNOSIS — I5022 Chronic systolic (congestive) heart failure: Secondary | ICD-10-CM

## 2016-10-06 DIAGNOSIS — Z5189 Encounter for other specified aftercare: Secondary | ICD-10-CM | POA: Diagnosis not present

## 2016-10-06 DIAGNOSIS — I481 Persistent atrial fibrillation: Secondary | ICD-10-CM

## 2016-10-06 MED ORDER — ETOPOSIDE CHEMO INJECTION 1 GM/50ML
100.0000 mg/m2 | Freq: Once | INTRAVENOUS | Status: AC
  Administered 2016-10-06: 200 mg via INTRAVENOUS
  Filled 2016-10-06: qty 10

## 2016-10-06 MED ORDER — SODIUM CHLORIDE 0.9 % IV SOLN: 10.0000 mg | Freq: Once | INTRAVENOUS | Status: DC

## 2016-10-06 MED ORDER — SODIUM CHLORIDE 0.9 % IV SOLN
Freq: Once | INTRAVENOUS | Status: AC
  Administered 2016-10-06: 13:00:00 via INTRAVENOUS

## 2016-10-06 MED ORDER — SODIUM CHLORIDE 0.9% FLUSH
10.0000 mL | INTRAVENOUS | Status: DC | PRN
  Administered 2016-10-06: 10 mL
  Filled 2016-10-06: qty 10

## 2016-10-06 MED ORDER — DEXAMETHASONE SODIUM PHOSPHATE 10 MG/ML IJ SOLN
10.0000 mg | Freq: Once | INTRAMUSCULAR | Status: AC
  Administered 2016-10-06: 10 mg via INTRAVENOUS
  Filled 2016-10-06: qty 1

## 2016-10-06 MED ORDER — HEPARIN SOD (PORK) LOCK FLUSH 100 UNIT/ML IV SOLN
500.0000 [IU] | Freq: Once | INTRAVENOUS | Status: AC | PRN
  Administered 2016-10-06: 500 [IU]
  Filled 2016-10-06: qty 5

## 2016-10-06 MED ORDER — PEGFILGRASTIM 6 MG/0.6ML ~~LOC~~ PSKT
6.0000 mg | PREFILLED_SYRINGE | Freq: Once | SUBCUTANEOUS | Status: AC
  Administered 2016-10-06: 6 mg via SUBCUTANEOUS
  Filled 2016-10-06: qty 0.6

## 2016-10-06 NOTE — Progress Notes (Signed)
Patient presented today for chemotherapy. Patient states he has had multple episodes of diarrhea, patient stated that he has not taken anything. Gave diarrhea sheet and instructed him to follow that and to let us know if it does not get better.MD notified and no new orders.    Chemotherapy given today. Marland KitchenEmilia Beck arrived today for [facility] neulasta on body injector. See MAR for administration details. Injector in place and engaged with green light indicator on flashing. Tolerated application with out problems.Patient tolerated it well. No issues. Vitals stable  Follow up as scheduled.

## 2016-10-06 NOTE — Patient Instructions (Signed)

## 2016-10-06 NOTE — Patient Instructions (Signed)
[facility] Discharge Instructions for Patients Receiving Chemotherapy   Beginning January 23rd 2017 lab work for the Cancer Center will be done in the  Main lab at [place] [facility] on 1st floor. If you have a lab appointment with the Cancer Center please come in thru the  Main Entrance and check in at the main information desk   Today you received the following chemotherapy agents   To help prevent nausea and vomiting after your treatment, we encourage you to take your nausea medication     If you develop nausea and vomiting, or diarrhea that is not controlled by your medication, call the clinic.  The clinic phone number is [phone]. Office hours are Monday-Friday 8:30am-5:00pm.  BELOW ARE SYMPTOMS THAT SHOULD BE REPORTED IMMEDIATELY:  *FEVER GREATER THAN 101.0 F  *CHILLS WITH OR WITHOUT FEVER  NAUSEA AND VOMITING THAT IS NOT CONTROLLED WITH YOUR NAUSEA MEDICATION  *UNUSUAL SHORTNESS OF BREATH  *UNUSUAL BRUISING OR BLEEDING  TENDERNESS IN MOUTH AND THROAT WITH OR WITHOUT PRESENCE OF ULCERS  *URINARY PROBLEMS  *BOWEL PROBLEMS  UNUSUAL RASH Items with * indicate a potential emergency and should be followed up as soon as possible. If you have an emergency after office hours please contact your primary care physician or go to the nearest emergency department.  Please call the clinic during office hours if you have any questions or concerns.   You may also contact the Patient Navigator at [phone] should you have any questions or need assistance in obtaining follow up care.      Resources For Cancer Patients and their Caregivers ? American Cancer Society: Can assist with transportation, wigs, general needs, runs Look Good Feel Better.        1-888-227-6333 ? Cancer Care: Provides financial assistance, online support groups, medication/co-pay assistance.  1-800-813-HOPE (4673) ? [facility] Assists [place] cancer  patients and their families through emotional , educational and financial support.  336-427-4357 ? [place] DSS Where to apply for food stamps, Medicaid and utility assistance. 336-342-1394 ? [employer]: Transportation to medical appointments. 336-347-2287 ? Social Security Administration: May apply for disability if have a Stage IV cancer. 336-342-7796 1-800-772-1213 ? [place] [employer], Disability and Transit Services: Assists with nutrition, care and transit needs. [phone]         

## 2016-10-06 NOTE — Progress Notes (Signed)
SUBJECTIVE: [name] returns for follow-up. He has since been diagnosed with lung cancer and is receiving chemotherapy.  He has coronary artery disease and chronic systolic heart failure.  He has felt weak and dehydrated after starting chemotherapy. He denies exertional chest pain. Toprol-XL and losartan have been discontinued since his last visit with me in April 2017.  We talked about [employer] basketball.   Review of Systems: As per "subjective", otherwise negative.  Allergies  Allergen Reactions  . Lisinopril Anaphylaxis and Swelling    angioedema  . Naproxen Hives  . Prednisone Nausea And Vomiting and Other (See Comments)    "I felt like my stomach was in knots, very painful"    Current Outpatient Prescriptions  Medication Sig Dispense Refill  . albuterol (PROVENTIL HFA;VENTOLIN HFA) 108 (90 Base) MCG/ACT inhaler Inhale 2 puffs into the lungs every 6 (six) hours as needed for wheezing or shortness of breath. 1 Inhaler 1  . allopurinol (ZYLOPRIM) 300 MG tablet Take 300 mg by mouth daily.  4  . ALPRAZolam (XANAX) 0.5 MG tablet Take 0.5 mg by mouth 2 (two) times daily as needed for anxiety.    [name] CISPLATIN IV Inject into the vein. Day 1 every 21 days    . colchicine 0.6 MG tablet Take 0.6 mg by mouth daily.    . ETOPOSIDE IV Inject into the vein. Days 1-3 every 21 days    . furosemide (LASIX) 20 MG tablet Take 1 tablet (20 mg total) by mouth daily. 90 tablet 3  . ipratropium (ATROVENT) 0.02 % nebulizer solution Take 0.5 mg by nebulization every 6 (six) hours as needed for wheezing or shortness of breath.    . isosorbide mononitrate (IMDUR) 30 MG 24 hr tablet Take 1 tablet (30 mg total) by mouth daily. 90 tablet 3  . NITROSTAT 0.4 MG SL tablet PLACE 1 TABLET UNDER TONGUE EVERY 5 MINUTES AS NEEDED FOR CHEST PAIN (UP TO 3 DOSES) 25 tablet 3  . omeprazole (PRILOSEC) 20 MG capsule Take 20 mg by mouth daily.  2  . ondansetron (ZOFRAN) 8 MG tablet Take 1 tablet (8 mg total) by  mouth every 8 (eight) hours as needed for nausea or vomiting. 30 tablet 2  . Pegfilgrastim (NEULASTA ONPRO [place]) Inject into the skin. Every 21 days - to be administered 27 hours post chemo completion    . potassium chloride (K-DUR) 10 MEQ tablet Take 10 mEq by mouth 2 (two) times daily.    . prochlorperazine (COMPAZINE) 10 MG tablet Take 1 tablet (10 mg total) by mouth every 6 (six) hours as needed for nausea or vomiting. 30 tablet 2  . simvastatin (ZOCOR) 10 MG tablet Take 1 tablet (10 mg total) by mouth at bedtime. 90 tablet 3  . sotalol (BETAPACE) 120 MG tablet Take 120 mg by mouth 2 (two) times daily.  4  . ULORIC 40 MG tablet Take 1 tablet by mouth daily.  3  . warfarin (COUMADIN) 5 MG tablet Take 1 tablet (5 mg total) by mouth daily. 30 tablet 6   No current facility-administered medications for this visit.     Past Medical History:  Diagnosis Date  . Arthritis    gout  . Asthma   . Atrial fibrillation ([place])   . CAD (coronary artery disease)    a. ant MI 1998 with PCI to LAD. b. Ant-lat MI 04/2012 tx with overlapping DES to LAD.  [name] CHF (congestive heart failure) ([place])   . CRI (  chronic renal insufficiency)   . CVA (cerebral infarction) 11/2010  . DVT (deep venous thrombosis) ([place])   . Esophageal reflux   . Gout   . Heart murmur   . Hypercholesteremia   . ICD (implantable cardiac defibrillator) in place   . Impotence of organic origin   . Ischemic cardiomyopathy    EF 25-30% by echo 04/2012; [facility]  . Lung disease, interstitial ([place])   . Myocardial infarction 1998 and 2005  . Peripheral arterial disease ([place])   . Pneumonia    hx  . 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 Right internal carotid occlusion   . Stroke [facility]) ? 2006  . Systolic CHF (H[facility])    NYHA Class II/III  . Tobacco use disorder   . Unspecified essential hypertension     Past Surgical History:  Procedure Laterality Date  . ANTERIOR CERVICAL DECOMP/DISCECTOMY FUSION  2007   "put in 4  screws to hold my head on; ruptured C5; job related injury" (08/24/2012)  . CARDIAC DEFIBRILLATOR PLACEMENT  2011   [employer]  he is a MADIT RIT study patient  . CORONARY ANGIOPLASTY WITH STENT PLACEMENT  05/1997; 04/2012   "1 + 2; total of 3" (08/24/2012)  . EMBOLECTOMY Right 06/24/2014   Procedure: RIGHT FEMORAL EMBOLECTOMY,  ;  Surgeon: Angelia Mould, MD;  [facility];  Service: Vascular;  Laterality: Right;  Right Femoral Embolectomy, with bovine patch angioplasty,.  . FASCIOTOMY Left 06/24/2014   Procedure: FASCIOTOMY;  Surgeon: Angelia Mould, MD;  Location: [facility];  Service: Vascular;  Laterality: Left;  four compartment Fasciotomy.  [name] LEFT HEART CATHETERIZATION WITH CORONARY ANGIOGRAM N/A 04/22/2012   Procedure: LEFT HEART CATHETERIZATION WITH CORONARY ANGIOGRAM;  Surgeon: Leonie Man, MD;  [facility] C[facility] LAB;  Service: Cardiovascular;  Laterality: N/A;  . PERCUTANEOUS CORONARY STENT INTERVENTION (PCI-S)  04/22/2012   Procedure: PERCUTANEOUS CORONARY STENT INTERVENTION (PCI-S);  Surgeon: Leonie Man, MD;  Location: [facility] CATH LAB;  Service: Cardiovascular;;  . VIDEO BRONCHOSCOPY WITH ENDOBRONCHIAL ULTRASOUND N/A 09/24/2016   Procedure: VIDEO BRONCHOSCOPY WITH ENDOBRONCHIAL ULTRASOUND;  Surgeon: Marshell Garfinkel, MD;  Location: [place];  Service: Pulmonary;  Laterality: N/A;    Social History   Social History  . Marital status: Married    Spouse name: N/A  . Number of children: N/A  . Years of education: N/A   Occupational History  . RETIRED    Social History Main Topics  . Smoking status: Former Smoker    Packs/day: 0.75    Years: 40.00    Types: Cigarettes    Start date: 12/20/1983    Quit date: 09/15/2012  . Smokeless tobacco: Current User    Types: Snuff  . Alcohol use No     Comment: 08/24/2012 "used to drink; not anymore; stopped > 40yr ago; never had any problems w/it"  . Drug use: Yes    Types: Marijuana     Comment: quit 2012  . Sexual  activity: Yes   Other Topics Concern  . 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    Tobacco Use - 1PPD x 40+ years,  has quit x 3 days   ETOH- rare   Drugs- occasional marijuana  1 of 17 kids   [place]        Vitals:   10/06/16 0931  BP: 109/72  Pulse: 69  Weight: 182 lb 3.2 oz (82.6 kg)  Height: '5\' 9"'$  (1.753 m)    PHYSICAL EXAM General: NAD HEENT: Conjunctival pallor. Neck: No JVD, no thyromegaly. Lungs: No rales or wheezes. CV: Nondisplaced PMI.  Regular rate and rhythm, normal S1/S2, no S3/S4, no murmur. No pretibial or periankle edema.   Abdomen: Soft, nontender, no distention.  Neurologic: Alert and oriented.  Psych: Normal affect. Skin: Pallor. Musculoskeletal: No gross deformities.    ECG: Most recent ECG reviewed.      ASSESSMENT AND PLAN:  1. CAD: Stable ischemic heart disease with severe LV dysfunction. Anterolateral STEMI in 04/2012 with overlapping DES to LAD. Not on ASA (due to being on warfarin) or Crestor (due to elevated CK in the past). Continue sotalol 120 mg bid as per EP for VT prophylaxis. No longer on Toprol-XL.  2. Chronic systolic CHF with ICD: Appears euvolemic with NYHA class II symptoms. Not on ACEI due to history of angioedema. Will continue Lasix 20 mg daily and supplemental KCl. Will hold off on spironolactone and losartan due to low normal BP and CKD. Will continue sotalol and Imdur.   3. Hyperlipidemia:On simvastatin 10 mg. I will consider repeating in the future. If not sufficiently reduced, would consider Repatha.  4. Lung cancer: Now on chemo and due to see Dr. Whitney Muse.  5. Carotid stenosis: Chronic RICA occlusion with mild to moderate LICA disease. Not on ASA. Continue statin. On warfarin. He is followed by vascular.  6.  CKD stage 3: Stable. No changes to therapy.  7. PVD: Followed by vascular. On warfarin.  8. Ventricular tachycardia: Continue sotalol 120 mg bid as per EP for VT prophylaxis. F/u with Dr. Rayann Heman.  Dispo: f/u 6 months.  Kate Sable, M.D., F.A.C.C.

## 2016-10-07 ENCOUNTER — Telehealth (HOSPITAL_COMMUNITY): Payer: Self-pay | Admitting: *Deleted

## 2016-10-07 NOTE — Telephone Encounter (Signed)
Contacted for 24 hour f/u post first chemo tx.  Denies any adverse effects from tx.  Instructed to call the clinic with any questions or concerns.

## 2016-10-08 ENCOUNTER — Ambulatory Visit (HOSPITAL_COMMUNITY)
Admission: RE | Admit: 2016-10-08 | Discharge: 2016-10-08 | Disposition: A | Discharge status: Home / Self Care | Payer: Medicare Other | Source: Ambulatory Visit | Attending: Pulmonary Disease | Admitting: Pulmonary Disease

## 2016-10-08 DIAGNOSIS — I672 Cerebral atherosclerosis: Secondary | ICD-10-CM | POA: Diagnosis not present

## 2016-10-08 DIAGNOSIS — G319 Degenerative disease of nervous system, unspecified: Secondary | ICD-10-CM | POA: Diagnosis not present

## 2016-10-08 DIAGNOSIS — C3490 Malignant neoplasm of unspecified part of unspecified bronchus or lung: Secondary | ICD-10-CM | POA: Diagnosis not present

## 2016-10-08 DIAGNOSIS — I639 Cerebral infarction, unspecified: Secondary | ICD-10-CM | POA: Diagnosis not present

## 2016-10-08 MED ORDER — HEPARIN SOD (PORK) LOCK FLUSH 100 UNIT/ML IV SOLN
INTRAVENOUS | Status: AC
  Filled 2016-10-08: qty 5

## 2016-10-08 MED ORDER — IOPAMIDOL (ISOVUE-300) INJECTION 61%
75.0000 mL | Freq: Once | INTRAVENOUS | Status: AC | PRN
  Administered 2016-10-08: 75 mL via INTRAVENOUS

## 2016-10-09 ENCOUNTER — Encounter (HOSPITAL_BASED_OUTPATIENT_CLINIC_OR_DEPARTMENT_OTHER): Payer: Medicare Other

## 2016-10-09 ENCOUNTER — Encounter (HOSPITAL_COMMUNITY): Payer: Self-pay

## 2016-10-09 DIAGNOSIS — Z452 Encounter for adjustment and management of vascular access device: Secondary | ICD-10-CM

## 2016-10-09 DIAGNOSIS — C3492 Malignant neoplasm of unspecified part of left bronchus or lung: Secondary | ICD-10-CM | POA: Diagnosis not present

## 2016-10-09 MED ORDER — HEPARIN SOD (PORK) LOCK FLUSH 100 UNIT/ML IV SOLN
500.0000 [IU] | Freq: Once | INTRAVENOUS | Status: AC
  Administered 2016-10-09: 500 [IU] via INTRAVENOUS
  Filled 2016-10-09: qty 5

## 2016-10-09 MED ORDER — SODIUM CHLORIDE 0.9% FLUSH
10.0000 mL | INTRAVENOUS | Status: DC | PRN
  Administered 2016-10-09: 10 mL via INTRAVENOUS
  Filled 2016-10-09: qty 10

## 2016-10-09 NOTE — Progress Notes (Signed)
Adam House presented for PICC line flush. PICC line located right upper arm Good blood return present. PICC line flushed with 38m NS and 300U/371mHeparin. Procedure without incident. Patient tolerated procedure well.   Patient stated that he is having ringing in his ears, started on first day after treatment. MD made aware.    Vitals stable and discharged via wheelchair. Follow up as scheduled.

## 2016-10-09 NOTE — Patient Instructions (Signed)
[place] at [facility] Discharge Instructions  RECOMMENDATIONS MADE BY THE CONSULTANT AND ANY TEST RESULTS WILL BE SENT TO [place]  Picc line dressing change and picc flush Follow up as scheduled.  Thank you for choosing [place] at [facility] to provide your oncology and hematology care.  To afford each patient quality time with our provider, please arrive at least 15 minutes before your scheduled appointment time.    If you have a lab appointment with the [facility] please come in thru the  Main Entrance and check in at the main information desk  You need to re-schedule your appointment should you arrive 10 or more minutes late.  We strive to give you quality time with our providers, and arriving late affects you and other patients whose appointments are after yours.  Also, if you no show three or more times for appointments you may be dismissed from the clinic at the providers discretion.     Again, thank you for choosing [facility].  Our hope is that these requests will decrease the amount of time that you wait before being seen by our physicians.       _____________________________________________________________  Should you have questions after your visit to [facility], please contact our office at [phone] between the hours of 8:30 a.m. and 4:30 p.m.  Voicemails left after 4:30 p.m. will not be returned until the following business day.  For prescription refill requests, have your pharmacy contact our office.       Resources For Cancer Patients and their Caregivers ? American Cancer Society: Can assist with transportation, wigs, general needs, runs Look Good Feel Better.        864-478-4223 ? Cancer Care: Provides financial assistance, online support groups, medication/co-pay assistance.  1-800-813-HOPE 702-537-0419) ? [place] Assists [place]  cancer patients and their families through emotional , educational and financial support.  8381153749 ? [place] DSS Where to apply for food stamps, Medicaid and utility assistance. (867) 316-9881 ? [facility]: Transportation to medical appointments. 347-210-7849 ? Social Security Administration: May apply for disability if have a Stage IV cancer. 684-530-9954 302-744-6449 ? [employer], Disability and Transit Services: Assists with nutrition, care and transit needs. [place] Support Programs: '@10RELATIVEDAYS'$ @ > Cancer Support Group  2nd Tuesday of the month 1pm-2pm, Journey Room  > Creative Journey  3rd Tuesday of the month 1130am-1pm, Journey Room  > Look Good Feel Better  1st Wednesday of the month 10am-12 noon, Journey Room (Call [place] to register [phone])

## 2016-10-10 ENCOUNTER — Encounter: Payer: Self-pay | Admitting: Cardiology

## 2016-10-12 ENCOUNTER — Encounter (HOSPITAL_COMMUNITY): Payer: Self-pay | Admitting: Emergency Medicine

## 2016-10-12 ENCOUNTER — Other Ambulatory Visit (HOSPITAL_COMMUNITY): Payer: Self-pay | Admitting: Oncology

## 2016-10-12 ENCOUNTER — Inpatient Hospital Stay (HOSPITAL_COMMUNITY)
Admission: EM | Admit: 2016-10-12 | Discharge: 2016-10-16 | DRG: 640 | Disposition: E | Payer: Medicare Other | Attending: Internal Medicine | Admitting: Internal Medicine

## 2016-10-12 DIAGNOSIS — M19041 Primary osteoarthritis, right hand: Secondary | ICD-10-CM | POA: Diagnosis present

## 2016-10-12 DIAGNOSIS — T451X5A Adverse effect of antineoplastic and immunosuppressive drugs, initial encounter: Secondary | ICD-10-CM | POA: Diagnosis present

## 2016-10-12 DIAGNOSIS — M19072 Primary osteoarthritis, left ankle and foot: Secondary | ICD-10-CM | POA: Diagnosis present

## 2016-10-12 DIAGNOSIS — R579 Shock, unspecified: Secondary | ICD-10-CM | POA: Diagnosis not present

## 2016-10-12 DIAGNOSIS — Z8249 Family history of ischemic heart disease and other diseases of the circulatory system: Secondary | ICD-10-CM

## 2016-10-12 DIAGNOSIS — I4891 Unspecified atrial fibrillation: Secondary | ICD-10-CM | POA: Diagnosis present

## 2016-10-12 DIAGNOSIS — I6521 Occlusion and stenosis of right carotid artery: Secondary | ICD-10-CM | POA: Diagnosis present

## 2016-10-12 DIAGNOSIS — Z981 Arthrodesis status: Secondary | ICD-10-CM

## 2016-10-12 DIAGNOSIS — H5704 Mydriasis: Secondary | ICD-10-CM | POA: Diagnosis present

## 2016-10-12 DIAGNOSIS — I69354 Hemiplegia and hemiparesis following cerebral infarction affecting left non-dominant side: Secondary | ICD-10-CM | POA: Diagnosis not present

## 2016-10-12 DIAGNOSIS — E785 Hyperlipidemia, unspecified: Secondary | ICD-10-CM | POA: Diagnosis not present

## 2016-10-12 DIAGNOSIS — R011 Cardiac murmur, unspecified: Secondary | ICD-10-CM | POA: Diagnosis present

## 2016-10-12 DIAGNOSIS — N170 Acute kidney failure with tubular necrosis: Secondary | ICD-10-CM | POA: Diagnosis present

## 2016-10-12 DIAGNOSIS — I5022 Chronic systolic (congestive) heart failure: Secondary | ICD-10-CM | POA: Diagnosis present

## 2016-10-12 DIAGNOSIS — R680 Hypothermia, not associated with low environmental temperature: Secondary | ICD-10-CM | POA: Diagnosis not present

## 2016-10-12 DIAGNOSIS — Z95810 Presence of automatic (implantable) cardiac defibrillator: Secondary | ICD-10-CM | POA: Diagnosis not present

## 2016-10-12 DIAGNOSIS — R6510 Systemic inflammatory response syndrome (SIRS) of non-infectious origin without acute organ dysfunction: Secondary | ICD-10-CM | POA: Diagnosis present

## 2016-10-12 DIAGNOSIS — N179 Acute kidney failure, unspecified: Secondary | ICD-10-CM

## 2016-10-12 DIAGNOSIS — Z95828 Presence of other vascular implants and grafts: Secondary | ICD-10-CM

## 2016-10-12 DIAGNOSIS — Z87891 Personal history of nicotine dependence: Secondary | ICD-10-CM

## 2016-10-12 DIAGNOSIS — E7800 Pure hypercholesterolemia, unspecified: Secondary | ICD-10-CM | POA: Diagnosis present

## 2016-10-12 DIAGNOSIS — N182 Chronic kidney disease, stage 2 (mild): Secondary | ICD-10-CM | POA: Diagnosis not present

## 2016-10-12 DIAGNOSIS — Z7901 Long term (current) use of anticoagulants: Secondary | ICD-10-CM | POA: Diagnosis not present

## 2016-10-12 DIAGNOSIS — I472 Ventricular tachycardia: Secondary | ICD-10-CM | POA: Diagnosis not present

## 2016-10-12 DIAGNOSIS — Z801 Family history of malignant neoplasm of trachea, bronchus and lung: Secondary | ICD-10-CM

## 2016-10-12 DIAGNOSIS — I714 Abdominal aortic aneurysm, without rupture: Secondary | ICD-10-CM | POA: Diagnosis present

## 2016-10-12 DIAGNOSIS — K921 Melena: Secondary | ICD-10-CM | POA: Diagnosis present

## 2016-10-12 DIAGNOSIS — Z955 Presence of coronary angioplasty implant and graft: Secondary | ICD-10-CM

## 2016-10-12 DIAGNOSIS — I130 Hypertensive heart and chronic kidney disease with heart failure and stage 1 through stage 4 chronic kidney disease, or unspecified chronic kidney disease: Secondary | ICD-10-CM | POA: Diagnosis present

## 2016-10-12 DIAGNOSIS — R197 Diarrhea, unspecified: Secondary | ICD-10-CM | POA: Diagnosis present

## 2016-10-12 DIAGNOSIS — K7200 Acute and subacute hepatic failure without coma: Secondary | ICD-10-CM | POA: Diagnosis not present

## 2016-10-12 DIAGNOSIS — I255 Ischemic cardiomyopathy: Secondary | ICD-10-CM | POA: Diagnosis present

## 2016-10-12 DIAGNOSIS — J9691 Respiratory failure, unspecified with hypoxia: Secondary | ICD-10-CM | POA: Diagnosis not present

## 2016-10-12 DIAGNOSIS — E162 Hypoglycemia, unspecified: Secondary | ICD-10-CM | POA: Diagnosis present

## 2016-10-12 DIAGNOSIS — C3492 Malignant neoplasm of unspecified part of left bronchus or lung: Secondary | ICD-10-CM | POA: Diagnosis present

## 2016-10-12 DIAGNOSIS — I10 Essential (primary) hypertension: Secondary | ICD-10-CM | POA: Diagnosis present

## 2016-10-12 DIAGNOSIS — Z66 Do not resuscitate: Secondary | ICD-10-CM | POA: Diagnosis present

## 2016-10-12 DIAGNOSIS — I252 Old myocardial infarction: Secondary | ICD-10-CM

## 2016-10-12 DIAGNOSIS — I469 Cardiac arrest, cause unspecified: Secondary | ICD-10-CM | POA: Diagnosis not present

## 2016-10-12 DIAGNOSIS — N183 Chronic kidney disease, stage 3 (moderate): Secondary | ICD-10-CM | POA: Diagnosis present

## 2016-10-12 DIAGNOSIS — E860 Dehydration: Secondary | ICD-10-CM | POA: Diagnosis present

## 2016-10-12 DIAGNOSIS — I739 Peripheral vascular disease, unspecified: Secondary | ICD-10-CM | POA: Diagnosis present

## 2016-10-12 DIAGNOSIS — I2510 Atherosclerotic heart disease of native coronary artery without angina pectoris: Secondary | ICD-10-CM | POA: Diagnosis present

## 2016-10-12 DIAGNOSIS — Z8679 Personal history of other diseases of the circulatory system: Secondary | ICD-10-CM

## 2016-10-12 DIAGNOSIS — I4901 Ventricular fibrillation: Secondary | ICD-10-CM | POA: Diagnosis not present

## 2016-10-12 DIAGNOSIS — Z888 Allergy status to other drugs, medicaments and biological substances status: Secondary | ICD-10-CM

## 2016-10-12 DIAGNOSIS — D708 Other neutropenia: Secondary | ICD-10-CM

## 2016-10-12 DIAGNOSIS — M19042 Primary osteoarthritis, left hand: Secondary | ICD-10-CM | POA: Diagnosis present

## 2016-10-12 DIAGNOSIS — M109 Gout, unspecified: Secondary | ICD-10-CM | POA: Diagnosis present

## 2016-10-12 DIAGNOSIS — D701 Agranulocytosis secondary to cancer chemotherapy: Secondary | ICD-10-CM

## 2016-10-12 DIAGNOSIS — J9690 Respiratory failure, unspecified, unspecified whether with hypoxia or hypercapnia: Secondary | ICD-10-CM

## 2016-10-12 DIAGNOSIS — Z825 Family history of asthma and other chronic lower respiratory diseases: Secondary | ICD-10-CM

## 2016-10-12 DIAGNOSIS — Z833 Family history of diabetes mellitus: Secondary | ICD-10-CM

## 2016-10-12 DIAGNOSIS — K219 Gastro-esophageal reflux disease without esophagitis: Secondary | ICD-10-CM | POA: Diagnosis present

## 2016-10-12 DIAGNOSIS — M19071 Primary osteoarthritis, right ankle and foot: Secondary | ICD-10-CM | POA: Diagnosis present

## 2016-10-12 DIAGNOSIS — G931 Anoxic brain damage, not elsewhere classified: Secondary | ICD-10-CM | POA: Diagnosis not present

## 2016-10-12 DIAGNOSIS — D72819 Decreased white blood cell count, unspecified: Secondary | ICD-10-CM

## 2016-10-12 DIAGNOSIS — E874 Mixed disorder of acid-base balance: Secondary | ICD-10-CM | POA: Diagnosis present

## 2016-10-12 DIAGNOSIS — M170 Bilateral primary osteoarthritis of knee: Secondary | ICD-10-CM | POA: Diagnosis present

## 2016-10-12 LAB — INFLUENZA PANEL BY PCR (TYPE A & B)
Influenza A By PCR: NEGATIVE
Influenza B By PCR: NEGATIVE

## 2016-10-12 LAB — PROTIME-INR
INR: 1.61
Prothrombin Time: 19.3 seconds — ABNORMAL HIGH (ref 11.4–15.2)

## 2016-10-12 LAB — LACTIC ACID, PLASMA: Lactic Acid, Venous: 2.4 mmol/L (ref 0.5–1.9)

## 2016-10-12 MED ORDER — HYDROMORPHONE HCL 1 MG/ML IJ SOLN
1.0000 mg | Freq: Once | INTRAMUSCULAR | Status: AC
  Administered 2016-10-12: 1 mg via INTRAVENOUS
  Filled 2016-10-12: qty 1

## 2016-10-12 MED ORDER — ACETAMINOPHEN 650 MG RE SUPP: 650.0000 mg | Freq: Four times a day (QID) | RECTAL | Status: DC | PRN

## 2016-10-12 MED ORDER — NITROGLYCERIN 0.4 MG SL SUBL: 0.4000 mg | SUBLINGUAL_TABLET | SUBLINGUAL | Status: DC | PRN

## 2016-10-12 MED ORDER — FILGRASTIM 300 MCG/ML IJ SOLN
300.0000 ug | Freq: Every day | INTRAMUSCULAR | Status: DC
  Administered 2016-10-12: 300 ug via SUBCUTANEOUS
  Filled 2016-10-12 (×2): qty 1

## 2016-10-12 MED ORDER — WARFARIN - PHARMACIST DOSING INPATIENT: Status: DC

## 2016-10-12 MED ORDER — TRAMADOL HCL 50 MG PO TABS
50.0000 mg | ORAL_TABLET | Freq: Four times a day (QID) | ORAL | Status: DC | PRN
  Administered 2016-10-13: 50 mg via ORAL
  Filled 2016-10-12: qty 1

## 2016-10-12 MED ORDER — SIMVASTATIN 20 MG PO TABS
10.0000 mg | ORAL_TABLET | Freq: Every day | ORAL | Status: DC
  Filled 2016-10-12: qty 1

## 2016-10-12 MED ORDER — FILGRASTIM 300 MCG/ML IJ SOLN
INTRAMUSCULAR | Status: AC
  Filled 2016-10-12: qty 1

## 2016-10-12 MED ORDER — PIPERACILLIN-TAZOBACTAM 3.375 G IVPB 30 MIN
3.3750 g | Freq: Once | INTRAVENOUS | Status: AC
  Administered 2016-10-12 (×2): 3.375 g via INTRAVENOUS
  Filled 2016-10-12: qty 50

## 2016-10-12 MED ORDER — ACETAMINOPHEN 325 MG PO TABS
650.0000 mg | ORAL_TABLET | Freq: Four times a day (QID) | ORAL | Status: DC | PRN
  Administered 2016-10-12: 650 mg via ORAL
  Filled 2016-10-12: qty 2

## 2016-10-12 MED ORDER — IPRATROPIUM BROMIDE 0.02 % IN SOLN: 0.5000 mg | Freq: Four times a day (QID) | RESPIRATORY_TRACT | Status: DC | PRN

## 2016-10-12 MED ORDER — WARFARIN SODIUM 5 MG PO TABS
5.0000 mg | ORAL_TABLET | Freq: Once | ORAL | Status: DC
  Filled 2016-10-12: qty 1

## 2016-10-12 MED ORDER — ONDANSETRON HCL 4 MG PO TABS: 4.0000 mg | ORAL_TABLET | Freq: Four times a day (QID) | ORAL | Status: DC | PRN

## 2016-10-12 MED ORDER — ONDANSETRON HCL 4 MG/2ML IJ SOLN
4.0000 mg | Freq: Once | INTRAMUSCULAR | Status: AC
  Administered 2016-10-12: 4 mg via INTRAVENOUS
  Filled 2016-10-12: qty 2

## 2016-10-12 MED ORDER — ALPRAZOLAM 0.5 MG PO TABS
0.5000 mg | ORAL_TABLET | Freq: Two times a day (BID) | ORAL | Status: DC | PRN
  Administered 2016-10-13: 0.5 mg via ORAL
  Filled 2016-10-12 (×2): qty 1

## 2016-10-12 MED ORDER — SODIUM CHLORIDE 0.9 % IV SOLN: INTRAVENOUS | Status: AC

## 2016-10-12 MED ORDER — SODIUM CHLORIDE 0.9 % IV BOLUS (SEPSIS)
1000.0000 mL | Freq: Once | INTRAVENOUS | Status: AC
  Administered 2016-10-12: 1000 mL via INTRAVENOUS

## 2016-10-12 MED ORDER — PANTOPRAZOLE SODIUM 40 MG PO TBEC: 40.0000 mg | DELAYED_RELEASE_TABLET | Freq: Every day | ORAL | Status: DC

## 2016-10-12 MED ORDER — BOOST / RESOURCE BREEZE PO LIQD
1.0000 | Freq: Three times a day (TID) | ORAL | Status: DC
  Administered 2016-10-14: 1 via ORAL

## 2016-10-12 MED ORDER — ENSURE ENLIVE PO LIQD
237.0000 mL | Freq: Two times a day (BID) | ORAL | Status: DC
  Administered 2016-10-14: 237 mL via ORAL

## 2016-10-12 MED ORDER — SENNOSIDES-DOCUSATE SODIUM 8.6-50 MG PO TABS: 1.0000 | ORAL_TABLET | Freq: Every evening | ORAL | Status: DC | PRN

## 2016-10-12 MED ORDER — ONDANSETRON HCL 4 MG/2ML IJ SOLN
4.0000 mg | Freq: Four times a day (QID) | INTRAMUSCULAR | Status: DC | PRN
  Administered 2016-10-12: 4 mg via INTRAVENOUS
  Filled 2016-10-12: qty 2

## 2016-10-12 MED ORDER — SODIUM CHLORIDE 0.9 % IV SOLN: INTRAVENOUS | Status: DC

## 2016-10-12 MED ORDER — ALBUTEROL SULFATE (2.5 MG/3ML) 0.083% IN NEBU: 3.0000 mL | INHALATION_SOLUTION | Freq: Four times a day (QID) | RESPIRATORY_TRACT | Status: DC | PRN

## 2016-10-12 MED ORDER — ALLOPURINOL 300 MG PO TABS
300.0000 mg | ORAL_TABLET | Freq: Every day | ORAL | Status: DC
  Administered 2016-10-12: 300 mg via ORAL
  Filled 2016-10-12: qty 1

## 2016-10-12 MED ORDER — IPRATROPIUM-ALBUTEROL 0.5-2.5 (3) MG/3ML IN SOLN: 3.0000 mL | Freq: Four times a day (QID) | RESPIRATORY_TRACT | Status: DC | PRN

## 2016-10-12 MED ORDER — SODIUM CHLORIDE 0.9 % IV BOLUS (SEPSIS)
250.0000 mL | Freq: Once | INTRAVENOUS | Status: AC
  Administered 2016-10-12: 250 mL via INTRAVENOUS

## 2016-10-12 MED ORDER — FEBUXOSTAT 40 MG PO TABS
40.0000 mg | ORAL_TABLET | Freq: Every day | ORAL | Status: DC
  Administered 2016-10-12: 40 mg via ORAL
  Filled 2016-10-12 (×4): qty 1

## 2016-10-12 NOTE — ED Notes (Signed)
Call to floor Katharine Look, RN to report

## 2016-10-12 NOTE — ED Notes (Signed)
Pt in with NS and Vancomycin hanging. Dr Raliegh Ip in to assess

## 2016-10-12 NOTE — ED Notes (Signed)
Staff informed that pt should not get otu of bed due to fall risk as well as for his recent falls and weakness

## 2016-10-12 NOTE — Progress Notes (Signed)
ANTICOAGULATION CONSULT NOTE - Initial Consult  Pharmacy Consult for COUMADIN (home med) Indication: atrial fibrillation  Allergies  Allergen Reactions  . Lisinopril Anaphylaxis and Swelling    angioedema  . Naproxen Hives  . Prednisone Nausea And Vomiting and Other (See Comments)    "I felt like my stomach was in knots, very painful"    Patient Measurements: Height: '5\' 9"'$  (175.3 cm) Weight: 177 lb 14.6 oz (80.7 kg) IBW/kg (Calculated) : 70.7  Vital Signs: Temp: 98.6 F (37 C) (01/28 1716) Temp Source: Oral (01/28 1716) BP: 91/56 (01/28 1716) Pulse Rate: 75 (01/28 1716)  Labs:  Recent Labs  10/05/2016 1859  LABPROT 19.3*  INR 1.61    Estimated Creatinine Clearance: 45.2 mL/min (by C-G formula based on SCr of 1.63 mg/dL (H)).   Medical History: Past Medical History:  Diagnosis Date  . Arthritis    gout  . Asthma   . Atrial fibrillation ([place])   . CAD (coronary artery disease)    a. ant MI 1998 with PCI to LAD. b. Ant-lat MI 04/2012 tx with overlapping DES to LAD.  [name] CHF (congestive heart failure) ([place])   . CRI (chronic renal insufficiency)   . CVA (cerebral infarction) 11/2010  . DVT (deep venous thrombosis) ([place])   . Esophageal reflux   . Gout   . Heart murmur   . Hypercholesteremia   . ICD (implantable cardiac defibrillator) in place   . Impotence of organic origin   . Ischemic cardiomyopathy    EF 25-30% by echo 04/2012; [place]  . Lung disease, interstitial ([place])   . Myocardial infarction 1998 and 2005  . Peripheral arterial disease ([place])   . Pneumonia    hx  . 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 Right internal carotid occlusion   . Stroke [facility]) ? 2006  . Systolic CHF (HCC)    NYHA Class II/III  . Tobacco use disorder   . Unspecified essential hypertension     Medications:  Prescriptions Prior to Admission  Medication Sig Dispense Refill Last Dose  . albuterol (PROVENTIL HFA;VENTOLIN HFA) 108 (90 Base) MCG/ACT inhaler Inhale  2 puffs into the lungs every 6 (six) hours as needed for wheezing or shortness of breath. 1 Inhaler 1 10/11/2016 at Unknown time  . allopurinol (ZYLOPRIM) 300 MG tablet Take 300 mg by mouth daily.  4 Past Week at Unknown time  . ALPRAZolam (XANAX) 0.5 MG tablet Take 0.5 mg by mouth 2 (two) times daily as needed for anxiety.   10/04/2016 at Unknown time  . colchicine 0.6 MG tablet Take 0.6 mg by mouth daily.   10/11/2016 at Unknown time  . ETOPOSIDE IV Inject into the vein. Days 1-3 every 21 days   10/08/2016 at Unknown time  . furosemide (LASIX) 20 MG tablet Take 1 tablet (20 mg total) by mouth daily. 90 tablet 3 10/11/2016 at Unknown time  . ipratropium (ATROVENT) 0.02 % nebulizer solution Take 0.5 mg by nebulization every 6 (six) hours as needed for wheezing or shortness of breath.   Past Month at Unknown time  . isosorbide mononitrate (IMDUR) 30 MG 24 hr tablet Take 1 tablet (30 mg total) by mouth daily. 90 tablet 3 10/11/2016 at Unknown time  . omeprazole (PRILOSEC) 20 MG capsule Take 20 mg by mouth daily.  2 10/11/2016 at Unknown time  . simvastatin (ZOCOR) 10 MG tablet Take 1 tablet (10 mg total) by mouth at bedtime. 90 tablet 3 10/11/2016 at Unknown time  .  sotalol (BETAPACE) 120 MG tablet Take 120 mg by mouth 2 (two) times daily.  4 10/11/2016 at Unknown time  . ULORIC 40 MG tablet Take 1 tablet by mouth daily.  3 10/11/2016 at Unknown time  . NITROSTAT 0.4 MG SL tablet PLACE 1 TABLET UNDER TONGUE EVERY 5 MINUTES AS NEEDED FOR CHEST PAIN (UP TO 3 DOSES) 25 tablet 3 unknown  . ondansetron (ZOFRAN) 8 MG tablet Take 1 tablet (8 mg total) by mouth every 8 (eight) hours as needed for nausea or vomiting. 30 tablet 2 10/01/2016  . Pegfilgrastim (NEULASTA ONPRO [place]) Inject into the skin. Every 21 days - to be administered 27 hours post chemo completion   10/08/2016  . potassium chloride (K-DUR) 10 MEQ tablet Take 10 mEq by mouth 2 (two) times daily.   unknown  . prochlorperazine (COMPAZINE) 10 MG tablet Take 1  tablet (10 mg total) by mouth every 6 (six) hours as needed for nausea or vomiting. 30 tablet 2 10/08/2016  . warfarin (COUMADIN) 5 MG tablet Take 1 tablet (5 mg total) by mouth daily. 30 tablet 6 10/10/2016 at 0800    Assessment: 66yo male on chronic Coumadin for h/o afib.  INR SUBtherapeutic on admission.  Home dose listed above, last dose taken reportedly 1/26  Goal of Therapy:  INR 2-3 Monitor platelets by anticoagulation protocol: Yes   Plan:  Coumadin '5mg'$  tonight x 1 INR daily  Hart Robinsons A 09/22/2016,7:46 PM

## 2016-10-12 NOTE — ED Notes (Signed)
In room pt was on the toilet for prolonged period of time- Due to his weakness, he is informed to stay in bed and ring for bedpan. Assisted to bed and rails up- meds as ordered, comfort measures awaiting bed assignment

## 2016-10-12 NOTE — ED Notes (Signed)
Dilaudid 1 mg given to pt- written waste in pixis is in error and CN is aware and verified dose

## 2016-10-12 NOTE — ED Provider Notes (Signed)
[facility] DEPT Provider Note   CSN: [id number] Arrival date & time: 09/21/2016  1208  By signing my name below, I, Sonum Patel, attest that this documentation has been prepared under the direction and in the presence of Virgel Manifold, MD. Electronically Signed: Sonum Patel, Education administrator. 10/05/2016. 1:01 PM.  History   Chief Complaint Chief Complaint  Patient presents with  . Neutropenia    The history is provided by the patient. No language interpreter was used.     HPI Comments: Adam House is a 66 y.o. male who presents to the Emergency Department complaining of nausea and vomiting with associated diarrhea, generalized malaise, subjective fever, chills, and body pain that began yesterday. He is currently being treated for lung cancer and finished his last chemo treatment yesterday. He was initially seen at [facility] today but was sent here to be admitted since his oncologist (Dr. Whitney Muse) is here. He currently takes Coumadin. He reports a mild cough, blood in the stool, and abdominal cramps which are intermittent. He denies SOB.   Records from outside hospital given to unit secretary for scanning into his records here. They are briefly summarized below.  10/13/15  Vitals:  BP: 97-109/55-62 RR: 22-32 Temp: 97.8 HR: 99-132  Labs:  Sodium: 129 Potassium: 4.5 Chloride: 93 Carbon dioxide: 20.0 Anion gap 21 BUN: 42 Creatinine: 2.04 Glucose: 91 Calcium: 8.7  AST: 11 ELT: 10 Alkaline phosphatase: 75 Total bilirubin 1.1 Albumin: 3.4  Lipase 19  PT: 15.3 INR: 1.5  WBC: 0.2 -Absolute lymphocytes: 0.2 -Absolute neutrophil count, absolute monocytes, absolute eosinophils, absolute basophils: 0.0 Hemoglobin: 14.7 Hematocrit: 14.2 MCV: 70.0 Platelets: 58,000  ABG (2 L/m via nasal cannula ) Blood pH: 7.33 PCO2: 27.0 PO2: 73 HC03: 14.2  Blood cultures were obtained.  Urinalysis was ordered but patient reports that he was unable to provide a sample.  Order for  influenza testing but I cannot tell if the sample was obtained nor do I see any results.  Medications administered 1 L normal saline, 1 mg of Dilaudid, 4 mg of ondansetron, 1,250 mg vancomycin IV.  Acute abdominal series with one view chest, impression:   Nonobstructive bowel gas pattern. Low lung volumes with interstitial prominence of the left lower lobe. Previously demonstrated by PET-CT left lower lobe mass is not well visualized radiographically.  Past Medical History:  Diagnosis Date  . Arthritis    gout  . Asthma   . Atrial fibrillation ([place])   . CAD (coronary artery disease)    a. ant MI 1998 with PCI to LAD. b. Ant-lat MI 04/2012 tx with overlapping DES to LAD.  [name] CHF (congestive heart failure) ([place])   . CRI (chronic renal insufficiency)   . CVA (cerebral infarction) 11/2010  . DVT (deep venous thrombosis) ([place])   . Esophageal reflux   . Gout   . Heart murmur   . Hypercholesteremia   . ICD (implantable cardiac defibrillator) in place   . Impotence of organic origin   . Ischemic cardiomyopathy    EF 25-30% by echo 04/2012; [place]  . Lung disease, interstitial ([place])   . Myocardial infarction 1998 and 2005  . Peripheral arterial disease ([place])   . Pneumonia    hx  . 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 Right internal carotid occlusion   . Stroke [facility]) ? 2006  . Systolic CHF (HCC)    NYHA Class II/III  . Tobacco use disorder   . Unspecified essential hypertension     Patient  Active Problem List   Diagnosis Date Noted  . Small cell lung cancer, left ([place]) 09/30/2016  . DDD cervical spine 08/29/2016  . Primary osteoarthritis of both knees 08/29/2016  . Chondromalacia patellae, left knee 08/29/2016  . Primary osteoarthritis of both feet 08/29/2016  . Primary osteoarthritis of both hands 08/29/2016  . Idiopathic chronic gout, unspecified site, without tophus (tophi) 08/27/2016  . Atrial fibrillation ([place]) 01/18/2016  . VT (ventricular tachycardia)  ([place]) 07/07/2014  . Acute venous embolism and thrombosis of deep vessels of proximal lower extremity ([place]) 07/04/2014  . Ischemia of lower extremity 06/24/2014  . Lung disease, interstitial ([place])   . ILD (interstitial lung disease) ([place]) 06/05/2014  . Dyspnea and respiratory abnormality 05/01/2014  . Carotid artery stenosis 12/06/2013  . 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 Carotid artery disease ([place]) 10/28/2013  . Ventricular tachyarrhythmia ([place]) 07/07/2013  . Tobacco abuse 09/03/2012  . Carotid bruit 09/03/2012  . Chronic systolic CHF (congestive heart failure) ([place]) 08/25/2012  . NSTEMI (non-ST elevated myocardial infarction) ([place]) 08/25/2012  . Hyperlipidemia 05/13/2012  . Lip swelling 04/26/2012  . STEMI (ST elevation myocardial infarction) ([place]) 04/24/2012  . Acute on chronic systolic CHF (congestive heart failure) ([place]) 04/24/2012  . Encounter for servicing of automatic implantable cardioverter-defibrillator (AICD) at end of battery life 04/24/2012  . Chronic kidney disease (CKD), stage II (mild) 04/24/2012  . Acute renal failure ([place]) 04/24/2012  . Pericarditis 04/24/2012  . Other primary cardiomyopathies 03/15/2012  . Hypertension 02/17/2011  . NICOTINE ADDICTION 03/15/2010  . IMPLANTATION OF DEFIBRILLATOR, HX OF 01/24/2010  . CARDIOMYOPATHY, ISCHEMIC 12/03/2009  . ESSENTIAL HYPERTENSION, BENIGN 10/01/2009  . Coronary artery disease 10/01/2009  . LEFT VENTRICULAR FUNCTION, DECREASED 10/01/2009  . AAA 10/01/2009  . AMI 09/28/2009  . RENAL FAILURE 09/28/2009  . 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 Chronic systolic dysfunction of left ventricle 09/28/2009  . Dyslipidemia 07/12/2009    Past Surgical History:  Procedure Laterality Date  . ANTERIOR CERVICAL DECOMP/DISCECTOMY FUSION  2007   "put in 4 screws to hold my head on; ruptured C5; job related injury" (08/24/2012)  . CARDIAC DEFIBRILLATOR PLACEMENT   2011   [employer]  he is a MADIT RIT study patient  . CORONARY ANGIOPLASTY WITH STENT PLACEMENT  05/1997; 04/2012   "1 + 2; total of 3" (08/24/2012)  . EMBOLECTOMY Right 06/24/2014   Procedure: RIGHT FEMORAL EMBOLECTOMY,  ;  Surgeon: Angelia Mould, MD;  Location: [facility] OR;  Service: Vascular;  Laterality: Right;  Right Femoral Embolectomy, with bovine patch angioplasty,.  . FASCIOTOMY Left 06/24/2014   Procedure: FASCIOTOMY;  Surgeon: Angelia Mould, MD;  Location: [facility];  Service: Vascular;  Laterality: Left;  four compartment Fasciotomy.  [name] LEFT HEART CATHETERIZATION WITH CORONARY ANGIOGRAM N/A 04/22/2012   Procedure: LEFT HEART CATHETERIZATION WITH CORONARY ANGIOGRAM;  Surgeon: Leonie Man, MD;  Location[facility] C[facility] LAB;  Service: Cardiovascular;  Laterality: N/A;  . PERCUTANEOUS CORONARY STENT INTERVENTION (PCI-S)  04/22/2012   Procedure: PERCUTANEOUS CORONARY STENT INTERVENTION (PCI-S);  Surgeon: Leonie Man, MD;  Location: [facility] CATH LAB;  Service: Cardiovascular;;  . VIDEO BRONCHOSCOPY WITH ENDOBRONCHIAL ULTRASOUND N/A 09/24/2016   Procedure: VIDEO BRONCHOSCOPY WITH ENDOBRONCHIAL ULTRASOUND;  Surgeon: Marshell Garfinkel, MD;  Location: [facility];  Service: Pulmonary;  Laterality: N/A;       Home Medications    Prior to Admission medications   Medication Sig Start Date End Date Taking? Authorizing Provider  albuterol (PROVENTIL  HFA;VENTOLIN HFA) 108 (90 Base) MCG/ACT inhaler Inhale 2 puffs into the lungs every 6 (six) hours as needed for wheezing or shortness of breath. 05/13/16   Herminio Commons, MD  allopurinol (ZYLOPRIM) 300 MG tablet Take 300 mg by mouth daily. 08/25/15   Historical Provider, MD  ALPRAZolam Duanne Moron) 0.5 MG tablet Take 0.5 mg by mouth 2 (two) times daily as needed for anxiety.    Historical Provider, MD  CISPLATIN IV Inject into the vein. Day 1 every 21 days    Historical Provider, MD  colchicine 0.6 MG tablet Take 0.6 mg by mouth daily.    Historical  Provider, MD  ETOPOSIDE IV Inject into the vein. Days 1-3 every 21 days    Historical Provider, MD  furosemide (LASIX) 20 MG tablet Take 1 tablet (20 mg total) by mouth daily. 11/13/15   Herminio Commons, MD  ipratropium (ATROVENT) 0.02 % nebulizer solution Take 0.5 mg by nebulization every 6 (six) hours as needed for wheezing or shortness of breath.    Historical Provider, MD  isosorbide mononitrate (IMDUR) 30 MG 24 hr tablet Take 1 tablet (30 mg total) by mouth daily. 10/24/15   Herminio Commons, MD  NITROSTAT 0.4 MG SL tablet PLACE 1 TABLET UNDER TONGUE EVERY 5 MINUTES AS NEEDED FOR CHEST PAIN (UP TO 3 DOSES) 03/12/16   Herminio Commons, MD  omeprazole (PRILOSEC) 20 MG capsule Take 20 mg by mouth daily. 11/30/15   Historical Provider, MD  ondansetron (ZOFRAN) 8 MG tablet Take 1 tablet (8 mg total) by mouth every 8 (eight) hours as needed for nausea or vomiting. 10/01/16   Patrici Ranks, MD  Pegfilgrastim (NEULASTA ONPRO [place]) Inject into the skin. Every 21 days - to be administered 27 hours post chemo completion    Historical Provider, MD  potassium chloride (K-DUR) 10 MEQ tablet Take 10 mEq by mouth 2 (two) times daily.    Historical Provider, MD  prochlorperazine (COMPAZINE) 10 MG tablet Take 1 tablet (10 mg total) by mouth every 6 (six) hours as needed for nausea or vomiting. 10/01/16   Patrici Ranks, MD  simvastatin (ZOCOR) 10 MG tablet Take 1 tablet (10 mg total) by mouth at bedtime. 09/24/15   Herminio Commons, MD  sotalol (BETAPACE) 120 MG tablet Take 120 mg by mouth 2 (two) times daily. 10/01/15   Historical Provider, MD  ULORIC 40 MG tablet Take 1 tablet by mouth daily. 01/02/16   Historical Provider, MD  warfarin (COUMADIN) 5 MG tablet Take 1 tablet (5 mg total) by mouth daily. 09/26/14   Thompson Grayer, MD    Family History Family History  Problem Relation Age of Onset  . Diabetes Mother   . Cancer Mother     Ovarian  . Heart disease Mother   . Hyperlipidemia Mother   .  Hypertension Mother   . Heart attack Mother   . Heart disease Father   . Hyperlipidemia Father   . Hypertension Father   . Heart attack Father   . Heart disease Brother   . Heart attack Brother   . Cancer Sister     Ovarian-Liver-Lung-Brain  . Heart disease Sister   . Heart attack Daughter   . Heart disease Daughter     Before age 31  . Allergies Daughter   . Cancer Sister     Lung  . Heart disease Sister   . Emphysema Paternal Grandfather     Social History Social History  Substance Use  Topics  . Smoking status: Former Smoker    Packs/day: 0.75    Years: 40.00    Types: Cigarettes    Start date: 12/20/1983    Quit date: 09/15/2012  . Smokeless tobacco: Current User    Types: Snuff  . Alcohol use No     Comment: 08/24/2012 "used to drink; not anymore; stopped > 41yr ago; never had any problems w/it"     Allergies   Lisinopril; Naproxen; and Prednisone   Review of Systems Review of Systems  A complete 10 system review of systems was obtained and all systems are negative except as noted in the HPI and PMH.    Physical Exam Updated Vital Signs BP (!) 71/41 (BP Location: Left Arm) Comment: upon return from bathroom  Pulse 90   Temp 98.4 F (36.9 C) (Oral)   Resp 20   Ht '5\' 9"'$  (1.753 m)   Wt 170 lb (77.1 kg)   SpO2 92%   BMI 25.10 kg/m   Physical Exam  Constitutional: He is oriented to person, place, and time. He appears well-developed and well-nourished.  Appears tired and generally weak. Able to ambulate to the bathroom but needed some assistance.   HENT:  Head: Normocephalic and atraumatic.  Eyes: EOM are normal.  Neck: Normal range of motion.  Cardiovascular: Normal rate, regular rhythm, normal heart sounds and intact distal pulses.   Pulmonary/Chest: Effort normal and breath sounds normal. No respiratory distress.  Abdominal: Soft. He exhibits no distension. There is tenderness ( across abdomen). Guarding: voluntary.  Musculoskeletal: Normal range of  motion.  Neurological: He is alert and oriented to person, place, and time.  Skin: Skin is warm and dry.  Psychiatric: He has a normal mood and affect. Judgment normal.  Nursing note and vitals reviewed.    ED Treatments / Results  DIAGNOSTIC STUDIES: Oxygen Saturation is 100% on RA, normal by my interpretation.    COORDINATION OF CARE: 1:01 PM Discussed treatment plan with pt at bedside and pt agreed to plan.   Labs (all labs ordered are listed, but only abnormal results are displayed) Labs Reviewed  LACTIC ACID, PLASMA - Abnormal; Notable for the following:       Result Value   Lactic Acid, Venous 2.4 (*)    All other components within normal limits  C DIFFICILE QUICK SCREEN W PCR REFLEX  INFLUENZA PANEL BY PCR (TYPE A & B)  LACTIC ACID, PLASMA  URINALYSIS, ROUTINE W REFLEX MICROSCOPIC    EKG  EKG Interpretation None       Radiology No results found.  Procedures Procedures (including critical care time)  Medications Ordered in ED Medications - No data to display   Initial Impression / Assessment and Plan / ED Course  I have reviewed the triage vital signs and the nursing notes.  Pertinent labs & imaging results that were available during my care of the patient were reviewed by me and considered in my medical decision making (see chart for details).     66year old male with generalized fatigue, abdominal pain subjective fever since yesterday. He is currently undergoing chemotherapy. Records reviewed from outside hospital. His absolute neutrophil count is 0. He is afebrile here and at outside facility but does report chills. He has had intermittent hypotension. He received vancomycin at outside facility. Zosyn added. Blood cultures were obtained at outside facility. I cannot clearly tell flu testing was obtained or not. Influenza panel was sent here. Lactic acid and UA added as well. Given his  multiple visits to healthcare facilities and innumerable watery  stools, will also check for C. difficile. Continue fluid resuscitation. He is full code.  Final Clinical Impressions(s) / ED Diagnoses   Final diagnoses:  Diarrhea, unspecified type  AKI (acute kidney injury) ([place])  Dehydration  Chemotherapy-induced neutropenia ([place])    New Prescriptions New Prescriptions   No medications on file   I personally preformed the services scribed in my presence. The recorded information has been reviewed is accurate. Virgel Manifold, MD.    Virgel Manifold, MD 10/06/2016 [phone]

## 2016-10-12 NOTE — ED Notes (Addendum)
Spouse at bedside- pt is on 3rd treatment of 21 treatments of chemo- He is supposed to meet his oncologist in the coming week- as they have not meet their cancer doctor at this time. Per her report Pt is insistent to go to bathroom and is assisted to toilet for bm and returned to bed.  He appears weak and upon return to bed speaksof his behind being painful and needing cleaning. His rectal area is reddened and excoriated with bleeding noted when cleaning. Mrs Brubacher reports that pt is on coumadin

## 2016-10-12 NOTE — ED Notes (Signed)
Dr Wilson Singer reviewing pt infor  From [facility] and in to assess pt

## 2016-10-12 NOTE — ED Notes (Signed)
Report to Sandra, RN

## 2016-10-12 NOTE — ED Triage Notes (Signed)
Ca patient sent from [facility] ED for neutropenia Patient has been evaluated and sent here for admission

## 2016-10-12 NOTE — ED Notes (Signed)
Per transfer note pt has completed his last dose of chemotherapy and complains of weakness N/V/D since last pm  has had a workup at [facility] and sent here for admission

## 2016-10-12 NOTE — H&P (Signed)
History and Physical    Derek Laughter XHB:[id number] DOB: 1951/05/18 DOA: 09/21/2016  Referring MD/NP/PA: Mila Merry, EDP PCP: Glenda Chroman, MD  Patient coming from: [facility] ER  Chief Complaint: Diarrhea, generalized weakness  HPI: Adam House is a 66 y.o. male history of lung cancer currently undergoing chemotherapy, chronic systolic CHF with an ejection fraction of 20-25% status post AICD placement among other multiple medical issues who went to [facility] ER today due to diarrhea and generalized weakness since his last chemotherapy treatment. He was apparently sent to our emergency department as his oncology care has been directed by Dr. Whitney Muse. On arrival he was hypotensive with a blood pressure systolic 70, labs showed a profound neutropenia with an ANC of 0, sodium of 129, acute on chronic kidney disease with a creatinine of 2.04 up from a baseline of around 1.3, he is negative for influenza A and B, no evidence of pneumonia on chest x-ray. He has responded nicely to IV fluids, was given a dose of vancomycin and Zosyn and admission has been requested. All of these lab values are obtained from records from outside hospital. He denies fevers and chills, temperature is 98.  Past Medical/Surgical History: Past Medical History:  Diagnosis Date  . Arthritis    gout  . Asthma   . Atrial fibrillation ([place])   . CAD (coronary artery disease)    a. ant MI 1998 with PCI to LAD. b. Ant-lat MI 04/2012 tx with overlapping DES to LAD.  [name] CHF (congestive heart failure) ([place])   . CRI (chronic renal insufficiency)   . CVA (cerebral infarction) 11/2010  . DVT (deep venous thrombosis) ([place])   . Esophageal reflux   . Gout   . Heart murmur   . Hypercholesteremia   . ICD (implantable cardiac defibrillator) in place   . Impotence of organic origin   . Ischemic cardiomyopathy    EF 25-30% by echo 04/2012; [place]  . Lung disease, interstitial ([place])   . Myocardial infarction 1998  and 2005  . Peripheral arterial disease ([place])   . Pneumonia    hx  . 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 Right internal carotid occlusion   . Stroke [facility]) ? 2006  . Systolic CHF (HCC)    NYHA Class II/III  . Tobacco use disorder   . Unspecified essential hypertension     Past Surgical History:  Procedure Laterality Date  . ANTERIOR CERVICAL DECOMP/DISCECTOMY FUSION  2007   "put in 4 screws to hold my head on; ruptured C5; job related injury" (08/24/2012)  . CARDIAC DEFIBRILLATOR PLACEMENT  2011   [employer]  he is a MADIT RIT study patient  . CORONARY ANGIOPLASTY WITH STENT PLACEMENT  05/1997; 04/2012   "1 + 2; total of 3" (08/24/2012)  . EMBOLECTOMY Right 06/24/2014   Procedure: RIGHT FEMORAL EMBOLECTOMY,  ;  Surgeon: Angelia Mould, MD;  Location: [facility] OR;  Service: Vascular;  Laterality: Right;  Right Femoral Embolectomy, with bovine patch angioplasty,.  . FASCIOTOMY Left 06/24/2014   Procedure: FASCIOTOMY;  Surgeon: Angelia Mould, MD;  Location: [place];  Service: Vascular;  Laterality: Left;  four compartment Fasciotomy.  [name] LEFT HEART CATHETERIZATION WITH CORONARY ANGIOGRAM N/A 04/22/2012   Procedure: LEFT HEART CATHETERIZATION WITH CORONARY ANGIOGRAM;  Surgeon: Leonie Man, MD;  Location: [facility] CATH LAB;  Service: Cardiovascular;  Laterality: N/A;  . PERCUTANEOUS CORONARY STENT INTERVENTION (PCI-S)  04/22/2012   Procedure: PERCUTANEOUS CORONARY STENT INTERVENTION (PCI-S);  Surgeon: Leonie Man, MD;  Location: [facility] CATH LAB;  Service: Cardiovascular;;  . VIDEO BRONCHOSCOPY WITH ENDOBRONCHIAL ULTRASOUND N/A 09/24/2016   Procedure: VIDEO BRONCHOSCOPY WITH ENDOBRONCHIAL ULTRASOUND;  Surgeon: Marshell Garfinkel, MD;  Location: [place];  Service: Pulmonary;  Laterality: N/A;    Social History:  reports that he quit smoking about 4 years ago. His smoking use included Cigarettes. He started smoking about 32 years ago. He has a 30.00 pack-year smoking history. His  smokeless tobacco use includes Snuff. He reports that he does not drink alcohol or use drugs.  Allergies: Allergies  Allergen Reactions  . Lisinopril Anaphylaxis and Swelling    angioedema  . Naproxen Hives  . Prednisone Nausea And Vomiting and Other (See Comments)    "I felt like my stomach was in knots, very painful"    Family History:  Family History  Problem Relation Age of Onset  . Diabetes Mother   . Cancer Mother     Ovarian  . Heart disease Mother   . Hyperlipidemia Mother   . Hypertension Mother   . Heart attack Mother   . Heart disease Father   . Hyperlipidemia Father   . Hypertension Father   . Heart attack Father   . Heart disease Brother   . Heart attack Brother   . Cancer Sister     Ovarian-Liver-Lung-Brain  . Heart disease Sister   . Heart attack Daughter   . Heart disease Daughter     Before age 70  . Allergies Daughter   . Cancer Sister     Lung  . Heart disease Sister   . Emphysema Paternal Grandfather     Prior to Admission medications   Medication Sig Start Date End Date Taking? Authorizing Provider  albuterol (PROVENTIL HFA;VENTOLIN HFA) 108 (90 Base) MCG/ACT inhaler Inhale 2 puffs into the lungs every 6 (six) hours as needed for wheezing or shortness of breath. 05/13/16  Yes Herminio Commons, MD  allopurinol (ZYLOPRIM) 300 MG tablet Take 300 mg by mouth daily. 08/25/15  Yes Historical Provider, MD  ALPRAZolam Duanne Moron) 0.5 MG tablet Take 0.5 mg by mouth 2 (two) times daily as needed for anxiety.   Yes Historical Provider, MD  colchicine 0.6 MG tablet Take 0.6 mg by mouth daily.   Yes Historical Provider, MD  ETOPOSIDE IV Inject into the vein. Days 1-3 every 21 days   Yes Historical Provider, MD  furosemide (LASIX) 20 MG tablet Take 1 tablet (20 mg total) by mouth daily. 11/13/15  Yes Herminio Commons, MD  ipratropium (ATROVENT) 0.02 % nebulizer solution Take 0.5 mg by nebulization every 6 (six) hours as needed for wheezing or shortness of  breath.   Yes Historical Provider, MD  isosorbide mononitrate (IMDUR) 30 MG 24 hr tablet Take 1 tablet (30 mg total) by mouth daily. 10/24/15  Yes Herminio Commons, MD  omeprazole (PRILOSEC) 20 MG capsule Take 20 mg by mouth daily. 11/30/15  Yes Historical Provider, MD  simvastatin (ZOCOR) 10 MG tablet Take 1 tablet (10 mg total) by mouth at bedtime. 09/24/15  Yes Herminio Commons, MD  sotalol (BETAPACE) 120 MG tablet Take 120 mg by mouth 2 (two) times daily. 10/01/15  Yes Historical Provider, MD  ULORIC 40 MG tablet Take 1 tablet by mouth daily. 01/02/16  Yes Historical Provider, MD  NITROSTAT 0.4 MG SL tablet PLACE 1 TABLET UNDER TONGUE EVERY 5 MINUTES AS NEEDED FOR CHEST PAIN (UP TO 3 DOSES) 03/12/16   Herminio Commons,  MD  ondansetron (ZOFRAN) 8 MG tablet Take 1 tablet (8 mg total) by mouth every 8 (eight) hours as needed for nausea or vomiting. 10/01/16   Patrici Ranks, MD  Pegfilgrastim (NEULASTA ONPRO [place]) Inject into the skin. Every 21 days - to be administered 27 hours post chemo completion    Historical Provider, MD  potassium chloride (K-DUR) 10 MEQ tablet Take 10 mEq by mouth 2 (two) times daily.    Historical Provider, MD  prochlorperazine (COMPAZINE) 10 MG tablet Take 1 tablet (10 mg total) by mouth every 6 (six) hours as needed for nausea or vomiting. 10/01/16   Patrici Ranks, MD  warfarin (COUMADIN) 5 MG tablet Take 1 tablet (5 mg total) by mouth daily. 09/26/14   Thompson Grayer, MD    Review of Systems:  Constitutional: Denies fever, chills, diaphoresis, Positive for appetite change and fatigue.  HEENT: Denies photophobia, eye pain, redness, hearing loss, ear pain, congestion, sore throat, rhinorrhea, sneezing, mouth sores, trouble swallowing, neck pain, neck stiffness and tinnitus.   Respiratory: Denies SOB, DOE, cough, chest tightness,  and wheezing.   Cardiovascular: Denies chest pain, palpitations and leg swelling.  Gastrointestinal: Denies nausea, vomiting, abdominal pain,  , constipation, blood in stool and abdominal distention.  Genitourinary: Denies dysuria, urgency, frequency, hematuria, flank pain and difficulty urinating.  Endocrine: Denies: hot or cold intolerance, sweats, changes in hair or nails, polyuria, polydipsia. Musculoskeletal: Denies myalgias, back pain, joint swelling, arthralgias and gait problem.  Skin: Denies pallor, rash and wound.  Neurological: Denies dizziness, seizures, syncope, weakness, light-headedness, numbness and headaches.  Hematological: Denies adenopathy. Easy bruising, personal or family bleeding history  Psychiatric/Behavioral: Denies suicidal ideation, mood changes, confusion, nervousness, sleep disturbance and agitation    Physical Exam: Vitals:   09/22/2016 1430 10/11/2016 1518 09/17/2016 1531 10/02/2016 1539  BP: (!) 92/50 (!) 81/55 105/92 105/92  Pulse: 91 88  (!) 43  Resp: 24 18 (!) 28 19  Temp:    98 F (36.7 C)  TempSrc:    Oral  SpO2: 96% 93%  (!) 85%  Weight:      Height:          Constitutional: NAD, calm, comfortable Eyes: PERRL, lids and conjunctivae normal ENMT: Mucous membranes are Very dry. Posterior pharynx clear of any exudate or lesions.Normal dentition.  Neck: normal, supple, no masses, no thyromegaly Respiratory: clear to auscultation bilaterally, no wheezing, no crackles. Normal respiratory effort. No accessory muscle use.  Cardiovascular: Regular rate and rhythm, no murmurs / rubs / gallops. No extremity edema. 2+ pedal pulses. No carotid bruits.  Abdomen: no tenderness, no masses palpated. No hepatosplenomegaly. Bowel sounds positive.  Musculoskeletal: no clubbing / cyanosis. No joint deformity upper and lower extremities. Good ROM, no contractures. Normal muscle tone.  Skin: no rashes, lesions, ulcers. No induration Neurologic: CN 2-12 grossly intact. Sensation intact, DTR normal. Strength 5/5 in all 4.  Psychiatric: Normal judgment and insight. Alert and oriented x 3. Normal mood.    Labs on  Admission: I have personally reviewed the following labs and imaging studies  CBC: No results for input(s): WBC, NEUTROABS, HGB, HCT, MCV, PLT in the last 168 hours. Basic Metabolic Panel: No results for input(s): NA, K, CL, CO2, GLUCOSE, BUN, CREATININE, CALCIUM, MG, PHOS in the last 168 hours. GFR: Estimated Creatinine Clearance: 45.2 mL/min (by C-G formula based on SCr of 1.63 mg/dL (H)). Liver Function Tests: No results for input(s): AST, ALT, ALKPHOS, BILITOT, PROT, ALBUMIN in the last 168 hours. No  results for input(s): LIPASE, AMYLASE in the last 168 hours. No results for input(s): AMMONIA in the last 168 hours. Coagulation Profile: No results for input(s): INR, PROTIME in the last 168 hours. Cardiac Enzymes: No results for input(s): CKTOTAL, CKMB, CKMBINDEX, TROPONINI in the last 168 hours. BNP (last 3 results) No results for input(s): PROBNP in the last 8760 hours. HbA1C: No results for input(s): HGBA1C in the last 72 hours. CBG: No results for input(s): GLUCAP in the last 168 hours. Lipid Profile: No results for input(s): CHOL, HDL, LDLCALC, TRIG, CHOLHDL, LDLDIRECT in the last 72 hours. Thyroid Function Tests: No results for input(s): TSH, T4TOTAL, FREET4, T3FREE, THYROIDAB in the last 72 hours. Anemia Panel: No results for input(s): VITAMINB12, FOLATE, FERRITIN, TIBC, IRON, RETICCTPCT in the last 72 hours. Urine analysis: No results found for: COLORURINE, APPEARANCEUR, LABSPEC, PHURINE, GLUCOSEU, HGBUR, BILIRUBINUR, KETONESUR, PROTEINUR, UROBILINOGEN, NITRITE, LEUKOCYTESUR Sepsis Labs: '@LABRCNTIP'$ (procalcitonin:4,lacticidven:4) )No results found for this or any previous visit (from the past 240 hour(s)).   Radiological Exams on Admission: No results found.  EKG: Independently reviewed. None available for interpretation  Assessment/Plan Active Problems:   Dyslipidemia   Essential hypertension, benign   Implantable cardioverter-defibrillator (ICD) in situ    Chronic kidney disease (CKD), stage II (mild)   Chronic systolic CHF (congestive heart failure) (HCC)   Diarrhea   SIRS (systemic inflammatory response syndrome) (HCC)   Leukopenia    Sirs -No evidence for active infection, suspect all of this is due to profound GI losses from recent chemotherapy with resultant lactic acidosis and dehydration. -Already improved after receiving 2-1/2 L of IV fluids in the ED. -Will continue fluids at 125 mL an hour for the next 12 hours only keeping in mind he has a very reduced ejection fraction. -Recheck lactic acid in a.m. -See no need for antibiotics at present and will discontinue pending results of cultures.  Neutropenia -As result of chemotherapy. -Will give Neupogen daily until counts recover.  Diarrhea -Likely secondary to chemotherapy, nonetheless given his immune suppressed state will check C. difficile PCR.  Acute on chronic kidney disease stage II-III -Baseline creatinine between 1.1 and 1.3, creatinine 2.04 on admission, should improve with IV fluids. -Recheck renal function in a.m.  Chronic systolic CHF -With EF of 24-82%, status post AICD. -He is currently volume depleted, IV fluids will be given in antihypertensive agents are being held..  Hyperlipidemia -Continue statin.  Peripheral vascular disease/carotid stenosis -Continue statin, continue warfarin, followed outpatient by vascular -History of chronic right ICA occlusion with mild to moderate left ICA disease.  History of ventricular tachycardia -On sotalol as an outpatient, currently being held given hypotension on admission..   DVT prophylaxis: Warfarin  Code Status: Full code  Family Communication: Patient only  Disposition Plan: Anticipate discharge home once medically ready, suspect at least 48 hours  Consults called: None  Admission status: Inpatient    Time Spent: 80 minutes  Lelon Frohlich MD [facility] Pager [phone]  If 7PM-7AM,  please contact night-coverage [email or web address] Password [facility]  09/25/2016, 4:49 PM

## 2016-10-13 ENCOUNTER — Inpatient Hospital Stay (HOSPITAL_COMMUNITY): Payer: Medicare Other

## 2016-10-13 ENCOUNTER — Other Ambulatory Visit: Payer: Self-pay

## 2016-10-13 ENCOUNTER — Other Ambulatory Visit (HOSPITAL_COMMUNITY): Payer: Medicare Other

## 2016-10-13 ENCOUNTER — Other Ambulatory Visit (HOSPITAL_COMMUNITY): Payer: Self-pay | Admitting: Oncology

## 2016-10-13 ENCOUNTER — Ambulatory Visit (HOSPITAL_COMMUNITY): Payer: Medicare Other | Admitting: Oncology

## 2016-10-13 DIAGNOSIS — N182 Chronic kidney disease, stage 2 (mild): Secondary | ICD-10-CM

## 2016-10-13 DIAGNOSIS — I469 Cardiac arrest, cause unspecified: Secondary | ICD-10-CM

## 2016-10-13 DIAGNOSIS — T451X5A Adverse effect of antineoplastic and immunosuppressive drugs, initial encounter: Secondary | ICD-10-CM

## 2016-10-13 DIAGNOSIS — I5022 Chronic systolic (congestive) heart failure: Secondary | ICD-10-CM

## 2016-10-13 DIAGNOSIS — N179 Acute kidney failure, unspecified: Secondary | ICD-10-CM

## 2016-10-13 DIAGNOSIS — D701 Agranulocytosis secondary to cancer chemotherapy: Secondary | ICD-10-CM

## 2016-10-13 LAB — BLOOD GAS, ARTERIAL
ACID-BASE DEFICIT: 14.6 mmol/L — AB (ref 0.0–2.0)
Acid-Base Excess: 15.7 mmol/L — ABNORMAL HIGH (ref 0.0–2.0)
BICARBONATE: 11.4 mmol/L — AB (ref 20.0–28.0)
BICARBONATE: 12.9 mmol/L — AB (ref 20.0–28.0)
Bicarbonate: 8 mmol/L — ABNORMAL LOW (ref 20.0–28.0)
DRAWN BY: 317771
DRAWN BY: 317771
Drawn by: 317771
FIO2: 1
FIO2: 1
FIO2: 100
LHR: 16 {breaths}/min
LHR: 18 {breaths}/min
LHR: 18 {breaths}/min
MECHVT: 510 mL
MECHVT: 510 mL
MECHVT: 510 mL
O2 Content: 100 L/min
O2 Content: 100 L/min
O2 SAT: 64.4 %
O2 Saturation: 98 %
PATIENT TEMPERATURE: 37
PCO2 ART: 53.7 mmHg — AB (ref 32.0–48.0)
PEEP/CPAP: 5 cmH2O
PEEP/CPAP: 5 cmH2O
PEEP/CPAP: 5 cmH2O
PH ART: 6.942 — AB (ref 7.350–7.450)
PO2 ART: 58 mmHg — AB (ref 83.0–108.0)
PO2 ART: 37.8 mmHg — AB (ref 83.0–108.0)
PO2 ART: 164 mmHg — AB (ref 83.0–108.0)
pCO2 arterial: 41.3 mmHg (ref 32.0–48.0)
pCO2 arterial: 31.1 mmHg — ABNORMAL LOW (ref 32.0–48.0)
pH, Arterial: 7.022 — CL (ref 7.350–7.450)
pH, Arterial: 7.204 — ABNORMAL LOW (ref 7.350–7.450)

## 2016-10-13 LAB — GLUCOSE, CAPILLARY
GLUCOSE-CAPILLARY: 12 mg/dL — AB (ref 65–99)
GLUCOSE-CAPILLARY: 18 mg/dL — AB (ref 65–99)
GLUCOSE-CAPILLARY: 23 mg/dL — AB (ref 65–99)
GLUCOSE-CAPILLARY: 31 mg/dL — AB (ref 65–99)
GLUCOSE-CAPILLARY: 350 mg/dL — AB (ref 65–99)
GLUCOSE-CAPILLARY: 364 mg/dL — AB (ref 65–99)
Glucose-Capillary: 185 mg/dL — ABNORMAL HIGH (ref 65–99)
Glucose-Capillary: 452 mg/dL — ABNORMAL HIGH (ref 65–99)
Glucose-Capillary: 347 mg/dL — ABNORMAL HIGH (ref 65–99)
Glucose-Capillary: 134 mg/dL — ABNORMAL HIGH (ref 65–99)
Glucose-Capillary: 119 mg/dL — ABNORMAL HIGH (ref 65–99)
Glucose-Capillary: 70 mg/dL (ref 65–99)
Glucose-Capillary: <10 mg/dL — CL (ref 65–99)

## 2016-10-13 LAB — COMPREHENSIVE METABOLIC PANEL
ALK PHOS: 26 U/L — AB (ref 38–126)
ALT: 687 U/L — AB (ref 17–63)
ALT: 1029 U/L — ABNORMAL HIGH (ref 17–63)
ANION GAP: 18 — AB (ref 5–15)
AST: 915 U/L — ABNORMAL HIGH (ref 15–41)
AST: 2200 U/L — ABNORMAL HIGH (ref 15–41)
Albumin: 1.2 g/dL — ABNORMAL LOW (ref 3.5–5.0)
Albumin: 1.2 g/dL — ABNORMAL LOW (ref 3.5–5.0)
Alkaline Phosphatase: 25 U/L — ABNORMAL LOW (ref 38–126)
Anion gap: 16 — ABNORMAL HIGH (ref 5–15)
BILIRUBIN TOTAL: 0.6 mg/dL (ref 0.3–1.2)
BUN: 46 mg/dL — ABNORMAL HIGH (ref 6–20)
BUN: 46 mg/dL — ABNORMAL HIGH (ref 6–20)
CHLORIDE: 101 mmol/L (ref 101–111)
CO2: 11 mmol/L — ABNORMAL LOW (ref 22–32)
CO2: 14 mmol/L — AB (ref 22–32)
CREATININE: 3.07 mg/dL — AB (ref 0.61–1.24)
Calcium: 7.5 mg/dL — ABNORMAL LOW (ref 8.9–10.3)
Calcium: 6.5 mg/dL — ABNORMAL LOW (ref 8.9–10.3)
Chloride: 95 mmol/L — ABNORMAL LOW (ref 101–111)
Creatinine, Ser: 3.16 mg/dL — ABNORMAL HIGH (ref 0.61–1.24)
GFR calc Af Amer: 23 mL/min — ABNORMAL LOW (ref >60)
GFR calc non Af Amer: 19 mL/min — ABNORMAL LOW (ref >60)
GFR, EST AFRICAN AMERICAN: 22 mL/min — AB (ref >60)
GFR, EST NON AFRICAN AMERICAN: 20 mL/min — AB (ref >60)
GLUCOSE: 1168 mg/dL — AB (ref 65–99)
Glucose, Bld: 558 mg/dL (ref 65–99)
POTASSIUM: 5.5 mmol/L — AB (ref 3.5–5.1)
Potassium: 6.4 mmol/L (ref 3.5–5.1)
SODIUM: 133 mmol/L — AB (ref 135–145)
Sodium: 122 mmol/L — ABNORMAL LOW (ref 135–145)
Total Bilirubin: 1 mg/dL (ref 0.3–1.2)
Total Protein: <3 g/dL — ABNORMAL LOW (ref 6.5–8.1)

## 2016-10-13 LAB — URINALYSIS, MICROSCOPIC (REFLEX)

## 2016-10-13 LAB — CBC
HCT: 33.9 % — ABNORMAL LOW (ref 39.0–52.0)
HEMATOCRIT: 17.8 % — AB (ref 39.0–52.0)
HEMOGLOBIN: 6.1 g/dL — AB (ref 13.0–17.0)
Hemoglobin: 12.2 g/dL — ABNORMAL LOW (ref 13.0–17.0)
MCH: 27.6 pg (ref 26.0–34.0)
MCH: 28.7 pg (ref 26.0–34.0)
MCHC: 34.3 g/dL (ref 30.0–36.0)
MCHC: 36 g/dL (ref 30.0–36.0)
MCV: 80.5 fL (ref 78.0–100.0)
MCV: 79.8 fL (ref 78.0–100.0)
PLATELETS: 43 10*3/uL — AB (ref 150–400)
Platelets: 18 10*3/uL — CL (ref 150–400)
RBC: 2.21 MIL/uL — AB (ref 4.22–5.81)
RBC: 4.25 MIL/uL (ref 4.22–5.81)
RDW: 15.2 % (ref 11.5–15.5)
RDW: 14.5 % (ref 11.5–15.5)
WBC: 0.5 10*3/uL — CL (ref 4.0–10.5)
WBC: 0.4 10*3/uL — AB (ref 4.0–10.5)

## 2016-10-13 LAB — CBC WITH DIFFERENTIAL/PLATELET
Basophils Absolute: 0 10*3/uL (ref 0.0–0.1)
Basophils Relative: 0 %
Eosinophils Absolute: 0 10*3/uL (ref 0.0–0.7)
Eosinophils Relative: 0 %
HEMATOCRIT: 16.9 % — AB (ref 39.0–52.0)
HEMOGLOBIN: 5.8 g/dL — AB (ref 13.0–17.0)
LYMPHS ABS: 0.8 10*3/uL (ref 0.7–4.0)
Lymphocytes Relative: 96 %
MCH: 27.9 pg (ref 26.0–34.0)
MCHC: 34.3 g/dL (ref 30.0–36.0)
MCV: 81.3 fL (ref 78.0–100.0)
Monocytes Absolute: 0 10*3/uL — ABNORMAL LOW (ref 0.1–1.0)
Monocytes Relative: 1 %
Neutro Abs: 0 10*3/uL — ABNORMAL LOW (ref 1.7–7.7)
Neutrophils Relative %: 3 %
Platelets: 14 10*3/uL — CL (ref 150–400)
RBC: 2.08 MIL/uL — AB (ref 4.22–5.81)
RDW: 15.5 % (ref 11.5–15.5)
WBC: 0.8 10*3/uL — CL (ref 4.0–10.5)

## 2016-10-13 LAB — URINALYSIS, ROUTINE W REFLEX MICROSCOPIC
BILIRUBIN URINE: NEGATIVE
Glucose, UA: NEGATIVE mg/dL
KETONES UR: NEGATIVE mg/dL
Leukocytes, UA: NEGATIVE
NITRITE: NEGATIVE
PH: 5.5 (ref 5.0–8.0)
SPECIFIC GRAVITY, URINE: 1.015 (ref 1.005–1.030)

## 2016-10-13 LAB — BASIC METABOLIC PANEL
ANION GAP: 20 — AB (ref 5–15)
ANION GAP: 19 — AB (ref 5–15)
BUN: 47 mg/dL — AB (ref 6–20)
BUN: 47 mg/dL — ABNORMAL HIGH (ref 6–20)
CALCIUM: 6.7 mg/dL — AB (ref 8.9–10.3)
CALCIUM: 6.4 mg/dL — AB (ref 8.9–10.3)
CHLORIDE: 102 mmol/L (ref 101–111)
CO2: 17 mmol/L — ABNORMAL LOW (ref 22–32)
CO2: 18 mmol/L — ABNORMAL LOW (ref 22–32)
CREATININE: 3.8 mg/dL — AB (ref 0.61–1.24)
CREATININE: 3.78 mg/dL — AB (ref 0.61–1.24)
Chloride: 101 mmol/L (ref 101–111)
GFR calc Af Amer: 18 mL/min — ABNORMAL LOW (ref >60)
GFR calc Af Amer: 18 mL/min — ABNORMAL LOW (ref >60)
GFR calc non Af Amer: 15 mL/min — ABNORMAL LOW (ref >60)
GFR, EST NON AFRICAN AMERICAN: 15 mL/min — AB (ref >60)
GLUCOSE: 114 mg/dL — AB (ref 65–99)
GLUCOSE: 91 mg/dL (ref 65–99)
Potassium: 3.8 mmol/L (ref 3.5–5.1)
Potassium: 4.4 mmol/L (ref 3.5–5.1)
Sodium: 138 mmol/L (ref 135–145)
Sodium: 139 mmol/L (ref 135–145)

## 2016-10-13 LAB — LACTIC ACID, PLASMA
LACTIC ACID, VENOUS: 11.6 mmol/L — AB (ref 0.5–1.9)
LACTIC ACID, VENOUS: 12.6 mmol/L — AB (ref 0.5–1.9)
Lactic Acid, Venous: 11.4 mmol/L (ref 0.5–1.9)

## 2016-10-13 LAB — MAGNESIUM: Magnesium: 1.8 mg/dL (ref 1.7–2.4)

## 2016-10-13 LAB — ABO/RH: ABO/RH(D): A POS

## 2016-10-13 LAB — FIBRINOGEN: Fibrinogen: 401 mg/dL (ref 210–475)

## 2016-10-13 LAB — PROTIME-INR
INR: 4.09
Prothrombin Time: 40.9 seconds — ABNORMAL HIGH (ref 11.4–15.2)

## 2016-10-13 LAB — PREPARE RBC (CROSSMATCH)

## 2016-10-13 LAB — GLUCOSE, RANDOM: GLUCOSE: 202 mg/dL — AB (ref 65–99)

## 2016-10-13 LAB — PHOSPHORUS
PHOSPHORUS: 6.2 mg/dL — AB (ref 2.5–4.6)
Phosphorus: 11.2 mg/dL — ABNORMAL HIGH (ref 2.5–4.6)

## 2016-10-13 LAB — MRSA PCR SCREENING: MRSA by PCR: NEGATIVE

## 2016-10-13 LAB — URIC ACID: URIC ACID, SERUM: 4.3 mg/dL — AB (ref 4.4–7.6)

## 2016-10-13 LAB — CORTISOL: CORTISOL PLASMA: 88.5 ug/dL

## 2016-10-13 MED ORDER — MORPHINE SULFATE (PF) 4 MG/ML IV SOLN
INTRAVENOUS | Status: AC
  Filled 2016-10-13: qty 1

## 2016-10-13 MED ORDER — SODIUM BICARBONATE 8.4 % IV SOLN
INTRAVENOUS | Status: DC
  Administered 2016-10-13: 03:00:00 via INTRAVENOUS
  Filled 2016-10-13 (×3): qty 150

## 2016-10-13 MED ORDER — SODIUM CHLORIDE 0.9 % IV SOLN
0.0000 ug/min | INTRAVENOUS | Status: DC
  Administered 2016-10-13: 220 ug/min via INTRAVENOUS
  Administered 2016-10-13 (×2): 200 ug/min via INTRAVENOUS
  Administered 2016-10-13 – 2016-10-14 (×5): 220 ug/min via INTRAVENOUS
  Filled 2016-10-13 (×5): qty 4

## 2016-10-13 MED ORDER — SODIUM BICARBONATE 8.4 % IV SOLN
100.0000 meq | Freq: Once | INTRAVENOUS | Status: AC
Start: 2016-10-13 — End: 2016-10-13
  Administered 2016-10-13: 100 meq via INTRAVENOUS

## 2016-10-13 MED ORDER — PIPERACILLIN-TAZOBACTAM 3.375 G IVPB
3.3750 g | Freq: Three times a day (TID) | INTRAVENOUS | Status: DC
  Administered 2016-10-13 – 2016-10-14 (×3): 3.375 g via INTRAVENOUS
  Filled 2016-10-13 (×2): qty 50

## 2016-10-13 MED ORDER — DOPAMINE-DEXTROSE 3.2-5 MG/ML-% IV SOLN
0.0000 ug/kg/min | INTRAVENOUS | Status: DC
  Administered 2016-10-13 (×2): 20 ug/kg/min via INTRAVENOUS
  Filled 2016-10-13 (×2): qty 250

## 2016-10-13 MED ORDER — PHENYLEPHRINE HCL 10 MG/ML IJ SOLN
INTRAMUSCULAR | Status: AC
  Filled 2016-10-13: qty 4

## 2016-10-13 MED ORDER — DEXTROSE 50 % IV SOLN
INTRAVENOUS | Status: AC
  Administered 2016-10-13: 50 mL via INTRAVENOUS
  Filled 2016-10-13: qty 50

## 2016-10-13 MED ORDER — SIMVASTATIN 20 MG PO TABS: 10.0000 mg | ORAL_TABLET | Freq: Every day | ORAL | Status: DC

## 2016-10-13 MED ORDER — AMIODARONE IV BOLUS ONLY 150 MG/100ML
INTRAVENOUS | Status: AC
  Filled 2016-10-13: qty 100

## 2016-10-13 MED ORDER — PANTOPRAZOLE SODIUM 40 MG IV SOLR
40.0000 mg | INTRAVENOUS | Status: DC
  Administered 2016-10-13 – 2016-10-14 (×2): 40 mg via INTRAVENOUS
  Filled 2016-10-13 (×2): qty 40

## 2016-10-13 MED ORDER — LORAZEPAM 2 MG/ML IJ SOLN
INTRAMUSCULAR | Status: AC
  Filled 2016-10-13: qty 1

## 2016-10-13 MED ORDER — DEXTROSE 50 % IV SOLN
INTRAVENOUS | Status: AC
  Filled 2016-10-13: qty 50

## 2016-10-13 MED ORDER — MORPHINE SULFATE (PF) 4 MG/ML IV SOLN
4.0000 mg | Freq: Once | INTRAVENOUS | Status: AC
  Administered 2016-10-13: 4 mg via INTRAVENOUS

## 2016-10-13 MED ORDER — SODIUM BICARBONATE 8.4 % IV SOLN
INTRAVENOUS | Status: DC
  Administered 2016-10-13 – 2016-10-14 (×2): via INTRAVENOUS
  Filled 2016-10-13 (×4): qty 1000

## 2016-10-13 MED ORDER — PHENYLEPHRINE HCL 10 MG/ML IJ SOLN
INTRAMUSCULAR | Status: AC
  Filled 2016-10-13: qty 1

## 2016-10-13 MED ORDER — NOREPINEPHRINE BITARTRATE 1 MG/ML IV SOLN
INTRAVENOUS | Status: AC
  Filled 2016-10-13: qty 16

## 2016-10-13 MED ORDER — SODIUM BICARBONATE 8.4 % IV SOLN
50.0000 meq | Freq: Once | INTRAVENOUS | Status: AC
  Administered 2016-10-13: 50 meq via INTRAVENOUS

## 2016-10-13 MED ORDER — SODIUM BICARBONATE 8.4 % IV SOLN
INTRAVENOUS | Status: AC
  Filled 2016-10-13: qty 100

## 2016-10-13 MED ORDER — DEXTROSE 50 % IV SOLN
INTRAVENOUS | Status: AC
Start: 2016-10-13 — End: 2016-10-13
  Filled 2016-10-13: qty 100

## 2016-10-13 MED ORDER — HYDROCORTISONE NA SUCCINATE PF 100 MG IJ SOLR
50.0000 mg | Freq: Three times a day (TID) | INTRAMUSCULAR | Status: DC
  Administered 2016-10-13 – 2016-10-14 (×4): 50 mg via INTRAVENOUS
  Filled 2016-10-13 (×4): qty 2

## 2016-10-13 MED ORDER — SODIUM BICARBONATE 8.4 % IV SOLN
INTRAVENOUS | Status: DC
  Filled 2016-10-13: qty 100

## 2016-10-13 MED ORDER — AMIODARONE HCL IN DEXTROSE 360-4.14 MG/200ML-% IV SOLN
INTRAVENOUS | Status: AC
  Filled 2016-10-13: qty 200

## 2016-10-13 MED ORDER — PIPERACILLIN-TAZOBACTAM 3.375 G IVPB
3.3750 g | Freq: Once | INTRAVENOUS | Status: AC
  Administered 2016-10-13: 3.375 g via INTRAVENOUS
  Filled 2016-10-13: qty 50

## 2016-10-13 MED ORDER — SODIUM BICARBONATE 8.4 % IV SOLN
INTRAVENOUS | Status: AC
  Filled 2016-10-13: qty 150

## 2016-10-13 MED ORDER — LORAZEPAM 2 MG/ML IJ SOLN
2.0000 mg | Freq: Once | INTRAMUSCULAR | Status: AC
  Administered 2016-10-13: 2 mg via INTRAVENOUS

## 2016-10-13 MED ORDER — VANCOMYCIN HCL IN DEXTROSE 1-5 GM/200ML-% IV SOLN
1000.0000 mg | INTRAVENOUS | Status: DC
  Administered 2016-10-13: 1000 mg via INTRAVENOUS
  Filled 2016-10-13: qty 200

## 2016-10-13 MED ORDER — DEXTROSE 50 % IV SOLN
1.0000 | Freq: Once | INTRAVENOUS | Status: AC
  Administered 2016-10-13: 50 mL via INTRAVENOUS

## 2016-10-13 MED ORDER — SODIUM CHLORIDE 0.9 % IV SOLN
0.0000 ug/min | INTRAVENOUS | Status: DC
  Filled 2016-10-13: qty 1

## 2016-10-13 MED ORDER — SODIUM CHLORIDE 0.9 % IV SOLN
1.0000 g | Freq: Once | INTRAVENOUS | Status: DC
  Filled 2016-10-13: qty 10

## 2016-10-13 MED ORDER — SODIUM BICARBONATE 8.4 % IV SOLN
INTRAVENOUS | Status: AC
Start: 2016-10-13 — End: 2016-10-13
  Filled 2016-10-13: qty 100

## 2016-10-13 MED ORDER — VANCOMYCIN HCL IN DEXTROSE 1-5 GM/200ML-% IV SOLN
1000.0000 mg | Freq: Once | INTRAVENOUS | Status: AC
  Administered 2016-10-13: 1000 mg via INTRAVENOUS
  Filled 2016-10-13: qty 200

## 2016-10-13 MED ORDER — POLYVINYL ALCOHOL 1.4 % OP SOLN
1.0000 [drp] | OPHTHALMIC | Status: DC | PRN
  Administered 2016-10-13: 1 [drp] via OPHTHALMIC
  Filled 2016-10-13 (×2): qty 15

## 2016-10-13 MED ORDER — NOREPINEPHRINE BITARTRATE 1 MG/ML IV SOLN
0.0000 ug/min | INTRAVENOUS | Status: DC
  Administered 2016-10-13: 50 ug/min via INTRAVENOUS
  Administered 2016-10-13 (×2): 40 ug/min via INTRAVENOUS
  Administered 2016-10-13: 50 ug/min via INTRAVENOUS
  Administered 2016-10-14 (×2): 40 ug/min via INTRAVENOUS
  Filled 2016-10-13 (×4): qty 16

## 2016-10-13 MED ORDER — MORPHINE SULFATE (PF) 2 MG/ML IV SOLN
2.0000 mg | INTRAVENOUS | Status: DC | PRN
  Administered 2016-10-13 – 2016-10-14 (×4): 2 mg via INTRAVENOUS
  Filled 2016-10-13 (×4): qty 1

## 2016-10-13 MED ORDER — DEXTROSE 50 % IV SOLN
INTRAVENOUS | Status: AC
  Administered 2016-10-13: 50 mL
  Filled 2016-10-13: qty 50

## 2016-10-13 MED ORDER — DOPAMINE-DEXTROSE 3.2-5 MG/ML-% IV SOLN
INTRAVENOUS | Status: AC
  Administered 2016-10-13: 20 ug/kg/min via INTRAVENOUS
  Filled 2016-10-13: qty 250

## 2016-10-13 MED ORDER — SODIUM CHLORIDE 0.9 % IV SOLN
Freq: Once | INTRAVENOUS | Status: AC
  Administered 2016-10-13: 03:00:00 via INTRAVENOUS

## 2016-10-13 MED FILL — Medication: Qty: 1 | Status: AC

## 2016-10-13 NOTE — Progress Notes (Signed)
Pt had been complaining of pain since the start of my shift. He asked if he could have something. I adv because his BP readings have been so low I adv against it. I kept checking on his BP. I even attempted at get a manual reading with no luck. I notified the MD and was ordered to bolus 265m. BP went up slightly. Pt asked again for pain medicine but also asked for a Xanax. I gave medication with ginger ale. He swallowed medication and asked for more ginger ale. I offered more and he took a sip. I turned my back to put the drink away and when I turned to face him, he had gone pale in the face and began breathing very rapidly. I asked him if he was ok but he did not respond. He become motionless. Code Blue was initiated.

## 2016-10-13 NOTE — ED Provider Notes (Signed)
[place]  Department of Emergency Medicine   Code Blue CONSULT NOTE  Chief Complaint: Cardiac arrest/unresponsive   Level V Caveat: Unresponsive  History of present illness: I was contacted by the hospital for a CODE BLUE cardiac arrest upstairs and presented to the patient's bedside.   Called to beside for code blue. Patient became unresponsive after drinking fluids and taking xanax. Found to be in PEA. Pt was awake and talking. He was given Xanax followed by ginger ale when he became unresponsive. No pulse could be found.   ROS: Unable to obtain, Level V caveat  Scheduled Meds: . allopurinol  300 mg Oral Daily  . dextrose      . dextrose      . dextrose      . dextrose      . febuxostat  40 mg Oral Daily  . feeding supplement  1 Container Oral TID BM  . feeding supplement (ENSURE ENLIVE)  237 mL Oral BID BM  . pantoprazole  40 mg Oral Daily  . simvastatin  10 mg Oral QHS  . warfarin  5 mg Oral Once  . Warfarin - Pharmacist Dosing Inpatient   Does not apply Q24H   Continuous Infusions: . sodium chloride    . sodium chloride     PRN Meds:.acetaminophen **OR** acetaminophen, ALPRAZolam, ipratropium-albuterol, nitroGLYCERIN, ondansetron **OR** ondansetron (ZOFRAN) IV, senna-docusate, traMADol Past Medical History:  Diagnosis Date  . Arthritis    gout  . Asthma   . Atrial fibrillation ([place])   . CAD (coronary artery disease)    a. ant MI 1998 with PCI to LAD. b. Ant-lat MI 04/2012 tx with overlapping DES to LAD.  [name] CHF (congestive heart failure) ([place])   . CRI (chronic renal insufficiency)   . CVA (cerebral infarction) 11/2010  . DVT (deep venous thrombosis) ([place])   . Esophageal reflux   . Gout   . Heart murmur   . Hypercholesteremia   . ICD (implantable cardiac defibrillator) in place   . Impotence of organic origin   . Ischemic cardiomyopathy    EF 25-30% by echo 04/2012; [place]  . Lung disease, interstitial ([place])   . Myocardial  infarction 1998 and 2005  . Peripheral arterial disease ([place])   . Pneumonia    hx  . 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 Right internal carotid occlusion   . Stroke [facility]) ? 2006  . Systolic CHF (HCC)    NYHA Class II/III  . Tobacco use disorder   . Unspecified essential hypertension    Past Surgical History:  Procedure Laterality Date  . ANTERIOR CERVICAL DECOMP/DISCECTOMY FUSION  2007   "put in 4 screws to hold my head on; ruptured C5; job related injury" (08/24/2012)  . CARDIAC DEFIBRILLATOR PLACEMENT  2011   [employer]  he is a MADIT RIT study patient  . CORONARY ANGIOPLASTY WITH STENT PLACEMENT  05/1997; 04/2012   "1 + 2; total of 3" (08/24/2012)  . EMBOLECTOMY Right 06/24/2014   Procedure: RIGHT FEMORAL EMBOLECTOMY,  ;  Surgeon: Angelia Mould, MD;  Location: [facility] OR;  Service: Vascular;  Laterality: Right;  Right Femoral Embolectomy, with bovine patch angioplasty,.  . FASCIOTOMY Left 06/24/2014   Procedure: FASCIOTOMY;  Surgeon: Angelia Mould, MD;  Location: [place];  Service: Vascular;  Laterality: Left;  four compartment Fasciotomy.  [name] LEFT HEART CATHETERIZATION WITH CORONARY ANGIOGRAM N/A 04/22/2012   Procedure: LEFT HEART CATHETERIZATION WITH CORONARY ANGIOGRAM;  Surgeon:  Leonie Man, MD;  Location: [facility] CATH LAB;  Service: Cardiovascular;  Laterality: N/A;  . PERCUTANEOUS CORONARY STENT INTERVENTION (PCI-S)  04/22/2012   Procedure: PERCUTANEOUS CORONARY STENT INTERVENTION (PCI-S);  Surgeon: Leonie Man, MD;  Location: [facility] CATH LAB;  Service: Cardiovascular;;  . VIDEO BRONCHOSCOPY WITH ENDOBRONCHIAL ULTRASOUND N/A 09/24/2016   Procedure: VIDEO BRONCHOSCOPY WITH ENDOBRONCHIAL ULTRASOUND;  Surgeon: Marshell Garfinkel, MD;  Location: [place];  Service: Pulmonary;  Laterality: N/A;   Social History   Social History  . Marital status: Married    Spouse name: N/A  . Number of children: N/A  . Years of education: N/A   Occupational History  . RETIRED     Social History Main Topics  . Smoking status: Former Smoker    Packs/day: 0.75    Years: 40.00    Types: Cigarettes    Start date: 12/20/1983    Quit date: 09/15/2012  . Smokeless tobacco: Current User    Types: Snuff  . Alcohol use No     Comment: 08/24/2012 "used to drink; not anymore; stopped > 88yr ago; never had any problems w/it"  . Drug use: No     Comment: quit 2012  . Sexual activity: Yes   Other Topics Concern  . 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    Tobacco Use - 1PPD x 40+ years,  has quit x 3 days   ETOH- rare   Drugs- occasional marijuana    1 of 17 kids   [place]     Allergies  Allergen Reactions  . Lisinopril Anaphylaxis and Swelling    angioedema  . Naproxen Hives  . Prednisone Nausea And Vomiting and Other (See Comments)    "I felt like my stomach was in knots, very painful"    Last set of Vital Signs (not current) Vitals:   09/23/2016 2205 10/08/2016 2312  BP: 107/61 92/63  Pulse: 88   Resp:    Temp:        Physical Exam  Gen: unresponsive Cardiovascular: pulseless  Resp: apneic. Breath sounds equal bilaterally with bagging  Abd: nondistended  Neuro: GCS 3, unresponsive to pain  HEENT: No blood in posterior pharynx, gag reflex absent  Neck: No crepitus  Musculoskeletal: No deformity  Skin: warm  Procedures  INTUBATION Performed by: REzequiel EssexRequired items: required blood products, implants, devices, and special equipment available Patient identity confirmed: provided demographic data and hospital-assigned identification number Time out: Immediately prior to procedure a "time out" was called to verify the correct patient, procedure, equipment, support staff and site/side marked as required. Indications: respiratory  failure Intubation method:video and DL Preoxygenation: BVM Sedatives: none Paralytic: none Tube Size: 7.5 cuffed Post-procedure assessment: chest rise and ETCO2 monitor Breath sounds: equal and absent over the epigastrium Tube secured by Respiratory Therapy Patient tolerated the procedure well with no immediate complications.  CRITICAL CARE Performed by: REzequiel EssexTotal critical care time: 60 Critical care time was exclusive of separately billable procedures and treating other patients. Critical care was necessary to treat or prevent imminent or life-threatening deterioration. Critical care was time spent personally by me on the following activities: development of treatment plan with patient and/or surrogate as well as nursing, discussions with consultants, evaluation of patient's  response to treatment, examination of patient, obtaining history from patient or surrogate, ordering and performing treatments and interventions, ordering and review of laboratory studies, ordering and review of radiographic studies, pulse oximetry and re-evaluation of patient's condition.  Cardiopulmonary Resuscitation (CPR) Procedure Note Directed/Performed by: Ezequiel Essex I personally directed ancillary staff and/or performed CPR in an effort to regain return of spontaneous circulation and to maintain cardiac, neuro and systemic perfusion.   Patient with PEA arrest on arrival. Chest compressions continued. Patient given IV epinephrine. Patient intubated as above. CPR continued for approximately 4 rounds. Patient received 4 rounds of epinephrine, calcium, bicarbonate. He did received one shock for ventricular tachycardia and one shock for ventricular fibrillation. Blood sugar was found to be undetectable he was given multiple doses of D50  Patient did have return of spontaneous circulation after 4 rounds of epinephrine and above medications. Blood sugar still remains low and additional D50 given. EKG  shows no STEMI.  Care transferred to Dr. Shanon Brow who will transfer patient to ICU. Family updated.    Ezequiel Essex, MD 10/13/16 [phone]

## 2016-10-13 NOTE — Progress Notes (Signed)
PROGRESS NOTE    Adam House  WGY:[id number] DOB: [birth year] DOA: 10/11/2016 PCP: Glenda Chroman, MD     Brief Narrative:  Patient is a 66 year old man admitted from home on 1/28 with complaints of diarrhea and generalized weakness following chemotherapy treatment for his non-small cell lung cancer. Overnight events noted. He had a PEA arrest twice , had to be shocked twice once for V. fib and once for ventricular tachycardia, had return of spontaneous circulation .is now on 3 pressors, broad-spectrum antibiotics, steroids and intubated.    Assessment & Plan:   Active Problems:   Dyslipidemia   Essential hypertension, benign   Implantable cardioverter-defibrillator (ICD) in situ   Chronic kidney disease (CKD), stage II (mild)   Chronic systolic CHF (congestive heart failure) (HCC)   Diarrhea   SIRS (systemic inflammatory response syndrome) (HCC)   Leukopenia   AKI (acute kidney injury) ([place])   Cardiac arrest (HCC)   Chemotherapy-induced neutropenia (HCC)   S/p cardiac arrest/Mechanical Ventilation/multisystem organ failure -Patient is currently very critically ill, requiring 3 pressors (dopamine, levothyroid, norepinephrine) to maintain a systolic blood pressure of around 90 to low 100. Has unequal, dilated pupils with upper extremity posturing that likely indicates an anoxic brain injury. -Has been hypothermic, requiring a bear hugger, also has been repeatedly hypoglycemic. -Following code labs showed transaminases in the thousand to 2000 range, hemoglobin of 6.1 that had dropped from 12, platelets of 18, CBG that was undetectable, later 10 on blood draw. -Has been started on broad-spectrum antibiotics, although not clear that he truly has a source of infection. -Noticed to have minor rectal bleeding and bleeding around ET tube likely related to thrombocytopenia. -Has received 2 units of PRBCs, 1 unit of FFP and 1 unit of platelets. -Have discussed care with family members  multiple times today, they understand the gravity of the situation and are leaning towards withdrawal of care. They request that we not do this until other family members have had the time to come today and see patient. They would not like repeat chest compressions. DO NOT RESUSCITATE order placed.    DVT prophylaxis: Warfarin (on hold) Code Status: DNR Family Communication: multiple family members Disposition Plan: likely withdrawal of care  Consultants:   Pulmonology  oncology  Procedures:   None  Antimicrobials:  Anti-infectives    Start     Dose/Rate Route Frequency Ordered Stop   10/13/16 2200  vancomycin (VANCOCIN) IVPB 1000 mg/200 mL premix     1,000 mg 200 mL/hr over 60 Minutes Intravenous Every 24 hours 10/13/16 0814     10/13/16 1400  piperacillin-tazobactam (ZOSYN) IVPB 3.375 g     3.375 g 12.5 mL/hr over 240 Minutes Intravenous Every 8 hours 10/13/16 0814     10/13/16 0245  vancomycin (VANCOCIN) IVPB 1000 mg/200 mL premix     1,000 mg 200 mL/hr over 60 Minutes Intravenous  Once 10/13/16 0232 10/13/16 0631   10/13/16 0215  piperacillin-tazobactam (ZOSYN) IVPB 3.375 g     3.375 g 12.5 mL/hr over 240 Minutes Intravenous  Once 10/13/16 0212 10/13/16 0931   10/13/2016 1330  piperacillin-tazobactam (ZOSYN) IVPB 3.375 g     3.375 g 100 mL/hr over 30 Minutes Intravenous  Once 09/26/2016 1325 09/18/2016 1424       Subjective: Intubated, sedated  Objective: Vitals:   10/13/16 1500 10/13/16 1530 10/13/16 1600 10/13/16 1714  BP: 104/78 112/83 112/83   Pulse:    100  Resp: (!) 30 (!) 32 (!) 26 (!) 31  Temp: 98.1 F (36.7 C)   (!) 96 F (35.6 C)  TempSrc: Rectal   Rectal  SpO2:    97%  Weight:      Height:        Intake/Output Summary (Last 24 hours) at 10/13/16 1725 Last data filed at 10/13/16 1500  Gross per 24 hour  Intake          3117.47 ml  Output              700 ml  Net          2417.47 ml   Filed Weights   10/02/2016 1215 10/06/2016 1716  Weight: 77.1  kg (170 lb) 80.7 kg (177 lb 14.6 oz)    Examination:  General exam: intubated, sedated Respiratory system: coarse bilateral breath sounds Cardiovascular system:RRR. No murmurs, rubs, gallops. Gastrointestinal system: Abdomen is nondistended, soft and nontender. No organomegaly or masses felt. Normal bowel sounds heard. Central nervous system: unequal, dilated pupils, right upper extremity posturing Extremities: No C/C/E, +pedal pulses Skin: LE mottling     Data Reviewed: I have personally reviewed following labs and imaging studies  CBC:  Recent Labs Lab 10/13/16 0116 10/13/16 0341 10/13/16 1412  WBC 0.8* 0.5* 0.4*  NEUTROABS 0.0*  --   --   HGB 5.8* 6.1* 12.2*  HCT 16.9* 17.8* 33.9*  MCV 81.3 80.5 79.8  PLT 14* 18* 43*   Basic Metabolic Panel:  Recent Labs Lab 10/13/16 0116 10/13/16 0341 10/13/16 1412 10/13/16 1425  NA 122* 133*  --  138  K 6.4* 5.5*  --  3.8  CL 95* 101  --  101  CO2 11* 14*  --  17*  GLUCOSE 1,168* 558*  --  114*  BUN 46* 46*  --  47*  CREATININE 3.07* 3.16*  --  3.80*  CALCIUM 7.5* 6.5*  --  6.7*  MG 1.8  --   --   --   PHOS 11.2*  --  6.2*  --    GFR: Estimated Creatinine Clearance: 19.4 mL/min (by C-G formula based on SCr of 3.8 mg/dL (H)). Liver Function Tests:  Recent Labs Lab 10/13/16 0116 10/13/16 0341  AST 915* 2,200*  ALT 687* 1,029*  ALKPHOS 26* 25*  BILITOT 0.6 1.0  PROT <3.0* <3.0*  ALBUMIN 1.2* 1.2*   No results for input(s): LIPASE, AMYLASE in the last 168 hours. No results for input(s): AMMONIA in the last 168 hours. Coagulation Profile:  Recent Labs Lab 09/28/2016 1859 10/13/16 0116  INR 1.61 4.09*   Cardiac Enzymes: No results for input(s): CKTOTAL, CKMB, CKMBINDEX, TROPONINI in the last 168 hours. BNP (last 3 results) No results for input(s): PROBNP in the last 8760 hours. HbA1C: No results for input(s): HGBA1C in the last 72 hours. CBG:  Recent Labs Lab 10/13/16 0510 10/13/16 0948  10/13/16 1115 10/13/16 1307 10/13/16 1648  GLUCAP 350* 134* 119* 70 <10*   Lipid Profile: No results for input(s): CHOL, HDL, LDLCALC, TRIG, CHOLHDL, LDLDIRECT in the last 72 hours. Thyroid Function Tests: No results for input(s): TSH, T4TOTAL, FREET4, T3FREE, THYROIDAB in the last 72 hours. Anemia Panel: No results for input(s): VITAMINB12, FOLATE, FERRITIN, TIBC, IRON, RETICCTPCT in the last 72 hours. Urine analysis:    Component Value Date/Time   COLORURINE YELLOW 10/13/2016 0330   APPEARANCEUR CLEAR 10/13/2016 0330   LABSPEC 1.015 10/13/2016 0330   PHURINE 5.5 10/13/2016 0330   GLUCOSEU NEGATIVE 10/13/2016 0330   HGBUR LARGE (A) 10/13/2016 0330   BILIRUBINUR  NEGATIVE 10/13/2016 0330   KETONESUR NEGATIVE 10/13/2016 0330   PROTEINUR TRACE (A) 10/13/2016 0330   NITRITE NEGATIVE 10/13/2016 0330   LEUKOCYTESUR NEGATIVE 10/13/2016 0330   Sepsis Labs: '@LABRCNTIP'$ (procalcitonin:4,lacticidven:4)  ) Recent Results (from the past 240 hour(s))  MRSA PCR Screening     Status: None   Collection Time: 10/13/16  3:31 AM  Result Value Ref Range Status   MRSA by PCR NEGATIVE NEGATIVE Final    Comment:        The GeneXpert MRSA Assay (FDA approved for NASAL specimens only), is one component of a comprehensive MRSA colonization surveillance program. It is not intended to diagnose MRSA infection nor to guide or monitor treatment for MRSA infections.          Radiology Studies: Dg Chest Port 1 View  Result Date: 10/13/2016 CLINICAL DATA:  66 year old male status post CPR. History of sepsis. EXAM: PORTABLE CHEST 1 VIEW COMPARISON:  Chest x-ray 10/13/2016. FINDINGS: An endotracheal tube is in place with tip 3.8 cm above the carina. There is a right upper extremity PICC with tip terminating in the mid superior vena cava. Left-sided pacemaker/AICD with lead tips projecting over the expected location of the right atrium and right ventricular apex. There is cephalization of the  pulmonary vasculature, indistinctness of the interstitial markings, and patchy airspace disease throughout the lungs bilaterally suggestive of moderate pulmonary edema. Low lung volumes. Mild cardiomegaly. Upper mediastinal contours are within normal limits. No definite pneumothorax. Orthopedic fixation hardware in the lower cervical spine. Electronic device projecting over the lower right hemithorax. IMPRESSION: 1. Support apparatus, as above. 2. The appearance the chest suggests mild to moderate congestive heart failure. Electronically Signed   By: Vinnie Langton M.D.   On: 10/13/2016 10:19   Dg Chest Port 1 View  Result Date: 10/13/2016 CLINICAL DATA:  Endotracheal tube placement. EXAM: PORTABLE CHEST 1 VIEW COMPARISON:  Chest from acute abdomen series yesterday. FINDINGS: Endotracheal tube 3.9 cm from the carina. Tip of the right central line in mid SVC. 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 The heart is enlarged. Bilateral lung base interstitial opacities again seen. Increasing opacities throughout the right lung and left infrahilar region. Left lower lobe pulmonary nodule on prior PET-CT not visualized radiographically. No definite pleural fluid. No pneumothorax. IMPRESSION: 1. Endotracheal tube 3.9 cm from the carina. 2. Stable cardiomegaly. Increasing opacities throughout the right lung and left infrahilar region, favor vascular congestion over aspiration or pneumonia. Electronically Signed   By: Jeb Levering M.D.   On: 10/13/2016 02:32        Scheduled Meds: . allopurinol  300 mg Oral Daily  . calcium chloride  IV  1 g Intravenous Once  . febuxostat  40 mg Oral Daily  . feeding supplement  1 Container Oral TID BM  . feeding supplement (ENSURE ENLIVE)  237 mL Oral BID BM  . hydrocortisone sod succinate (SOLU-CORTEF) inj  50 mg Intravenous Q8H  . pantoprazole (PROTONIX) IV  40 mg Intravenous Q24H  . piperacillin-tazobactam (ZOSYN)  IV  3.375 g Intravenous Q8H  . [START ON  10/28/16] simvastatin  10 mg Oral QHS  . vancomycin  1,000 mg Intravenous Q24H  . Warfarin - Pharmacist Dosing Inpatient   Does not apply Q24H   Continuous Infusions: . sodium chloride    . dextrose 10 % 1,000 mL with sodium bicarbonate 150 mEq infusion 75 mL/hr at 10/13/16 1451  . DOPamine 15 mcg/kg/min (10/13/16 1645)  . norepinephrine (LEVOPHED) Adult infusion 50 mcg/min (  10/13/16 1311)  . phenylephrine (NEO-SYNEPHRINE) Adult infusion 220 mcg/min (10/13/16 1523)     LOS: 1 day    Critical care time spent: 75 minutes. Greater than 50% of this time was spent in direct contact with the patient coordinating care.     Lelon Frohlich, MD [facility] Pager [phone]  If 7PM-7AM, please contact night-coverage [email or web address] Password [email or web address] 10/13/2016, 5:25 PM

## 2016-10-13 NOTE — Progress Notes (Signed)
Initial Nutrition Assessment   INTERVENTION:  If pt expected to remain intubated >24 hr and is hemodynamically stable: Initiate Vital 1.2 @ 70  Ml/hr (1680 ml every 24 hr) via OGT. Tube feeding regimen provides 2016 kcal, 126 grams of protein, and 1362 ml of H2O.     NUTRITION DIAGNOSIS:   Inadequate oral intake related to inability to eat as evidenced by NPO status.   GOAL:   Provide needs based on ASPEN/SCCM guidelines   MONITOR:   Vent status, Labs, I & O's  REASON FOR ASSESSMENT:   Ventilator    ASSESSMENT: Patient is being treated with chemotherapy ( 3 treatments) due to non-small cell lung cancer. His hx includes CKD,  ischemic cardiomyopathy (EF 25-30%), CHF, HTN, Stroke, MI and GOUT. He presents with  weakness and diarrhea. His wife is here with him. Weight hx shows acute decrease 2% in 1 week which is significant. Likely related to his recent decrease in oral intake following his last treatment.  His usual weight range 82-86 kg.   Patient is currently intubated on ventilator support and on pressors. MV: 19.8 L/min Temp (24hrs), Avg:96.5 F (35.8 C), Min:92 F (33.3 C), Max:99.5 F (37.5 C)     Unable to complete Nutrition-Focused physical exam at this time.   Recent Labs Lab 10/13/16 0116 10/13/16 0341 10/13/16 1412 10/13/16 1425  NA 122* 133*  --  138  K 6.4* 5.5*  --  3.8  CL 95* 101  --  101  CO2 11* 14*  --  17*  BUN 46* 46*  --  47*  CREATININE 3.07* 3.16*  --  3.80*  CALCIUM 7.5* 6.5*  --  6.7*  MG 1.8  --   --   --   PHOS 11.2*  --  6.2*  --   GLUCOSE 1,168* 558*  --  114*    Abnormal labs: BUN-47, Cr 3.8 (baseline 1.3), Phos 6.2 (improved from 11.2)  Diet Order:  Diet Heart Room service appropriate? Yes; Fluid consistency: Thin Diet clear liquid Room service appropriate? Yes; Fluid consistency: Thin  Skin:  Reviewed, no issues  Last BM:  1/28 abdomen distended today  Height:   Ht Readings from Last 1 Encounters:  09/22/2016 '5\' 9"'$   (1.753 m)    Weight:   Wt Readings from Last 1 Encounters:  09/15/2016 177 lb 14.6 oz (80.7 kg)    Ideal Body Weight:  73 kg  BMI:  Body mass index is 26.27 kg/m.  Estimated Nutritional Needs:   Kcal:  2052  Protein:  97-121 gr  Fluid:  >2.0 liters daily  EDUCATION NEEDS:   No education needs identified at this time  Colman Cater MS,RD,CSG,LDN Office: #[phone] Pager: [phone]

## 2016-10-13 NOTE — Progress Notes (Signed)
Pharmacy Antibiotic Note  Adam House is a 66 y.o. male admitted on 09/25/2016 with sepsis.  Pharmacy has been consulted for [place] dosing.  Plan:  Vancomycin '1000mg'$  IV q24h, will start 2nd dose early as pt not fully loaded Check trough at steady state Zosyn 3.375gm IV q8h, EID Monitor labs, renal fxn, progress and c/s Deescalate ABX when improved / appropriate.    Height: '5\' 9"'$  (175.3 cm) Weight: 177 lb 14.6 oz (80.7 kg) IBW/kg (Calculated) : 70.7  Temp (24hrs), Avg:97.1 F (36.2 C), Min:92 F (33.3 C), Max:98.6 F (37 C)   Recent Labs Lab 10/11/2016 1359 10/13/16 0116 10/13/16 0341  WBC  --  0.8* 0.5*  CREATININE  --  3.07* 3.16*  LATICACIDVEN 2.4* 11.6* 12.6*    Estimated Creatinine Clearance: 23.3 mL/min (by C-G formula based on SCr of 3.16 mg/dL (H)).    Allergies  Allergen Reactions  . Lisinopril Anaphylaxis and Swelling    angioedema  . Naproxen Hives  . Prednisone Nausea And Vomiting and Other (See Comments)    "I felt like my stomach was in knots, very painful"   Antimicrobials this admission: Vanc 1/29 >>  Zosyn 1/28 >>   Dose adjustments this admission:  Microbiology results:  BCx: pending  UCx: pending   Sputum:    MRSA PCR:   Thank you for allowing pharmacy to be a part of this patient's care.  Hart Robinsons A 10/13/2016 8:16 AM

## 2016-10-13 NOTE — Progress Notes (Signed)
MD Jerilee Hoh in to assess patient and speak with family, aware of patient's recent lab results

## 2016-10-13 NOTE — Progress Notes (Signed)
ANTICOAGULATION CONSULT NOTE - follow up  Pharmacy Consult for COUMADIN (home med) Indication: atrial fibrillation  Allergies  Allergen Reactions  . Lisinopril Anaphylaxis and Swelling    angioedema  . Naproxen Hives  . Prednisone Nausea And Vomiting and Other (See Comments)    "I felt like my stomach was in knots, very painful"   Patient Measurements: Height: '5\' 9"'$  (175.3 cm) Weight: 177 lb 14.6 oz (80.7 kg) IBW/kg (Calculated) : 70.7  Vital Signs: Temp: 92 F (33.3 C) (01/29 0545) Temp Source: Rectal (01/29 0545) BP: 117/80 (01/29 0600) Pulse Rate: 87 (01/29 0545)  Labs:  Recent Labs  10/02/2016 1859 10/13/16 0116 10/13/16 0341  HGB  --  5.8* 6.1*  HCT  --  16.9* 17.8*  PLT  --  14* 18*  LABPROT 19.3* 40.9*  --   INR 1.61 4.09*  --   CREATININE  --  3.07* 3.16*   Estimated Creatinine Clearance: 23.3 mL/min (by C-G formula based on SCr of 3.16 mg/dL (H)).  Medical History: Past Medical History:  Diagnosis Date  . Arthritis    gout  . Asthma   . Atrial fibrillation ([place])   . CAD (coronary artery disease)    a. ant MI 1998 with PCI to LAD. b. Ant-lat MI 04/2012 tx with overlapping DES to LAD.  [name] CHF (congestive heart failure) ([place])   . CRI (chronic renal insufficiency)   . CVA (cerebral infarction) 11/2010  . DVT (deep venous thrombosis) ([place])   . Esophageal reflux   . Gout   . Heart murmur   . Hypercholesteremia   . ICD (implantable cardiac defibrillator) in place   . Impotence of organic origin   . Ischemic cardiomyopathy    EF 25-30% by echo 04/2012; [place]  . Lung disease, interstitial ([place])   . Myocardial infarction 1998 and 2005  . Peripheral arterial disease ([place])   . Pneumonia    hx  . 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 Right internal carotid occlusion   . Stroke [facility]) ? 2006  . Systolic CHF (H[facility])    NYHA Class II/III  . Tobacco use disorder   . Unspecified essential hypertension    Medications:  Prescriptions Prior to  Admission  Medication Sig Dispense Refill Last Dose  . albuterol (PROVENTIL HFA;VENTOLIN HFA) 108 (90 Base) MCG/ACT inhaler Inhale 2 puffs into the lungs every 6 (six) hours as needed for wheezing or shortness of breath. 1 Inhaler 1 10/11/2016 at Unknown time  . allopurinol (ZYLOPRIM) 300 MG tablet Take 300 mg by mouth daily.  4 Past Week at Unknown time  . ALPRAZolam (XANAX) 0.5 MG tablet Take 0.5 mg by mouth 2 (two) times daily as needed for anxiety.   09/19/2016 at Unknown time  . colchicine 0.6 MG tablet Take 0.6 mg by mouth daily.   10/11/2016 at Unknown time  . ETOPOSIDE IV Inject into the vein. Days 1-3 every 21 days   10/08/2016 at Unknown time  . furosemide (LASIX) 20 MG tablet Take 1 tablet (20 mg total) by mouth daily. 90 tablet 3 10/11/2016 at Unknown time  . ipratropium (ATROVENT) 0.02 % nebulizer solution Take 0.5 mg by nebulization every 6 (six) hours as needed for wheezing or shortness of breath.   Past Month at Unknown time  . isosorbide mononitrate (IMDUR) 30 MG 24 hr tablet Take 1 tablet (30 mg total) by mouth daily. 90 tablet 3 10/11/2016 at Unknown time  . omeprazole (PRILOSEC) 20 MG capsule  Take 20 mg by mouth daily.  2 10/11/2016 at Unknown time  . simvastatin (ZOCOR) 10 MG tablet Take 1 tablet (10 mg total) by mouth at bedtime. 90 tablet 3 10/11/2016 at Unknown time  . sotalol (BETAPACE) 120 MG tablet Take 120 mg by mouth 2 (two) times daily.  4 10/11/2016 at Unknown time  . ULORIC 40 MG tablet Take 1 tablet by mouth daily.  3 10/11/2016 at Unknown time  . NITROSTAT 0.4 MG SL tablet PLACE 1 TABLET UNDER TONGUE EVERY 5 MINUTES AS NEEDED FOR CHEST PAIN (UP TO 3 DOSES) 25 tablet 3 unknown  . ondansetron (ZOFRAN) 8 MG tablet Take 1 tablet (8 mg total) by mouth every 8 (eight) hours as needed for nausea or vomiting. 30 tablet 2 10/01/2016  . Pegfilgrastim (NEULASTA ONPRO [place]) Inject into the skin. Every 21 days - to be administered 27 hours post chemo completion   10/08/2016  . potassium  chloride (K-DUR) 10 MEQ tablet Take 10 mEq by mouth 2 (two) times daily.   unknown  . prochlorperazine (COMPAZINE) 10 MG tablet Take 1 tablet (10 mg total) by mouth every 6 (six) hours as needed for nausea or vomiting. 30 tablet 2 10/08/2016  . warfarin (COUMADIN) 5 MG tablet Take 1 tablet (5 mg total) by mouth daily. 30 tablet 6 10/10/2016 at 0800   Assessment: 66yo male on chronic Coumadin for h/o afib.  INR SUBtherapeutic on admission but has now trended up to > 4.  Coumadin dose was not given yesterday.  Home dose listed above, last dose taken reportedly 1/26.  H/H, PLTs low.  Goal of Therapy:  INR 2-3 Monitor platelets by anticoagulation protocol: Yes   Plan:  HOLD coumadin, allow INR to trend back down INR daily Monitor CBC, s/sx bleeding complications  Hart Robinsons A 10/13/2016,8:09 AM

## 2016-10-13 NOTE — Progress Notes (Signed)
Pt's family does not wish to have anymore blood draws or blood glucose sticks, MD Jerilee Hoh aware. Family is okay for patient to have blood glucose check q4hr.

## 2016-10-13 NOTE — Progress Notes (Signed)
Patient capillary BGL low reading on machine, stat lab draw and pt given 1amp D50, MD Jerilee Hoh aware.   1820: Notified MD Jerilee Hoh of pt's BGL 12, stat lab draw ordered, pt given 1 amp D50.

## 2016-10-13 NOTE — Consult Note (Signed)
Consult requested by: Dr. Jerilee Hoh Consult requested for respiratory failure:  HPI: This is a 66 year old who had presented to the emergency room at [facility] in evening with nausea vomiting and diarrhea. He has non-small cell lung cancer and has had chemotherapy. He was transferred here because of dehydration diarrhea and because his oncologist is here. He had cyanotic fingers oxygen saturation was not able to be measured well. He was given IV fluids. He came to the emergency department was noted to have a lactate level of 2.4 he was started on Zosyn intravenously and apparently was doing well to start. But then had CODE BLUE last night. He became unresponsive pulseless and he was found to BNP EEA arrest intubated had CPR eventually responded to treatment. He has required dopamine levo fed bicarbonate Neo-Synephrine. His hemoglobin level dropped. Platelets dropped initial pH was 6.9. Lactate 12.4. He apparently had some bright red blood per rectum he was hypothermic and has been on a warming blanket. He has some blood in his endotracheal tube. Most of the history is obtained from the medical record and I discussed his situation with his wife at bedside  Past Medical History:  Diagnosis Date  . Arthritis    gout  . Asthma   . Atrial fibrillation ([place])   . CAD (coronary artery disease)    a. ant MI 1998 with PCI to LAD. b. Ant-lat MI 04/2012 tx with overlapping DES to LAD.  [name] CHF (congestive heart failure) ([place])   . CRI (chronic renal insufficiency)   . CVA (cerebral infarction) 11/2010  . DVT (deep venous thrombosis) ([place])   . Esophageal reflux   . Gout   . Heart murmur   . Hypercholesteremia   . ICD (implantable cardiac defibrillator) in place   . Impotence of organic origin   . Ischemic cardiomyopathy    EF 25-30% by echo 04/2012; [place]  . Lung disease, interstitial ([place])   . Myocardial infarction 1998 and 2005  . Peripheral arterial disease ([place])   . Pneumonia     hx  . 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 Right internal carotid occlusion   . Stroke [facility]) ? 2006  . Systolic CHF (HCC)    NYHA Class II/III  . Tobacco use disorder   . Unspecified essential hypertension      Family History  Problem Relation Age of Onset  . Diabetes Mother   . Cancer Mother     Ovarian  . Heart disease Mother   . Hyperlipidemia Mother   . Hypertension Mother   . Heart attack Mother   . Heart disease Father   . Hyperlipidemia Father   . Hypertension Father   . Heart attack Father   . Heart disease Brother   . Heart attack Brother   . Cancer Sister     Ovarian-Liver-Lung-Brain  . Heart disease Sister   . Heart attack Daughter   . Heart disease Daughter     Before age 75  . Allergies Daughter   . Cancer Sister     Lung  . Heart disease Sister   . Emphysema Paternal Grandfather      Social History   Social History  . Marital status: Married    Spouse name: N/A  . Number of children: N/A  . Years of education: N/A   Occupational History  . RETIRED    Social History Main Topics  . Smoking status: Former Smoker    Packs/day: 0.75  Years: 40.00    Types: Cigarettes    Start date: 12/20/1983    Quit date: 09/15/2012  . Smokeless tobacco: Current User    Types: Snuff  . Alcohol use No     Comment: 08/24/2012 "used to drink; not anymore; stopped > 94yr ago; never had any problems w/it"  . Drug use: No     Comment: quit 2012  . Sexual activity: Yes   Other Topics Concern  . 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    Tobacco Use - 1PPD x 40+ years,  has quit x 3 days   ETOH- rare   Drugs- occasional marijuana    1 of 17 kids   [place]       ROS: He had complained of being sore all over. Prior to all of this he did not have any chest pain  he wasn't short of breath but he was having nausea vomiting and diarrhea. Otherwise except as mentioned 10 point review of systems negative but this is provided by his wife not by the patient    Objective: Vital signs in last 24 hours: Temp:  [92 F (33.3 C)-98.6 F (37 C)] 92 F (33.3 C) (01/29 0545) Pulse Rate:  [30-102] 87 (01/29 0545) Resp:  [18-38] 37 (01/29 0600) BP: (57-124)/(31-92) 117/80 (01/29 0600) SpO2:  [15 %-100 %] 100 % (01/29 0500) FiO2 (%):  [60 %-100 %] 60 % (01/29 0543) Weight:  [77.1 kg (170 lb)-80.7 kg (177 lb 14.6 oz)] 80.7 kg (177 lb 14.6 oz) (01/28 1716) Weight change:  Last BM Date: 09/15/2016  Intake/Output from previous day: 01/28 0701 - 01/29 0700 In: 1142.1 [I.V.:418.1; Blood:474; IV Piggyback:250] Out: -   PHYSICAL EXAM Constitutional: He is unresponsive. He is intubated and on the ventilator. Eyes: There may be faint pupillary response. Ears nose mouth and throat: Mucous membranes are dry. Cardiovascular: His heart is mildly irregular. I don't hear a gallop. Respiratory: He has bilateral rales. He has blood in the endotracheal tube. Gastrointestinal his abdomen is distended but soft. Bowel sounds hypoactive. Neurological unable to assess. Psychiatric: Unable to assess. Musculoskeletal: Unable to assess  Lab Results: Basic Metabolic Panel:  Recent Labs  10/13/16 0116 10/13/16 0341  NA 122* 133*  K 6.4* 5.5*  CL 95* 101  CO2 11* 14*  GLUCOSE 1,168* 558*  BUN 46* 46*  CREATININE 3.07* 3.16*  CALCIUM 7.5* 6.5*  MG 1.8  --   PHOS 11.2*  --    Liver Function Tests:  Recent Labs  10/13/16 0116 10/13/16 0341  AST 915* 2,200*  ALT 687* 1,029*  ALKPHOS 26* 25*  BILITOT 0.6 1.0  PROT <3.0* <3.0*  ALBUMIN 1.2* 1.2*   No results for input(s): LIPASE, AMYLASE in the last 72 hours. No results for input(s): AMMONIA in the last 72 hours. CBC:  Recent Labs  10/13/16 0116 10/13/16 0341  WBC 0.8* 0.5*  NEUTROABS 0.0*  --   HGB 5.8* 6.1*   HCT 16.9* 17.8*  MCV 81.3 80.5  PLT 14* 18*   Cardiac Enzymes: No results for input(s): CKTOTAL, CKMB, CKMBINDEX, TROPONINI in the last 72 hours. BNP: No results for input(s): PROBNP in the last 72 hours. D-Dimer: No results for input(s): DDIMER in the last 72 hours. CBG:  Recent Labs  10/13/16 0041 10/13/16 0103 10/13/16 0116 10/13/16 0218 10/13/16 0242 10/13/16 0317  GLUCAP 18* 31* 185* 452* 364* 347*   Hemoglobin A1C: No results for input(s): HGBA1C in the last 72 hours. Fasting Lipid Panel: No results for input(s): CHOL, HDL, LDLCALC, TRIG, CHOLHDL, LDLDIRECT in the last 72 hours. Thyroid Function Tests: No results for input(s): TSH, T4TOTAL, FREET4, T3FREE, THYROIDAB in the last 72 hours. Anemia Panel: No results for input(s): VITAMINB12, FOLATE, FERRITIN, TIBC, IRON, RETICCTPCT in the last 72 hours. Coagulation:  Recent Labs  09/16/2016 1859 10/13/16 0116  LABPROT 19.3* 40.9*  INR 1.61 4.09*   Urine Drug Screen: Drugs of Abuse  No results found for: LABOPIA, COCAINSCRNUR, LABBENZ, AMPHETMU, THCU, LABBARB  Alcohol Level: No results for input(s): ETH in the last 72 hours. Urinalysis:  Recent Labs  10/13/16 0330  COLORURINE YELLOW  LABSPEC 1.015  PHURINE 5.5  GLUCOSEU NEGATIVE  HGBUR LARGE*  BILIRUBINUR NEGATIVE  KETONESUR NEGATIVE  PROTEINUR TRACE*  NITRITE NEGATIVE  LEUKOCYTESUR NEGATIVE   Misc. Labs:   ABGS:  Recent Labs  10/13/16 0345  PHART 7.204*  PO2ART 164.0*  HCO3 12.9*     MICROBIOLOGY: Recent Results (from the past 240 hour(s))  MRSA PCR Screening     Status: None   Collection Time: 10/13/16  3:31 AM  Result Value Ref Range Status   MRSA by PCR NEGATIVE NEGATIVE Final    Comment:        The GeneXpert MRSA Assay (FDA approved for NASAL specimens only), is one component of a comprehensive MRSA colonization surveillance program. It is not intended to diagnose MRSA infection nor to guide or monitor treatment for MRSA  infections.     Studies/Results: Dg Chest Port 1 View  Result Date: 10/13/2016 CLINICAL DATA:  Endotracheal tube placement. EXAM: PORTABLE CHEST 1 VIEW COMPARISON:  Chest from acute abdomen series yesterday. FINDINGS: Endotracheal tube 3.9 cm from the carina. Tip of the right central line in mid SVC. 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 The heart is enlarged. Bilateral lung base interstitial opacities again seen. Increasing opacities throughout the right lung and left infrahilar region. Left lower lobe pulmonary nodule on prior PET-CT not visualized radiographically. No definite pleural fluid. No pneumothorax. IMPRESSION: 1. Endotracheal tube 3.9 cm from the carina. 2. Stable cardiomegaly. Increasing opacities throughout the right lung and left infrahilar region, favor vascular congestion over aspiration or pneumonia. Electronically Signed   By: Jeb Levering M.D.   On: 10/13/2016 02:32    Medications:  Prior to Admission:  Prescriptions Prior to Admission  Medication Sig Dispense Refill Last Dose  . albuterol (PROVENTIL HFA;VENTOLIN HFA) 108 (90 Base) MCG/ACT inhaler Inhale 2 puffs into the lungs every 6 (six) hours as needed for wheezing or shortness of breath. 1 Inhaler 1 10/11/2016 at Unknown time  . allopurinol (ZYLOPRIM) 300 MG tablet Take 300 mg by mouth daily.  4 Past Week at Unknown time  . ALPRAZolam (XANAX) 0.5 MG tablet Take 0.5 mg by mouth 2 (two) times daily as needed for anxiety.   09/27/2016 at Unknown time  . colchicine 0.6 MG tablet Take 0.6 mg by mouth daily.   10/11/2016 at Unknown time  . ETOPOSIDE IV Inject into the vein. Days 1-3 every 21 days   10/08/2016 at Unknown time  . furosemide (LASIX) 20 MG tablet Take 1 tablet (20 mg total) by mouth daily. 90 tablet 3 10/11/2016 at Unknown time  . ipratropium (ATROVENT) 0.02 % nebulizer solution Take 0.5  mg by nebulization every 6 (six) hours as needed for wheezing or shortness of breath.   Past Month at Unknown time  .  isosorbide mononitrate (IMDUR) 30 MG 24 hr tablet Take 1 tablet (30 mg total) by mouth daily. 90 tablet 3 10/11/2016 at Unknown time  . omeprazole (PRILOSEC) 20 MG capsule Take 20 mg by mouth daily.  2 10/11/2016 at Unknown time  . simvastatin (ZOCOR) 10 MG tablet Take 1 tablet (10 mg total) by mouth at bedtime. 90 tablet 3 10/11/2016 at Unknown time  . sotalol (BETAPACE) 120 MG tablet Take 120 mg by mouth 2 (two) times daily.  4 10/11/2016 at Unknown time  . ULORIC 40 MG tablet Take 1 tablet by mouth daily.  3 10/11/2016 at Unknown time  . NITROSTAT 0.4 MG SL tablet PLACE 1 TABLET UNDER TONGUE EVERY 5 MINUTES AS NEEDED FOR CHEST PAIN (UP TO 3 DOSES) 25 tablet 3 unknown  . ondansetron (ZOFRAN) 8 MG tablet Take 1 tablet (8 mg total) by mouth every 8 (eight) hours as needed for nausea or vomiting. 30 tablet 2 10/01/2016  . Pegfilgrastim (NEULASTA ONPRO [place]) Inject into the skin. Every 21 days - to be administered 27 hours post chemo completion   10/08/2016  . potassium chloride (K-DUR) 10 MEQ tablet Take 10 mEq by mouth 2 (two) times daily.   unknown  . prochlorperazine (COMPAZINE) 10 MG tablet Take 1 tablet (10 mg total) by mouth every 6 (six) hours as needed for nausea or vomiting. 30 tablet 2 10/08/2016  . warfarin (COUMADIN) 5 MG tablet Take 1 tablet (5 mg total) by mouth daily. 30 tablet 6 10/10/2016 at 0800   Scheduled: . allopurinol  300 mg Oral Daily  . calcium chloride  IV  1 g Intravenous Once  . dextrose      . dextrose      . dextrose      . dextrose      . dextrose      . febuxostat  40 mg Oral Daily  . feeding supplement  1 Container Oral TID BM  . feeding supplement (ENSURE ENLIVE)  237 mL Oral BID BM  . LORazepam      . LORazepam  2 mg Intravenous Once  .  morphine injection  4 mg Intravenous Once  . morphine      . pantoprazole  40 mg Oral Daily  . piperacillin-tazobactam (ZOSYN)  IV  3.375 g Intravenous Once  . simvastatin  10 mg Oral QHS  . warfarin  5 mg Oral Once  . Warfarin -  Pharmacist Dosing Inpatient   Does not apply Q24H   Continuous: . sodium chloride    . norepinephrine (LEVOPHED) Adult infusion 40 mcg/min (10/13/16 0600)  . phenylephrine (NEO-SYNEPHRINE) Adult infusion 200 mcg/min (10/13/16 0600)  .  sodium bicarbonate  infusion 1000 mL 75 mL/hr at 10/13/16 0600   YTK:[id number] **OR** acetaminophen, ALPRAZolam, ipratropium-albuterol, nitroGLYCERIN, ondansetron **OR** ondansetron (ZOFRAN) IV, polyvinyl alcohol, senna-docusate, traMADol  Assesment: He was admitted with chemotherapy-induced neutropenia nausea vomiting and diarrhea and dehydration. He had sudden acute deterioration in the night and ended up intubated and on the ventilator. He appears that he may be in DIC. Platelets are low hemoglobin slowly he still has low white blood cell count. He had blood in his stool when he had rectal temperature done and he has had blood in his endotracheal tube. He is septic with his lactate level being 12.4 earlier this morning blood pressure being low and  he is now on 3 pressors. Liver function has substantially deteriorated. Liver function is abnormal. Markedly elevated transaminases. With the rales in his chest I'm concerned that he may have pneumonia now  with his low white blood cell count he may not have enough of an immune response to generate an infiltrate. At baseline he has a lung cancer on treatment. Additionally he is known to have chronic systolic heart failure and has had atrial fib. He has an implantable cardioverter/defibrillator in place. He obviously has multisystem failure. His chances of survival are small. Active Problems:   Dyslipidemia   Essential hypertension, benign   Implantable cardioverter-defibrillator (ICD) in situ   Chronic kidney disease (CKD), stage II (mild)   Chronic systolic CHF (congestive heart failure) (HCC)   Diarrhea   SIRS (systemic inflammatory response syndrome) (HCC)   Leukopenia   AKI (acute kidney injury) ([place])    Cardiac arrest (HCC)   Chemotherapy-induced neutropenia (HCC)    Plan: Continue pressors. Continue fluid replacement. Recheck labs which is already been ordered. I added steroids. He lists an allergy to prednisone but that was nausea not a true allergy so I think we can safely give him steroids.    LOS: 1 day   Carma Dwiggins L 10/13/2016, 7:46 AM

## 2016-10-13 NOTE — Progress Notes (Signed)
Patient ID: Adam House, male   DOB: 10-Sep-1951, 66 y.o.   MRN: [id number] Labs from [facility] from ED visit 09/19/2016 alb 3.4, gap 21 alt/ast normal, total bili nml co2 20 cr 2.04 hgb 14.7 lipase nml inr 1.5 plt 58 k 4.5 na 129  Wbc 0.2 abg ph 7.33 pco2 27 po2 73 given vancomycin at [facility]   Presented to [facility] at 819am with aching all over, n/v/d for over a day.  nonsmall cell lung cancer on chemo etoposide 3rd treatment on 1/18, cannot find onc notes here in epic.  Transferred to our ED to be admitted here for dehydration, diarrhea.  Pt was noted to be cyanotic in their ED, o2 sats were unable to be accurately measured due to cold extremities, 2liters placed and cyanosis resolved.  Pt was hypotensive, tachycardic with temp of 97.6.  Given 2.5 liters ivf and bp improved. Here in our ED lactic acid level checked was 2.4.  Zosyn iv was given in ED here.  Pt admitted to med floor.  Appears thru the night RN called floor coverage for low bp twice, at which time ivf boluses were initiated.  Reported that oxygen sats were unable to be picked up thru the night and pt was complaining of "pain all over".  RN went to give him xanax with a drink of water when he suddenly became unresponsive and pulseless, code blue was called dr Wyvonnia Dusky first arrived from the ED pt was found to be in PEA arrest was intubated around 1230am.  Please see dr Wyvonnia Dusky notes and code notes for details.  SROS was obtained, pt was transferred to [facility] ICU.  At this point he was on amiodorone drip after load, dopamine drip.   Not long after arrival to ICU he went into PEA arrest again, responded to several doses of epinephrine, please see RN notes for code details.  This was approximately 110am.    Since that time pt has been on max doses of dopamine, levophed , several rounds of bicarb, neo has also been started.  Repeat labs reveal markedly elevated lfts now, increased coags, hgb has dropped to less than 6, plts also less than 20.   amio drip was stopped as bp continued to be low, initial abg with ph of 6.9.  I have discussed case several time thruout the night with dr sommer PCC[facility] at [facility].  Pt intially had several glucose levels during initial code blue of less than 10.  Pt is not diabetic and was not given any insulin products during stay here.  No known liver disease per chart or family.  Pt in multiorgan system failure, unclear if this is toxicity from chemo treatments, septic shock, tumor lysis syndrome (uric acid only 4, but phos is 11 pt was on uloric), DIC.  Have started on broad abx vanc/zosyn.  abg has improved (pt was with MAP less than 60 for approx 2 hours despite multiple pressors on board) and with resolution of acidosis his bp has started to improve.  Rectal temp was done noted to have some bright red blood and temp was 92, bare hugger has been started.  Glucose levels have been labile and highly variable,  Glucometer readings have gone up to 300 range, bmp drawn from PICC line is 1300(suspect residual glucose in line from multiple amps of d50 given earlier )  Have not given any insulin yet due to severe hypoglycemia earlier (gluc was less than 10 on several different glucometer reads)  monitor glucose hourly ,  check bmp q 4 hours.   Recheck abg ordered for 6am.   Wean down pressors as tolerates to keep MAP above 65 Transfuse 2 units prbcs, one unit FFP and one unit of platelets now Check fibrinogen level Reck phos level Repeat  lfts tonight reveal further elevation of ast/alt, low albumin Check acute hep panel Check stem test (has not been on chronic steroids) Monitor closely for any bleeding Check proinsulin level Serial lactic acid levels Monitor procalciton level Iv vanc/zosyn nuelasta given yesterday Obtain oncology consult Obtain GI consult Several EKGs done thru the night no STEMI Bare hugger due to temp 92  Have discussed care with wife and daughter who is CNA at [employer] several times  thru the night.  They wish to keep him here to make sure he remains stable before considering transporting to [facility] for critical care management.  They understand he has a poor prognosis but they are still hopefully optimistic at this time.  Continue max treatment for now.         Cc time total 150 minutes

## 2016-10-13 NOTE — Progress Notes (Signed)
ANTIBIOTIC CONSULT NOTE-Preliminary  Pharmacy Consult for Vancomycin and Zosyn Indication: Sepsis  Allergies  Allergen Reactions  . Lisinopril Anaphylaxis and Swelling    angioedema  . Naproxen Hives  . Prednisone Nausea And Vomiting and Other (See Comments)    "I felt like my stomach was in knots, very painful"    Patient Measurements: Height: '5\' 9"'$  (175.3 cm) Weight: 177 lb 14.6 oz (80.7 kg) IBW/kg (Calculated) : 70.7   Vital Signs: Temp: 98.3 F (36.8 C) (01/28 2147) Temp Source: Axillary (01/28 2147) BP: 75/31 (01/29 0155) Pulse Rate: 32 (01/29 0155)  Labs:  Recent Labs  10/13/16 0116  WBC 0.8*  HGB 5.8*  PLT 14*  CREATININE 3.07*    Estimated Creatinine Clearance: 24 mL/min (by C-G formula based on SCr of 3.07 mg/dL (H)).  No results for input(s): VANCOTROUGH, VANCOPEAK, VANCORANDOM, GENTTROUGH, GENTPEAK, GENTRANDOM, TOBRATROUGH, TOBRAPEAK, TOBRARND, AMIKACINPEAK, AMIKACINTROU, AMIKACIN in the last 72 hours.   Microbiology: No results found for this or any previous visit (from the past 720 hour(s)).  Medical History: Past Medical History:  Diagnosis Date  . Arthritis    gout  . Asthma   . Atrial fibrillation ([place])   . CAD (coronary artery disease)    a. ant MI 1998 with PCI to LAD. b. Ant-lat MI 04/2012 tx with overlapping DES to LAD.  [name] CHF (congestive heart failure) ([place])   . CRI (chronic renal insufficiency)   . CVA (cerebral infarction) 11/2010  . DVT (deep venous thrombosis) ([place])   . Esophageal reflux   . Gout   . Heart murmur   . Hypercholesteremia   . ICD (implantable cardiac defibrillator) in place   . Impotence of organic origin   . Ischemic cardiomyopathy    EF 25-30% by echo 04/2012; [place]  . Lung disease, interstitial ([place])   . Myocardial infarction 1998 and 2005  . Peripheral arterial disease ([place])   . Pneumonia    hx  . 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 Right internal carotid occlusion   . Stroke [facility]) ?  2006  . Systolic CHF (HCC)    NYHA Class II/III  . Tobacco use disorder   . Unspecified essential hypertension     Medications:  Zosyn 3.375 Gm IV x 1 dose in the ED on 1/28 at 1330  Assessment: 66 yo male seen in the ED for hypotension, weakness and with profound neutropenia s/p recent chemotherapy for lung cancer. Pt was admitted and experienced a cardiac arrest with PEA. Pt is now s/p recuscitation and is to be given Vancomycin and Zosyn for sepsis.  Goal of Therapy:  Vancomycin troughs 15-20 mcg/ml Eradicate infection  Plan:  Preliminary review of pertinent patient information completed.  Protocol will be initiated with one-time doses of Vancomycin 1 Gm IV and Zosyn 3.375 Gm IV.  Forestine Na clinical pharmacist will complete review during morning rounds to assess patient and finalize treatment regimen.  Norberto Sorenson, [facility] 10/13/2016,3:04 AM

## 2016-10-13 NOTE — Consult Note (Signed)
[facility] Consultation Oncology  Name: Adam House      MRN: [id number]    Location: IC07/IC07-01  Date: 10/13/2016 Time:5:10 PM   REFERRING PHYSICIAN:  Steward Ros, MD ([facility])  REASON FOR CONSULT:  66 yo with possible liver failure from chemotherapy   DIAGNOSIS:  Limited stage small cell lung cancer  HISTORY OF PRESENT ILLNESS:  Adam House is a 66 yo black American with a past medical history significant for HTN, hyperlipidemia, AMI, CAD, AAA, tobacco abuse, A-fib who is known to the [facility] for limited stage small cell lung cancer.    Small cell lung cancer, left ([place])   08/27/2016 PET scan    1. 2.8 cm left lower lobe pulmonary nodule, SUV 22.7. Hypermetabolic left infrahilar, subcarinal, hilar, and paraesophageal adenopathy. Assuming non-small cell lung cancer, this represents T1b N2 M0 disease (stage IIIA). 2. Low-grade inflammatory activity associated with the ground-glass opacities and interstitial accentuation in the lung bases. 3. Coronary, aortic arch, and branch vessel atherosclerotic vascular disease. Aortoiliac atherosclerotic vascular disease. Infrarenal abdominal aortic ectasia. 4. Renal cysts including 1 complex cyst which appears photopenic/benign. 5. Sigmoid diverticulosis. 6. Small focus of activity on the left at L3-4 is likely degenerative.      09/24/2016 Procedure    FINE NEEDLE ASPIRATION, ENDOSCOPIC (B) 7 NODE (SPECIMEN 2 OF 2, COLLECTED ON 09/24/16):      09/26/2016 Pathology Results    MALIGNANT CELLS CONSISTENT WITH SMALL CELL UNDIFFERENTIATED CARCINOMA.      10/02/2016 -  Chemotherapy    The patient had palonosetron (ALOXI) injection 0.25 mg, 0.25 mg, Intravenous,  Once, 1 of 4 cycles  pegfilgrastim (NEULASTA ONPRO KIT) injection 6 mg, 6 mg, Subcutaneous, Once, 1 of 4 cycles  CISplatin (PLATINOL) 121 mg in sodium chloride 0.9 % 500 mL chemo infusion, 60 mg/m2 = 121 mg (75 % of original dose 80 mg/m2),  Intravenous,  Once, 1 of 4 cycles Dose modification: 60 mg/m2 (original dose 80 mg/m2, Cycle 1, Reason: Dose not tolerated, Comment: CrCl= 53. Dose reduced to 75 %)  etoposide (VEPESID) 200 mg in sodium chloride 0.9 % 500 mL chemo infusion, 100 mg/m2 = 200 mg, Intravenous,  Once, 1 of 4 cycles  fosaprepitant (EMEND) 150 mg, dexamethasone (DECADRON) 12 mg in sodium chloride 0.9 % 145 mL IVPB, , Intravenous,  Once, 1 of 4 cycles  for chemotherapy treatment.        10/08/2016 Imaging    CT head- 1. No acute intracranial process or CT findings of intracranial metastasis. 2. Mild global parenchymal brain volume loss. 3. 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 4. 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      10/07/2016 -  Hospital Admission    Admit date: 10/10/2016 Admission diagnosis: Diarrhea, generalized weakness Additional comments: PEA requiring intubation and pressors.       Patient is intubated and sedated.  Therefore, he is noncontributory during discussion.  The patient's wife is present.  She reports nausea with vomiting beginning on Saturday and diarrhea.  They presented to [facility] ED and was subsequently transferred to [facility] ED for further evaluation with admission.  Chart is reviewed in detail.    PAST MEDICAL HISTORY:   Past Medical History:  Diagnosis Date  . Arthritis    gout  . Asthma   . Atrial fibrillation ([place])   . CAD (coronary artery disease)    a. ant MI 51 with  PCI to LAD. b. Ant-lat MI 04/2012 tx with overlapping DES to LAD.  [name] CHF (congestive heart failure) ([place])   . CRI (chronic renal insufficiency)   . CVA (cerebral infarction) 11/2010  . DVT (deep venous thrombosis) ([place])   . Esophageal reflux   . Gout   . Heart murmur   . Hypercholesteremia   . ICD (implantable cardiac defibrillator) in place   . Impotence of organic origin   .  Ischemic cardiomyopathy    EF 25-30% by echo 04/2012; [place]  . Lung disease, interstitial ([place])   . Myocardial infarction 1998 and 2005  . Peripheral arterial disease ([place])   . Pneumonia    hx  . 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 Right internal carotid occlusion   . Stroke [facility]) ? 2006  . Systolic CHF ([facility])    NYHA Class II/III  . Tobacco use disorder   . Unspecified essential hypertension     ALLERGIES: Allergies  Allergen Reactions  . Lisinopril Anaphylaxis and Swelling    angioedema  . Naproxen Hives  . Prednisone Nausea And Vomiting and Other (See Comments)    "I felt like my stomach was in knots, very painful"      MEDICATIONS: I have reviewed the patient's current medications.     PAST SURGICAL HISTORY Past Surgical History:  Procedure Laterality Date  . ANTERIOR CERVICAL DECOMP/DISCECTOMY FUSION  2007   "put in 4 screws to hold my head on; ruptured C5; job related injury" (08/24/2012)  . CARDIAC DEFIBRILLATOR PLACEMENT  2011   [employer]  he is a MADIT RIT study patient  . CORONARY ANGIOPLASTY WITH STENT PLACEMENT  05/1997; 04/2012   "1 + 2; total of 3" (08/24/2012)  . EMBOLECTOMY Right 06/24/2014   Procedure: RIGHT FEMORAL EMBOLECTOMY,  ;  Surgeon: Angelia Mould, MD;  [facility];  Service: Vascular;  Laterality: Right;  Right Femoral Embolectomy, with bovine patch angioplasty,.  . FASCIOTOMY Left 06/24/2014   Procedure: FASCIOTOMY;  Surgeon: Angelia Mould, MD;  Location: [place];  Service: Vascular;  Laterality: Left;  four compartment Fasciotomy.  [name] LEFT HEART CATHETERIZATION WITH CORONARY ANGIOGRAM N/A 04/22/2012   Procedure: LEFT HEART CATHETERIZATION WITH CORONARY ANGIOGRAM;  Surgeon: Leonie Man, MD;  Location: [facility] CATH LAB;  Service: Cardiovascular;  Laterality: N/A;  . PERCUTANEOUS CORONARY STENT INTERVENTION (PCI-S)  04/22/2012   Procedure: PERCUTANEOUS CORONARY STENT INTERVENTION (PCI-S);  Surgeon: Leonie Man, MD;  Location: [facility] CATH LAB;  Service: Cardiovascular;;  . VIDEO BRONCHOSCOPY WITH ENDOBRONCHIAL ULTRASOUND N/A 09/24/2016   Procedure: VIDEO BRONCHOSCOPY WITH ENDOBRONCHIAL ULTRASOUND;  Surgeon: Marshell Garfinkel, MD;  Location: [place];  Service: Pulmonary;  Laterality: N/A;    FAMILY HISTORY: Family History  Problem Relation Age of Onset  . Diabetes Mother   . Cancer Mother     Ovarian  . Heart disease Mother   . Hyperlipidemia Mother   . Hypertension Mother   . Heart attack Mother   . Heart disease Father   . Hyperlipidemia Father   . Hypertension Father   . Heart attack Father   . Heart disease Brother   . Heart attack Brother   . Cancer Sister     Ovarian-Liver-Lung-Brain  . Heart disease Sister   . Heart attack Daughter   . Heart disease Daughter     Before age 59  . Allergies Daughter   . Cancer Sister     Lung  . Heart disease  Sister   . Emphysema Paternal Grandfather     SOCIAL HISTORY:  reports that he quit smoking about 4 years ago. His smoking use included Cigarettes. He started smoking about 32 years ago. He has a 30.00 pack-year smoking history. His smokeless tobacco use includes Snuff. He reports that he does not drink alcohol or use drugs.  PERFORMANCE STATUS: The patient's performance status is 4 - Bedbound  PHYSICAL EXAM: Most Recent Vital Signs: Blood pressure 112/83, pulse (!) 107, temperature 98.1 F (36.7 C), temperature source Rectal, resp. rate (!) 26, height 5' 9" (1.753 m), weight 177 lb 14.6 oz (80.7 kg), SpO2 100 %. General appearance: appears stated age and intubated, sedated, wife at bedside Head: Normocephalic, without obvious abnormality, atraumatic Throat: intubated Lungs: clear to auscultation bilaterally Heart: irregularly irregular rhythm Extremities: extremities normal, atraumatic, no cyanosis or edema Skin: Skin color, texture, turgor normal. No rashes or lesions Neurologic: Sedated  LABORATORY DATA:  Results for orders  placed or performed during the hospital encounter of 09/20/2016 (from the past 48 hour(s))  Lactic acid, plasma     Status: Abnormal   Collection Time: 09/24/2016  1:59 PM  Result Value Ref Range   Lactic Acid, Venous 2.4 (HH) 0.5 - 1.9 mmol/L    Comment: CRITICAL RESULT CALLED TO, READ BACK BY AND VERIFIED WITH: VOGLER,T. AT 1007 ON 10/06/2016 BY EVA   Influenza panel by PCR (type A & B)     Status: None   Collection Time: 09/21/2016  2:10 PM  Result Value Ref Range   Influenza A By PCR NEGATIVE NEGATIVE   Influenza B By PCR NEGATIVE NEGATIVE    Comment: (NOTE) The Xpert Xpress Flu assay is intended as an aid in the diagnosis of  influenza and should not be used as a sole basis for treatment.  This  assay is FDA approved for nasopharyngeal swab specimens only. Nasal  washings and aspirates are unacceptable for Xpert Xpress Flu testing.   Protime-INR     Status: Abnormal   Collection Time: 10/03/2016  6:59 PM  Result Value Ref Range   Prothrombin Time 19.3 (H) 11.4 - 15.2 seconds   INR 1.61   Glucose, capillary     Status: Abnormal   Collection Time: 10/13/16 12:31 AM  Result Value Ref Range   Glucose-Capillary <10 (LL) 65 - 99 mg/dL   Comment 1 Notify RN    Comment 2 Document in Chart   Glucose, capillary     Status: Abnormal   Collection Time: 10/13/16 12:36 AM  Result Value Ref Range   Glucose-Capillary <10 (LL) 65 - 99 mg/dL  Glucose, capillary     Status: Abnormal   Collection Time: 10/13/16 12:41 AM  Result Value Ref Range   Glucose-Capillary 18 (LL) 65 - 99 mg/dL   Comment 1 Notify RN    Comment 2 Document in Chart   Glucose, capillary     Status: Abnormal   Collection Time: 10/13/16  1:03 AM  Result Value Ref Range   Glucose-Capillary 31 (LL) 65 - 99 mg/dL   Comment 1 Document in Chart   Glucose, capillary     Status: Abnormal   Collection Time: 10/13/16  1:16 AM  Result Value Ref Range   Glucose-Capillary 185 (H) 65 - 99 mg/dL  CBC with Differential/Platelet      Status: Abnormal   Collection Time: 10/13/16  1:16 AM  Result Value Ref Range   WBC 0.8 (LL) 4.0 - 10.5 K/uL    Comment:  WHITE COUNT CONFIRMED ON SMEAR RESULT REPEATED AND VERIFIED CRITICAL RESULT CALLED TO, READ BACK BY AND VERIFIED WITH:  PHILLIPS,C @ 0239 ON 10/13/16 BY JUW    RBC 2.08 (L) 4.22 - 5.81 MIL/uL   Hemoglobin 5.8 (LL) 13.0 - 17.0 g/dL    Comment: RESULT REPEATED AND VERIFIED CRITICAL RESULT CALLED TO, READ BACK BY AND VERIFIED WITH:  PHILLIPS,C @ 0239 ON 10/13/16 BY JUW    HCT 16.9 (L) 39.0 - 52.0 %   MCV 81.3 78.0 - 100.0 fL   MCH 27.9 26.0 - 34.0 pg   MCHC 34.3 30.0 - 36.0 g/dL   RDW 15.5 11.5 - 15.5 %   Platelets 14 (LL) 150 - 400 K/uL    Comment: PLATELET COUNT CONFIRMED BY SMEAR RESULT REPEATED AND VERIFIED CRITICAL RESULT CALLED TO, READ BACK BY AND VERIFIED WITH:  PHILLIPS,C @ 0239 ON 10/13/16 BY JUW    Neutrophils Relative % 3 %   Neutro Abs 0.0 (L) 1.7 - 7.7 K/uL   Lymphocytes Relative 96 %   Lymphs Abs 0.8 0.7 - 4.0 K/uL   Monocytes Relative 1 %   Monocytes Absolute 0.0 (L) 0.1 - 1.0 K/uL   Eosinophils Relative 0 %   Eosinophils Absolute 0.0 0.0 - 0.7 K/uL   Basophils Relative 0 %   Basophils Absolute 0.0 0.0 - 0.1 K/uL  Comprehensive metabolic panel     Status: Abnormal   Collection Time: 10/13/16  1:16 AM  Result Value Ref Range   Sodium 122 (L) 135 - 145 mmol/L   Potassium 6.4 (HH) 3.5 - 5.1 mmol/L    Comment: CRITICAL RESULT CALLED TO, READ BACK BY AND VERIFIED WITH:  DR.DAVID @ 0214 ON 10/13/16 BY JUW    Chloride 95 (L) 101 - 111 mmol/L   CO2 11 (L) 22 - 32 mmol/L   Glucose, Bld 1,168 (HH) 65 - 99 mg/dL    Comment: RESULT REPEATED AND VERIFIED RESULTS CONFIRMED BY MANUAL DILUTION CRITICAL RESULT CALLED TO, READ BACK BY AND VERIFIED WITH:  PHILLIPS,C @ 0239 ON 10/13/16 BY JUW    BUN 46 (H) 6 - 20 mg/dL   Creatinine, Ser 3.07 (H) 0.61 - 1.24 mg/dL   Calcium 7.5 (L) 8.9 - 10.3 mg/dL   Total Protein <3.0 (L) 6.5 - 8.1 g/dL   Albumin 1.2 (L)  3.5 - 5.0 g/dL   AST 915 (H) 15 - 41 U/L   ALT 687 (H) 17 - 63 U/L   Alkaline Phosphatase 26 (L) 38 - 126 U/L   Total Bilirubin 0.6 0.3 - 1.2 mg/dL   GFR calc non Af Amer 20 (L) >60 mL/min   GFR calc Af Amer 23 (L) >60 mL/min    Comment: (NOTE) The eGFR has been calculated using the CKD EPI equation. This calculation has not been validated in all clinical situations. eGFR's persistently <60 mL/min signify possible Chronic Kidney Disease.    Anion gap 16 (H) 5 - 15  Lactic acid, plasma     Status: Abnormal   Collection Time: 10/13/16  1:16 AM  Result Value Ref Range   Lactic Acid, Venous 11.6 (HH) 0.5 - 1.9 mmol/L    Comment: CRITICAL RESULT CALLED TO, READ BACK BY AND VERIFIED WITH:  PHILLIPS,C @ 0239 ON 10/13/16 BY JUW   Protime-INR     Status: Abnormal   Collection Time: 10/13/16  1:16 AM  Result Value Ref Range   Prothrombin Time 40.9 (H) 11.4 - 15.2 seconds   INR 4.09 (HH)  Comment: RESULT REPEATED AND VERIFIED CRITICAL RESULT CALLED TO, READ BACK BY AND VERIFIED WITH:  PHILLIPS,C @ 0212 ON 10/13/16 BY JUW   Magnesium     Status: None   Collection Time: 10/13/16  1:16 AM  Result Value Ref Range   Magnesium 1.8 1.7 - 2.4 mg/dL  Phosphorus     Status: Abnormal   Collection Time: 10/13/16  1:16 AM  Result Value Ref Range   Phosphorus 11.2 (H) 2.5 - 4.6 mg/dL  Uric acid     Status: Abnormal   Collection Time: 10/13/16  1:16 AM  Result Value Ref Range   Uric Acid, Serum 4.3 (L) 4.4 - 7.6 mg/dL  Blood gas, arterial     Status: Abnormal   Collection Time: 10/13/16  1:35 AM  Result Value Ref Range   FIO2 1.00    O2 Content 100.0 L/min   Delivery systems VENTILATOR    Mode PRESSURE REGULATED VOLUME CONTROL    VT 510 mL   LHR 16.0 resp/min   Peep/cpap 5.0 cm H20   pH, Arterial 6.942 (LL) 7.350 - 7.450    Comment: CRITICAL RESULT CALLED TO, READ BACK BY AND VERIFIED WITH: TIM GOINS,RN BY BFRETWELL RRT ON 10/14/15 AT 0142    pCO2 arterial 41.3 32.0 - 48.0 mmHg   pO2,  Arterial 58.0 (L) 83.0 - 108.0 mmHg   Bicarbonate 8.0 (L) 20.0 - 28.0 mmol/L   O2 Saturation 64.4 %   Collection site RIGHT RADIAL    Drawn by [phone]    Sample type ARTERIAL DRAW    Allens test (pass/fail) PASS PASS  Glucose, capillary     Status: Abnormal   Collection Time: 10/13/16  2:18 AM  Result Value Ref Range   Glucose-Capillary 452 (H) 65 - 99 mg/dL  Glucose, capillary     Status: Abnormal   Collection Time: 10/13/16  2:42 AM  Result Value Ref Range   Glucose-Capillary 364 (H) 65 - 99 mg/dL  Type and screen [facility]     Status: None (Preliminary result)   Collection Time: 10/13/16  3:17 AM  Result Value Ref Range   ISSUE DATE / TIME [id number]    Blood Product Unit Number [id number]    PRODUCT CODE [id number]    Unit Type and Rh 3557    Blood Product Expiration Date [id number]    ISSUE DATE / TIME [id number]    Blood Product Unit Number [id number]    PRODUCT CODE [id number]    Unit Type and Rh 0093    Blood Product Expiration Date [id number]   Prepare RBC     Status: None   Collection Time: 10/13/16  3:17 AM  Result Value Ref Range   Order Confirmation ORDER PROCESSED BY BLOOD BANK   Prepare Pheresed Platelets     Status: None (Preliminary result)   Collection Time: 10/13/16  3:17 AM  Result Value Ref Range   ISSUE DATE / TIME [id number]    Blood Product Unit Number [id number]    PRODUCT CODE [id number]    Unit Type and Rh 7300    Blood Product Expiration Date [id number]   Prepare fresh frozen plasma     Status: None (Preliminary result)   Collection Time: 10/13/16  3:17 AM  Result Value Ref Range   ISSUE DATE / TIME [id number]    Blood Product Unit Number [id number]    PRODUCT CODE [id number]    Unit Type and Rh 3419    Blood Product Expiration Date [id number]  Glucose, capillary     Status: Abnormal   Collection Time: 10/13/16  3:17 AM  Result Value Ref Range   Glucose-Capillary 347 (H) 65 - 99 mg/dL  ABO/Rh     Status:  None   Collection Time: 10/13/16  3:17 AM  Result Value Ref Range   ABO/RH(D) A POS   Blood gas, arterial     Status: Abnormal   Collection Time: 10/13/16  3:29 AM  Result Value Ref Range   FIO2 1.00    O2 Content 100.0 L/min   Delivery systems VENTILATOR    Mode PRESSURE REGULATED VOLUME CONTROL    VT 510 mL   LHR 18.0 resp/min   Peep/cpap 5.0 cm H20   pH, Arterial 7.022 (LL) 7.350 - 7.450    Comment: CRITICAL RESULT CALLED TO, READ BACK BY AND VERIFIED WITH: JAMES DANIEL,RN BY BFRETWELL RRT ON 10/13/16 AT 0338    pCO2 arterial 53.7 (H) 32.0 - 48.0 mmHg   pO2, Arterial 37.8 (LL) 83.0 - 108.0 mmHg    Comment: CRITICAL RESULT CALLED TO, READ BACK BY AND VERIFIED WITH: JAMES DANIEL,RN BY BFRETWELL RRT ON 10/13/16 AT 5038    Bicarbonate 11.4 (L) 20.0 - 28.0 mmol/L   Acid-Base Excess 15.7 (H) 0.0 - 2.0 mmol/L   Collection site RIGHT RADIAL    Drawn by [id number]    Sample type VEIN    Allens test (pass/fail) PASS PASS  Urinalysis, Routine w reflex microscopic     Status: Abnormal   Collection Time: 10/13/16  3:30 AM  Result Value Ref Range   Color, Urine YELLOW YELLOW   APPearance CLEAR CLEAR   Specific Gravity, Urine 1.015 1.005 - 1.030   pH 5.5 5.0 - 8.0   Glucose, UA NEGATIVE NEGATIVE mg/dL   Hgb urine dipstick LARGE (A) NEGATIVE   Bilirubin Urine NEGATIVE NEGATIVE   Ketones, ur NEGATIVE NEGATIVE mg/dL   Protein, ur TRACE (A) NEGATIVE mg/dL   Nitrite NEGATIVE NEGATIVE   Leukocytes, UA NEGATIVE NEGATIVE  Urinalysis, Microscopic (reflex)     Status: Abnormal   Collection Time: 10/13/16  3:30 AM  Result Value Ref Range   RBC / HPF TOO NUMEROUS TO COUNT 0 - 5 RBC/hpf   WBC, UA 0-5 0 - 5 WBC/hpf   Bacteria, UA MANY (A) NONE SEEN   Squamous Epithelial / LPF 0-5 (A) NONE SEEN  MRSA PCR Screening     Status: None   Collection Time: 10/13/16  3:31 AM  Result Value Ref Range   MRSA by PCR NEGATIVE NEGATIVE    Comment:        The GeneXpert MRSA Assay (FDA approved for NASAL  specimens only), is one component of a comprehensive MRSA colonization surveillance program. It is not intended to diagnose MRSA infection nor to guide or monitor treatment for MRSA infections.   Comprehensive metabolic panel     Status: Abnormal   Collection Time: 10/13/16  3:41 AM  Result Value Ref Range   Sodium 133 (L) 135 - 145 mmol/L    Comment: DELTA CHECK NOTED   Potassium 5.5 (H) 3.5 - 5.1 mmol/L   Chloride 101 101 - 111 mmol/L   CO2 14 (L) 22 - 32 mmol/L   Glucose, Bld 558 (HH) 65 - 99 mg/dL    Comment: CRITICAL RESULT CALLED TO, READ BACK BY AND VERIFIED WITH: DANIELS,J AT 4:25AM ON 10/13/16 BY FESTERMAN,C    BUN 46 (H) 6 - 20 mg/dL   Creatinine, Ser 3.16 (H) 0.61 -  1.24 mg/dL   Calcium 6.5 (L) 8.9 - 10.3 mg/dL   Total Protein <3.0 (L) 6.5 - 8.1 g/dL   Albumin 1.2 (L) 3.5 - 5.0 g/dL   AST 2,200 (H) 15 - 41 U/L   ALT 1,029 (H) 17 - 63 U/L   Alkaline Phosphatase 25 (L) 38 - 126 U/L   Total Bilirubin 1.0 0.3 - 1.2 mg/dL   GFR calc non Af Amer 19 (L) >60 mL/min   GFR calc Af Amer 22 (L) >60 mL/min    Comment: (NOTE) The eGFR has been calculated using the CKD EPI equation. This calculation has not been validated in all clinical situations. eGFR's persistently <60 mL/min signify possible Chronic Kidney Disease.    Anion gap 18 (H) 5 - 15  CBC     Status: Abnormal   Collection Time: 10/13/16  3:41 AM  Result Value Ref Range   WBC 0.5 (LL) 4.0 - 10.5 K/uL    Comment: RESULT REPEATED AND VERIFIED CRITICAL RESULT CALLED TO, READ BACK BY AND VERIFIED WITH:  PHILLIPS,C @ 0415 ON 10/13/16 BY JUW    RBC 2.21 (L) 4.22 - 5.81 MIL/uL   Hemoglobin 6.1 (LL) 13.0 - 17.0 g/dL    Comment: RESULT REPEATED AND VERIFIED CRITICAL RESULT CALLED TO, READ BACK BY AND VERIFIED WITH:  PHILLIPS,C @ 0415 ON 10/13/16 BY JUW    HCT 17.8 (L) 39.0 - 52.0 %   MCV 80.5 78.0 - 100.0 fL   MCH 27.6 26.0 - 34.0 pg   MCHC 34.3 30.0 - 36.0 g/dL   RDW 15.2 11.5 - 15.5 %   Platelets 18 (LL) 150 -  400 K/uL    Comment: RESULT REPEATED AND VERIFIED CRITICAL RESULT CALLED TO, READ BACK BY AND VERIFIED WITH:  PHILLIPS,C @ 0145 ON 10/13/16 BY JUW   Lactic acid, plasma     Status: Abnormal   Collection Time: 10/13/16  3:41 AM  Result Value Ref Range   Lactic Acid, Venous 12.6 (HH) 0.5 - 1.9 mmol/L    Comment: RESULTS CONFIRMED BY MANUAL DILUTION CRITICAL RESULT CALLED TO, READ BACK BY AND VERIFIED WITH: DANIELS,J AT 4:50AM ON 10/13/16 BY FESTERMAN,C   Blood gas, arterial     Status: Abnormal   Collection Time: 10/13/16  3:45 AM  Result Value Ref Range   FIO2 100.00    Delivery systems VENTILATOR    Mode PRESSURE REGULATED VOLUME CONTROL    VT 510 mL   LHR 18 resp/min   Peep/cpap 5.0 cm H20   pH, Arterial 7.204 (L) 7.350 - 7.450   pCO2 arterial 31.1 (L) 32.0 - 48.0 mmHg   pO2, Arterial 164.0 (H) 83.0 - 108.0 mmHg   Bicarbonate 12.9 (L) 20.0 - 28.0 mmol/L   Acid-base deficit 14.6 (H) 0.0 - 2.0 mmol/L   O2 Saturation 98.0 %   Patient temperature 37.0    Collection site RIGHT RADIAL    Drawn by [id number]    Sample type ARTERIAL    Allens test (pass/fail) PASS PASS  Glucose, capillary     Status: Abnormal   Collection Time: 10/13/16  5:10 AM  Result Value Ref Range   Glucose-Capillary 350 (H) 65 - 99 mg/dL  Glucose, capillary     Status: Abnormal   Collection Time: 10/13/16  9:48 AM  Result Value Ref Range   Glucose-Capillary 134 (H) 65 - 99 mg/dL  Glucose, capillary     Status: Abnormal   Collection Time: 10/13/16 11:15 AM  Result Value Ref Range  Glucose-Capillary 119 (H) 65 - 99 mg/dL  Glucose, capillary     Status: None   Collection Time: 10/13/16  1:07 PM  Result Value Ref Range   Glucose-Capillary 70 65 - 99 mg/dL  Phosphorus     Status: Abnormal   Collection Time: 10/13/16  2:12 PM  Result Value Ref Range   Phosphorus 6.2 (H) 2.5 - 4.6 mg/dL  Fibrinogen     Status: None   Collection Time: 10/13/16  2:12 PM  Result Value Ref Range   Fibrinogen 401 210 - 475  mg/dL  CBC     Status: Abnormal   Collection Time: 10/13/16  2:12 PM  Result Value Ref Range   WBC 0.4 (LL) 4.0 - 10.5 K/uL    Comment: CRITICAL RESULT CALLED TO, READ BACK BY AND VERIFIED WITH: Luisantonio,C AT 1505 BY HFLYNT 10/13/16 REPEATED TO VERIFY    RBC 4.25 4.22 - 5.81 MIL/uL   Hemoglobin 12.2 (L) 13.0 - 17.0 g/dL    Comment: POST TRANSFUSION SPECIMEN   HCT 33.9 (L) 39.0 - 52.0 %   MCV 79.8 78.0 - 100.0 fL   MCH 28.7 26.0 - 34.0 pg   MCHC 36.0 30.0 - 36.0 g/dL   RDW 14.5 11.5 - 15.5 %   Platelets 43 (L) 150 - 400 K/uL    Comment: SPECIMEN CHECKED FOR CLOTS CONSISTENT WITH PREVIOUS RESULT   Basic metabolic panel     Status: Abnormal   Collection Time: 10/13/16  2:25 PM  Result Value Ref Range   Sodium 138 135 - 145 mmol/L   Potassium 3.8 3.5 - 5.1 mmol/L    Comment: DELTA CHECK NOTED   Chloride 101 101 - 111 mmol/L   CO2 17 (L) 22 - 32 mmol/L   Glucose, Bld 114 (H) 65 - 99 mg/dL   BUN 47 (H) 6 - 20 mg/dL   Creatinine, Ser 3.80 (H) 0.61 - 1.24 mg/dL   Calcium 6.7 (L) 8.9 - 10.3 mg/dL   GFR calc non Af Amer 15 (L) >60 mL/min   GFR calc Af Amer 18 (L) >60 mL/min    Comment: (NOTE) The eGFR has been calculated using the CKD EPI equation. This calculation has not been validated in all clinical situations. eGFR's persistently <60 mL/min signify possible Chronic Kidney Disease.    Anion gap 20 (H) 5 - 15  Lactic acid, plasma     Status: Abnormal   Collection Time: 10/13/16  2:30 PM  Result Value Ref Range   Lactic Acid, Venous 11.4 (HH) 0.5 - 1.9 mmol/L    Comment: RESULTS CONFIRMED BY MANUAL DILUTION  Glucose, capillary     Status: Abnormal   Collection Time: 10/13/16  4:48 PM  Result Value Ref Range   Glucose-Capillary <10 (LL) 65 - 99 mg/dL      RADIOGRAPHY: Dg Chest Port 1 View  Result Date: 10/13/2016 CLINICAL DATA:  66 year old male status post CPR. History of sepsis. EXAM: PORTABLE CHEST 1 VIEW COMPARISON:  Chest x-ray 10/13/2016. FINDINGS: An endotracheal  tube is in place with tip 3.8 cm above the carina. There is a right upper extremity PICC with tip terminating in the mid superior vena cava. Left-sided pacemaker/AICD with lead tips projecting over the expected location of the right atrium and right ventricular apex. There is cephalization of the pulmonary vasculature, indistinctness of the interstitial markings, and patchy airspace disease throughout the lungs bilaterally suggestive of moderate pulmonary edema. Low lung volumes. Mild cardiomegaly. Upper mediastinal contours are within normal limits.  No definite pneumothorax. Orthopedic fixation hardware in the lower cervical spine. Electronic device projecting over the lower right hemithorax. IMPRESSION: 1. Support apparatus, as above. 2. The appearance the chest suggests mild to moderate congestive heart failure. Electronically Signed   By: Vinnie Langton M.D.   On: 10/13/2016 10:19   Dg Chest Port 1 View  Result Date: 10/13/2016 CLINICAL DATA:  Endotracheal tube placement. EXAM: PORTABLE CHEST 1 VIEW COMPARISON:  Chest from acute abdomen series yesterday. FINDINGS: Endotracheal tube 3.9 cm from the carina. Tip of the right central line in mid SVC. 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 The heart is enlarged. Bilateral lung base interstitial opacities again seen. Increasing opacities throughout the right lung and left infrahilar region. Left lower lobe pulmonary nodule on prior PET-CT not visualized radiographically. No definite pleural fluid. No pneumothorax. IMPRESSION: 1. Endotracheal tube 3.9 cm from the carina. 2. Stable cardiomegaly. Increasing opacities throughout the right lung and left infrahilar region, favor vascular congestion over aspiration or pneumonia. Electronically Signed   By: Jeb Levering M.D.   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   His treatment course has been complicated by a delayed referral to medical oncology resulting in a quick work-up for initiation of treatment, complicated by poor weather resulting in delay in PICC placement, and start of treatment.  Treatment was initiated within the standard of care window (7 days from time of diagnosis) but due to the aforementioned difficulties, there is a lack of oncology documentation at this time. He had an outpatient appointment in the cancer clinic today, but was admitted yesterday.  Oncology history is developed.  Chemotherapy on HOLD at this time.  Sepsis with DIC.  On broad spectrum antibiotics.  Respiratory failure, intubated and sedated.  Renal failure, likely secondary to dehydration  Elevated lactic acid  Patient and plan discussed with Dr. Twana First and she is in agreement with the aforementioned.   KEFALAS,THOMAS, PA-C 5:29 PM 10/13/2016   Attestation: Patient is critically ill with chemotherapy induced neutropenia, sepsis, DIC, and multiorgan failure. Currently intubated/sedated after PEA arrest. Recommend to rule out PE as a cause of his PEA arrest. Will check a d-dimer and if elevated will get either VQ scan or CTA PE protocol. Patient received neulasta with his chemotherapy last week. Continue to monitor daily labs including CBC with, CMP. Patient seen and examined with Robynn Pane yesterday 10/14/15 at 4 pm.  Case discussed in detail with his wife at bedside. Twana First, MD

## 2016-10-14 ENCOUNTER — Inpatient Hospital Stay (HOSPITAL_COMMUNITY): Payer: Medicare Other

## 2016-10-14 ENCOUNTER — Ambulatory Visit: Payer: Medicare Other | Admitting: Pulmonary Disease

## 2016-10-14 LAB — COMPREHENSIVE METABOLIC PANEL
ALT: 2141 U/L — ABNORMAL HIGH (ref 17–63)
AST: 4833 U/L — ABNORMAL HIGH (ref 15–41)
Albumin: 1.9 g/dL — ABNORMAL LOW (ref 3.5–5.0)
Alkaline Phosphatase: 73 U/L (ref 38–126)
Anion gap: 19 — ABNORMAL HIGH (ref 5–15)
BUN: 54 mg/dL — ABNORMAL HIGH (ref 6–20)
CHLORIDE: 96 mmol/L — AB (ref 101–111)
CO2: 21 mmol/L — AB (ref 22–32)
CREATININE: 4.75 mg/dL — AB (ref 0.61–1.24)
Calcium: 5.8 mg/dL — CL (ref 8.9–10.3)
GFR calc non Af Amer: 12 mL/min — ABNORMAL LOW (ref >60)
GFR, EST AFRICAN AMERICAN: 14 mL/min — AB (ref >60)
Glucose, Bld: 108 mg/dL — ABNORMAL HIGH (ref 65–99)
POTASSIUM: 4.7 mmol/L (ref 3.5–5.1)
SODIUM: 136 mmol/L (ref 135–145)
Total Bilirubin: 4.3 mg/dL — ABNORMAL HIGH (ref 0.3–1.2)
Total Protein: 4 g/dL — ABNORMAL LOW (ref 6.5–8.1)

## 2016-10-14 LAB — PREPARE FRESH FROZEN PLASMA
BLOOD PRODUCT EXPIRATION DATE: 201802032359
ISSUE DATE / TIME: 201801290436
Unit Type and Rh: 6200

## 2016-10-14 LAB — TYPE AND SCREEN
BLOOD PRODUCT EXPIRATION DATE: 201802152359
Blood Product Expiration Date: 201802152359
ISSUE DATE / TIME: 201801290507
ISSUE DATE / TIME: 201801290829
UNIT TYPE AND RH: 6200
Unit Type and Rh: 6200

## 2016-10-14 LAB — CBC
HEMATOCRIT: 33 % — AB (ref 39.0–52.0)
HEMOGLOBIN: 12.1 g/dL — AB (ref 13.0–17.0)
MCH: 28.9 pg (ref 26.0–34.0)
MCHC: 36.7 g/dL — ABNORMAL HIGH (ref 30.0–36.0)
MCV: 78.8 fL (ref 78.0–100.0)
Platelets: 27 10*3/uL — CL (ref 150–400)
RBC: 4.19 MIL/uL — AB (ref 4.22–5.81)
RDW: 15 % (ref 11.5–15.5)
WBC: 0.9 10*3/uL — AB (ref 4.0–10.5)

## 2016-10-14 LAB — PREPARE PLATELET PHERESIS
Blood Product Expiration Date: 201801292359
ISSUE DATE / TIME: 201801291136
UNIT TYPE AND RH: 7300

## 2016-10-14 LAB — HEPATITIS PANEL, ACUTE
HCV Ab: <0.1 s/co ratio (ref 0.0–0.9)
HEP A IGM: NEGATIVE
HEP B C IGM: NEGATIVE
HEP B S AG: NEGATIVE

## 2016-10-14 LAB — PROTIME-INR
INR: 3.79
PROTHROMBIN TIME: 38.3 s — AB (ref 11.4–15.2)

## 2016-10-14 LAB — GLUCOSE, CAPILLARY: Glucose-Capillary: 76 mg/dL (ref 65–99)

## 2016-10-14 LAB — D-DIMER, QUANTITATIVE: D-Dimer, Quant: >20 ug/mL-FEU — ABNORMAL HIGH (ref 0.00–0.50)

## 2016-10-14 LAB — LACTIC ACID, PLASMA: LACTIC ACID, VENOUS: 9.4 mmol/L — AB (ref 0.5–1.9)

## 2016-10-14 MED ORDER — PIPERACILLIN SOD-TAZOBACTAM SO 2.25 (2-0.25) G IV SOLR
2.2500 g | Freq: Three times a day (TID) | INTRAVENOUS | Status: DC
  Filled 2016-10-14 (×4): qty 2.25

## 2016-10-14 MED ORDER — DEXTROSE 50 % IV SOLN
INTRAVENOUS | Status: AC
  Administered 2016-10-14: 50 mL via INTRAVENOUS
  Filled 2016-10-14: qty 50

## 2016-10-14 MED ORDER — PHENYLEPHRINE HCL 10 MG/ML IJ SOLN
INTRAMUSCULAR | Status: AC
  Filled 2016-10-14: qty 4

## 2016-10-14 MED ORDER — SODIUM CHLORIDE 0.9 % IV SOLN
10.0000 ug/h | INTRAVENOUS | Status: DC
  Filled 2016-10-14: qty 50

## 2016-10-14 MED ORDER — VANCOMYCIN HCL IN DEXTROSE 1-5 GM/200ML-% IV SOLN: 1000.0000 mg | INTRAVENOUS | Status: DC

## 2016-10-14 MED ORDER — DEXTROSE 50 % IV SOLN
1.0000 | Freq: Once | INTRAVENOUS | Status: AC
  Administered 2016-10-14: 50 mL via INTRAVENOUS

## 2016-10-15 LAB — GLUCOSE, CAPILLARY: Glucose-Capillary: 13 mg/dL — CL (ref 65–99)

## 2016-10-16 NOTE — Consult Note (Signed)
Adam A. Merlene Laughter, MD     [email or web address]          Adam House is an 66 y.o. male.   ASSESSMENT/PLAN: 1. The patient is moderately encephalopathic. He has recovered significantly since yesterday from his severe encephalopathy. This is multifactorial including multiple metabolic derangements and the effects of anoxia. It appears however that he has decent neurological function that is preserved at this point. Unfortunately, he has severe and multiple metabolic and multisystem organ failure. His long-term prognosis really depends upon his multiple system organ failure and metabolic problems.  2. Left hemiplegia which could be reactivation of old stroke symptoms or a new event. It is undetermined at this time. It is also a possibility that he could have a Todd's paralysis given that he's had a large vessel cortical infarcts commiserate with left hemiplegia (right MCA infarct).   We'll continue to follow the patient's neurological exam. An EEG will be obtained.   The patient is a 67 year old black male who presented to the hospital with diarrhea and generalized weakness. He is currently undergoing chemotherapy for lung cancer. The patient had 2 episodes of cardiopulmonary arrest on yesterday and the day before and there has been encephalopathic severely since then. He was noted to have fixed and dilated pupils and unresponsive with some evidence of posture and per the history of the physician and nurse. Hence the neurological consultation. The patient is on 3 pressors and still is hypotensive. He has multiple metabolic derangements and abnormalities including been acidemic, respiratory and metabolic acidosis, acute renal failure and elevated liver enzymes. INR is also elevated. It appears that he has multisystem organ failure. The patient was noted to move all 4 extremities on presentation. However, yesterday he was noted to have left-sided hemiparesis. He does have a  previous infarct several years ago and was treated at [facility]. Extensive family at the bedside. They think that he is more responsive today.   GENERAL: The patient is intubated and on multiple drips. He is not sedated however.  HEENT: Supple. Atraumatic normocephalic.   ABDOMEN: soft  EXTREMITIES: No edema   BACK: Normal.  SKIN: Normal by inspection.    MENTAL STATUS: The patient did open eyes a couple times to deep painful stimuli. He did focus on the direction of verbal command and he did follow commands 2 times by holding up 2 fingers on the right side.  CRANIAL NERVES: Pupils are equal, round and reactive to light; extraocular movements appears to be full with oculocephalic reflexes; there is absent corneal reflexes on the left but preserved on the right; facial muscle strength is symmetric. He is breathing over the ventilator.  MOTOR: He moves the right side well. There is a left hemiplegia.  COORDINATION: Left finger to nose is normal, right finger to nose is normal, No rest tremor; no intention tremor; no postural tremor; no bradykinesia.  REFLEXES: Deep tendon reflexes are symmetrical and normal.   SENSATION: He responds to painful some eye on the right. There is no response on the left.     Blood pressure 114/77, pulse (!) 101, temperature (!) 100.9 F (38.3 C), resp. rate (!) 29, height 5' 9" (1.753 m), weight 203 lb 14.8 oz (92.5 kg), SpO2 99 %.  Past Medical History:  Diagnosis Date  . Arthritis    gout  . Asthma   . Atrial fibrillation ([place])   . CAD (coronary artery disease)    a. ant MI 1998 with PCI to LAD.  b. Ant-lat MI 04/2012 tx with overlapping DES to LAD.  [name] CHF (congestive heart failure) ([place])   . CRI (chronic renal insufficiency)   . CVA (cerebral infarction) 11/2010  . DVT (deep venous thrombosis) ([place])   . Esophageal reflux   . Gout   . Heart murmur   . Hypercholesteremia   . ICD (implantable cardiac defibrillator) in place     . Impotence of organic origin   . Ischemic cardiomyopathy    EF 25-30% by echo 04/2012; [place]  . Lung disease, interstitial ([place])   . Myocardial infarction 1998 and 2005  . Peripheral arterial disease ([place])   . Pneumonia    hx  . 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 Right internal carotid occlusion   . Stroke [facility]) ? 2006  . Systolic CHF ([facility])    NYHA Class II/III  . Tobacco use disorder   . Unspecified essential hypertension     Past Surgical History:  Procedure Laterality Date  . ANTERIOR CERVICAL DECOMP/DISCECTOMY FUSION  2007   "put in 4 screws to hold my head on; ruptured C5; job related injury" (08/24/2012)  . CARDIAC DEFIBRILLATOR PLACEMENT  2011   [employer]  he is a MADIT RIT study patient  . CORONARY ANGIOPLASTY WITH STENT PLACEMENT  05/1997; 04/2012   "1 + 2; total of 3" (08/24/2012)  . EMBOLECTOMY Right 06/24/2014   Procedure: RIGHT FEMORAL EMBOLECTOMY,  ;  Surgeon: Angelia Mould, MD;  Location: [facility] OR;  Service: Vascular;  Laterality: Right;  Right Femoral Embolectomy, with bovine patch angioplasty,.  . FASCIOTOMY Left 06/24/2014   Procedure: FASCIOTOMY;  Surgeon: Angelia Mould, MD;  Location: [facility];  Service: Vascular;  Laterality: Left;  four compartment Fasciotomy.  [name] LEFT HEART CATHETERIZATION WITH CORONARY ANGIOGRAM N/A 04/22/2012   Procedure: LEFT HEART CATHETERIZATION WITH CORONARY ANGIOGRAM;  Surgeon: Leonie Man, MD;  Location: [facility] CATH LAB;  Service: Cardiovascular;  Laterality: N/A;  . PERCUTANEOUS CORONARY STENT INTERVENTION (PCI-S)  04/22/2012   Procedure: PERCUTANEOUS CORONARY STENT INTERVENTION (PCI-S);  Surgeon: Leonie Man, MD;  Location: [facility] CATH LAB;  Service: Cardiovascular;;  . VIDEO BRONCHOSCOPY WITH ENDOBRONCHIAL ULTRASOUND N/A 09/24/2016   Procedure: VIDEO BRONCHOSCOPY WITH ENDOBRONCHIAL ULTRASOUND;  Surgeon: Marshell Garfinkel, MD;  Location: [place];  Service: Pulmonary;  Laterality: N/A;    Family History   Problem Relation Age of Onset  . Diabetes Mother   . Cancer Mother     Ovarian  . Heart disease Mother   . Hyperlipidemia Mother   . Hypertension Mother   . Heart attack Mother   . Heart disease Father   . Hyperlipidemia Father   . Hypertension Father   . Heart attack Father   . Heart disease Brother   . Heart attack Brother   . Cancer Sister     Ovarian-Liver-Lung-Brain  . Heart disease Sister   . Heart attack Daughter   . Heart disease Daughter     Before age 16  . Allergies Daughter   . Cancer Sister     Lung  . Heart disease Sister   . Emphysema Paternal Grandfather     Social History:  reports that he quit smoking about 4 years ago. His smoking use included Cigarettes. He started smoking about 32 years ago. He has a 30.00 pack-year smoking history. His smokeless tobacco use includes Snuff. He reports that he does not drink alcohol or use drugs.  Allergies:  Allergies  Allergen Reactions  .  Lisinopril Anaphylaxis and Swelling    angioedema  . Naproxen Hives  . Prednisone Nausea And Vomiting and Other (See Comments)    "I felt like my stomach was in knots, very painful"    Medications: Prior to Admission medications   Medication Sig Start Date End Date Taking? Authorizing Provider  albuterol (PROVENTIL HFA;VENTOLIN HFA) 108 (90 Base) MCG/ACT inhaler Inhale 2 puffs into the lungs every 6 (six) hours as needed for wheezing or shortness of breath. 05/13/16  Yes Herminio Commons, MD  allopurinol (ZYLOPRIM) 300 MG tablet Take 300 mg by mouth daily. 08/25/15  Yes Historical Provider, MD  ALPRAZolam Duanne Moron) 0.5 MG tablet Take 0.5 mg by mouth 2 (two) times daily as needed for anxiety.   Yes Historical Provider, MD  colchicine 0.6 MG tablet Take 0.6 mg by mouth daily.   Yes Historical Provider, MD  ETOPOSIDE IV Inject into the vein. Days 1-3 every 21 days   Yes Historical Provider, MD  furosemide (LASIX) 20 MG tablet Take 1 tablet (20 mg total) by mouth daily. 11/13/15   Yes Herminio Commons, MD  ipratropium (ATROVENT) 0.02 % nebulizer solution Take 0.5 mg by nebulization every 6 (six) hours as needed for wheezing or shortness of breath.   Yes Historical Provider, MD  isosorbide mononitrate (IMDUR) 30 MG 24 hr tablet Take 1 tablet (30 mg total) by mouth daily. 10/24/15  Yes Herminio Commons, MD  omeprazole (PRILOSEC) 20 MG capsule Take 20 mg by mouth daily. 11/30/15  Yes Historical Provider, MD  simvastatin (ZOCOR) 10 MG tablet Take 1 tablet (10 mg total) by mouth at bedtime. 09/24/15  Yes Herminio Commons, MD  sotalol (BETAPACE) 120 MG tablet Take 120 mg by mouth 2 (two) times daily. 10/01/15  Yes Historical Provider, MD  ULORIC 40 MG tablet Take 1 tablet by mouth daily. 01/02/16  Yes Historical Provider, MD  NITROSTAT 0.4 MG SL tablet PLACE 1 TABLET UNDER TONGUE EVERY 5 MINUTES AS NEEDED FOR CHEST PAIN (UP TO 3 DOSES) 03/12/16   Herminio Commons, MD  ondansetron (ZOFRAN) 8 MG tablet Take 1 tablet (8 mg total) by mouth every 8 (eight) hours as needed for nausea or vomiting. 10/01/16   Patrici Ranks, MD  Pegfilgrastim (NEULASTA ONPRO [place]) Inject into the skin. Every 21 days - to be administered 27 hours post chemo completion    Historical Provider, MD  potassium chloride (K-DUR) 10 MEQ tablet Take 10 mEq by mouth 2 (two) times daily.    Historical Provider, MD  prochlorperazine (COMPAZINE) 10 MG tablet Take 1 tablet (10 mg total) by mouth every 6 (six) hours as needed for nausea or vomiting. 10/01/16   Patrici Ranks, MD  warfarin (COUMADIN) 5 MG tablet Take 1 tablet (5 mg total) by mouth daily. 09/26/14   Thompson Grayer, MD    Scheduled Meds: . allopurinol  300 mg Oral Daily  . calcium chloride  IV  1 g Intravenous Once  . febuxostat  40 mg Oral Daily  . feeding supplement  1 Container Oral TID BM  . feeding supplement (ENSURE ENLIVE)  237 mL Oral BID BM  . hydrocortisone sod succinate (SOLU-CORTEF) inj  50 mg Intravenous Q8H  . pantoprazole (PROTONIX) IV   40 mg Intravenous Q24H  . piperacillin-tazobactam (ZOSYN)  IV  3.375 g Intravenous Q8H  . simvastatin  10 mg Oral QHS  . vancomycin  1,000 mg Intravenous Q24H   Continuous Infusions: . sodium chloride    .  dextrose 10 % 1,000 mL with sodium bicarbonate 150 mEq infusion 75 mL/hr at 20-Oct-2016 0459  . DOPamine 5 mcg/kg/min (10/13/16 1810)  . norepinephrine (LEVOPHED) Adult infusion 40 mcg/min (2016/10/20 0257)  . phenylephrine (NEO-SYNEPHRINE) Adult infusion 220 mcg/min (2016-10-20 0532)   PRN Meds:.acetaminophen **OR** acetaminophen, ALPRAZolam, ipratropium-albuterol, morphine injection, nitroGLYCERIN, ondansetron **OR** ondansetron (ZOFRAN) IV, polyvinyl alcohol, senna-docusate, traMADol     Results for orders placed or performed during the hospital encounter of 10/07/2016 (from the past 48 hour(s))  Lactic acid, plasma     Status: Abnormal   Collection Time: 10/05/2016  1:59 PM  Result Value Ref Range   Lactic Acid, Venous 2.4 (HH) 0.5 - 1.9 mmol/L    Comment: CRITICAL RESULT CALLED TO, READ BACK BY AND VERIFIED WITH: VOGLER,T. AT 9833 ON 09/27/2016 BY EVA   Influenza panel by PCR (type A & B)     Status: None   Collection Time: 09/22/2016  2:10 PM  Result Value Ref Range   Influenza A By PCR NEGATIVE NEGATIVE   Influenza B By PCR NEGATIVE NEGATIVE    Comment: (NOTE) The Xpert Xpress Flu assay is intended as an aid in the diagnosis of  influenza and should not be used as a sole basis for treatment.  This  assay is FDA approved for nasopharyngeal swab specimens only. Nasal  washings and aspirates are unacceptable for Xpert Xpress Flu testing.   Protime-INR     Status: Abnormal   Collection Time: 10/08/2016  6:59 PM  Result Value Ref Range   Prothrombin Time 19.3 (H) 11.4 - 15.2 seconds   INR 1.61   Glucose, capillary     Status: Abnormal   Collection Time: 10/13/16 12:31 AM  Result Value Ref Range   Glucose-Capillary <10 (LL) 65 - 99 mg/dL   Comment 1 Notify RN    Comment 2 Document  in Chart   Glucose, capillary     Status: Abnormal   Collection Time: 10/13/16 12:36 AM  Result Value Ref Range   Glucose-Capillary <10 (LL) 65 - 99 mg/dL  Glucose, capillary     Status: Abnormal   Collection Time: 10/13/16 12:41 AM  Result Value Ref Range   Glucose-Capillary 18 (LL) 65 - 99 mg/dL   Comment 1 Notify RN    Comment 2 Document in Chart   Glucose, capillary     Status: Abnormal   Collection Time: 10/13/16  1:03 AM  Result Value Ref Range   Glucose-Capillary 31 (LL) 65 - 99 mg/dL   Comment 1 Document in Chart   Glucose, capillary     Status: Abnormal   Collection Time: 10/13/16  1:16 AM  Result Value Ref Range   Glucose-Capillary 185 (H) 65 - 99 mg/dL  CBC with Differential/Platelet     Status: Abnormal   Collection Time: 10/13/16  1:16 AM  Result Value Ref Range   WBC 0.8 (LL) 4.0 - 10.5 K/uL    Comment: WHITE COUNT CONFIRMED ON SMEAR RESULT REPEATED AND VERIFIED CRITICAL RESULT CALLED TO, READ BACK BY AND VERIFIED WITH:  PHILLIPS,C @ 0239 ON 10/13/16 BY JUW    RBC 2.08 (L) 4.22 - 5.81 MIL/uL   Hemoglobin 5.8 (LL) 13.0 - 17.0 g/dL    Comment: RESULT REPEATED AND VERIFIED CRITICAL RESULT CALLED TO, READ BACK BY AND VERIFIED WITH:  PHILLIPS,C @ 0239 ON 10/13/16 BY JUW    HCT 16.9 (L) 39.0 - 52.0 %   MCV 81.3 78.0 - 100.0 fL   MCH 27.9 26.0 -  34.0 pg   MCHC 34.3 30.0 - 36.0 g/dL   RDW 15.5 11.5 - 15.5 %   Platelets 14 (LL) 150 - 400 K/uL    Comment: PLATELET COUNT CONFIRMED BY SMEAR RESULT REPEATED AND VERIFIED CRITICAL RESULT CALLED TO, READ BACK BY AND VERIFIED WITH:  PHILLIPS,C @ 0239 ON 10/13/16 BY JUW    Neutrophils Relative % 3 %   Neutro Abs 0.0 (L) 1.7 - 7.7 K/uL   Lymphocytes Relative 96 %   Lymphs Abs 0.8 0.7 - 4.0 K/uL   Monocytes Relative 1 %   Monocytes Absolute 0.0 (L) 0.1 - 1.0 K/uL   Eosinophils Relative 0 %   Eosinophils Absolute 0.0 0.0 - 0.7 K/uL   Basophils Relative 0 %   Basophils Absolute 0.0 0.0 - 0.1 K/uL  Comprehensive metabolic  panel     Status: Abnormal   Collection Time: 10/13/16  1:16 AM  Result Value Ref Range   Sodium 122 (L) 135 - 145 mmol/L   Potassium 6.4 (HH) 3.5 - 5.1 mmol/L    Comment: CRITICAL RESULT CALLED TO, READ BACK BY AND VERIFIED WITH:  DR.DAVID @ 0214 ON 10/13/16 BY JUW    Chloride 95 (L) 101 - 111 mmol/L   CO2 11 (L) 22 - 32 mmol/L   Glucose, Bld 1,168 (HH) 65 - 99 mg/dL    Comment: RESULT REPEATED AND VERIFIED RESULTS CONFIRMED BY MANUAL DILUTION CRITICAL RESULT CALLED TO, READ BACK BY AND VERIFIED WITH:  PHILLIPS,C @ 0239 ON 10/13/16 BY JUW    BUN 46 (H) 6 - 20 mg/dL   Creatinine, Ser 3.07 (H) 0.61 - 1.24 mg/dL   Calcium 7.5 (L) 8.9 - 10.3 mg/dL   Total Protein <3.0 (L) 6.5 - 8.1 g/dL   Albumin 1.2 (L) 3.5 - 5.0 g/dL   AST 915 (H) 15 - 41 U/L   ALT 687 (H) 17 - 63 U/L   Alkaline Phosphatase 26 (L) 38 - 126 U/L   Total Bilirubin 0.6 0.3 - 1.2 mg/dL   GFR calc non Af Amer 20 (L) >60 mL/min   GFR calc Af Amer 23 (L) >60 mL/min    Comment: (NOTE) The eGFR has been calculated using the CKD EPI equation. This calculation has not been validated in all clinical situations. eGFR's persistently <60 mL/min signify possible Chronic Kidney Disease.    Anion gap 16 (H) 5 - 15  Lactic acid, plasma     Status: Abnormal   Collection Time: 10/13/16  1:16 AM  Result Value Ref Range   Lactic Acid, Venous 11.6 (HH) 0.5 - 1.9 mmol/L    Comment: CRITICAL RESULT CALLED TO, READ BACK BY AND VERIFIED WITH:  PHILLIPS,C @ 0239 ON 10/13/16 BY JUW   Protime-INR     Status: Abnormal   Collection Time: 10/13/16  1:16 AM  Result Value Ref Range   Prothrombin Time 40.9 (H) 11.4 - 15.2 seconds   INR 4.09 (HH)     Comment: RESULT REPEATED AND VERIFIED CRITICAL RESULT CALLED TO, READ BACK BY AND VERIFIED WITH:  PHILLIPS,C @ 8270 ON 10/13/16 BY JUW   Magnesium     Status: None   Collection Time: 10/13/16  1:16 AM  Result Value Ref Range   Magnesium 1.8 1.7 - 2.4 mg/dL  Phosphorus     Status: Abnormal    Collection Time: 10/13/16  1:16 AM  Result Value Ref Range   Phosphorus 11.2 (H) 2.5 - 4.6 mg/dL  Uric acid     Status:  Abnormal   Collection Time: 10/13/16  1:16 AM  Result Value Ref Range   Uric Acid, Serum 4.3 (L) 4.4 - 7.6 mg/dL  Blood gas, arterial     Status: Abnormal   Collection Time: 10/13/16  1:35 AM  Result Value Ref Range   FIO2 1.00    O2 Content 100.0 L/min   Delivery systems VENTILATOR    Mode PRESSURE REGULATED VOLUME CONTROL    VT 510 mL   LHR 16.0 resp/min   Peep/cpap 5.0 cm H20   pH, Arterial 6.942 (LL) 7.350 - 7.450    Comment: CRITICAL RESULT CALLED TO, READ BACK BY AND VERIFIED WITH: TIM GOINS,RN BY BFRETWELL RRT ON 10/14/15 AT 0142    pCO2 arterial 41.3 32.0 - 48.0 mmHg   pO2, Arterial 58.0 (L) 83.0 - 108.0 mmHg   Bicarbonate 8.0 (L) 20.0 - 28.0 mmol/L   O2 Saturation 64.4 %   Collection site RIGHT RADIAL    Drawn by [phone]    Sample type ARTERIAL DRAW    Allens test (pass/fail) PASS PASS  Glucose, capillary     Status: Abnormal   Collection Time: 10/13/16  2:18 AM  Result Value Ref Range   Glucose-Capillary 452 (H) 65 - 99 mg/dL  Glucose, capillary     Status: Abnormal   Collection Time: 10/13/16  2:42 AM  Result Value Ref Range   Glucose-Capillary 364 (H) 65 - 99 mg/dL  Type and screen [facility]     Status: None   Collection Time: 10/13/16  3:17 AM  Result Value Ref Range   ISSUE DATE / TIME 222979892119    Blood Product Unit Number [id number]    PRODUCT CODE [id number]    Unit Type and Rh 6378    Blood Product Expiration Date [id number]    ISSUE DATE / TIME [id number]    Blood Product Unit Number [id number]    PRODUCT CODE [id number]    Unit Type and Rh 6200    Blood Product Expiration Date 568127517001   Prepare RBC     Status: None   Collection Time: 10/13/16  3:17 AM  Result Value Ref Range   Order Confirmation ORDER PROCESSED BY BLOOD BANK   Prepare Pheresed Platelets     Status: None   Collection Time: 10/13/16  3:17  AM  Result Value Ref Range   ISSUE DATE / TIME [id number]    Blood Product Unit Number [id number]    PRODUCT CODE [id number]    Unit Type and Rh 7300    Blood Product Expiration Date [id number]   Prepare fresh frozen plasma     Status: None   Collection Time: 10/13/16  3:17 AM  Result Value Ref Range   ISSUE DATE / TIME [id number]    Blood Product Unit Number [id number]    PRODUCT CODE [id number]    Unit Type and Rh 6200    Blood Product Expiration Date 726203559741   Glucose, capillary     Status: Abnormal   Collection Time: 10/13/16  3:17 AM  Result Value Ref Range   Glucose-Capillary 347 (H) 65 - 99 mg/dL  ABO/Rh     Status: None   Collection Time: 10/13/16  3:17 AM  Result Value Ref Range   ABO/RH(D) A POS   Blood gas, arterial     Status: Abnormal   Collection Time: 10/13/16  3:29 AM  Result Value Ref Range   FIO2 1.00    O2 Content 100.0 L/min   Delivery systems VENTILATOR  Mode PRESSURE REGULATED VOLUME CONTROL    VT 510 mL   LHR 18.0 resp/min   Peep/cpap 5.0 cm H20   pH, Arterial 7.022 (LL) 7.350 - 7.450    Comment: CRITICAL RESULT CALLED TO, READ BACK BY AND VERIFIED WITH: JAMES DANIEL,RN BY BFRETWELL RRT ON 10/13/16 AT 0338    pCO2 arterial 53.7 (H) 32.0 - 48.0 mmHg   pO2, Arterial 37.8 (LL) 83.0 - 108.0 mmHg    Comment: CRITICAL RESULT CALLED TO, READ BACK BY AND VERIFIED WITH: JAMES DANIEL,RN BY BFRETWELL RRT ON 10/13/16 AT 7209    Bicarbonate 11.4 (L) 20.0 - 28.0 mmol/L   Acid-Base Excess 15.7 (H) 0.0 - 2.0 mmol/L   Collection site RIGHT RADIAL    Drawn by [id number]    Sample type VEIN    Allens test (pass/fail) PASS PASS  Urinalysis, Routine w reflex microscopic     Status: Abnormal   Collection Time: 10/13/16  3:30 AM  Result Value Ref Range   Color, Urine YELLOW YELLOW   APPearance CLEAR CLEAR   Specific Gravity, Urine 1.015 1.005 - 1.030   pH 5.5 5.0 - 8.0   Glucose, UA NEGATIVE NEGATIVE mg/dL   Hgb urine dipstick LARGE (A) NEGATIVE    Bilirubin Urine NEGATIVE NEGATIVE   Ketones, ur NEGATIVE NEGATIVE mg/dL   Protein, ur TRACE (A) NEGATIVE mg/dL   Nitrite NEGATIVE NEGATIVE   Leukocytes, UA NEGATIVE NEGATIVE  Urinalysis, Microscopic (reflex)     Status: Abnormal   Collection Time: 10/13/16  3:30 AM  Result Value Ref Range   RBC / HPF TOO NUMEROUS TO COUNT 0 - 5 RBC/hpf   WBC, UA 0-5 0 - 5 WBC/hpf   Bacteria, UA MANY (A) NONE SEEN   Squamous Epithelial / LPF 0-5 (A) NONE SEEN  MRSA PCR Screening     Status: None   Collection Time: 10/13/16  3:31 AM  Result Value Ref Range   MRSA by PCR NEGATIVE NEGATIVE    Comment:        The GeneXpert MRSA Assay (FDA approved for NASAL specimens only), is one component of a comprehensive MRSA colonization surveillance program. It is not intended to diagnose MRSA infection nor to guide or monitor treatment for MRSA infections.   Comprehensive metabolic panel     Status: Abnormal   Collection Time: 10/13/16  3:41 AM  Result Value Ref Range   Sodium 133 (L) 135 - 145 mmol/L    Comment: DELTA CHECK NOTED   Potassium 5.5 (H) 3.5 - 5.1 mmol/L   Chloride 101 101 - 111 mmol/L   CO2 14 (L) 22 - 32 mmol/L   Glucose, Bld 558 (HH) 65 - 99 mg/dL    Comment: CRITICAL RESULT CALLED TO, READ BACK BY AND VERIFIED WITH: DANIELS,J AT 4:25AM ON 10/13/16 BY FESTERMAN,C    BUN 46 (H) 6 - 20 mg/dL   Creatinine, Ser 3.16 (H) 0.61 - 1.24 mg/dL   Calcium 6.5 (L) 8.9 - 10.3 mg/dL   Total Protein <3.0 (L) 6.5 - 8.1 g/dL   Albumin 1.2 (L) 3.5 - 5.0 g/dL   AST 2,200 (H) 15 - 41 U/L   ALT 1,029 (H) 17 - 63 U/L   Alkaline Phosphatase 25 (L) 38 - 126 U/L   Total Bilirubin 1.0 0.3 - 1.2 mg/dL   GFR calc non Af Amer 19 (L) >60 mL/min   GFR calc Af Amer 22 (L) >60 mL/min    Comment: (NOTE) The eGFR has been calculated using the  CKD EPI equation. This calculation has not been validated in all clinical situations. eGFR's persistently <60 mL/min signify possible Chronic Kidney Disease.    Anion gap  18 (H) 5 - 15  CBC     Status: Abnormal   Collection Time: 10/13/16  3:41 AM  Result Value Ref Range   WBC 0.5 (LL) 4.0 - 10.5 K/uL    Comment: RESULT REPEATED AND VERIFIED CRITICAL RESULT CALLED TO, READ BACK BY AND VERIFIED WITH:  PHILLIPS,C @ 0415 ON 10/13/16 BY JUW    RBC 2.21 (L) 4.22 - 5.81 MIL/uL   Hemoglobin 6.1 (LL) 13.0 - 17.0 g/dL    Comment: RESULT REPEATED AND VERIFIED CRITICAL RESULT CALLED TO, READ BACK BY AND VERIFIED WITH:  PHILLIPS,C @ 0415 ON 10/13/16 BY JUW    HCT 17.8 (L) 39.0 - 52.0 %   MCV 80.5 78.0 - 100.0 fL   MCH 27.6 26.0 - 34.0 pg   MCHC 34.3 30.0 - 36.0 g/dL   RDW 15.2 11.5 - 15.5 %   Platelets 18 (LL) 150 - 400 K/uL    Comment: RESULT REPEATED AND VERIFIED CRITICAL RESULT CALLED TO, READ BACK BY AND VERIFIED WITH:  PHILLIPS,C @ 0145 ON 10/13/16 BY JUW   Lactic acid, plasma     Status: Abnormal   Collection Time: 10/13/16  3:41 AM  Result Value Ref Range   Lactic Acid, Venous 12.6 (HH) 0.5 - 1.9 mmol/L    Comment: RESULTS CONFIRMED BY MANUAL DILUTION CRITICAL RESULT CALLED TO, READ BACK BY AND VERIFIED WITH: DANIELS,J AT 4:50AM ON 10/13/16 BY FESTERMAN,C   Blood gas, arterial     Status: Abnormal   Collection Time: 10/13/16  3:45 AM  Result Value Ref Range   FIO2 100.00    Delivery systems VENTILATOR    Mode PRESSURE REGULATED VOLUME CONTROL    VT 510 mL   LHR 18 resp/min   Peep/cpap 5.0 cm H20   pH, Arterial 7.204 (L) 7.350 - 7.450   pCO2 arterial 31.1 (L) 32.0 - 48.0 mmHg   pO2, Arterial 164.0 (H) 83.0 - 108.0 mmHg   Bicarbonate 12.9 (L) 20.0 - 28.0 mmol/L   Acid-base deficit 14.6 (H) 0.0 - 2.0 mmol/L   O2 Saturation 98.0 %   Patient temperature 37.0    Collection site RIGHT RADIAL    Drawn by [id number]    Sample type ARTERIAL    Allens test (pass/fail) PASS PASS  Glucose, capillary     Status: Abnormal   Collection Time: 10/13/16  5:10 AM  Result Value Ref Range   Glucose-Capillary 350 (H) 65 - 99 mg/dL  Glucose, capillary     Status:  Abnormal   Collection Time: 10/13/16  9:48 AM  Result Value Ref Range   Glucose-Capillary 134 (H) 65 - 99 mg/dL  Glucose, capillary     Status: Abnormal   Collection Time: 10/13/16 11:15 AM  Result Value Ref Range   Glucose-Capillary 119 (H) 65 - 99 mg/dL  Glucose, capillary     Status: None   Collection Time: 10/13/16  1:07 PM  Result Value Ref Range   Glucose-Capillary 70 65 - 99 mg/dL  Phosphorus     Status: Abnormal   Collection Time: 10/13/16  2:12 PM  Result Value Ref Range   Phosphorus 6.2 (H) 2.5 - 4.6 mg/dL  Fibrinogen     Status: None   Collection Time: 10/13/16  2:12 PM  Result Value Ref Range   Fibrinogen 401 210 - 475 mg/dL  Cortisol     Status: None   Collection Time: 10/13/16  2:12 PM  Result Value Ref Range   Cortisol, Plasma 88.5 ug/dL    Comment: RESULTS CONFIRMED BY MANUAL DILUTION (NOTE) AM    6.7 - 22.6 ug/dL PM   <10.0       ug/dL Performed at [facility] [address] [address], [place], [place] [address]   Hepatitis panel, acute     Status: None   Collection Time: 10/13/16  2:12 PM  Result Value Ref Range   Hepatitis B Surface Ag Negative Negative   HCV Ab <0.1 0.0 - 0.9 s/co ratio    Comment: (NOTE)                                  Negative:     < 0.8                             Indeterminate: 0.8 - 0.9                                  Positive:     > 0.9 The CDC recommends that a positive HCV antibody result be followed up with a HCV Nucleic Acid Amplification test (604540). Performed At: [facility] [place], [place] [id number] Lindon Romp MD [id number]    Hep A IgM Negative Negative   Hep B C IgM Negative Negative  CBC     Status: Abnormal   Collection Time: 10/13/16  2:12 PM  Result Value Ref Range   WBC 0.4 (LL) 4.0 - 10.5 K/uL    Comment: CRITICAL RESULT CALLED TO, READ BACK BY AND VERIFIED WITH: Eldo,C AT 1505 BY HFLYNT 10/13/16 REPEATED TO VERIFY    RBC 4.25 4.22 - 5.81 MIL/uL   Hemoglobin 12.2  (L) 13.0 - 17.0 g/dL    Comment: POST TRANSFUSION SPECIMEN   HCT 33.9 (L) 39.0 - 52.0 %   MCV 79.8 78.0 - 100.0 fL   MCH 28.7 26.0 - 34.0 pg   MCHC 36.0 30.0 - 36.0 g/dL   RDW 14.5 11.5 - 15.5 %   Platelets 43 (L) 150 - 400 K/uL    Comment: SPECIMEN CHECKED FOR CLOTS CONSISTENT WITH PREVIOUS RESULT   Basic metabolic panel     Status: Abnormal   Collection Time: 10/13/16  2:25 PM  Result Value Ref Range   Sodium 138 135 - 145 mmol/L   Potassium 3.8 3.5 - 5.1 mmol/L    Comment: DELTA CHECK NOTED   Chloride 101 101 - 111 mmol/L   CO2 17 (L) 22 - 32 mmol/L   Glucose, Bld 114 (H) 65 - 99 mg/dL   BUN 47 (H) 6 - 20 mg/dL   Creatinine, Ser 3.80 (H) 0.61 - 1.24 mg/dL   Calcium 6.7 (L) 8.9 - 10.3 mg/dL   GFR calc non Af Amer 15 (L) >60 mL/min   GFR calc Af Amer 18 (L) >60 mL/min    Comment: (NOTE) The eGFR has been calculated using the CKD EPI equation. This calculation has not been validated in all clinical situations. eGFR's persistently <60 mL/min signify possible Chronic Kidney Disease.    Anion gap 20 (H) 5 - 15  Lactic acid, plasma     Status: Abnormal   Collection  Time: 10/13/16  2:30 PM  Result Value Ref Range   Lactic Acid, Venous 11.4 (HH) 0.5 - 1.9 mmol/L    Comment: RESULTS CONFIRMED BY MANUAL DILUTION  Glucose, capillary     Status: Abnormal   Collection Time: 10/13/16  4:48 PM  Result Value Ref Range   Glucose-Capillary <10 (LL) 65 - 99 mg/dL  Basic metabolic panel     Status: Abnormal   Collection Time: 10/13/16  4:59 PM  Result Value Ref Range   Sodium 139 135 - 145 mmol/L   Potassium 4.4 3.5 - 5.1 mmol/L   Chloride 102 101 - 111 mmol/L   CO2 18 (L) 22 - 32 mmol/L   Glucose, Bld 91 65 - 99 mg/dL   BUN 47 (H) 6 - 20 mg/dL   Creatinine, Ser 3.78 (H) 0.61 - 1.24 mg/dL   Calcium 6.4 (LL) 8.9 - 10.3 mg/dL    Comment: CRITICAL RESULT CALLED TO, READ BACK BY AND VERIFIED WITH: Yaroslav,C ON 10/13/16 AT 1745 BY LOY,C    GFR calc non Af Amer 15 (L) >60 mL/min   GFR  calc Af Amer 18 (L) >60 mL/min    Comment: (NOTE) The eGFR has been calculated using the CKD EPI equation. This calculation has not been validated in all clinical situations. eGFR's persistently <60 mL/min signify possible Chronic Kidney Disease.    Anion gap 19 (H) 5 - 15  Glucose, capillary     Status: Abnormal   Collection Time: 10/13/16  6:14 PM  Result Value Ref Range   Glucose-Capillary 13 (LL) 65 - 99 mg/dL   Comment 1 Notify RN   Glucose, capillary     Status: Abnormal   Collection Time: 10/13/16  6:19 PM  Result Value Ref Range   Glucose-Capillary 12 (LL) 65 - 99 mg/dL   Comment 1 Notify RN   Glucose, random     Status: Abnormal   Collection Time: 10/13/16  7:03 PM  Result Value Ref Range   Glucose, Bld 202 (H) 65 - 99 mg/dL  Glucose, capillary     Status: Abnormal   Collection Time: 10/13/16  7:29 PM  Result Value Ref Range   Glucose-Capillary 23 (LL) 65 - 99 mg/dL   Comment 1 Notify RN   Protime-INR     Status: Abnormal   Collection Time: 10-23-2016  5:24 AM  Result Value Ref Range   Prothrombin Time 38.3 (H) 11.4 - 15.2 seconds   INR 3.79   Comprehensive metabolic panel     Status: Abnormal   Collection Time: 10-23-16  5:24 AM  Result Value Ref Range   Sodium 136 135 - 145 mmol/L   Potassium 4.7 3.5 - 5.1 mmol/L   Chloride 96 (L) 101 - 111 mmol/L   CO2 21 (L) 22 - 32 mmol/L   Glucose, Bld 108 (H) 65 - 99 mg/dL   BUN 54 (H) 6 - 20 mg/dL   Creatinine, Ser 4.75 (H) 0.61 - 1.24 mg/dL   Calcium 5.8 (LL) 8.9 - 10.3 mg/dL    Comment: CRITICAL RESULT CALLED TO, READ BACK BY AND VERIFIED WITH: WAGONER,R AT 6:30AM ON October 23, 2016 BY FESTERMAN,C    Total Protein 4.0 (L) 6.5 - 8.1 g/dL   Albumin 1.9 (L) 3.5 - 5.0 g/dL   AST 4,833 (H) 15 - 41 U/L    Comment: RESULTS CONFIRMED BY MANUAL DILUTION   ALT 2,141 (H) 17 - 63 U/L   Alkaline Phosphatase 73 38 - 126 U/L   Total Bilirubin  4.3 (H) 0.3 - 1.2 mg/dL   GFR calc non Af Amer 12 (L) >60 mL/min   GFR calc Af Amer 14 (L)  >60 mL/min    Comment: (NOTE) The eGFR has been calculated using the CKD EPI equation. This calculation has not been validated in all clinical situations. eGFR's persistently <60 mL/min signify possible Chronic Kidney Disease.    Anion gap 19 (H) 5 - 15    Studies/Results:   HEAD CT TECHNIQUE: Contiguous axial images were obtained from the base of the skull through the vertex without and with intravenous contrast  CONTRAST:  12m ISOVUE-300 IOPAMIDOL (ISOVUE-300) INJECTION 61%  COMPARISON:  CT HEAD June 25, 2014  FINDINGS: BRAIN: Mild ventriculomegaly on the basis of global parenchymal brain volume loss. No intraparenchymal hemorrhage, mass effect nor midline shift. Patchy supratentorial white matter hypodensities less than expected for patient's age, though non-specific are most compatible with chronic small vessel ischemic disease. Small area RIGHT frontoparietal and small area RIGHT mesial occipital encephalomalacia. No abnormal intracranial enhancement. No acute large vascular territory infarcts. No abnormal extra-axial fluid collections. Basal cisterns are patent.  VASCULAR: Moderate calcific atherosclerosis of the carotid siphons. Absent contrast opacification RIGHT carotid siphon.  SKULL: No skull fracture. No significant scalp soft tissue swelling.  SINUSES/ORBITS: The mastoid air-cells and included paranasal sinuses are well-aerated.The included ocular globes and orbital contents are non-suspicious.  OTHER: None.  IMPRESSION: 1. No acute intracranial process or CT findings of intracranial metastasis. 2. Mild global parenchymal brain volume loss. 3. 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 4. 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       Kofi A. DMerlene House M.D.  Diplomate, ATax adviserof Psychiatry and Neurology (  Neurology). 12018/02/15 8:24 AM

## 2016-10-16 NOTE — Progress Notes (Signed)
Subjective: He remains extremely critically ill. He has continued hypotension and he is on 3 pressors still. Yesterday his movement looked more like posturing. His pupils were dilated and not responsive but today his right pupil seems to respond less with the left. He does not seem to be moving his left side he is moving his right and it does not appear quite as much like posturing. His family reports he is opening his eyes to voice and he is squeezing their hand. I could not elicit either of those responses.  Objective: Vital signs in last 24 hours: Temp:  [92.5 F (33.6 C)-100.9 F (38.3 C)] 100.9 F (38.3 C) (01/30 0730) Pulse Rate:  [93-107] 101 (01/30 0730) Resp:  [25-38] 29 (01/30 0730) BP: (65-142)/(54-128) 114/77 (01/29 1915) SpO2:  [94 %-100 %] 99 % (01/30 0730) FiO2 (%):  [40 %-50 %] 40 % (01/29 2335) Weight:  [92.5 kg (203 lb 14.8 oz)] 92.5 kg (203 lb 14.8 oz) (01/30 0500) Weight change: 15.4 kg (33 lb 14.8 oz) Last BM Date: 09/20/2016  Intake/Output from previous day: 01/29 0701 - 01/30 0700 In: 2406.1 [I.V.:1518.1; Blood:838; IV Piggyback:50] Out: 620 [Urine:875]  PHYSICAL EXAM General appearance: Intubated sedated nonresponsive to me on mechanical ventilation Resp: rales bilaterally and rhonchi bilaterally Cardio: regular rate and rhythm, S1, S2 normal, no murmur, click, rub or gallop GI: soft, non-tender; bowel sounds normal; no masses,  no organomegaly Extremities: No edema He has significant bruising of the skin presumably related to his thrombocytopenia  Lab Results:  Results for orders placed or performed during the hospital encounter of 10/03/2016 (from the past 48 hour(s))  Lactic acid, plasma     Status: Abnormal   Collection Time: 10/09/2016  1:59 PM  Result Value Ref Range   Lactic Acid, Venous 2.4 (HH) 0.5 - 1.9 mmol/L    Comment: CRITICAL RESULT CALLED TO, READ BACK BY AND VERIFIED WITH: VOGLER,T. AT 3559 ON 09/30/2016 BY EVA   Influenza panel by PCR (type  A & B)     Status: None   Collection Time: 10/04/2016  2:10 PM  Result Value Ref Range   Influenza A By PCR NEGATIVE NEGATIVE   Influenza B By PCR NEGATIVE NEGATIVE    Comment: (NOTE) The Xpert Xpress Flu assay is intended as an aid in the diagnosis of  influenza and should not be used as a sole basis for treatment.  This  assay is FDA approved for nasopharyngeal swab specimens only. Nasal  washings and aspirates are unacceptable for Xpert Xpress Flu testing.   Protime-INR     Status: Abnormal   Collection Time: 10/10/2016  6:59 PM  Result Value Ref Range   Prothrombin Time 19.3 (H) 11.4 - 15.2 seconds   INR 1.61   Glucose, capillary     Status: Abnormal   Collection Time: 10/13/16 12:31 AM  Result Value Ref Range   Glucose-Capillary <10 (LL) 65 - 99 mg/dL   Comment 1 Notify RN    Comment 2 Document in Chart   Glucose, capillary     Status: Abnormal   Collection Time: 10/13/16 12:36 AM  Result Value Ref Range   Glucose-Capillary <10 (LL) 65 - 99 mg/dL  Glucose, capillary     Status: Abnormal   Collection Time: 10/13/16 12:41 AM  Result Value Ref Range   Glucose-Capillary 18 (LL) 65 - 99 mg/dL   Comment 1 Notify RN    Comment 2 Document in Chart   Glucose, capillary  Status: Abnormal   Collection Time: 10/13/16  1:03 AM  Result Value Ref Range   Glucose-Capillary 31 (LL) 65 - 99 mg/dL   Comment 1 Document in Chart   Glucose, capillary     Status: Abnormal   Collection Time: 10/13/16  1:16 AM  Result Value Ref Range   Glucose-Capillary 185 (H) 65 - 99 mg/dL  CBC with Differential/Platelet     Status: Abnormal   Collection Time: 10/13/16  1:16 AM  Result Value Ref Range   WBC 0.8 (LL) 4.0 - 10.5 K/uL    Comment: WHITE COUNT CONFIRMED ON SMEAR RESULT REPEATED AND VERIFIED CRITICAL RESULT CALLED TO, READ BACK BY AND VERIFIED WITH:  PHILLIPS,C @ 0239 ON 10/13/16 BY JUW    RBC 2.08 (L) 4.22 - 5.81 MIL/uL   Hemoglobin 5.8 (LL) 13.0 - 17.0 g/dL    Comment: RESULT REPEATED AND  VERIFIED CRITICAL RESULT CALLED TO, READ BACK BY AND VERIFIED WITH:  PHILLIPS,C @ 0239 ON 10/13/16 BY JUW    HCT 16.9 (L) 39.0 - 52.0 %   MCV 81.3 78.0 - 100.0 fL   MCH 27.9 26.0 - 34.0 pg   MCHC 34.3 30.0 - 36.0 g/dL   RDW 15.5 11.5 - 15.5 %   Platelets 14 (LL) 150 - 400 K/uL    Comment: PLATELET COUNT CONFIRMED BY SMEAR RESULT REPEATED AND VERIFIED CRITICAL RESULT CALLED TO, READ BACK BY AND VERIFIED WITH:  PHILLIPS,C @ 0239 ON 10/13/16 BY JUW    Neutrophils Relative % 3 %   Neutro Abs 0.0 (L) 1.7 - 7.7 K/uL   Lymphocytes Relative 96 %   Lymphs Abs 0.8 0.7 - 4.0 K/uL   Monocytes Relative 1 %   Monocytes Absolute 0.0 (L) 0.1 - 1.0 K/uL   Eosinophils Relative 0 %   Eosinophils Absolute 0.0 0.0 - 0.7 K/uL   Basophils Relative 0 %   Basophils Absolute 0.0 0.0 - 0.1 K/uL  Comprehensive metabolic panel     Status: Abnormal   Collection Time: 10/13/16  1:16 AM  Result Value Ref Range   Sodium 122 (L) 135 - 145 mmol/L   Potassium 6.4 (HH) 3.5 - 5.1 mmol/L    Comment: CRITICAL RESULT CALLED TO, READ BACK BY AND VERIFIED WITH:  DR.DAVID @ 0214 ON 10/13/16 BY JUW    Chloride 95 (L) 101 - 111 mmol/L   CO2 11 (L) 22 - 32 mmol/L   Glucose, Bld 1,168 (HH) 65 - 99 mg/dL    Comment: RESULT REPEATED AND VERIFIED RESULTS CONFIRMED BY MANUAL DILUTION CRITICAL RESULT CALLED TO, READ BACK BY AND VERIFIED WITH:  PHILLIPS,C @ 0239 ON 10/13/16 BY JUW    BUN 46 (H) 6 - 20 mg/dL   Creatinine, Ser 3.07 (H) 0.61 - 1.24 mg/dL   Calcium 7.5 (L) 8.9 - 10.3 mg/dL   Total Protein <3.0 (L) 6.5 - 8.1 g/dL   Albumin 1.2 (L) 3.5 - 5.0 g/dL   AST 915 (H) 15 - 41 U/L   ALT 687 (H) 17 - 63 U/L   Alkaline Phosphatase 26 (L) 38 - 126 U/L   Total Bilirubin 0.6 0.3 - 1.2 mg/dL   GFR calc non Af Amer 20 (L) >60 mL/min   GFR calc Af Amer 23 (L) >60 mL/min    Comment: (NOTE) The eGFR has been calculated using the CKD EPI equation. This calculation has not been validated in all clinical situations. eGFR's  persistently <60 mL/min signify possible Chronic Kidney Disease.  Anion gap 16 (H) 5 - 15  Lactic acid, plasma     Status: Abnormal   Collection Time: 10/13/16  1:16 AM  Result Value Ref Range   Lactic Acid, Venous 11.6 (HH) 0.5 - 1.9 mmol/L    Comment: CRITICAL RESULT CALLED TO, READ BACK BY AND VERIFIED WITH:  PHILLIPS,C @ 0239 ON 10/13/16 BY JUW   Protime-INR     Status: Abnormal   Collection Time: 10/13/16  1:16 AM  Result Value Ref Range   Prothrombin Time 40.9 (H) 11.4 - 15.2 seconds   INR 4.09 (HH)     Comment: RESULT REPEATED AND VERIFIED CRITICAL RESULT CALLED TO, READ BACK BY AND VERIFIED WITH:  PHILLIPS,C @ 0212 ON 10/13/16 BY JUW   Magnesium     Status: None   Collection Time: 10/13/16  1:16 AM  Result Value Ref Range   Magnesium 1.8 1.7 - 2.4 mg/dL  Phosphorus     Status: Abnormal   Collection Time: 10/13/16  1:16 AM  Result Value Ref Range   Phosphorus 11.2 (H) 2.5 - 4.6 mg/dL  Uric acid     Status: Abnormal   Collection Time: 10/13/16  1:16 AM  Result Value Ref Range   Uric Acid, Serum 4.3 (L) 4.4 - 7.6 mg/dL  Blood gas, arterial     Status: Abnormal   Collection Time: 10/13/16  1:35 AM  Result Value Ref Range   FIO2 1.00    O2 Content 100.0 L/min   Delivery systems VENTILATOR    Mode PRESSURE REGULATED VOLUME CONTROL    VT 510 mL   LHR 16.0 resp/min   Peep/cpap 5.0 cm H20   pH, Arterial 6.942 (LL) 7.350 - 7.450    Comment: CRITICAL RESULT CALLED TO, READ BACK BY AND VERIFIED WITH: TIM GOINS,RN BY BFRETWELL RRT ON 10/14/15 AT 0142    pCO2 arterial 41.3 32.0 - 48.0 mmHg   pO2, Arterial 58.0 (L) 83.0 - 108.0 mmHg   Bicarbonate 8.0 (L) 20.0 - 28.0 mmol/L   O2 Saturation 64.4 %   Collection site RIGHT RADIAL    Drawn by [phone]    Sample type ARTERIAL DRAW    Allens test (pass/fail) PASS PASS  Glucose, capillary     Status: Abnormal   Collection Time: 10/13/16  2:18 AM  Result Value Ref Range   Glucose-Capillary 452 (H) 65 - 99 mg/dL  Glucose,  capillary     Status: Abnormal   Collection Time: 10/13/16  2:42 AM  Result Value Ref Range   Glucose-Capillary 364 (H) 65 - 99 mg/dL  Type and screen [facility]     Status: None   Collection Time: 10/13/16  3:17 AM  Result Value Ref Range   ISSUE DATE / TIME 017510258527    Blood Product Unit Number [id number]    PRODUCT CODE [id number]    Unit Type and Rh 6195    Blood Product Expiration Date 093267124580    ISSUE DATE / TIME 998338250539    Blood Product Unit Number [id number]    PRODUCT CODE [id number]    Unit Type and Rh 6200    Blood Product Expiration Date [id number]   Prepare RBC     Status: None   Collection Time: 10/13/16  3:17 AM  Result Value Ref Range   Order Confirmation ORDER PROCESSED BY BLOOD BANK   Prepare Pheresed Platelets     Status: None   Collection Time: 10/13/16  3:17 AM  Result Value Ref Range  ISSUE DATE / TIME [id number]    Blood Product Unit Number [id number]    PRODUCT CODE [id number]    Unit Type and Rh 7300    Blood Product Expiration Date [id number]   Prepare fresh frozen plasma     Status: None   Collection Time: 10/13/16  3:17 AM  Result Value Ref Range   ISSUE DATE / TIME [id number]    Blood Product Unit Number [id number]    PRODUCT CODE [id number]    Unit Type and Rh 6200    Blood Product Expiration Date [id number]   Glucose, capillary     Status: Abnormal   Collection Time: 10/13/16  3:17 AM  Result Value Ref Range   Glucose-Capillary 347 (H) 65 - 99 mg/dL  ABO/Rh     Status: None   Collection Time: 10/13/16  3:17 AM  Result Value Ref Range   ABO/RH(D) A POS   Blood gas, arterial     Status: Abnormal   Collection Time: 10/13/16  3:29 AM  Result Value Ref Range   FIO2 1.00    O2 Content 100.0 L/min   Delivery systems VENTILATOR    Mode PRESSURE REGULATED VOLUME CONTROL    VT 510 mL   LHR 18.0 resp/min   Peep/cpap 5.0 cm H20   pH, Arterial 7.022 (LL) 7.350 - 7.450    Comment: CRITICAL RESULT CALLED TO,  READ BACK BY AND VERIFIED WITH: JAMES DANIEL,RN BY BFRETWELL RRT ON 10/13/16 AT 0338    pCO2 arterial 53.7 (H) 32.0 - 48.0 mmHg   pO2, Arterial 37.8 (LL) 83.0 - 108.0 mmHg    Comment: CRITICAL RESULT CALLED TO, READ BACK BY AND VERIFIED WITH: JAMES DANIEL,RN BY BFRETWELL RRT ON 10/13/16 AT 0940    Bicarbonate 11.4 (L) 20.0 - 28.0 mmol/L   Acid-Base Excess 15.7 (H) 0.0 - 2.0 mmol/L   Collection site RIGHT RADIAL    Drawn by [id number]    Sample type VEIN    Allens test (pass/fail) PASS PASS  Urinalysis, Routine w reflex microscopic     Status: Abnormal   Collection Time: 10/13/16  3:30 AM  Result Value Ref Range   Color, Urine YELLOW YELLOW   APPearance CLEAR CLEAR   Specific Gravity, Urine 1.015 1.005 - 1.030   pH 5.5 5.0 - 8.0   Glucose, UA NEGATIVE NEGATIVE mg/dL   Hgb urine dipstick LARGE (A) NEGATIVE   Bilirubin Urine NEGATIVE NEGATIVE   Ketones, ur NEGATIVE NEGATIVE mg/dL   Protein, ur TRACE (A) NEGATIVE mg/dL   Nitrite NEGATIVE NEGATIVE   Leukocytes, UA NEGATIVE NEGATIVE  Urinalysis, Microscopic (reflex)     Status: Abnormal   Collection Time: 10/13/16  3:30 AM  Result Value Ref Range   RBC / HPF TOO NUMEROUS TO COUNT 0 - 5 RBC/hpf   WBC, UA 0-5 0 - 5 WBC/hpf   Bacteria, UA MANY (A) NONE SEEN   Squamous Epithelial / LPF 0-5 (A) NONE SEEN  MRSA PCR Screening     Status: None   Collection Time: 10/13/16  3:31 AM  Result Value Ref Range   MRSA by PCR NEGATIVE NEGATIVE    Comment:        The GeneXpert MRSA Assay (FDA approved for NASAL specimens only), is one component of a comprehensive MRSA colonization surveillance program. It is not intended to diagnose MRSA infection nor to guide or monitor treatment for MRSA infections.   Comprehensive metabolic panel     Status: Abnormal   Collection Time: 10/13/16  3:41 AM  Result Value Ref Range   Sodium 133 (L) 135 - 145 mmol/L    Comment: DELTA CHECK NOTED   Potassium 5.5 (H) 3.5 - 5.1 mmol/L   Chloride 101 101 - 111  mmol/L   CO2 14 (L) 22 - 32 mmol/L   Glucose, Bld 558 (HH) 65 - 99 mg/dL    Comment: CRITICAL RESULT CALLED TO, READ BACK BY AND VERIFIED WITH: DANIELS,J AT 4:25AM ON 10/13/16 BY FESTERMAN,C    BUN 46 (H) 6 - 20 mg/dL   Creatinine, Ser 3.16 (H) 0.61 - 1.24 mg/dL   Calcium 6.5 (L) 8.9 - 10.3 mg/dL   Total Protein <3.0 (L) 6.5 - 8.1 g/dL   Albumin 1.2 (L) 3.5 - 5.0 g/dL   AST 2,200 (H) 15 - 41 U/L   ALT 1,029 (H) 17 - 63 U/L   Alkaline Phosphatase 25 (L) 38 - 126 U/L   Total Bilirubin 1.0 0.3 - 1.2 mg/dL   GFR calc non Af Amer 19 (L) >60 mL/min   GFR calc Af Amer 22 (L) >60 mL/min    Comment: (NOTE) The eGFR has been calculated using the CKD EPI equation. This calculation has not been validated in all clinical situations. eGFR's persistently <60 mL/min signify possible Chronic Kidney Disease.    Anion gap 18 (H) 5 - 15  CBC     Status: Abnormal   Collection Time: 10/13/16  3:41 AM  Result Value Ref Range   WBC 0.5 (LL) 4.0 - 10.5 K/uL    Comment: RESULT REPEATED AND VERIFIED CRITICAL RESULT CALLED TO, READ BACK BY AND VERIFIED WITH:  PHILLIPS,C @ 0415 ON 10/13/16 BY JUW    RBC 2.21 (L) 4.22 - 5.81 MIL/uL   Hemoglobin 6.1 (LL) 13.0 - 17.0 g/dL    Comment: RESULT REPEATED AND VERIFIED CRITICAL RESULT CALLED TO, READ BACK BY AND VERIFIED WITH:  PHILLIPS,C @ 0415 ON 10/13/16 BY JUW    HCT 17.8 (L) 39.0 - 52.0 %   MCV 80.5 78.0 - 100.0 fL   MCH 27.6 26.0 - 34.0 pg   MCHC 34.3 30.0 - 36.0 g/dL   RDW 15.2 11.5 - 15.5 %   Platelets 18 (LL) 150 - 400 K/uL    Comment: RESULT REPEATED AND VERIFIED CRITICAL RESULT CALLED TO, READ BACK BY AND VERIFIED WITH:  PHILLIPS,C @ 0145 ON 10/13/16 BY JUW   Lactic acid, plasma     Status: Abnormal   Collection Time: 10/13/16  3:41 AM  Result Value Ref Range   Lactic Acid, Venous 12.6 (HH) 0.5 - 1.9 mmol/L    Comment: RESULTS CONFIRMED BY MANUAL DILUTION CRITICAL RESULT CALLED TO, READ BACK BY AND VERIFIED WITH: DANIELS,J AT 4:50AM ON  10/13/16 BY FESTERMAN,C   Blood gas, arterial     Status: Abnormal   Collection Time: 10/13/16  3:45 AM  Result Value Ref Range   FIO2 100.00    Delivery systems VENTILATOR    Mode PRESSURE REGULATED VOLUME CONTROL    VT 510 mL   LHR 18 resp/min   Peep/cpap 5.0 cm H20   pH, Arterial 7.204 (L) 7.350 - 7.450   pCO2 arterial 31.1 (L) 32.0 - 48.0 mmHg   pO2, Arterial 164.0 (H) 83.0 - 108.0 mmHg   Bicarbonate 12.9 (L) 20.0 - 28.0 mmol/L   Acid-base deficit 14.6 (H) 0.0 - 2.0 mmol/L   O2 Saturation 98.0 %   Patient temperature 37.0    Collection site RIGHT RADIAL    Drawn  by [id number]    Sample type ARTERIAL    Allens test (pass/fail) PASS PASS  Glucose, capillary     Status: Abnormal   Collection Time: 10/13/16  5:10 AM  Result Value Ref Range   Glucose-Capillary 350 (H) 65 - 99 mg/dL  Glucose, capillary     Status: Abnormal   Collection Time: 10/13/16  9:48 AM  Result Value Ref Range   Glucose-Capillary 134 (H) 65 - 99 mg/dL  Glucose, capillary     Status: Abnormal   Collection Time: 10/13/16 11:15 AM  Result Value Ref Range   Glucose-Capillary 119 (H) 65 - 99 mg/dL  Glucose, capillary     Status: None   Collection Time: 10/13/16  1:07 PM  Result Value Ref Range   Glucose-Capillary 70 65 - 99 mg/dL  Phosphorus     Status: Abnormal   Collection Time: 10/13/16  2:12 PM  Result Value Ref Range   Phosphorus 6.2 (H) 2.5 - 4.6 mg/dL  Fibrinogen     Status: None   Collection Time: 10/13/16  2:12 PM  Result Value Ref Range   Fibrinogen 401 210 - 475 mg/dL  Cortisol     Status: None   Collection Time: 10/13/16  2:12 PM  Result Value Ref Range   Cortisol, Plasma 88.5 ug/dL    Comment: RESULTS CONFIRMED BY MANUAL DILUTION (NOTE) AM    6.7 - 22.6 ug/dL PM   <10.0       ug/dL Performed at [facility], [address] [place], [place] [address]   Hepatitis panel, acute     Status: None   Collection Time: 10/13/16  2:12 PM  Result Value Ref Range   Hepatitis B Surface Ag  Negative Negative   HCV Ab <0.1 0.0 - 0.9 s/co ratio    Comment: (NOTE)                                  Negative:     < 0.8                             Indeterminate: 0.8 - 0.9                                  Positive:     > 0.9 The CDC recommends that a positive HCV antibody result be followed up with a HCV Nucleic Acid Amplification test (517616). Performed At: [facility] [place], [place] [id number] Lindon Romp MD RS:[id number]    Hep A IgM Negative Negative   Hep B C IgM Negative Negative  CBC     Status: Abnormal   Collection Time: 10/13/16  2:12 PM  Result Value Ref Range   WBC 0.4 (LL) 4.0 - 10.5 K/uL    Comment: CRITICAL RESULT CALLED TO, READ BACK BY AND VERIFIED WITH: Rorey,C AT 1505 BY HFLYNT 10/13/16 REPEATED TO VERIFY    RBC 4.25 4.22 - 5.81 MIL/uL   Hemoglobin 12.2 (L) 13.0 - 17.0 g/dL    Comment: POST TRANSFUSION SPECIMEN   HCT 33.9 (L) 39.0 - 52.0 %   MCV 79.8 78.0 - 100.0 fL   MCH 28.7 26.0 - 34.0 pg   MCHC 36.0 30.0 - 36.0 g/dL   RDW 14.5 11.5 - 15.5 %   Platelets  43 (L) 150 - 400 K/uL    Comment: SPECIMEN CHECKED FOR CLOTS CONSISTENT WITH PREVIOUS RESULT   Basic metabolic panel     Status: Abnormal   Collection Time: 10/13/16  2:25 PM  Result Value Ref Range   Sodium 138 135 - 145 mmol/L   Potassium 3.8 3.5 - 5.1 mmol/L    Comment: DELTA CHECK NOTED   Chloride 101 101 - 111 mmol/L   CO2 17 (L) 22 - 32 mmol/L   Glucose, Bld 114 (H) 65 - 99 mg/dL   BUN 47 (H) 6 - 20 mg/dL   Creatinine, Ser 3.80 (H) 0.61 - 1.24 mg/dL   Calcium 6.7 (L) 8.9 - 10.3 mg/dL   GFR calc non Af Amer 15 (L) >60 mL/min   GFR calc Af Amer 18 (L) >60 mL/min    Comment: (NOTE) The eGFR has been calculated using the CKD EPI equation. This calculation has not been validated in all clinical situations. eGFR's persistently <60 mL/min signify possible Chronic Kidney Disease.    Anion gap 20 (H) 5 - 15  Lactic acid, plasma     Status: Abnormal    Collection Time: 10/13/16  2:30 PM  Result Value Ref Range   Lactic Acid, Venous 11.4 (HH) 0.5 - 1.9 mmol/L    Comment: RESULTS CONFIRMED BY MANUAL DILUTION  Glucose, capillary     Status: Abnormal   Collection Time: 10/13/16  4:48 PM  Result Value Ref Range   Glucose-Capillary <10 (LL) 65 - 99 mg/dL  Basic metabolic panel     Status: Abnormal   Collection Time: 10/13/16  4:59 PM  Result Value Ref Range   Sodium 139 135 - 145 mmol/L   Potassium 4.4 3.5 - 5.1 mmol/L   Chloride 102 101 - 111 mmol/L   CO2 18 (L) 22 - 32 mmol/L   Glucose, Bld 91 65 - 99 mg/dL   BUN 47 (H) 6 - 20 mg/dL   Creatinine, Ser 3.78 (H) 0.61 - 1.24 mg/dL   Calcium 6.4 (LL) 8.9 - 10.3 mg/dL    Comment: CRITICAL RESULT CALLED TO, READ BACK BY AND VERIFIED WITH: Aiden,C ON 10/13/16 AT 1745 BY LOY,C    GFR calc non Af Amer 15 (L) >60 mL/min   GFR calc Af Amer 18 (L) >60 mL/min    Comment: (NOTE) The eGFR has been calculated using the CKD EPI equation. This calculation has not been validated in all clinical situations. eGFR's persistently <60 mL/min signify possible Chronic Kidney Disease.    Anion gap 19 (H) 5 - 15  Glucose, capillary     Status: Abnormal   Collection Time: 10/13/16  6:14 PM  Result Value Ref Range   Glucose-Capillary 13 (LL) 65 - 99 mg/dL   Comment 1 Notify RN   Glucose, capillary     Status: Abnormal   Collection Time: 10/13/16  6:19 PM  Result Value Ref Range   Glucose-Capillary 12 (LL) 65 - 99 mg/dL   Comment 1 Notify RN   Glucose, random     Status: Abnormal   Collection Time: 10/13/16  7:03 PM  Result Value Ref Range   Glucose, Bld 202 (H) 65 - 99 mg/dL  Glucose, capillary     Status: Abnormal   Collection Time: 10/13/16  7:29 PM  Result Value Ref Range   Glucose-Capillary 23 (LL) 65 - 99 mg/dL   Comment 1 Notify RN   Protime-INR     Status: Abnormal   Collection Time: 2016/11/11  5:24 AM  Result Value Ref Range   Prothrombin Time 38.3 (H) 11.4 - 15.2 seconds   INR 3.79    Comprehensive metabolic panel     Status: Abnormal   Collection Time: 10-23-16  5:24 AM  Result Value Ref Range   Sodium 136 135 - 145 mmol/L   Potassium 4.7 3.5 - 5.1 mmol/L   Chloride 96 (L) 101 - 111 mmol/L   CO2 21 (L) 22 - 32 mmol/L   Glucose, Bld 108 (H) 65 - 99 mg/dL   BUN 54 (H) 6 - 20 mg/dL   Creatinine, Ser 4.75 (H) 0.61 - 1.24 mg/dL   Calcium 5.8 (LL) 8.9 - 10.3 mg/dL    Comment: CRITICAL RESULT CALLED TO, READ BACK BY AND VERIFIED WITH: WAGONER,R AT 6:30AM ON 2016/10/23 BY FESTERMAN,C    Total Protein 4.0 (L) 6.5 - 8.1 g/dL   Albumin 1.9 (L) 3.5 - 5.0 g/dL   AST 4,833 (H) 15 - 41 U/L    Comment: RESULTS CONFIRMED BY MANUAL DILUTION   ALT 2,141 (H) 17 - 63 U/L   Alkaline Phosphatase 73 38 - 126 U/L   Total Bilirubin 4.3 (H) 0.3 - 1.2 mg/dL   GFR calc non Af Amer 12 (L) >60 mL/min   GFR calc Af Amer 14 (L) >60 mL/min    Comment: (NOTE) The eGFR has been calculated using the CKD EPI equation. This calculation has not been validated in all clinical situations. eGFR's persistently <60 mL/min signify possible Chronic Kidney Disease.    Anion gap 19 (H) 5 - 15    ABGS  Recent Labs  10/13/16 0345  PHART 7.204*  PO2ART 164.0*  HCO3 12.9*   CULTURES Recent Results (from the past 240 hour(s))  MRSA PCR Screening     Status: None   Collection Time: 10/13/16  3:31 AM  Result Value Ref Range Status   MRSA by PCR NEGATIVE NEGATIVE Final    Comment:        The GeneXpert MRSA Assay (FDA approved for NASAL specimens only), is one component of a comprehensive MRSA colonization surveillance program. It is not intended to diagnose MRSA infection nor to guide or monitor treatment for MRSA infections.    Studies/Results: Dg Chest Port 1 View  Result Date: 10/13/2016 CLINICAL DATA:  66 year old male status post CPR. History of sepsis. EXAM: PORTABLE CHEST 1 VIEW COMPARISON:  Chest x-ray 10/13/2016. FINDINGS: An endotracheal tube is in place with tip 3.8 cm above the  carina. There is a right upper extremity PICC with tip terminating in the mid superior vena cava. Left-sided pacemaker/AICD with lead tips projecting over the expected location of the right atrium and right ventricular apex. There is cephalization of the pulmonary vasculature, indistinctness of the interstitial markings, and patchy airspace disease throughout the lungs bilaterally suggestive of moderate pulmonary edema. Low lung volumes. Mild cardiomegaly. Upper mediastinal contours are within normal limits. No definite pneumothorax. Orthopedic fixation hardware in the lower cervical spine. Electronic device projecting over the lower right hemithorax. IMPRESSION: 1. Support apparatus, as above. 2. The appearance the chest suggests mild to moderate congestive heart failure. Electronically Signed   By: Vinnie Langton M.D.   On: 10/13/2016 10:19   Dg Chest Port 1 View  Result Date: 10/13/2016 CLINICAL DATA:  Endotracheal tube placement. EXAM: PORTABLE CHEST 1 VIEW COMPARISON:  Chest from acute abdomen series yesterday. FINDINGS: Endotracheal tube 3.9 cm from the carina. Tip of the right central line in mid SVC. Dual lead left-sided  pacemaker remains in place. The heart is enlarged. Bilateral lung base interstitial opacities again seen. Increasing opacities throughout the right lung and left infrahilar region. Left lower lobe pulmonary nodule on prior PET-CT not visualized radiographically. No definite pleural fluid. No pneumothorax. IMPRESSION: 1. Endotracheal tube 3.9 cm from the carina. 2. Stable cardiomegaly. Increasing opacities throughout the right lung and left infrahilar region, favor vascular congestion over aspiration or pneumonia. Electronically Signed   By: Jeb Levering M.D.   On: 10/13/2016 02:32    Medications:  Prior to Admission:  Prescriptions Prior to Admission  Medication Sig Dispense Refill Last Dose  . albuterol (PROVENTIL HFA;VENTOLIN HFA) 108 (90 Base) MCG/ACT inhaler Inhale 2  puffs into the lungs every 6 (six) hours as needed for wheezing or shortness of breath. 1 Inhaler 1 10/11/2016 at Unknown time  . allopurinol (ZYLOPRIM) 300 MG tablet Take 300 mg by mouth daily.  4 Past Week at Unknown time  . ALPRAZolam (XANAX) 0.5 MG tablet Take 0.5 mg by mouth 2 (two) times daily as needed for anxiety.   10/10/2016 at Unknown time  . colchicine 0.6 MG tablet Take 0.6 mg by mouth daily.   10/11/2016 at Unknown time  . ETOPOSIDE IV Inject into the vein. Days 1-3 every 21 days   10/08/2016 at Unknown time  . furosemide (LASIX) 20 MG tablet Take 1 tablet (20 mg total) by mouth daily. 90 tablet 3 10/11/2016 at Unknown time  . ipratropium (ATROVENT) 0.02 % nebulizer solution Take 0.5 mg by nebulization every 6 (six) hours as needed for wheezing or shortness of breath.   Past Month at Unknown time  . isosorbide mononitrate (IMDUR) 30 MG 24 hr tablet Take 1 tablet (30 mg total) by mouth daily. 90 tablet 3 10/11/2016 at Unknown time  . omeprazole (PRILOSEC) 20 MG capsule Take 20 mg by mouth daily.  2 10/11/2016 at Unknown time  . simvastatin (ZOCOR) 10 MG tablet Take 1 tablet (10 mg total) by mouth at bedtime. 90 tablet 3 10/11/2016 at Unknown time  . sotalol (BETAPACE) 120 MG tablet Take 120 mg by mouth 2 (two) times daily.  4 10/11/2016 at Unknown time  . ULORIC 40 MG tablet Take 1 tablet by mouth daily.  3 10/11/2016 at Unknown time  . NITROSTAT 0.4 MG SL tablet PLACE 1 TABLET UNDER TONGUE EVERY 5 MINUTES AS NEEDED FOR CHEST PAIN (UP TO 3 DOSES) 25 tablet 3 unknown  . ondansetron (ZOFRAN) 8 MG tablet Take 1 tablet (8 mg total) by mouth every 8 (eight) hours as needed for nausea or vomiting. 30 tablet 2 10/01/2016  . Pegfilgrastim (NEULASTA ONPRO [place]) Inject into the skin. Every 21 days - to be administered 27 hours post chemo completion   10/08/2016  . potassium chloride (K-DUR) 10 MEQ tablet Take 10 mEq by mouth 2 (two) times daily.   unknown  . prochlorperazine (COMPAZINE) 10 MG tablet Take 1  tablet (10 mg total) by mouth every 6 (six) hours as needed for nausea or vomiting. 30 tablet 2 10/08/2016  . warfarin (COUMADIN) 5 MG tablet Take 1 tablet (5 mg total) by mouth daily. 30 tablet 6 10/10/2016 at 0800   Scheduled: . allopurinol  300 mg Oral Daily  . calcium chloride  IV  1 g Intravenous Once  . febuxostat  40 mg Oral Daily  . feeding supplement  1 Container Oral TID BM  . feeding supplement (ENSURE ENLIVE)  237 mL Oral BID BM  . hydrocortisone sod succinate (SOLU-CORTEF)  inj  50 mg Intravenous Q8H  . pantoprazole (PROTONIX) IV  40 mg Intravenous Q24H  . piperacillin-tazobactam (ZOSYN)  IV  3.375 g Intravenous Q8H  . simvastatin  10 mg Oral QHS  . vancomycin  1,000 mg Intravenous Q24H   Continuous: . sodium chloride    . dextrose 10 % 1,000 mL with sodium bicarbonate 150 mEq infusion 75 mL/hr at 10/20/16 0459  . DOPamine 5 mcg/kg/min (10/13/16 1810)  . norepinephrine (LEVOPHED) Adult infusion 40 mcg/min (2016/10/20 0257)  . phenylephrine (NEO-SYNEPHRINE) Adult infusion 220 mcg/min (10/20/16 0532)   JFT:[id number] **OR** acetaminophen, ALPRAZolam, ipratropium-albuterol, morphine injection, nitroGLYCERIN, ondansetron **OR** ondansetron (ZOFRAN) IV, polyvinyl alcohol, senna-docusate, traMADol  Assesment: He is critically ill. He was admitted with acute kidney injury and GI symptoms. He suffered cardiopulmonary arrest. He required CPR and a fairly prolonged resuscitative effort. He is still relatively hypotensive on 3 different pressors. Neurologically I don't think it's clear where he is. He has acute kidney injury and his creatinine is now about 4.5. He has likely infarcted his liver or at least has had hypoperfusion of his liver and his liver enzymes are very elevated. He has had neutropenia and presumed DIC. He is still on mechanical ventilation. I think the chances that he will survive are very small. Active Problems:   Dyslipidemia   Essential hypertension, benign    Implantable cardioverter-defibrillator (ICD) in situ   Chronic kidney disease (CKD), stage II (mild)   Chronic systolic CHF (congestive heart failure) (HCC)   Diarrhea   SIRS (systemic inflammatory response syndrome) (HCC)   Leukopenia   AKI (acute kidney injury) ([place])   Cardiac arrest (HCC)   Chemotherapy-induced neutropenia ([place])    Plan: I think to clear up where he is from the neurological status is reasonable to get consultation from neurology and I have gone ahead and requested that    LOS: 2 days   Briani Maul L 20-Oct-2016, 8:14 AM

## 2016-10-16 NOTE — Discharge Summary (Addendum)
Death summary  Patient was a 66 year old man initially admitted to the hospital on 1/28 with diarrhea and generalized weakness following chemotherapy treatment for non-small cell lung cancer. Night of admission he coded twice, achieved return of spontaneous circulation transferred to the ICU and placed on the ventilator. He developed multiorgan dysfunction syndrome with shock requiring 3 pressors, shock liver, acute renal failure, signs of anoxic brain injury, profound hypoglycemia. He was subsequently made a DO NOT RESUSCITATE after multiple family discussions. He then expired on October 16, 2016 at 2:50 PM.  Causes of death Shock, presumed septic versus cardiogenic Multiorgan dysfunction syndrome Non-small cell lung cancer  Domingo Mend, MD [facility] Pager: [phone]

## 2016-10-16 NOTE — Progress Notes (Addendum)
Called by RN to bedside with concerns for agonal breathing. Upon arrival patient was noted to be markedly hypotensive with a systolic blood pressure in the 40s, fluid bolus initiated, despite this patient quickly lost pulse. Patient was pronounced dead at 2:50 PM. Magnet placed over pacer defibrillator, [employer] has been called to formally disconnect device. Respirator has been disconnected. Family at bedside.  Domingo Mend, MD [facility] Pager: [phone]

## 2016-10-16 NOTE — Progress Notes (Addendum)
PROGRESS NOTE    Adam House  DVV:[id number] DOB: Feb 01, 1951 DOA: 09/30/2016 PCP: Glenda Chroman, MD     Brief Narrative:  66 year old man admitted from home on 1/28 after being transferred from [facility] ER due to diarrhea and generalized weakness. He has a history of non-small cell lung cancer and is currently undergoing chemotherapy. Unfortunately the same night of admission he was found to be unresponsive with PEA arrest, after 20 minutes achieved return of spontaneous circulation however later on in the night coded again second time. During this code he was initially in PEA, later in V. fib for which he received a shock, later was found to be with unstable ventricular tachycardia for which she was shocked again. This time he achieved return of spontaneous circulation after about 15 minutes. Has been transferred to the ICU, remains critically ill in shock still on 3 pressors, with multiorgan dysfunction syndrome.   Assessment & Plan:   Active Problems:   Dyslipidemia   Essential hypertension, benign   Implantable cardioverter-defibrillator (ICD) in situ   Chronic kidney disease (CKD), stage II (mild)   Chronic systolic CHF (congestive heart failure) (HCC)   Diarrhea   SIRS (systemic inflammatory response syndrome) (HCC)   Leukopenia   AKI (acute kidney injury) ([place])   Cardiac arrest (HCC)   Chemotherapy-induced neutropenia (HCC)   Cardiac arrest/multiorgan dysfunction syndrome -He remains critically ill at this time, in shock on 3 pressors (Levophed, norepinephrine, dopamine). Etiology of shock is unclear, could be septic although no clear source of infection (continue broad-spectrum antibiotic therapy, culture data requested), could be cardiogenic given his known decreased EF of 20% appear to be the most likely possibilities. Unlikely to be hypovolemic, central line is currently being placed, after that we'll order CVP to assess volume status. -Also has multiorgan dysfunction  with shock liver, acute renal failure due to ATN, neutropenia which is likely chemotherapy-induced and present prior to cardiac arrest, signs of DIC with an elevated d-dimer, elevated INR and thrombocytopenia. Also with anemia, he is status post 2 units of PRBCs, 1 unit of FFP and 1 unit of platelets. Has also been continuously hypoglycemic, recurrently have him on a D10 drip. Continue hydrocortisone given shock to support adrenal function. -Also has some signs of anoxic brain injury, left pupil not very responsive to light without corneal reflex and unable to spontaneously move left side. Patient was seen in consultation by Dr. Merlene Laughter, neurology, he was able to follow some commands so we are hopeful that there is some remaining brain function. -Family is very realistic about patient's poor prognosis, they understand his chances of survival are poor, they request however that we keep him on the ventilator to allow for family from out of state to trickle in to see him. Have discussed with them transfer to [facility] to be under the critical care service. This is not something that they're willing to entertain at this time.   DVT prophylaxis: Patient is coagulopathic Code Status: DO NOT RESUSCITATE Family Communication: Multiple family members at bedside, main contact is daughter Disposition Plan: Keep in ICU  Consultants:   Pulmonology  Neurology  Oncology  Nephrology, requested  Procedures:   None  Antimicrobials:  Anti-infectives    Start     Dose/Rate Route Frequency Ordered Stop   10/15/16 2100  vancomycin (VANCOCIN) IVPB 1000 mg/200 mL premix     1,000 mg 200 mL/hr over 60 Minutes Intravenous Every 48 hours Nov 05, 2016 1054     11/05/2016 1400  piperacillin-tazobactam (  ZOSYN) 2.25 g in dextrose 5 % 50 mL IVPB     2.25 g 100 mL/hr over 30 Minutes Intravenous Every 8 hours 10/25/16 1056     10/13/16 2200  vancomycin (VANCOCIN) IVPB 1000 mg/200 mL premix  Status:  Discontinued      1,000 mg 200 mL/hr over 60 Minutes Intravenous Every 24 hours 10/13/16 0814 Oct 25, 2016 1054   10/13/16 1400  piperacillin-tazobactam (ZOSYN) IVPB 3.375 g  Status:  Discontinued     3.375 g 12.5 mL/hr over 240 Minutes Intravenous Every 8 hours 10/13/16 0814 25-Oct-2016 1056   10/13/16 0245  vancomycin (VANCOCIN) IVPB 1000 mg/200 mL premix     1,000 mg 200 mL/hr over 60 Minutes Intravenous  Once 10/13/16 0232 10/13/16 0631   10/13/16 0215  piperacillin-tazobactam (ZOSYN) IVPB 3.375 g     3.375 g 12.5 mL/hr over 240 Minutes Intravenous  Once 10/13/16 0212 10/13/16 0931   10/03/2016 1330  piperacillin-tazobactam (ZOSYN) IVPB 3.375 g     3.375 g 100 mL/hr over 30 Minutes Intravenous  Once 09/30/2016 1325 10/06/2016 1424       Subjective: Intubated  Objective: Vitals:   25-Oct-2016 1000 Oct 25, 2016 1130 Oct 25, 2016 1145 Oct 25, 2016 1237  BP: 110/73  102/70   Pulse: 96 (!) 115 99   Resp: (!) 32 (!) 35 (!) 23   Temp:  99.1 F (37.3 C)    TempSrc:  Rectal    SpO2: 97% (!) 86% 96% 96%  Weight:      Height:        Intake/Output Summary (Last 24 hours) at October 25, 2016 1321 Last data filed at 10-25-16 0825  Gross per 24 hour  Intake          1568.12 ml  Output              175 ml  Net          1393.12 ml   Filed Weights   10/13/2016 1215 10/03/2016 1716 2016/10/25 0500  Weight: 77.1 kg (170 lb) 80.7 kg (177 lb 14.6 oz) 92.5 kg (203 lb 14.8 oz)    Examination:  General exam: Intubated Respiratory system: Coarse bilateral breath sounds Cardiovascular system:RRR. No murmurs, rubs, gallops. Gastrointestinal system: Abdomen is nondistended, soft and nontender. No organomegaly or masses felt. Hypoactive bowel sounds heard. Central nervous system: On exam appears to have a right brain injury with left-sided hemiparesis, absence of corneal reflex and a nonresponsive pupil Extremities: No C/C/E, +pedal pulses Skin: Appears to have mottling of bilateral lower extremities Psychiatry: Unable to assess as he is  currently intubated     Data Reviewed: I have personally reviewed following labs and imaging studies  CBC:  Recent Labs Lab 10/13/16 0116 10/13/16 0341 10/13/16 1412 10/25/2016 0920  WBC 0.8* 0.5* 0.4* 0.9*  NEUTROABS 0.0*  --   --   --   HGB 5.8* 6.1* 12.2* 12.1*  HCT 16.9* 17.8* 33.9* 33.0*  MCV 81.3 80.5 79.8 78.8  PLT 14* 18* 43* 27*   Basic Metabolic Panel:  Recent Labs Lab 10/13/16 0116 10/13/16 0341 10/13/16 1412 10/13/16 1425 10/13/16 1659 10/13/16 1903 Oct 25, 2016 0524  NA 122* 133*  --  138 139  --  136  K 6.4* 5.5*  --  3.8 4.4  --  4.7  CL 95* 101  --  101 102  --  96*  CO2 11* 14*  --  17* 18*  --  21*  GLUCOSE 1,168* 558*  --  114* 91 202* 108*  BUN 46*  46*  --  47* 47*  --  54*  CREATININE 3.07* 3.16*  --  3.80* 3.78*  --  4.75*  CALCIUM 7.5* 6.5*  --  6.7* 6.4*  --  5.8*  MG 1.8  --   --   --   --   --   --   PHOS 11.2*  --  6.2*  --   --   --   --    GFR: Estimated Creatinine Clearance: 17.4 mL/min (by C-G formula based on SCr of 4.75 mg/dL (H)). Liver Function Tests:  Recent Labs Lab 10/13/16 0116 10/13/16 0341 11-10-2016 0524  AST 915* 2,200* 4,833*  ALT 687* 1,029* 2,141*  ALKPHOS 26* 25* 73  BILITOT 0.6 1.0 4.3*  PROT <3.0* <3.0* 4.0*  ALBUMIN 1.2* 1.2* 1.9*   No results for input(s): LIPASE, AMYLASE in the last 168 hours. No results for input(s): AMMONIA in the last 168 hours. Coagulation Profile:  Recent Labs Lab 10/01/2016 1859 10/13/16 0116 Nov 10, 2016 0524  INR 1.61 4.09* 3.79   Cardiac Enzymes: No results for input(s): CKTOTAL, CKMB, CKMBINDEX, TROPONINI in the last 168 hours. BNP (last 3 results) No results for input(s): PROBNP in the last 8760 hours. HbA1C: No results for input(s): HGBA1C in the last 72 hours. CBG:  Recent Labs Lab 10/13/16 1648 10/13/16 1814 10/13/16 1819 10/13/16 1929 11-10-16 1139  GLUCAP <10* 13* 12* 23* 76   Lipid Profile: No results for input(s): CHOL, HDL, LDLCALC, TRIG, CHOLHDL, LDLDIRECT  in the last 72 hours. Thyroid Function Tests: No results for input(s): TSH, T4TOTAL, FREET4, T3FREE, THYROIDAB in the last 72 hours. Anemia Panel: No results for input(s): VITAMINB12, FOLATE, FERRITIN, TIBC, IRON, RETICCTPCT in the last 72 hours. Urine analysis:    Component Value Date/Time   COLORURINE YELLOW 10/13/2016 0330   APPEARANCEUR CLEAR 10/13/2016 0330   LABSPEC 1.015 10/13/2016 0330   PHURINE 5.5 10/13/2016 0330   GLUCOSEU NEGATIVE 10/13/2016 0330   HGBUR LARGE (A) 10/13/2016 0330   BILIRUBINUR NEGATIVE 10/13/2016 0330   KETONESUR NEGATIVE 10/13/2016 0330   PROTEINUR TRACE (A) 10/13/2016 0330   NITRITE NEGATIVE 10/13/2016 0330   LEUKOCYTESUR NEGATIVE 10/13/2016 0330   Sepsis Labs: '@LABRCNTIP'$ (procalcitonin:4,lacticidven:4)  ) Recent Results (from the past 240 hour(s))  MRSA PCR Screening     Status: None   Collection Time: 10/13/16  3:31 AM  Result Value Ref Range Status   MRSA by PCR NEGATIVE NEGATIVE Final    Comment:        The GeneXpert MRSA Assay (FDA approved for NASAL specimens only), is one component of a comprehensive MRSA colonization surveillance program. It is not intended to diagnose MRSA infection nor to guide or monitor treatment for MRSA infections.          Radiology Studies: Dg Chest Port 1 View  Result Date: 11/10/16 CLINICAL DATA:  Respiratory failure. EXAM: PORTABLE CHEST 1 VIEW COMPARISON:  10/13/2016. FINDINGS: Endotracheal tube, right PICC line stable position. Cardiac pacer stable position. Cardiomegaly mild bilateral from interstitial prominence and bilateral pleural effusions consistent with congestive heart failure. No pneumothorax. IMPRESSION: 1. Lines and tubes in stable position. 2. Cardiac pacer stable position. Cardiomegaly with bilateral from interstitial prominence and bilateral pleural effusions consistent with congestive heart failure . Electronically Signed   By: Marcello Moores  Register   On: Nov 10, 2016 09:12   Dg Chest  Port 1 View  Result Date: 10/13/2016 CLINICAL DATA:  66 year old male status post CPR. History of sepsis. EXAM: PORTABLE CHEST 1 VIEW COMPARISON:  Chest  x-ray 10/13/2016. FINDINGS: An endotracheal tube is in place with tip 3.8 cm above the carina. There is a right upper extremity PICC with tip terminating in the mid superior vena cava. Left-sided pacemaker/AICD with lead tips projecting over the expected location of the right atrium and right ventricular apex. There is cephalization of the pulmonary vasculature, indistinctness of the interstitial markings, and patchy airspace disease throughout the lungs bilaterally suggestive of moderate pulmonary edema. Low lung volumes. Mild cardiomegaly. Upper mediastinal contours are within normal limits. No definite pneumothorax. Orthopedic fixation hardware in the lower cervical spine. Electronic device projecting over the lower right hemithorax. IMPRESSION: 1. Support apparatus, as above. 2. The appearance the chest suggests mild to moderate congestive heart failure. Electronically Signed   By: Vinnie Langton M.D.   On: 10/13/2016 10:19   Dg Chest Port 1 View  Result Date: 10/13/2016 CLINICAL DATA:  Endotracheal tube placement. EXAM: PORTABLE CHEST 1 VIEW COMPARISON:  Chest from acute abdomen series yesterday. FINDINGS: Endotracheal tube 3.9 cm from the carina. Tip of the right central line in mid SVC. 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 The heart is enlarged. Bilateral lung base interstitial opacities again seen. Increasing opacities throughout the right lung and left infrahilar region. Left lower lobe pulmonary nodule on prior PET-CT not visualized radiographically. No definite pleural fluid. No pneumothorax. IMPRESSION: 1. Endotracheal tube 3.9 cm from the carina. 2. Stable cardiomegaly. Increasing opacities throughout the right lung and left infrahilar region, favor vascular congestion over aspiration or pneumonia. Electronically Signed   By: Jeb Levering M.D.   On: 10/13/2016 02:32        Scheduled Meds: . allopurinol  300 mg Oral Daily  . calcium chloride  IV  1 g Intravenous Once  . febuxostat  40 mg Oral Daily  . feeding supplement  1 Container Oral TID BM  . feeding supplement (ENSURE ENLIVE)  237 mL Oral BID BM  . hydrocortisone sod succinate (SOLU-CORTEF) inj  50 mg Intravenous Q8H  . pantoprazole (PROTONIX) IV  40 mg Intravenous Q24H  . piperacillin-tazobactam (ZOSYN)  IV  2.25 g Intravenous Q8H  . simvastatin  10 mg Oral QHS  . [START ON 10/15/2016] vancomycin  1,000 mg Intravenous Q48H   Continuous Infusions: . sodium chloride    . dextrose 10 % 1,000 mL with sodium bicarbonate 150 mEq infusion 75 mL/hr at 2016/10/15 0459  . DOPamine 5 mcg/kg/min (10/13/16 1810)  . fentaNYL infusion INTRAVENOUS    . norepinephrine (LEVOPHED) Adult infusion 40 mcg/min (2016/10/15 0947)  . phenylephrine (NEO-SYNEPHRINE) Adult infusion 220 mcg/min (2016-10-15 0946)     LOS: 2 days    Critical care time spent: 85. Greater than 50% of this time was spent in direct contact with the patient coordinating care.     Lelon Frohlich, MD [facility] Pager [phone]  If 7PM-7AM, please contact night-coverage [email or web address] Password [email or web address] 10-15-2016, 1:21 PM

## 2016-10-16 NOTE — Progress Notes (Signed)
Without good explanation for PEA, I have ordered a CT angio of chest to evaluate for PE.  I have also ordered a D-Dimer.  Orders are in.  Patient and plan discussed with Dr. Twana First and she is in agreement with the aforementioned.   KEFALAS,THOMAS, PA-C 11-04-2016 11:08 AM

## 2016-10-16 NOTE — Progress Notes (Signed)
Patient is seen prior to chemotherapy in brief.  Due to emergency of referral for small cell lung cancer with delay in referral and delay in start of treatment due to weather and difficulty with PICC placement, patient met with nursing, including nurse navigator who provided chemotherapy teaching per protocol.  Patient reviewed chemotherapy teaching and signed consent to start Cisplatin/Etoposide.  Patient was scheduled for Nadir check with labs on 10/13/2016 at 8:50 AM with Kirby Crigler, PA-C.

## 2016-10-16 NOTE — Progress Notes (Signed)
In addition to Tom's comments above, patient met in brief. Patient with new diagnosis limited stage SCLC. Complaints of CP/SOB. Follows with Dr. Jacinta Shoe and pulmonary in [place]. CKD, Given limited stage disease discussed with pharmacy cisplatin dosing and dose reduced to 60 mg/m2.  Patient education provided. Patient was set up for formal appointment with Tom on 1/29 as weather made start date of 18th with visit delayed. And Picc placement delayed.  Donald Pore MD

## 2016-10-16 NOTE — Progress Notes (Signed)
Pharmacy Antibiotic Note  Adam House is a 66 y.o. male admitted on 09/29/2016 with sepsis.  Pharmacy has been consulted for [place] dosing. Scr worsening, adjust abx. Patient is critically ill and remains on pressors.  Plan:  Change Vancomycin '1000mg'$  IV q48h Check trough at steady state Change Zosyn 2.25gm IV q8h Monitor labs, renal fxn, progress and c/s Deescalate ABX when improved / appropriate.    Height: '5\' 9"'$  (175.3 cm) Weight: 203 lb 14.8 oz (92.5 kg) IBW/kg (Calculated) : 70.7  Temp (24hrs), Avg:97.8 F (36.6 C), Min:95.8 F (35.4 C), Max:100.9 F (38.3 C)   Recent Labs Lab 09/16/2016 1359 10/13/16 0116 10/13/16 0341 10/13/16 1412 10/13/16 1425 10/13/16 1430 10/13/16 1659 2016/10/20 0524 2016/10/20 0920  WBC  --  0.8* 0.5* 0.4*  --   --   --   --  0.9*  CREATININE  --  3.07* 3.16*  --  3.80*  --  3.78* 4.75*  --   LATICACIDVEN 2.4* 11.6* 12.6*  --   --  11.4*  --   --  9.4*    Estimated Creatinine Clearance: 17.4 mL/min (by C-G formula based on SCr of 4.75 mg/dL (H)).    Allergies  Allergen Reactions  . Lisinopril Anaphylaxis and Swelling    angioedema  . Naproxen Hives  . Prednisone Nausea And Vomiting and Other (See Comments)    "I felt like my stomach was in knots, very painful"   Antimicrobials this admission: Vanc 1/29 >>  Zosyn 1/28 >>   Dose adjustments this admission:  Microbiology results:   1/29 MRSA PCR: negative  Thank you for allowing pharmacy to be a part of this patient's care.  Isac Sarna, BS Pharm D, BCPS Clinical Pharmacist Pager [phone] 20-Oct-2016 10:45 AM

## 2016-10-16 NOTE — Progress Notes (Signed)
RN called to ask (per family's request) that RT  remove ETT as patient was a DNR and had expired.

## 2016-10-16 NOTE — Progress Notes (Signed)
Patient having a procedure done and not available for EEG. EEG will be completed 10/16/15 as schedule permits

## 2016-10-16 NOTE — Progress Notes (Signed)
  After central line placement, all lines changed and drips connected to new access- Okay to use per vascular access RN. Pt  Appeared to be agonal breathing, non-responsive to painful stimuli, HR 60's, MD Jerilee Hoh paged to assess patient condition.

## 2016-10-16 NOTE — Progress Notes (Signed)
ANTICOAGULATION CONSULT NOTE - follow up  Pharmacy Consult for COUMADIN (home med) Indication: atrial fibrillation  Allergies  Allergen Reactions  . Lisinopril Anaphylaxis and Swelling    angioedema  . Naproxen Hives  . Prednisone Nausea And Vomiting and Other (See Comments)    "I felt like my stomach was in knots, very painful"   Patient Measurements: Height: '5\' 9"'$  (175.3 cm) Weight: 203 lb 14.8 oz (92.5 kg) IBW/kg (Calculated) : 70.7  Vital Signs: Temp: 100.9 F (38.3 C) (01/30 0730) BP: 110/73 (01/30 1000) Pulse Rate: 96 (01/30 1000)  Labs:  Recent Labs  10/01/2016 1859  10/13/16 0116 10/13/16 0341 10/13/16 1412 10/13/16 1425 10/13/16 1659 11-04-2016 0524 Nov 04, 2016 0920  HGB  --   < > 5.8* 6.1* 12.2*  --   --   --  12.1*  HCT  --   < > 16.9* 17.8* 33.9*  --   --   --  33.0*  PLT  --   < > 14* 18* 43*  --   --   --  27*  LABPROT 19.3*  --  40.9*  --   --   --   --  38.3*  --   INR 1.61  --  4.09*  --   --   --   --  3.79  --   CREATININE  --   < > 3.07* 3.16*  --  3.80* 3.78* 4.75*  --   < > = values in this interval not displayed. Estimated Creatinine Clearance: 17.4 mL/min (by C-G formula based on SCr of 4.75 mg/dL (H)).  Medical History: Past Medical History:  Diagnosis Date  . Arthritis    gout  . Asthma   . Atrial fibrillation ([place])   . CAD (coronary artery disease)    a. ant MI 1998 with PCI to LAD. b. Ant-lat MI 04/2012 tx with overlapping DES to LAD.  [name] CHF (congestive heart failure) ([place])   . CRI (chronic renal insufficiency)   . CVA (cerebral infarction) 11/2010  . DVT (deep venous thrombosis) ([place])   . Esophageal reflux   . Gout   . Heart murmur   . Hypercholesteremia   . ICD (implantable cardiac defibrillator) in place   . Impotence of organic origin   . Ischemic cardiomyopathy    EF 25-30% by echo 04/2012; [place]  . Lung disease, interstitial ([place])   . Myocardial infarction 1998 and 2005  . Peripheral arterial disease ([place])    . Pneumonia    hx  . 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 Right internal carotid occlusion   . Stroke [facility]) ? 2006  . Systolic CHF ([facility])    NYHA Class II/III  . Tobacco use disorder   . Unspecified essential hypertension    Medications:  Prescriptions Prior to Admission  Medication Sig Dispense Refill Last Dose  . albuterol (PROVENTIL HFA;VENTOLIN HFA) 108 (90 Base) MCG/ACT inhaler Inhale 2 puffs into the lungs every 6 (six) hours as needed for wheezing or shortness of breath. 1 Inhaler 1 10/11/2016 at Unknown time  . allopurinol (ZYLOPRIM) 300 MG tablet Take 300 mg by mouth daily.  4 Past Week at Unknown time  . ALPRAZolam (XANAX) 0.5 MG tablet Take 0.5 mg by mouth 2 (two) times daily as needed for anxiety.   10/03/2016 at Unknown time  . colchicine 0.6 MG tablet Take 0.6 mg by mouth daily.   10/11/2016 at Unknown time  . ETOPOSIDE IV Inject into the  vein. Days 1-3 every 21 days   10/08/2016 at Unknown time  . furosemide (LASIX) 20 MG tablet Take 1 tablet (20 mg total) by mouth daily. 90 tablet 3 10/11/2016 at Unknown time  . ipratropium (ATROVENT) 0.02 % nebulizer solution Take 0.5 mg by nebulization every 6 (six) hours as needed for wheezing or shortness of breath.   Past Month at Unknown time  . isosorbide mononitrate (IMDUR) 30 MG 24 hr tablet Take 1 tablet (30 mg total) by mouth daily. 90 tablet 3 10/11/2016 at Unknown time  . omeprazole (PRILOSEC) 20 MG capsule Take 20 mg by mouth daily.  2 10/11/2016 at Unknown time  . simvastatin (ZOCOR) 10 MG tablet Take 1 tablet (10 mg total) by mouth at bedtime. 90 tablet 3 10/11/2016 at Unknown time  . sotalol (BETAPACE) 120 MG tablet Take 120 mg by mouth 2 (two) times daily.  4 10/11/2016 at Unknown time  . ULORIC 40 MG tablet Take 1 tablet by mouth daily.  3 10/11/2016 at Unknown time  . NITROSTAT 0.4 MG SL tablet PLACE 1 TABLET UNDER TONGUE EVERY 5 MINUTES AS NEEDED FOR CHEST PAIN (UP TO 3 DOSES) 25 tablet 3 unknown  . ondansetron (ZOFRAN) 8 MG  tablet Take 1 tablet (8 mg total) by mouth every 8 (eight) hours as needed for nausea or vomiting. 30 tablet 2 10/01/2016  . Pegfilgrastim (NEULASTA ONPRO [place]) Inject into the skin. Every 21 days - to be administered 27 hours post chemo completion   10/08/2016  . potassium chloride (K-DUR) 10 MEQ tablet Take 10 mEq by mouth 2 (two) times daily.   unknown  . prochlorperazine (COMPAZINE) 10 MG tablet Take 1 tablet (10 mg total) by mouth every 6 (six) hours as needed for nausea or vomiting. 30 tablet 2 10/08/2016  . warfarin (COUMADIN) 5 MG tablet Take 1 tablet (5 mg total) by mouth daily. 30 tablet 6 10/10/2016 at 0800   Assessment: 66yo male on chronic Coumadin for h/o afib.  INR SUBtherapeutic on admission but has now trended up to > 4. INR remains elevated.   Home dose listed above, last dose taken reportedly 1/26.  H/H, PLTs low.  Goal of Therapy:  INR 2-3 Monitor platelets by anticoagulation protocol: Yes   Plan:  HOLD coumadin, allow INR to trend back down INR daily Monitor CBC, s/sx bleeding complications  Isac Sarna, BS Vena [place], BCPS Clinical Pharmacist Pager [phone] Nov 03, 2016,10:58 AM

## 2016-10-16 NOTE — Progress Notes (Signed)
Wasted  Fentanyl bag 2,500 mcg in sodium chloride 0.9% 267m in sink. Witnessed by JEvelena Peat RIrwindalenotified

## 2016-10-16 DEATH — deceased

## 2016-10-17 ENCOUNTER — Inpatient Hospital Stay (HOSPITAL_COMMUNITY): Admission: RE | Admit: 2016-10-17 | Payer: Medicare Other | Source: Ambulatory Visit

## 2016-10-17 ENCOUNTER — Ambulatory Visit (HOSPITAL_COMMUNITY): Payer: Medicare Other

## 2016-10-22 ENCOUNTER — Ambulatory Visit (HOSPITAL_COMMUNITY): Payer: Medicare Other | Admitting: Hematology & Oncology

## 2016-10-22 ENCOUNTER — Ambulatory Visit (HOSPITAL_COMMUNITY): Payer: Medicare Other | Admitting: Adult Health

## 2016-10-22 ENCOUNTER — Ambulatory Visit (HOSPITAL_COMMUNITY): Payer: Medicare Other

## 2016-10-23 ENCOUNTER — Ambulatory Visit (HOSPITAL_COMMUNITY): Payer: Medicare Other

## 2016-10-24 ENCOUNTER — Ambulatory Visit (HOSPITAL_COMMUNITY): Payer: Medicare Other

## 2016-11-19 ENCOUNTER — Other Ambulatory Visit: Payer: Self-pay | Admitting: Cardiovascular Disease

## 2016-12-25 ENCOUNTER — Other Ambulatory Visit: Payer: Self-pay | Admitting: Nurse Practitioner

## 2017-01-16 ENCOUNTER — Encounter: Payer: Medicare Other | Admitting: Internal Medicine

## 2017-02-20 ENCOUNTER — Other Ambulatory Visit (HOSPITAL_COMMUNITY): Payer: Self-pay | Admitting: Pharmacist

## 2017-03-30 ENCOUNTER — Ambulatory Visit: Payer: Medicare Other | Admitting: Family

## 2017-03-30 ENCOUNTER — Encounter (HOSPITAL_COMMUNITY): Payer: Medicare Other

## 2018-10-28 IMAGING — CT NM PET TUM IMG INITIAL (PI) SKULL BASE T - THIGH
8 series · 25 of 25 positions shown · non-contrast
Comparison: Chest CT 08/04/2016

CLINICAL DATA: Initial treatment strategy for left lower lobe lung
nodule..

EXAM:
NUCLEAR MEDICINE PET SKULL BASE TO THIGH
TECHNIQUE: 9.5 mCi F-18 FDG was injected intravenously. Full-ring PET imaging
was performed from the skull base to thigh after the radiotracer. CT
data was obtained and used for attenuation correction and anatomic
localization.
FASTING BLOOD GLUCOSE:  Value: 95 mg/dl

[Series 3: pet sk_thigh ac · axial · 5.0mm · 4.07mm/px · z∈[-919,+9]mm · 5 of 233 slices shown]
[im 1/233]
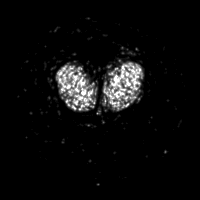
[im 59/233]
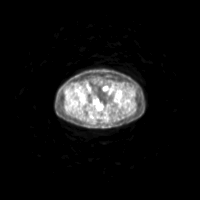
[im 117/233]
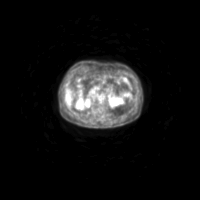
[im 175/233]
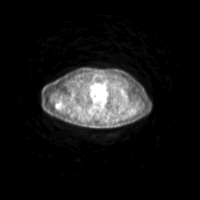
[im 233/233]
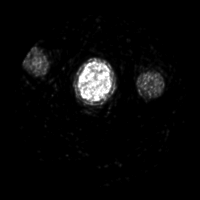

[Series 4: ct sk_thigh 5.0 b31f · axial · 5.0mm · 0.98mm/px · z∈[-919,+9]mm · 5 of 233 slices shown]
[im 1/233]
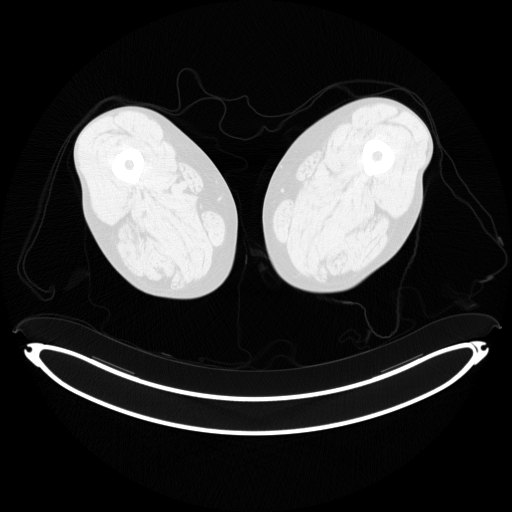
[im 59/233]
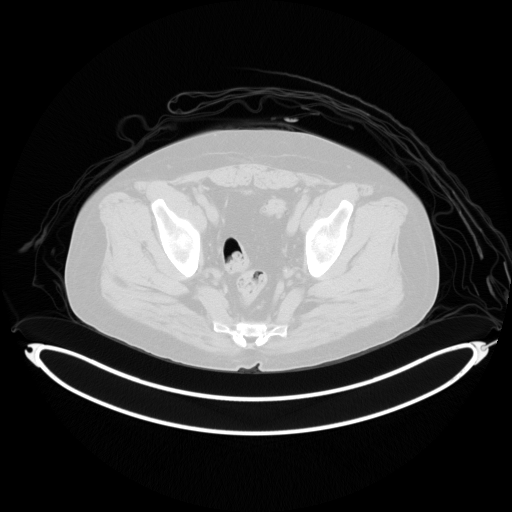
[im 117/233]
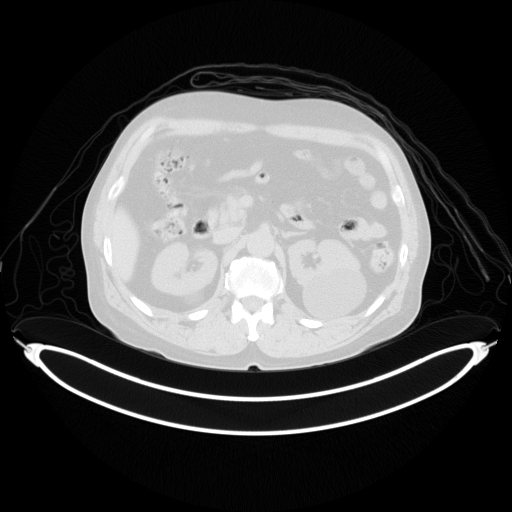
[im 175/233]
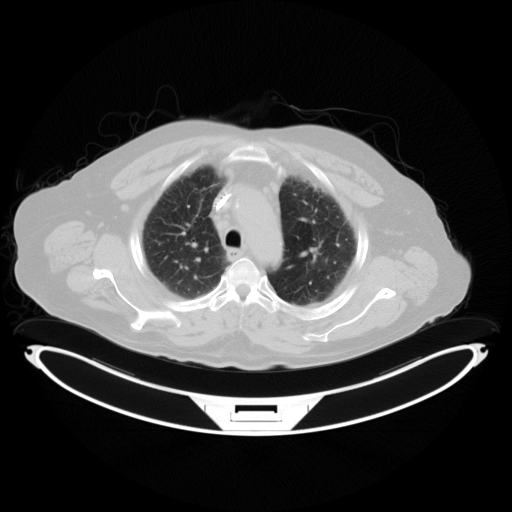
[im 233/233  brain]
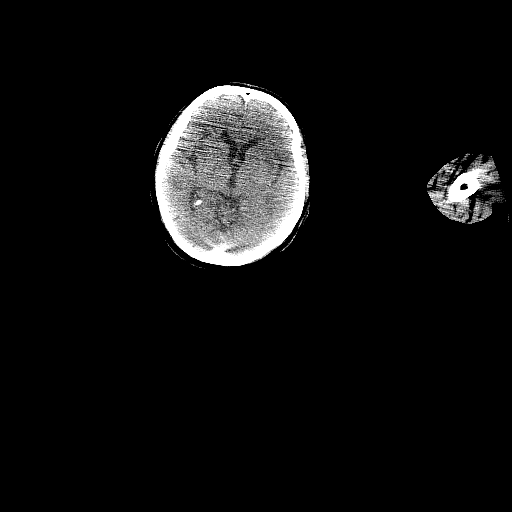

[Series 7: ct sk_thigh 5.0 b70f lung_bone · axial · 5.0mm · 0.72mm/px · z∈[-414,-134]mm · 2 of 71 slices shown]
[im 1/71  bone]
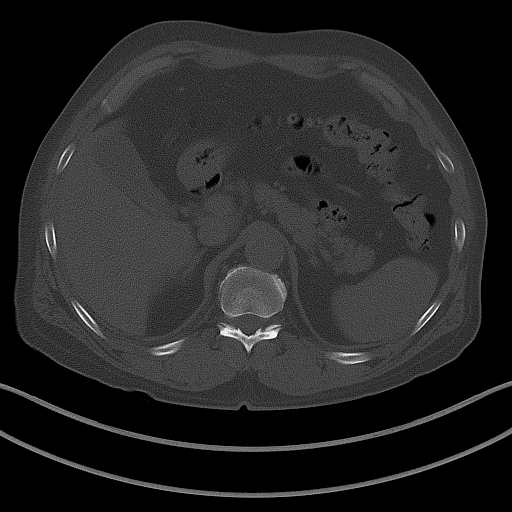
[im 71/71  bone]
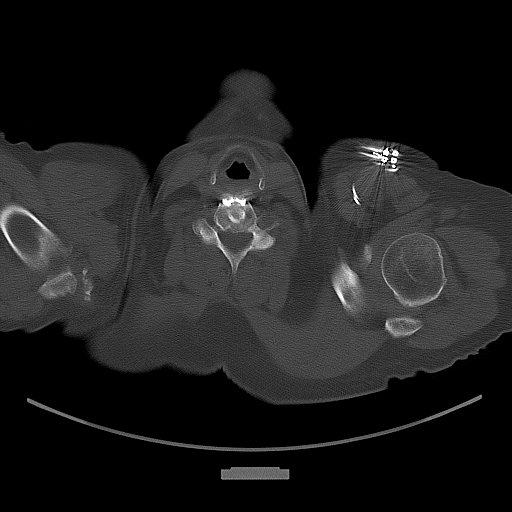

[Series 8: pet sk_thigh nac · axial · 5.0mm · 4.07mm/px · z∈[-919,+9]mm · 5 of 233 slices shown]
[im 1/233]
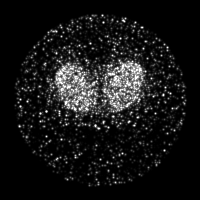
[im 59/233]
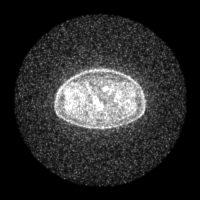
[im 117/233]
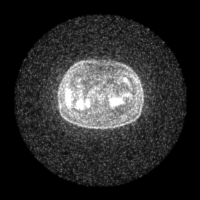
[im 175/233]
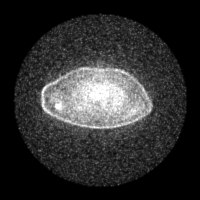
[im 233/233]
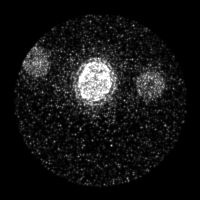

[Series 604: mip collection · coronal · 1.93mm/px · 1 of 32 slices shown]
[im 1/32]
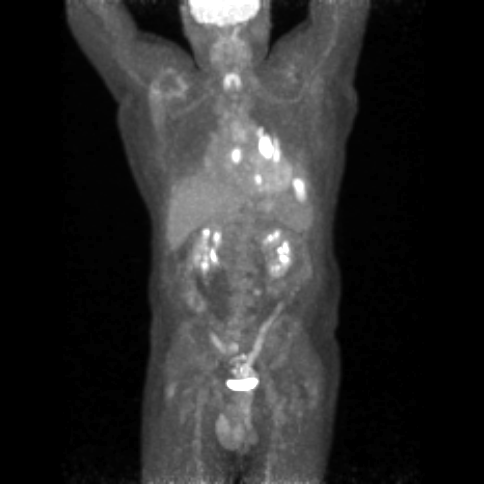

[Series 605: range-ct sk_thigh 5.0 (id)<alpha range> · 1 of 59 slices shown (1 of 2)]
[im 1/59]
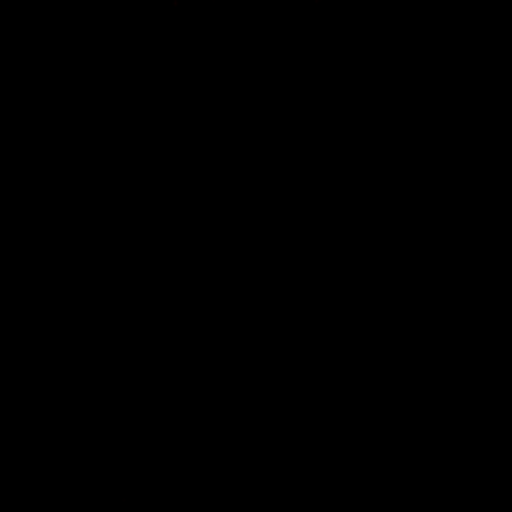

[Series 606: range-ct sk_thigh 5.0 (id)<alpha range> · 5 of 217 slices shown (2 of 2)]
[im 1/217]
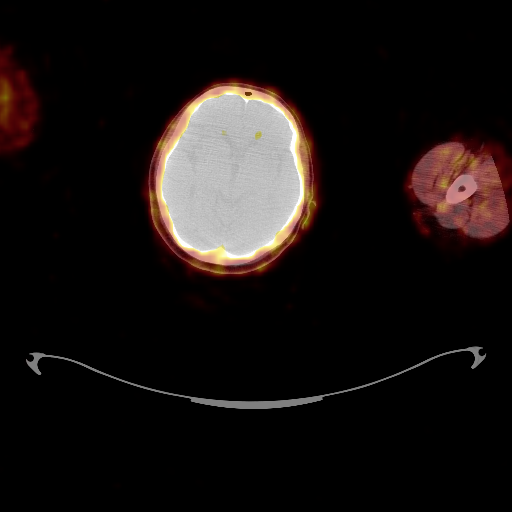
[im 55/217]
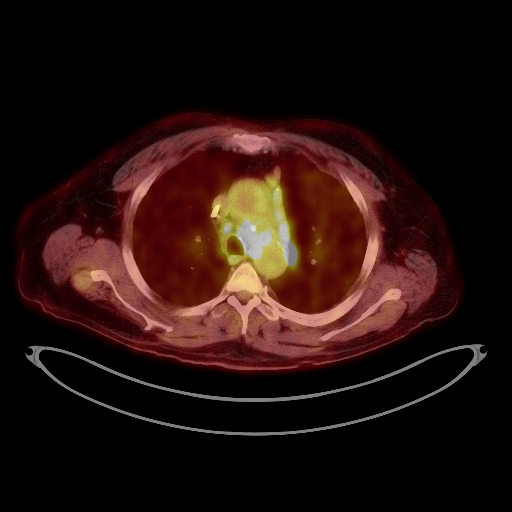
[im 109/217]
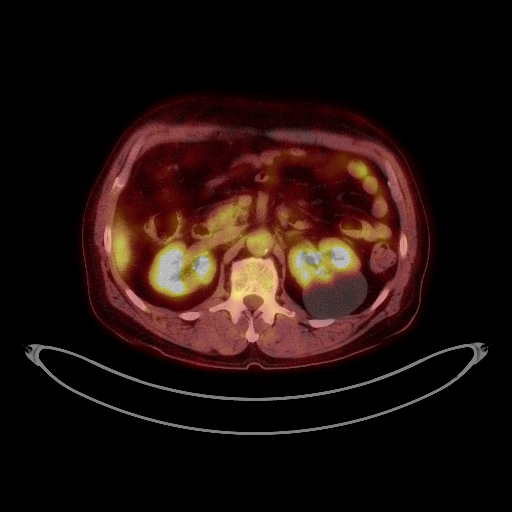
[im 163/217]
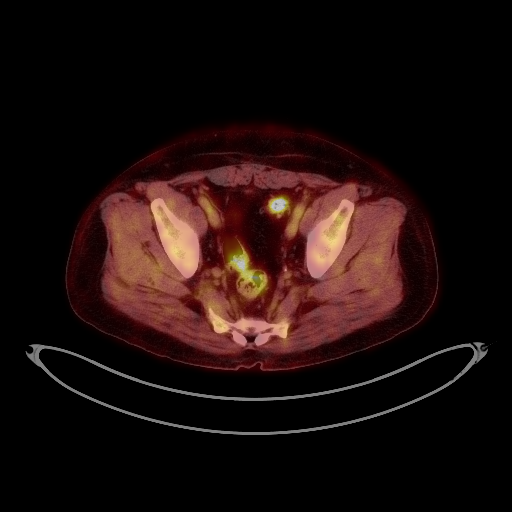
[im 217/217]
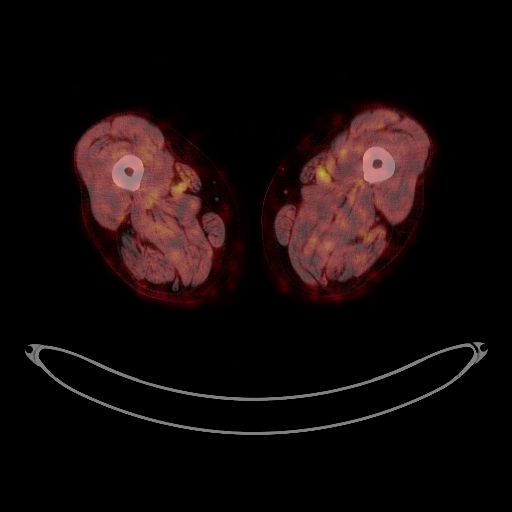

[Series 1032: results mm oncology reading · 5.0mm · 0.77mm/px · 1 of 3 slices shown]
[im 1/3]
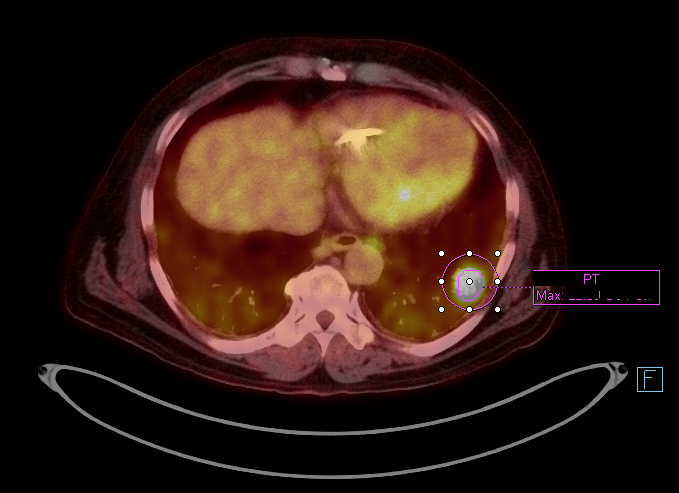

[25 of 25 positions shown; findings below may reference images not displayed]

FINDINGS: NECK

No hypermetabolic lymph nodes in the neck. Symmetric glottic
activity is likely physiologic.

CHEST

[DATE] by 2.7 cm left lower lobe pulmonary nodule, maximum SUV 22.7.
There is hypermetabolic left infrahilar, subcarinal, hilar, and
paraesophageal adenopathy, including a posterior left hilar lymph
node measuring 2.9 cm in short axis on image 73 of series 4 with
maximum SUV 33.5; and a left paraesophageal node measuring 0.9 cm in
short axis with maximum SUV 17.8. There is also some faintly
hypermetabolic AP window adenopathy.

Low-grade and likely inflammatory activity corresponding to the
abnormal ground-glass opacities and interstitial accentuation in the
lung bases.

Coronary, aortic arch, and branch vessel atherosclerotic vascular
disease.

ABDOMEN/PELVIS

Photopenic cysts in the kidneys. A complex left kidney lower pole
anterior exophytic lesion essentially isodense to the rest of the
kidney with density 27 Hounsfield units appears to be photopenic and
is likely a complex cyst.

Dependent density in the gallbladder compatible with small
gallstones.

Aortoiliac atherosclerotic vascular disease. Infrarenal abdominal
aortic ectasia at 2.9 cm. Sigmoid diverticulosis.

SKELETON

No focal hypermetabolic activity to suggest skeletal metastasis.
Small focus activity along the left L3-4 intervertebral level with
maximum SUV 6.0, likely due to spurring.
IMPRESSION: 1. 2.8 cm left lower lobe pulmonary nodule, SUV 22.7. Hypermetabolic
left infrahilar, subcarinal, hilar, and paraesophageal adenopathy.
Assuming non-small cell lung cancer, this represents T1b N2 M0
disease (stage IIIA).
2. Low-grade inflammatory activity associated with the ground-glass
opacities and interstitial accentuation in the lung bases.
3. Coronary, aortic arch, and branch vessel atherosclerotic vascular
disease. Aortoiliac atherosclerotic vascular disease. Infrarenal
abdominal aortic ectasia.
4. Renal cysts including 1 complex cyst which appears
photopenic/benign.
5. Sigmoid diverticulosis.
6. Small focus of activity on the left at L3-4 is likely
degenerative.

## 2018-11-04 IMAGING — NM NM MYOCAR MULTI W/SPECT W/WALL MOTION & EF
2 series · 12 of 12 positions shown · non-contrast
Comparison: none

[Series 1: rest · 8.28mm/px · 6 of 64 frames shown]
[frame 6/64]
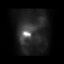
[frame 16/64]
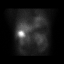
[frame 27/64]
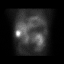
[frame 38/64]
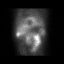
[frame 48/64]
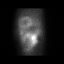
[frame 59/64]
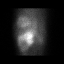

[Series 2: stress gated · 8.28mm/px · 6 of 64 frames shown]
[frame 6/64]
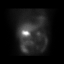
[frame 16/64]
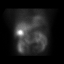
[frame 27/64]
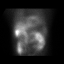
[frame 38/64]
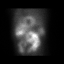
[frame 48/64]
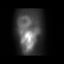
[frame 59/64]
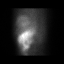

[12 of 12 positions shown; findings below may reference images not displayed]

Canned report from images found in remote index.

Refer to host system for actual result text.
